# Patient Record
Sex: Female | Born: 1965 | Race: White | Hispanic: No | Marital: Married | Smoking: Never smoker
Health system: Southern US, Community
[De-identification: ages and names within clinical notes are randomized; demographics above are authoritative.]

## PROBLEM LIST (undated history)

## (undated) DIAGNOSIS — F410 Panic disorder [episodic paroxysmal anxiety] without agoraphobia: Secondary | ICD-10-CM

## (undated) DIAGNOSIS — I10 Essential (primary) hypertension: Secondary | ICD-10-CM

## (undated) DIAGNOSIS — R569 Unspecified convulsions: Secondary | ICD-10-CM

## (undated) DIAGNOSIS — F419 Anxiety disorder, unspecified: Secondary | ICD-10-CM

## (undated) DIAGNOSIS — F409 Phobic anxiety disorder, unspecified: Secondary | ICD-10-CM

## (undated) DIAGNOSIS — D66 Hereditary factor VIII deficiency: Secondary | ICD-10-CM

## (undated) DIAGNOSIS — J449 Chronic obstructive pulmonary disease, unspecified: Secondary | ICD-10-CM

## (undated) HISTORY — PX: ABDOMINAL HYSTERECTOMY: SHX81

## (undated) HISTORY — PX: OOPHORECTOMY: SHX86

---

## 2001-03-27 ENCOUNTER — Emergency Department (HOSPITAL_COMMUNITY): Admission: EM | Admit: 2001-03-27 | Discharge: 2001-03-28 | Payer: Self-pay | Admitting: *Deleted

## 2003-04-05 ENCOUNTER — Emergency Department (HOSPITAL_COMMUNITY): Admission: EM | Admit: 2003-04-05 | Discharge: 2003-04-05 | Payer: Self-pay | Admitting: Internal Medicine

## 2005-07-23 ENCOUNTER — Inpatient Hospital Stay (HOSPITAL_COMMUNITY): Admission: EM | Admit: 2005-07-23 | Discharge: 2005-07-31 | Payer: Self-pay | Admitting: Emergency Medicine

## 2005-07-24 ENCOUNTER — Ambulatory Visit: Payer: Self-pay | Admitting: *Deleted

## 2005-08-09 ENCOUNTER — Inpatient Hospital Stay (HOSPITAL_COMMUNITY): Admission: EM | Admit: 2005-08-09 | Discharge: 2005-08-14 | Payer: Self-pay | Admitting: Emergency Medicine

## 2006-03-20 ENCOUNTER — Emergency Department (HOSPITAL_COMMUNITY): Admission: EM | Admit: 2006-03-20 | Discharge: 2006-03-20 | Payer: Self-pay | Admitting: Emergency Medicine

## 2006-10-19 ENCOUNTER — Ambulatory Visit (HOSPITAL_COMMUNITY): Admission: RE | Admit: 2006-10-19 | Discharge: 2006-10-19 | Payer: Self-pay | Admitting: Family Medicine

## 2007-04-05 ENCOUNTER — Emergency Department (HOSPITAL_COMMUNITY): Admission: EM | Admit: 2007-04-05 | Discharge: 2007-04-05 | Payer: Self-pay | Admitting: Emergency Medicine

## 2007-04-11 ENCOUNTER — Emergency Department (HOSPITAL_COMMUNITY): Admission: EM | Admit: 2007-04-11 | Discharge: 2007-04-11 | Payer: Self-pay | Admitting: Emergency Medicine

## 2007-04-21 ENCOUNTER — Inpatient Hospital Stay (HOSPITAL_COMMUNITY): Admission: EM | Admit: 2007-04-21 | Discharge: 2007-04-26 | Payer: Self-pay | Admitting: Emergency Medicine

## 2007-04-22 ENCOUNTER — Encounter: Payer: Self-pay | Admitting: Orthopedic Surgery

## 2007-11-16 ENCOUNTER — Emergency Department (HOSPITAL_COMMUNITY): Admission: EM | Admit: 2007-11-16 | Discharge: 2007-11-16 | Payer: Self-pay | Admitting: Emergency Medicine

## 2008-06-10 ENCOUNTER — Encounter: Payer: Self-pay | Admitting: Orthopedic Surgery

## 2008-07-13 ENCOUNTER — Ambulatory Visit: Payer: Self-pay | Admitting: Orthopedic Surgery

## 2008-07-13 DIAGNOSIS — M1710 Unilateral primary osteoarthritis, unspecified knee: Secondary | ICD-10-CM

## 2008-07-13 DIAGNOSIS — IMO0002 Reserved for concepts with insufficient information to code with codable children: Secondary | ICD-10-CM | POA: Insufficient documentation

## 2008-07-13 DIAGNOSIS — M25569 Pain in unspecified knee: Secondary | ICD-10-CM | POA: Insufficient documentation

## 2010-09-12 ENCOUNTER — Emergency Department (HOSPITAL_COMMUNITY)
Admission: EM | Admit: 2010-09-12 | Discharge: 2010-09-12 | Payer: Self-pay | Source: Home / Self Care | Admitting: Emergency Medicine

## 2010-10-16 ENCOUNTER — Encounter: Payer: Self-pay | Admitting: Family Medicine

## 2010-12-05 LAB — URINE MICROSCOPIC-ADD ON

## 2010-12-05 LAB — DIFFERENTIAL
Basophils Absolute: 0 10*3/uL (ref 0.0–0.1)
Lymphocytes Relative: 36 % (ref 12–46)
Monocytes Absolute: 0.4 10*3/uL (ref 0.1–1.0)
Neutro Abs: 2.1 10*3/uL (ref 1.7–7.7)

## 2010-12-05 LAB — COMPREHENSIVE METABOLIC PANEL
Albumin: 3.3 g/dL — ABNORMAL LOW (ref 3.5–5.2)
BUN: 31 mg/dL — ABNORMAL HIGH (ref 6–23)
Chloride: 96 mEq/L (ref 96–112)
Creatinine, Ser: 2.22 mg/dL — ABNORMAL HIGH (ref 0.4–1.2)
GFR calc non Af Amer: 24 mL/min — ABNORMAL LOW (ref >60)
Total Bilirubin: 0.6 mg/dL (ref 0.3–1.2)

## 2010-12-05 LAB — URINALYSIS, ROUTINE W REFLEX MICROSCOPIC
Glucose, UA: >1000 mg/dL — AB
Ketones, ur: NEGATIVE mg/dL
Protein, ur: NEGATIVE mg/dL
Urobilinogen, UA: 0.2 mg/dL (ref 0.0–1.0)

## 2010-12-05 LAB — CBC
MCH: 27.8 pg (ref 26.0–34.0)
MCV: 85.5 fL (ref 78.0–100.0)
Platelets: 152 10*3/uL (ref 150–400)
RBC: 4.06 MIL/uL (ref 3.87–5.11)
RDW: 13.2 % (ref 11.5–15.5)

## 2010-12-05 LAB — POCT PREGNANCY, URINE: Preg Test, Ur: NEGATIVE

## 2010-12-05 LAB — PROTIME-INR: INR: 1.02 (ref 0.00–1.49)

## 2010-12-05 LAB — GLUCOSE, CAPILLARY: Glucose-Capillary: 113 mg/dL — ABNORMAL HIGH (ref 70–99)

## 2010-12-05 LAB — LIPASE, BLOOD: Lipase: 57 U/L (ref 11–59)

## 2011-02-07 NOTE — H&P (Signed)
NAMEMECHEL, [name]               ACCOUNT NO.:  [id number]   MEDICAL RECORD NO.:  [id number]          PATIENT TYPE:  INP   LOCATION:  IC03                          FACILITY:  [facility]   PHYSICIAN:  Osvaldo Shipper, MD     DATE OF BIRTH:  08/27/1966   DATE OF ADMISSION:  04/21/2007  DATE OF DISCHARGE:  LH                              HISTORY & PHYSICAL   PRIMARY CARE PHYSICIAN:  [facility].   PSYCHIATRIST:  Unknown at this time.   ADMISSION DIAGNOSES:  1. Persistent hypoglycemia, likely secondary to excessive insulin in      the setting of acute renal failure.  2. Hypotension likely from dehydration.  3. Acute renal failure likely from prerenal azotemia and dehydration.  4. History of bipolar disorder.  5. History of sleep apnea.  6. History of hypertension.   CHIEF COMPLAINT:  I fell.   HISTORY OF PRESENT ILLNESS:  The patient is a 45 year old Caucasian  female who unfortunately is quite somnolent at this time.  She does wake  up on oral stimuli and after tactile stimulus; however, it is very  difficult to keep her up to obtain any history.  According to her  husband and daughter, who are in the room with her, she apparently fell  last night on both her knees and has been complaining of pain in the  right knee.  The patient apparently has not had other problems in the  last few days.  She was apparently in [facility], admitted from  July 18 to July 21 and was admitted for lethargy, weakness, and ataxia.  The physicians at [facility] felt this was secondary to medications.  She  did have a CT and MRI apparently which did not show any acute  intracranial process.   According to the daughter, this morning the patient was found to have  extremely low blood sugar of 37.  EMS was called, and they detected a  blood sugar of 27, gave her an amp of D50 and brought her into the  hospital.  Here, she did receive 2 ampules of D50, but her blood sugars  have been  consistently dropping down into the 50s and the low 70s.   Once again, entire history is not obtainable at this time because of  patient's mental status.   MEDICATIONS AT HOME:  Please note, this list may not be entirely  accurate, but we did corroborate it with the Discharge Medication List  as well as the H&P from [facility].  She is apparently on:  1. Depakote 2500 mg at bedtime.  2. Klonopin 1 mg 2 times a day and 0.5 mg as needed.  3. Gabapentin 300 mg q.a.m. and 400 mg nightly.  4. Hydrochlorothiazide 25 mg once a day.  5. Toprol XL 50 mg once a day.  6. Oxybutynin 5 mg once a day.  7. Abilify 30 mg once a day.  8. Isosorbide mononitrate 60 mg once a day.  9. Enteric-coated aspirin 81 mg once a day.  10.Calcium 600 mg b.i.d.  11.Glipizide 10 mg b.i.d.  12.Lisinopril 10  mg daily.  13.Januvia 100 mg once a day.  14.Lithium 600 mg nightly.  15.Seroquel 200 mg at bedtime.  16.Lantus insulin 85 units nightly.  17.Multivitamins 1 tablet daily.  18.TriCor 145 mg daily.  19.Pravastatin 40 mg daily.  20.Advair Diskus 250/50 one puff b.i.d.   She is on home O2.   ALLERGIES:  No known drug allergies.   PAST MEDICAL HISTORY:  Positive for:  1. Bipolar disorder.  2. CHF.  3. COPD.  4. Diabetes.  5. Dyslipidemia.  6. Hypertension.  7. Panic attacks.  8. Seizure disorder.  9. Morbid obesity.  10.Obstructive sleep apnea.   PAST SURGICAL HISTORY:  Includes hysterectomy and oophorectomy.   SOCIAL HISTORY:  Lives with her husband and daughter.  Denies smoking,  alcohol use, and illicit drug use.   FAMILY HISTORY:  Positive for diabetes and colon cancer.   REVIEW OF SYSTEMS:  Unable to do because of mental status.   PHYSICAL EXAMINATION:  VITAL SIGNS: Temperature 98.9, blood pressure was  120/21 to begin with, dropping down to 84/62, has improved to 100s/50s  systolic.  Heart rate in the 90s and regular.  Respiratory rate is about  20. Saturation varies from 90-100%  on 2 liters by nasal cannula. She  does tend to become hypoxic at times, especially when she is snoring.  GENERAL:  This is a morbidly obese female, very somnolent, easy to  arouse but goes right back to sleep.  Appears to be in no distress or  discomfort.  HEENT:  No pallor, no icterus.  Mucous membranes moist.  Pupils are  normal size and  reactive.  NECK:  Soft and supple.  No thyromegaly is appreciated.  Neck is fairly  obese, though.  LUNGS:  Clinically clear to auscultation bilaterally.  No wheezes,  rales, or rhonchi.  ABDOMEN:  Obese, nontender, nondistended.  CARDIAC:  S1,  S2 normal, regular.  No murmurs appreciated.  EXTREMITIES:  Left knee has full range of motion.  Right knee I am  unable to flex fully because of pain.  Peripheral pulses difficult to  palpate.  NEUROLOGIC:  When she wakes up, she is able to move her arms and legs,  appears to have good strength, though full neurologic exam is difficult  to do.  Cranial nerves appear to be intact.   STUDIES:  CBC reveals normal white count, normal hemoglobin, normal  platelet count, slight neutrophilia on the differential.  BMET reveals  potassium was normal, bicarb normal, glucose 124 when drawn, BUN 108,  creatinine 6.2.  Her baseline liver functions close to normal.  LFTs are  not available.  Calcium is 9.3.  Lithium level was 1.2 which is in the  therapeutic range.  Valproic acid level is 51 which is also not in the  toxic range.  Urine drug screen positive for benzodiazepines, otherwise  unremarkable.  UA has not been done.   No imaging studies have been done.   ASSESSMENT:  This is a 45 year old Caucasian female, who is morbidly  obese, who has numerous medical problems as stated above, who presents  to the hospital after being found to be hypoglycemic at home.  Blood  sugars have been refractory to multiple D50s.  She was also found to  have acute renal failure, and she has alteration in her mental status.  All  of the above predisposes her to be admitted to the hospital.  She  was also transiently hypotensive a little while ago in the ED.  PLAN:  1. Hypoglycemia.  I will put her on a D10 drip to see if that helps      bring her blood sugar up.  Monitor CBGs q. 1 h for now until      consistently have 3 CBGs more than 150 without assistance from      being given D50.  Once the CBGs are consistently above 150, we can      set her IV fluids.  Obviously, we will hold her antidiabetic      medications.  2. Hypotension, likely from dehydration.  There is no evidence for any      blood loss.  We will check cardiac enzymes.  We will check a chest      x-ray also.  Give her some fluid boluses to see if the blood      pressure will stay up.  We will monitor in the ICU at least      overnight.  I do not suspect sepsis as a cause of her hypotension;      white count is normal, and she is afebrile.  3. Acute renal failure, likely from dehydration.  There has been no      mention of any nausea, vomiting, or diarrhea at home, so I am not      sure what the etiology for this renal failure is.  Poor p.o. intake      could have done this.  She did have a similar presentation a couple      of years ago, and she does have a renal function deteriorating      rather quickly.  A UA will be sent.  If her renal function does not      improve, we will have to get an ultrasound done.  In the meantime,      we will also place a Foley catheter and make sure she is indeed      making urine.  4. Altered mental status possibly from all of her metabolic      derangements as well as from one of her psychotropic medications.      I am going to hold most of them except for the Depakote which she      might be taking for seizures, and we definitely do not want her to      have any seizures in this hospital.  We will hold all of her      antihypertensive agents as well as all of her hypoglycemic agents      as discussed earlier.   She will be monitored in the ICU closely.      CPAP will be used to provide treatment for sleep apnea.   Further management decisions will be based on results of above and  patient response to treatment.  If needed, a nephrology consultation  will be obtained tomorrow morning.     Osvaldo Shipper, MD  Electronically Signed    GK/MEDQ  D:  04/21/2007  T:  04/21/2007  Job:  [id number]

## 2011-02-07 NOTE — Group Therapy Note (Signed)
[name], [name]               ACCOUNT NO.:  [id number]   MEDICAL RECORD NO.:  [id number]          PATIENT TYPE:  INP   LOCATION:  A226                          FACILITY:  [facility]   PHYSICIAN:  Dorris Singh, DO    DATE OF BIRTH:  03/05/1966   DATE OF PROCEDURE:  DATE OF DISCHARGE:                                 PROGRESS NOTE   This is ICU day # 4 for patient.   Subjectively the patient stated that last night she did not sleep well  however denies any episodes of emesis.  This morning in her Foley  catheter she had about 2000 cc of urine output compared to yesterday  which she had 4000 cc.  The patient denies any other problems or  concerns.  Blood sugars in about the high 150's to 180's currently  today.   PHYSICAL EXAMINATION:  VITALS:  Currently heart rate is 79, pulse ox  95%, respirations 19, blood pressure 119/62. The patient currently is on  2 liters of 02.  HEART:  S1, S2 present.  Regular rate and rhythm.  No murmurs.  LUNGS:  Decreased diminished breath sounds.  CHEST:  Symmetrical.  ABDOMEN:  Soft, nontender, nondistended, pendulous, is round.  Bowel  sounds present.  No masses or organomegaly.  EXTREMITIES:  Pedal edema bilaterally.  Peripheral pulses are palpable.  The patient also has a Foley cath that is draining yellow urine.  NEUROLOGIC:  The patient is appropriate questions.   LABORATORY DATA:  CBC, WBC 8, hemoglobin 11.3, hematocrit 35.2,  platelets are 175.  CMP, sodium 142, potassium 5.1, chloride 104, C02 is  32, glucose 150, BUN 21, creatinine 1.30, calcium 9.3.  UA was done  today with color amber, appears hazy.  Glucose was 1000, __________ was  large, protein 100, leukocytes moderate and nitrate negative.   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  We will go ahead and start patient on      Lantus today, starting with a comparable dose to what she takes at      home and will also monitor her lithium levels.  We will check the      level  today and I see it has been ordered by Nephrology to be      checked tomorrow.  2. Diabetes.  Her blood sugars are still running in the 200's and we      will go ahead and start Lantus as mentioned before.  3. Acute renal failure.  Dr. Barnetta Hammersmith  who is filling in for      Dr.Befakadu has seen her and has recommended certain tests to be      done.  4. Pulmonary issues.  Patient does have sleep apnea.  We will continue      with the bilevel positive airway while she is sleeping.  5. Altered mental status according to the nurse and her social worker      that saw patient today, her outpatient social worker states this is      her baseline today.  6. History of bipolar.  The patient is improved.  We will discontinue      to monitor.   I think the patient will be able to be transferred out of the ICU within  1-2 days and possibly looking for discharge within 2-3 days.      Dorris Singh, DO  Electronically Signed     CB/MEDQ  D:  04/24/2007  T:  04/25/2007  Job:  [id number]

## 2011-02-07 NOTE — Discharge Summary (Signed)
[name], [name]               ACCOUNT NO.:  [id number]   MEDICAL RECORD NO.:  [id number]          PATIENT TYPE:  INP   LOCATION:  A226                          FACILITY:  [facility]   PHYSICIAN:  Dorris Singh, DO    DATE OF BIRTH:  07/17/1966   DATE OF ADMISSION:  04/21/2007  DATE OF DISCHARGE:  07/31/2008LH                               DISCHARGE SUMMARY   ADMISSION DIAGNOSES:  1. Persistent hypoglycemia, likely secondary to excessive insulin in      the setting of acute renal failure.  2. Hypotension, likely from dehydration.  3. Acute renal failure likely from prerenal azotemia and dehydration.  4. History of bipolar disorder.  5. History of sleep apnea.  6. History of hypertension.   DISCHARGE DIAGNOSES:  1. Acute renal failure.  2. History of bipolar disorder.  3. History of sleep apnea.  4. History of hypertension.   SERVICE:  Patient was admitted to the services of [facility].  Referring  physician is [facility].   CONSULTATIONS:  None.   TESTS:  Radiology:  On July 27 patient had a portable chest x-ray which  demonstrated questionable mild pulmonary vascular congestion, diminished  aeration with some basilar atelectasis.  On July 28 the patient had a  right portable knee two views that showed, accounting for body habitus  portable technique, no displaced fracture seen, consider followup  portable technique if symptoms persist, moderate subpatellar effusion,  tricompartmental degenerative change.  On July 29 the patient had an  acute abdomen that demonstrated no acute abnormalities, intra-abdominal  assessment limited by patient's size.   HISTORY AND PHYSICAL:  Patient is a 45 year old Caucasian female who was  seen in the emergency room and was quite somnolent at the time.  She did  not wake up after oral and tactile stimulus when she was seen in the  emergency room.  The patient apparently began having problems a couple  of days ago and was brought  by EMS for lethargy, weakness and ataxia.  She was seen at [facility] and was admitted from July 18 to 21st.  It was  felt this was secondary to her medications.  She did have a CT done but  it did not show any acute intracranial process.  According to her  daughter, she was found on July 27 with a blood sugar of 37.  EMS was  called and brought her to the emergency room where she was given an amp  of D50 at that point in time.  Her labs upon admission demonstrated that  she had a normal white count, normal hemoglobin, normal platelet count,  slight neutropenia.  Her BMET showed normal bicarb, glucose was 124, BUN  was 108, creatinine was 6.2.  Her baseline liver functions were normal.  Her lithium level was 1.2, which is within therapeutic range, and her  valproic level was 51.  Her drug screen was positive for benzodiazepine.  At that point in time she was admitted with hypoglycemia and started on  a D10 drip to bring her blood sugars up and was also put in the ICU  to  have continuous monitoring and for dehydration.  Patient was hydrated to  help reverse the acute renal failure.   HOSPITAL COURSE:  Her mental status improved and her hypoglycemia was  corrected.  On 02/28 the patient was also seen by PPO team and also had  a PICC line placed at that point in time.  She was also seen by  nutritional assessment, which talked to her about maintenance of  glycemic control and preserving a leaner body mass and optimize diet  balance currently.  She was seen by physical therapy also on the 29th in  which she was getting up in the chair and it has been felt that she  should not need much more PT, she was doing fine.  Patient then was  complaining on 07/29 of multiple episodes of emesis over the night.  It  was found that her I's and O's were negative at about 0.4 L, and she  made 9 L of urine yesterday.  There was concern that she may have  possible diabetes insipidus.  She had had an enormous  amount of urine  output at that point in time.  The lithium level was normal, but it was  thought that lithium may be the culprit of her increase in urination and  possible diabetes insipidus, so it was recommended by Dr. __________  that she stop her lithium and to continue to treat the diabetes  insipidus.  Also her acute renal failure was improving, and it was found  that patient does have severe sleep apnea, and she was put on bilevel  positive airway pressure while she was sleeping, and this seemed to help  her get comfortable.   Altered mental status:  Patient was visited by her social worker who  sees her at home, and it was noted that this was her baseline.  History  of bipolar disorder.  She was given Ativan for any kind of agitation  while she was in the emergency room.   Nephrology followed her BUN and creatinine, and it was found on 07/30  that her prerenal azotemia was resolving.  However, she now has probable  diabetes insipidus.  It was thought that they would hold her Lantus and  that she would probably need a documentation of this and a  differentiation of nephrogenic versus central diabetes insipidus with  water deprivation test with administration of DDAVP.  This would need to  be done sometime off lithium or on her regular dose in order to find a  definite effect if it is the lithium.  It was recommended at this point  in time that she be kept off lithium because of the adverse consequences  demonstrated to date and that they would also check a serum osmolality  and definitely recommend that the patient avoid any diuretics at home.   On 07/30 the patient was also visited by the diabetes treatment program  and given recommendations on how to improve her diabetic control.  Patient was still in the ICU on 07/30.  It was found that her polyuria  had decreased.  Patient was feeling better.  At that point in time it  was felt that the patient could be transferred from the  ICU today with  telemetry.  On 07/31 the patient continued to improve and it was thought  that patient could be discharged to home with followup with her current  primary care physician, also on certain medications to keep her from  having  another hypoglycemic event.   Medications that she will be sent home on are lisinopril 10 mg p.o.  daily, Seroquel 200 mg p.o. at bedtime, Lantus 20 mg subcu nightly; this  has been changed from her 85 units that she was getting before;  multivitamin one tablet p.o. daily, pravastatin 40 mg one p.o. daily,  Tri-Cor 145 mg p.o. daily, Advair Diskus 250/500 one puff two times a  day, Klonopin 0.5 mg p.o. p.r.n., isosorbide MN 60 mg; there is no  schedule on this.  This medication has been discontinued, which is  lithium 300 mg p.o. two tablets at bedtime; she will not take this.  Gabapentin 300 mg p.o. three tablets in the morning and four tablets at  bedtime, she will continue with that.  Depakote 250 mg p.o. q.h.s.,  Klonopin 1 mg p.o. t.i.d.  She also has on her med rec sheet two  different orders for Klonopin, but we will only do the Klonopin 0.5 mg.  She has Klonopin 0.5 mg repeated on her med sheet and gabapentin 400 mg  repeated on her med sheet.  She is also on HCTZ 25 mg p.o. daily; will  stop that.  Toprol XL 500 mg one p.o. daily; will continue that.  Oxybutynin 5 mg p.o. daily; will continue that.  Abilify 30 mg p.o.  daily; continue that.  Enteric-coated aspirin 81 mg p.o. daily; continue  that.  Calcium 60 mg p.o. two times a day; continue.  Glipizide 100 mg  p.o. two times daily, and we will decrease her Januvia from 100 mg p.o.  daily to 50 mg p.o. daily.   DISPOSITION:  Stable to discharge to home.  Husband will pick her up.  She may be discharged, if not today on 07/31, she will be discharged on  08/01 due to ride.   INSTRUCTIONS:  She has no activity restrictions.   Her diet should be a diabetic diet.   Patient will need to  follow up with her primary care physician, I have  here [facility].  Would like her to see them within 1-2  days.      Dorris Singh, DO  Electronically Signed     CB/MEDQ  D:  04/25/2007  T:  04/26/2007  Job:  [id number]

## 2011-02-07 NOTE — Consult Note (Signed)
NAMEMADYN, IVINS NO.:  [id number]   MEDICAL RECORD NO.:  [id number]          PATIENT TYPE:  INP   LOCATION:  IC03                          FACILITY:  [facility]   PHYSICIAN:  Cyndra Numbers, MD DATE OF BIRTH:  [birth year]   DATE OF CONSULTATION:  04/24/2007  DATE OF DISCHARGE:                                 CONSULTATION   Arijana Narayan is a 45 year old white female, whom we were asked to  consult for evaluation of diabetes insipidus.   Milton Streicher is a 45 year old white female with a history of diabetes  mellitus and bipolar disorder, admitted to the hospital with acute  prerenal azotemia with a creatinine of approximately 6.  She apparently  has had multiple admissions to more than one hospital for similar  episodes of volume depletion.   With hydration, the patient has rather marked ready improvement in her  renal function.  It has been noted during this hospitalization that the  patient's urinary output has been high.   The patient does relate to drinking adequate amounts of fluid at home.  She drinks predominantly water out of a large container of which she  drinks what sounds like three or four quarts, at least, per day.   The patient has been maintained on lithium therapy for bipolar disorder.  She is unable to tell me how long she has been on this medication, but  it sounds like it is a long-term medication for her.   According to the patient's chart, she was on hydrochlorothiazide at  home, as well.   PAST MEDICAL HISTORY:  Remarkable for bipolar disorder, history of  congestive heart failure, COPD, obstructive sleep apnea, type 2  diabetes, dyslipidemia, hypertension, panic attacks, seizure disorder,  morbid obesity.   HOME MEDICATIONS:  She is apparently maintained on home oxygen.  Her  home medications are as listed in the chart, as far as dosages, but  include Depakote, Klonopin, gabapentin, hydrochlorothiazide, Toprol,  Oxybutynin,  Abilify, isosorbide mononitrate, enteric coated aspirin 81  mg, calcium, glipizide, lisinopril, Januvia, lithium, Seroquel, Lantus  insulin, multivitamins, Tricor, pravastatin, Advair Diskus.   She denies tobacco, alcohol or drug use.  She lives at home with her  husband and daughter, has assistance from home health nurses and  community long-term care.   REVIEW OF SYSTEMS:  At this time, negative for shortness of breath or  chest pain.  She denies GI complaints.  She does not feel particularly  thirsty at this point in time.  She generally has a negative review of  systems and her orientation is marginal.   PHYSICAL EXAM:  At this time, reveals a well-developed, morbidly obese,  white female, sitting quietly at the bedside.  VITAL SIGNS:  At this time reveal the blood pressure at 110/70.  Heart  rate was in the 80s and regular, respirations are unlabored.  She has  been afebrile.  HEENT EXAMINATION:  Revealed an atraumatic cranium.  Patient is  anicteric.  Buccal mucosa was moist.  NECK:  Supple without JVD or thyromegaly.  CHEST:  Clear to auscultation and percussion.  She had a regular heart  rhythm without murmurs, gallops or rubs.  ABDOMEN:  Soft, obese.  Bowel sounds are normal.  There is no  organomegaly, mass or flank tenderness appreciated.  EXTREMITIES:  Remarkable for no appreciable edema.  Feet are warm with  palpable pulses.  NEUROLOGICALLY:  The patient had somewhat pressured speech.  Orientation  was good.  Memory was poor.  She was nonfocal on motor exam.   LABORATORY DATA:  Reviewed.   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      Upwards of 30% of patients on chronic lithium use will develop a      renal concentrating defect or frank diabetes insipidus, which is      generally reversible on discontinuation of lithium therapy.  In      this patient with recurrent episodes of volume contraction in the       setting of free access to water and fluids, the diagnosis of      diabetes insipidus, I believe, is likely.  Full documentation of      diabetes insipidus and differentiation of nephrogenic, versus      central diabetes insipidus would require water deprivation test      with administration of DDAVP.  This would need to be done after      some period of time off lithium or on her regular dose in order to      define the effect of lithium in this situation.  Would recommend,      at this point, a time if the patient could be left off lithium and      she stayed off lithium because of adverse consequences demonstrated      to date.  We will follow clinically and we will check serum urine      osmolalities.  Would certainly avoid diuretics at home.  2. Prerenal azotemia, resolved.   I would like to thank the [facility] physicians very kindly for  entrusting me to see this patient.  We will continue to follow and make  further suggestions.           ______________________________  Cyndra Numbers, MD     WRB/MEDQ  D:  04/24/2007  T:  04/24/2007  Job:  [id number]   cc:   Jorja Loa, M.D.  Fax: [phone]

## 2011-02-07 NOTE — Group Therapy Note (Signed)
[name], [name]               ACCOUNT NO.:  [id number]   MEDICAL RECORD NO.:  [id number]          PATIENT TYPE:  INP   LOCATION:  IC03                          FACILITY:  [facility]   PHYSICIAN:  Osvaldo Shipper, MD     DATE OF BIRTH:  [birth year]   DATE OF PROCEDURE:  04/23/2007  DATE OF DISCHARGE:                                 PROGRESS NOTE   This is day number 3 in the intensive care unit.   Subjectively, the patient is complaining of multiple episodes of emesis  over the course of the night and this morning.  She denies any abdominal  pain, denies any headaches.   Objectively, in's and out's, it appears she was negative at 0.4 liters.  Negative already by 2.5 liters today.  She made 9 liters of urine  yesterday.   PHYSICAL EXAMINATION:  VITAL SIGNS:  Temperature 99.2 this morning,  heart rate in the 100-110 range, a few PVCs, blood pressure 127/72,  respiratory rate about 20, saturation 95% on BiPAP.  She is about 93-94%  on 2 liters.  She has a PICC line placed now.  LUNGS:  Crackles actually at the right base, not so much at the left  base.  It is somewhat decreased with deep breathing.  CARDIOVASCULAR:  S1 and S2.  Slightly tachycardic.  Regular.  No murmurs  appreciated.  ABDOMEN:  Obese, nontender, nondistended.  Bowel sounds are present.  No  masses or organomegaly is appreciated.  EXTREMITIES:  Pedal edema.  Peripheral pulses are palpable.  GU:  She has a Foley catheter draining clear yellow urine.  NEUROLOGIC:  Mental status:  Still tends to be somnolent but much better  than the past 2 days.   LABORATORY DATA:  Her CBC pretty stable.  BMET shows that her sodium has  gone up to 148, potassium 5.7, chloride 109, bicarb 36, glucose 151, BUN  improved to 43, creatinine improved to 1.6, calcium 9.7.  The lithium  level from 2 days ago was 1.2.   Abdominal film was done earlier today.  There is no reading on it yet,  but I did not appreciate any free air.  We will still  await radiology  report.   ASSESSMENT AND PLAN:  1. 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  The patient has made a tremendous      amount of urine since yesterday.  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  The reason for this is not clear.  She is on a lot of      psychotropic medications which could be doing this.  She did have a      CT/MRI recently at [facility] which did not show any intracranial      abnormalities.  Her lithium level was okay, and she has not been      continued on lithium.  I do not know if this can happen when      someone goes off of lithium.  I am going to repeat another stat      lithium level just to be sure that the levels  are not high.  I am      going to request Dr. Kristian Covey to see this patient.  Increase      intravenous fluids for now to 150 mL per hour and then see what his      recommendations are.  I will repeat a BMET at 4 p.m.  She has      already received some Kayexalate for her hyperkalemia.  2. Diabetes.  Her blood sugars are running now in the 200s.  She is      just on sliding scale for now.  We have held the Lantus and other      oral hyperglycemic agents because she was actually hypoglycemic      when she presented.  I think if her blood sugars remain      consistently high, she would benefit from being started on some      Lantus at least at a lower dose.  Her hemoglobin A1c was 5.8      implying decent control of her diabetes.  3. Acute renal failure.  It is getting better.  We thought it was      secondary to treating her azotemia.  As I discussed under #1, Dr.      Kristian Covey will be consulted.  4. Pulmonary issues.  She does have severe sleep apnea and likely      obesity hypoventilation syndrome.  We will continue the bilevel      positive airway pressure when she is sleeping.  She is requiring      oxygen at this time.  We will continue current management for this      patient.  5. Altered mental status.  Likely secondary to medications and  the      above issues.  We will continue to follow her for now.  6. History of bipolar disorder.  She was a little bit agitated      overnight for which she was given Ativan.  She has improved at this      point.  7. History of seizure disorder.  Stable.  8. Hypotension has improved.  I think we can go ahead and start her      Toprol-XL.  9. She is on deep venous thrombosis prophylaxis.   I think at least today she needs to stay in the intensive care unit.      Osvaldo Shipper, MD  Electronically Signed     GK/MEDQ  D:  04/23/2007  T:  04/23/2007  Job:  [id number]   cc:   Jorja Loa, M.D.  Fax: [phone]

## 2011-02-10 NOTE — Discharge Summary (Signed)
[name], [name]               ACCOUNT NO.:  [id number]   MEDICAL RECORD NO.:  [id number]          PATIENT TYPE:  INP   LOCATION:  A208                          FACILITY:  [facility]   PHYSICIAN:  Calvert Cantor, M.D.     DATE OF BIRTH:  03-15-1966   DATE OF ADMISSION:  07/23/2005  DATE OF DISCHARGE:  LH                                 DISCHARGE SUMMARY   ADDENDUM:  Serum osmolality is 308, the normal being 275 to 300.  Urine osmolality is  298, normal being 390 to 1090.   The patient is diuresing about 5 to 7 liters per day.  She states that she  needs to drink fluid constantly and never leaves the house without taking  some water with her.  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  We have started her on a low  sodium diet as well as hydrochlorothiazide 25 mg daily.  Her sodium on  admission was slightly low normal at 134, however, with infusion of normal  saline during her stay, it has increased to about 137.   VITALS ON DISCHARGE:  Blood pressure is stable at 122/80, heart rate is 93,  respirations 20.  Blood sugars controlled with CBG of 68 this morning.   Patient has been resumed on all of her home medications including Advair.  She is to be discharged on a prednisone taper and her new medication which  is hydrochlorothiazide.   She is to follow up with her primary care physician in one week's time.  In  addition, the patient is being discharged with home health to provide her  with some physical therapy and daily blood pressure and blood sugar  monitoring.      Calvert Cantor, M.D.  Electronically Signed     SR/MEDQ  D:  07/31/2005  T:  07/31/2005  Job:  [id number]

## 2011-02-10 NOTE — Group Therapy Note (Signed)
NAMEMESHELL, ABDULAZIZ NO.:  [id number]   MEDICAL RECORD NO.:  [id number]          PATIENT TYPE:  INP   LOCATION:  IC08                          FACILITY:  [facility]   PHYSICIAN:  Vania Rea, M.D. DATE OF BIRTH:  10-Aug-1966   DATE OF PROCEDURE:  07/24/2005  DATE OF DISCHARGE:                                   PROGRESS NOTE   Hospital day #2   Overnight the patient has been admitted.  She remains on the ventilator. She  remains alert, requiring no sedation. This morning her case manager/social  worker came to visit her and we were also able to get in touch with her son  to get more complete medical history and her true medication list.  Son says  that he brought her to the emergency room because she has been having  blurred vision, confused, and inability to walk properly.  She does have a  history of COPD/sleep apnea and uses 3 liters of oxygen at home, but refuses  to use her CPAP machine. There has been a lot of changes to her medications  regularly, according to her social Investment banker, operational whose name is Amy  Nix.  She says that they have eventually got her medications sorted out and  she is taking them mostly by bubble pack.  However, the medication list is  formidable and will be described below.  Overnight in the intensive care  unit the patient has been kept NPO but has been receiving intravenous  dextrose, but has been running persistently hypoglycemic requiring frequent  boluses of 50% dextrose.   MEDICATION LIST:  1.  Toprol XL 100 mg daily.  2.  Lisinopril  mg daily.  3.  Ibuprofen 800 mg q.8 h. As well as Naproxen 500 mg twice daily.  These      were not bubble packed and have been administered by her care givers.  4.  Abilify 30 mg daily.  5.  Lasix 40 mg daily.  6.  Imdur 60 mg daily.  7.  Theochron 100 mg twice daily.  8.  Aspirin 81 mg daily.  9.  Calcium with D 600 mg twice daily.  10. Klonopin 1 mg 3 times daily.  11. Glyburide and  metformin combination 5/500 two tablets in the morning and      one tablet at bedtime.  12. Ditropan XL 15 mg twice daily.  13. Gabapentin 300 mg tablets 3 tablets twice daily (that is 900 mg twice      daily).  14. Prevacid 30 mg each evening.  15. Clarinex 5 mg each evening.  16. Lithium 300 mg 3 tablets at bedtime.  17. Seroquel 200 mg at bedtime.  18. Lipitor 20 mg at bedtime.  19. Depakote 250 mg 10 tablets at bedtime.   This morning the patient is lying comfortably on the ventilator.  She seems  very comfortable, is answering questions more sensibly. She denies chest  pain.  She is still not able to write legibly although her handwriting has  improved.  She still has a coarse generalized tremor.   OBJECTIVE:  VITAL SIGNS:  T-max 101.9.  Her systolic pressures are ranging  between 105 and 118.  Her diastolics range between 58 and 65.  Her pulse is  in the 70s.  She is saturating at 95% on an FIO2 of 50.  The ventilator  settings remain at 14 x 700 and she is breathing between 15 and 20.  HEENT:  Pupils are round and equal.  CHEST:  She has diffuse rhonchi.  CARDIOVASCULAR:  She has a regular rhythm.  ABDOMEN:  Obese, but there is no tenderness  EXTREMITIES:  Her extremities are without edema.   LABS:  Reveal on 60% FIO2 and PEEP of 5:  Her pH of 7.47, pCO2 of 38, pO2 of  75, O2 saturation 96%.  Her white count is normal at 7.4.  Her hemoglobin  remains stable at 9.6.  Her platelets are 218.  She has a normal  differential on her white count.  Her sodium 134, potassium 4.4, chloride  102, CO2 29, glucose 58, creatinine has fallen to 2.7; total bilirubin 0.5.  Her alkaline phosphatase remains normal.  Her AST has risen slightly to 45.  Her ALT has risen slightly to 71.  Her total protein 4.8, albumin stable at  2.7, the calcium 8.5.  Her cardiac enzymes, CK/MB, have been slightly  elevated up to 9.3 with a total CK of 777, a relative index is completely  normal.  A troponin I  rose to a 0.73 and then fell to 0.57.  Not clearly  compatible with acute myocardial injury, but will go ahead and get a  cardiology consult.  Her  Lithium level originally came back elevated at  1.52 and has fallen to 1.30.  In view of the low therapeutic window this is  still considered toxic.   ASSESSMENT:  1.  Acute respiratory failure in a setting of obstructive sleep apnea and      acute exacerbation of chronic obstructive pulmonary disease.  2.  Fever in the setting of a normal white count in an obese, diabetic lady      with multiple other medical problems.  3.  Acute renal failure in the setting of lithium toxicity with excessive      NSAID use as well as ACE inhibitors and Lasix in a patient who has been      confused about her medications.  4.  Elevated cardiac enzymes in the setting of a lady with a history of      congestive heart failure on Lasix and Imdur, but who also is now in      renal failure; and denies chest pain.  5.  History of hypertension, blood pressure now controlled in her acutely      ill state.  Not requiring pressors at this time.  6.  Lithium toxicity, improved, however, the patient still has a course      tremor. Her mentation is probably still suboptimal.  She is unable to      write properly and she is still in renal failure.  7.  Bipolar disorder.  8.  Seizure disorder with subtherapeutic Depakote levels.  9.  Persistent hypoglycemia.   PLAN:  1.  We will start tube feedings as well as increase free water by      nasogastric tube.  2.  Continue to monitor acute renal failure as advised by GI.  3.  Will consult cardiology and get a 2-D echocardiogram as requested.  4.  Will continue to titrate down her  FIO2, appreciate the input of the      pulmonary service.  5.  Because of her fevers and the difficulty in intubation, although her     blood cultures are negative, so far, will continue coverage with Zosyn.      Other plans as per  orders.      Vania Rea, M.D.  Electronically Signed     LC/MEDQ  D:  07/24/2005  T:  07/24/2005  Job:  [id number]

## 2011-02-10 NOTE — H&P (Signed)
NAMELUCIENNE, SAWYERS NO.:  [id number]   MEDICAL RECORD NO.:  [id number]          PATIENT TYPE:  EMS   LOCATION:  ED                            FACILITY:  [facility]   PHYSICIAN:  Vania Rea, M.D. DATE OF BIRTH:  04-Sep-1966   DATE OF ADMISSION:  07/23/2005  DATE OF DISCHARGE:  LH                                HISTORY & PHYSICAL   PRIMARY CARE PHYSICIAN:  Dr. Blair Promise.   CHIEF COMPLAINT:  Altered mental status and respiratory failure.   HPI:  This is a morbidly obese lady who apparently has diabetes and bipolar  disorder and seizure disorder, among other medical problem, questionable  COPD, who apparently was found by her son at home confused and was brought  to the emergency room, found to be confused, hypoxic and hypoglycemic and  eventually had to be intubated.  Currently, the patient is intubated on the  vent and appears to be alert, but on further questioning is confused and not  oriented, so unable to designate orientation to time or place.  When asked  to write answers to questions, she writes unintelligible information.  The  hospitalist service has been called to assist in management.  Patient's  family have left and we are unable to contact them on the phone number that  they have left and there is no clear history of what precipitated the  attacks.  Patient says she was having an attack of her COPD and that she  does have a past history of intubation, but her history is quite unreliable.  Her blood work shows evidence of respiratory acidosis, but the medication  list that we have for her does not include any medications related to COPD,  but it does include high dose Toprol.   PAST MEDICAL HISTORY:  Unable to obtain clearly, but apparently does  include:  1.  Bipolar disorder.  2.  CHF.  3.  Diabetes.  4.  Seizures.   MEDICATIONS:  Unable to confirm the present list, but includes Lasix, Imdur,  Depakote, Lithium, Clonazepam,  Prevacid, lisinopril, Toprol XL, Theocon,  aspirin, Vitamin D, metformin, glyburide, Ditropan, Clarinex, Lipitor and  gabapentin.   ALLERGIES:  NO KNOWN ALLERGIES.   SOCIAL HISTORY:  Unable to obtain other than above.   FAMILY HISTORY:  Unable to obtain.   REVIEW OF SYSTEMS:  Unable to obtain.   PHYSICAL EXAM:  This is a morbidly obese middle-aged Caucasian lady lying on  the stretcher intubated, completely awake but does not appear to be oriented  to place or time. She has a generalized jitteryness.  VITALS:  Initially on presentation in the emergency room, her temperature  was 99.1, her pulse 99, her blood pressure 74/22 and the respiration was 22.  She was saturating at 82%.  Currently, patient is intubated on the vent, her  systolic pressure is 102, her pulse is 79.  Pupils are round, equal and reactive.  Mucous membranes are pink.  She has a  thick with no obvious lymphadenopathy.  There is blood around her mouth,  probably from a traumatic intubation that  required 3 attempts.  There is no  obvious thyromegaly.  There is no obvious jugular venous distention.  CHEST:  She has good air entry bilaterally.  There is no wheezing or rhonchi  or crackles.  CARDIOVASCULAR SYSTEM:  She has distant heart sounds, no murmurs are heard.  There is no irregularity.  ABDOMEN:  Is morbidly obese.  There is no tenderness and no masses felt.  EXTREMITIES:  There is trace of edema bilaterally.  There is no bony joint  deformity.  CENTRAL NERVOUS SYSTEM:  She has no focal neurologic deficit.  Her cranial  nerves, to the extent that can be examined, appear to be fully intact.  Generalized coarse tremor.   LABS:  Her initial ABG on 4 liters:  Her pH was 7.24, pCO2 was 78, pO2 was  69, saturating at 90% on 100% with a tidal volume of 700 and a peep of 5.  Her pH was 7.4, pCO2 42, pO2 186, saturating at 99%.  Currently she is on  ACF 14 x 70 with 60% FIO2 and she is saturating at 100% and appears to  be  comfortable.  Her white count was 8.1, hemoglobin 9.8, hematocrit 30, her  MCV 88, her platelets were 243.  She had a normal distribution on her white  count.  Her sodium was 134, potassium slightly elevated at 5.5, chloride 99,  CO2 28, glucose 46, BUN 76.  Her creatinine is elevated at 3.8.  Her liver  function test is significant for a bilirubin of 0.8, alk phos of 50, AST 41  and ALT 57.  Her total protein is 5.1 and albumin 2.9.  Her calcium is 8.4.  Her first set of cardiac markers were negative except for a myoglobin over  500.  Her CK MB was 3.2, but a total CK has not been done.  The valproic  acid level is subtherapeutic at 18.  She has not detectible acetaminophen or  salicylate.  The urine drug screen was positive for benzodiazepines but  negative for cocaine or any other abnormal constituents.  Her urinalysis was  bland.  Her chest x-ray showed no evidence of acute infiltrate, although the  left side, to my reading, does appear hazy.  Her EKG is pending.   ASSESSMENT:  1.  This is a lady presenting with confusion, hypoglycemia and respiratory      failure with multiple issues which could be leading to this state.  Her      respiratory failure may be related to possible chronic obstructive      pulmonary disease, although she is confused in giving this history.  It      could be related to a CHF, or  seizure disorder.  It may even be related      to hypoglycemia.  We will manage her conservatively, will address all of      these issues and have a pulmonary consult as soon as possible.  2.  She has renal failure.  May be related to rhabdomyolysis, may be acute      or chronic.  May be related to her congestive heart failure, My be      related to her lithium,or my itself be precipitating lithium toxicity.      Her altered mental status can be a result of any of these.  In short,     will maintain her on a vent to try and titrate down her O2.  We will  further  investigate her renal function. Will check lithium level and      CPK.  We will give her a full cardiac workup and will try to restart all      of her anti-seizure medications.  Specific plans as per orders.      Vania Rea, M.D.  Electronically Signed     LC/MEDQ  D:  07/23/2005  T:  07/23/2005  Job:  [id number]

## 2011-02-10 NOTE — H&P (Signed)
[name], [name]               ACCOUNT NO.:  [id number]   MEDICAL RECORD NO.:  [id number]          PATIENT TYPE:  INP   LOCATION:  IC06                          FACILITY:  [facility]   PHYSICIAN:  Calvert Cantor, M.D.     DATE OF BIRTH:  1966-06-01   DATE OF ADMISSION:  08/09/2005  DATE OF DISCHARGE:  LH                                HISTORY & PHYSICAL   PRIMARY CARE PHYSICIAN:  Dr. Shary Key.   CHIEF COMPLAINT:  Altered mental status and respiratory failure.   HISTORY OF PRESENT ILLNESS:  This is a 45 year old Caucasian obese female  with a past medical history of COPD, sleep apnea, bipolar disorder, seizure  disorder, diabetes mellitus, who is brought into the ER with confusion and  respiratory distress.  The patient is awake and alert and has been placed on  a BiPAP machine.  History as per the ER doctor mentions that the patient was  confused and had some slurred speech when she came in.  Currently she is  tolerating the BiPAP well.  However, she is unable to give a history.   The patient's last admission to [facility] was on July 23, 2005,  where she presented with the same complaints.  She has a history of sleep  apnea and requires a BiPAP at night.  At that time, she admitted that she  was not using her BiPAP at night, which could have predisposed her to  respiratory distress.  In addition, she has the COPD and possibly also  obesity hypoventilation syndrome. Currently she has retained a large amount  of carbon dioxide which could be leading to her confusion.  In addition, she  is hypoxic.   REVIEW OF SYSTEMS:  Unobtainable.   PAST MEDICAL HISTORY:  1.  Bipolar disorder.  2.  Diabetes mellitus.  3.  Seizure.  4.  COPD.  5.  Sleep apnea.  6.  History of CHF.   ALLERGIES:  No known allergies.   MEDICATIONS:  She was previously discharged on the following medications:  1.  Aspirin 81 mg daily.  2.  Abilify 30 mg daily.  3.  Klonopin 1 mg t.i.d.  4.   Depakote 1000 mg in the morning and 2500 mg at bedtime.  5.  Gabapentin 900 mg b.i.d.  6.  Seroquel 200 mg q.h.s.  7.  Glipizide 10 mg b.i.d.  8.  Lithium carbonate 900 mg q.h.s.  9.  Metformin 1000 mg b.i.d.  10. Toprol XL 50 mg daily.  11. Imdur 60 mg daily.  12. Levaquin 750 mg daily for 2 days.  13. A tapering dose of prednisone.  14. Zocor 40 mg q.h.s.  15. Atrovent and albuterol nebulizers p.r.n.  16. NovoLog sliding scale.   PHYSICAL EXAMINATION:  VITAL SIGNS:  Temperature 97 degrees, blood pressure  146/59, pulse 119, respiratory rate 22.  HEENT:  Atraumatic, normocephalic.  Pupils are equal, round, and reactive to  light.  LUNGS:  There are decreased breath sounds bilaterally and mild wheezing.  No  rales.  HEART:  Regular rate and rhythm, tachycardic.  No  murmurs.  ABDOMEN:  Soft, nontender, nondistended.  Bowel sounds are positive.  EXTREMITIES:  No clubbing, cyanosis, or edema.   LABORATORY DATA:  ABG shows pH 7.274, pCO2 83.7, pO2 74.9, bicarb 37.5,  oxygen saturation 92.8.   WBC count 13.8, hemoglobin 10.7, hematocrit 32.6, MCV 87.9, platelets 180.  D-dimer 0.48, sodium 132, potassium 3.3, chloride 88, bicarb 33, glucose  275, BUN 31, creatinine 2.4, total bilirubin 0.3, alkaline phosphatase 54,  AST 38, ALT 31, total protein 5.6, albumin 3.1, calcium 9.8.  First set of  point of care markers show myoglobin 303, CK-MB 4.9, troponin less than  0.05.  Lithium level is slightly elevated at 1.57.  Depakote level is normal  at 95.  Urine drug screen is positive for benzodiazepines.  Alcohol level  less than 5.  Urine is showing trace protein, 3-6 wbc's.   Chest x-ray shows normal heart size and no other acute abnormalities.   EKG shows a normal sinus rhythm at 99 beats per minute.  There are no other  acute abnormalities.   ASSESSMENT AND PLAN:  This is a 45 year old white female who is being  admitted for respiratory failure.  As currently she is awake and alert,  she  has been placed on a BiPAP.  She will be monitored on the BiPAP for about 2  hours, after which a repeat ABG will be done.  If she does show improvement,  the BiPAP will be continued throughout the night.  However, if she remains  the same or tends to decline, then she will be intubated.   For her COPD exacerbation, she has received a dose of IV Solu-Medrol by the  ER doctor.  This will be continued at 80 mg IV q.8h.  In addition, she will  have nebulizer treatment.  She will receive her BiPAP at night for  obstructive sleep apnea once her respiratory distress has stabilized.   The patient is also in acute renal insufficiency.  She was previously  discharged with a BUN and creatinine of 23 and 1.2 on November 6.  Therefore, I will hydrate her with 1/2 normal saline at 125 cc/hr, while  ensuring that she does not become fluid overloaded.   Her lithium level is elevated and therefore the lithium will be held until  her level becomes therapeutic.   She will be placed on a NovoLog sliding scale for her diabetes.  She will  receive Lovenox for DVT prophylaxis.      Calvert Cantor, M.D.  Electronically Signed     SR/MEDQ  D:  08/10/2005  T:  08/10/2005  Job:  [id number]   cc:   Dr. Shary Key

## 2011-02-10 NOTE — Group Therapy Note (Signed)
NAMEMARYLN, EASTHAM               ACCOUNT NO.:  [id number]   MEDICAL RECORD NO.:  [id number]          PATIENT TYPE:  INP   LOCATION:  IC08                          FACILITY:  [facility]   PHYSICIAN:  Edward L. Juanetta Gosling, M.D.DATE OF BIRTH:  1966-06-12   DATE OF PROCEDURE:  DATE OF DISCHARGE:                                   PROGRESS NOTE   SUBJECTIVE:  Ms. Kilgore seems overall much better. She is a little sluggish  this morning but arouses easily. She has no other new complaints.   Her physical examination this morning shows that she is awake, sluggish but  arousable. Her pulse is in the 90s, blood pressure 134/75, O2 sat is 99%,  respirations 22. Her chest is clear. Her heart is regular. Her abdomen soft.   LABORATORY WORK:  Her CBC shows white count 10,100, hemoglobin is 10.7,  platelets 244. Comp metabolic profile shows that her potassium is 5.6, BUN  24, creatinine 1.7 - much improved, and Dr. Lorin Picket has ordered a low-  potassium diet. Valproic acid level is still somewhat low at 31 and lithium  level 0.55 also somewhat low.   ASSESSMENT:  Overall she seems better __from  ________ her respiratory system failure standpoint.   My plan then is to go ahead with treatments, follow up a bit more  peripherally now, but of course I am glad to see her if you request. I will  still check on her but not follow her as actively.      Edward L. Juanetta Gosling, M.D.  Electronically Signed     ELH/MEDQ  D:  07/26/2005  T:  07/26/2005  Job:  [id number]

## 2011-02-10 NOTE — Consult Note (Signed)
NAMEBRAILYN, KILLION NO.:  [id number]   MEDICAL RECORD NO.:  [id number]          PATIENT TYPE:  INP   LOCATION:  IC08                          FACILITY:  [facility]   PHYSICIAN:  Vida Roller, M.D.   DATE OF BIRTH:  [birth year]   DATE OF CONSULTATION:  07/24/2005  DATE OF DISCHARGE:                                   CONSULTATION   PRIMARY CARE PHYSICIAN:  Dr. Laveda Abbe   HISTORY OF PRESENT ILLNESS:  Mrs. [name] is a 45 year old female with no  known coronary artery disease who was admitted with respiratory failure and  altered mental status.  She was found by her son at home, confused.  She was  brought to the emergency department where she was found to be confused,  hypoxic, hypoglycemic, and she was intubated.  She is now alert and awake.  On the ventilator, she is able to answer most questions relatively easily,  even though she cannot speak due to the intubation.  She states that she has  not had any discomfort in her chest, and no shortness of breath.  She thinks  that she may have made some mistakes with her medications, the cause of her  respiratory insufficiency.  Otherwise, she is feeling reasonably good,  currently.  Denies any problems with her heart in the past.  She does have  bipolar disorder and a seizure disorder, and she has chronic obstructive  pulmonary disease.  She has diabetes.  She lives in [place] with her  husband and her children.  She does not smoke, drink or use illicit drugs.  She quit smoking a number of years ago.  She denies any early coronary  disease in her family.  No fever, chills, weight change or adenopathy.  No  headaches, sinus tenderness, nasal discharge, bleeding or voice changes.  No  photophobia or vision problems.  No rash.  No chest pain, shortness of  breath, PND, orthopnea, lower extremity edema.  Palpitations, presyncope or  syncope.  No claudications, wheezing or coughing.  No urinary frequency or  dysuria.  No weakness or numbness.  No myalgias or arthralgias.  No nausea,  vomiting, diarrhea, bright red blood per rectum, melena, hematochezia,  odynophagia or dysphagia.  No polyuria or polydipsia, and the remainder of  her review of systems is negative.   MEDICATIONS PRIOR TO ADMISSION:  1.  Depakote 1 g in he morning and 1500 mg in the evening.  2.  Seroquel 200 mg daily.  3.  Klonopin 0.5 mg daily.  4.  Abilify 300 mg daily.  5.  Lithium 300 mg in the morning, 600 mg in the evening.  6.  Mobic 7.5 mg once daily.  7.  Clarinex 5 mg daily.  8.  Ditropan 15 mg daily.  9.  Lasix 40 mg daily.  10. Imdur 60 mg daily.  11. Theophylline 100 mg id  12. Advair one b.i.d.  13. Combivent four puffs q.i.d.  14. Multivitamin.  15. Aspirin 81 mg daily.  16. Prevacid 30 mg daily.  17. Home oxygen.  18. CPAP in the  evening for her obstructive sleep apnea.  19. PreviDent 5000 cc b.i.d.   PHYSICAL EXAMINATION:  GENERAL APPEARANCE:  She is an obese white female who  is in no apparent distress, able to converse in a limited way.  She is  intubated.  VITAL SIGNS:  She is afebrile, pulse 72, respirations 12 on ventilator,  blood pressure 110/40, saturating 94%.  HEENT:  Unremarkable.  NECK:  Supple.  No obvious JVD or carotid bruits.  Neck is quite full.  CARDIOVASCULAR:  Regular with no obvious murmur.  LUNGS:  Coarse breath sounds bilaterally, but no rales are noted.  SKIN:  Without significant rash.  ABDOMEN:  Obese, soft, nontender.  GU/RECTAL/BREAST:  Deferred.  EXTREMITIES:  No cyanosis, clubbing or edema.  She does have quite a bit of  adiposity, but no obvious edema.  Pulses are 1+.  MUSCULOSKELETAL/NEUROLOGICAL:  Nonfocal.   STUDIES:  Renal ultrasound shows no evidence of hydronephrosis.  Chest x-ray  shows ET tube in place.  No evidence of pulmonary edema.  Electrocardiogram  shows sinus rhythm at a rate of 78 with normal intervals, normal axis, no  ischemic ST-T wave changes.   Echo shows normal LV systolic function with no  wall motion abnormalities.  No significant valvular heart disease.   LABORATORY DATA:  White blood cell count 7.4, H&H 9 and 28, platelets  218,000.  Sodium 134, potassium 4.4, chloride 102, bicarbonate 29, BUN 58,  creatinine 2.7, blood sugar 52.  LFT's are all within normal limits.  Two  sets of cardiac enzymes showed total CK's of 777 and 790 with MB's of 9.3  and 6.6 and troponins of 0.73 and 0.57.  Hemoglobin A1C of 6.6, BNP 243,  lithium level 1.24.  Iron numbers show mild iron deficiency anemia.  Depakote is 3.8, and her urine drug screen is positive for benzodiazepines.   IMPRESSION:  This is a woman with ventilator dependent respiratory failure  which is likely multifactorial, abnormal cardiac enzymes with significant  renal dysfunction.  I am not sure how to interpret those.  It appears that  there may be some mild rhabdomyolysis going on.  The elevated troponins are  likely from the renal insufficiency.  Certainly not in a patter consistent  with acute myocardial infarction, especially with a normal echocardiogram in  the end revealing electrocardiogram.  Her renal insufficiency is under  evaluation.   PLAN:  Our plan is to continue to wean her as per Pulmonary recommendations.  Will follow her with you, but anticipate no further cardiac evaluation.      Vida Roller, M.D.  Electronically Signed     JH/MEDQ  D:  07/24/2005  T:  07/25/2005  Job:  [id number]   cc:   Laveda Abbe, M.D.

## 2011-02-10 NOTE — Discharge Summary (Signed)
[name], [name] NO.:  [id number]   MEDICAL RECORD NO.:  [id number]          PATIENT TYPE:  INP   LOCATION:  A208                          FACILITY:  [facility]   PHYSICIAN:  Vania Rea, M.D. DATE OF BIRTH:  Jan 03, 1966   DATE OF ADMISSION:  07/23/2005  DATE OF DISCHARGE:  11/06/2006LH                                 DISCHARGE SUMMARY   PRIMARY CARE PHYSICIAN:  The physicians at [facility].   DISCHARGE DIAGNOSES:  1.  Respiratory failure, resolved.  2.  Acute exacerbation of chronic obstructive pulmonary disease, much      improved.  3.  Obstructive sleep apnea, noncompliant with home CPAP.  4.  Morbid obesity.  5.  Acute renal failure in the setting of use of multiple NSAIDs and Lasix.  6.  Lithium toxicity, resolved.  7.  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  8.  Bipolar disorder, stable.  9.  History of seizure disorder with subtherapeutic Valproic acid levels.  10. Mild mental subnormality.  11. Diabetes type 2, uncontrolled, in the setting of temporary steroid use.  12. Hypertension, uncontrolled.  13. Anemia of chronic disease.   DISPOSITION:  Plan discharge to home with home health care.  Current status  stable.   CURRENT MEDICATIONS:  1.  Aspirin 81 mg daily.  2.  Abilify 30 mg daily.  3.  Klonopin 1 mg t.i.d.  4.  Depakote 1000 mg in the morning and 2500 mg at bedtime.  5.  Gabapentin 900 mg twice daily.  6.  Seroquel 200 mg at bedtime.  7.  Glipizide 10 mg twice daily (new).  8.  Lithium carbonate 900 mg at bedtime.  9.  Metformin Glucophage 1000 mg twice daily.  10. Toprol XL 50 mg daily.  11. Imdur 60 mg daily.  12. Levaquin 750 mg daily, day 2.  13. Solu-Medrol a tapering dose, decreasing to 40 mg IV q.12h. as of today.  14. Zocor 40 mg at bedtime.  15. Atrovent and albuterol by nebulizer when necessary.  16. NovoLog on insulin resistant sliding scale.   HOSPITAL COURSE:  Please refer to  admission history and physical dictated at  the time of admission.  This is a morbidly obese lady, who presented to the  emergency room with altered mental status and acute respiratory failure and  was emergently intubated in the emergency room.  Not much history was  available at the time the patient was admitted, but over the succeeding 24  hours, it became clear that the patient was in respiratory failure due to  COPD exacerbation.  She had been noncompliant with her CPAP at home, but she  was also in acute renal failure with consequent lithium toxicity.  The  patient was gradually weaned off the vent.  She was hydrated to improve her  renal function, and she has been improving progressively; however, it has  become clear that she has been passing extremely large volumes of urine in  excess of her intake, and she is being investigated for diabetes insipidus.  The patient is improved considerably from her  admission status; however, she  has been held over the weekend to arrange home health follow up and to  complete an investigation of her diabetes insipidus.  Her problems will be  discussed individually as outlined below.   Acute exacerbation of COPD.  The patient was managed with the assistance of  pulmonary specialist, Dr. Juanetta Gosling, and she was soon able to be weaned off  the vent and has been tolerating CPAP of 11 at bedtime.  The patient has a  very supportive family and has home health aide/community worker, Amy Nix,  who has been very instrumental in getting [place] information and coordinating  her care.  The patient apparently does not use her CPAP at home, although  she has been advised to.  She is oxygen dependent.  Currently, her chest is  clear, and she requires minimal nebulizations.  After extubation, she had  marked stridor and required racemic epinephrine inhalation for an acute  obstructive pharyngitis.   Acute renal failure.  The patient was admitted with a serum creatinine  of  3.8 and a BUN of 76.  She looked dehydrated, and her medications included  Lasix, lisinopril, and we later found out that she was taking 2 different  NSAIDs.  The patient was hydrated and improved rapidly; however, in the  first 24 hours of admission, she put out over 9 L of urine and yesterday,  although she is no longer getting IV fluids, she put out over 7 L of urine.  For a short period, nephrologists had her on normal saline, and she started  to become hypernatremic.  She was suspected of having diabetes insipidus and  was returned to half normal saline and did well on a reduced dose of normal  saline.  Since her IV fluids were discontinued yesterday and she continues  to put out large volumes of urine, the patient is being switched to a very  low sodium diet in the hope that this will be able to allow [place] to continue  her Lithium and manage her polyuria.  In the event that the low sodium diet  is not able to control her polyuria, consideration may be given to starting  her on low dose HCTZ.  We strongly recommend that ACE inhibitors be  discontinued, that Lasix be discontinued, as she has not needed it this  admission, and she puts out large amounts of urine, and she has had no need  for NSAIDs this admission.  Because of her risk for renal failure, the fact  that she does use morphine, she does use metformin, we would recommend again  anything that might precipitate acute renal failure.   Her bipolar disorder and seizure disorder.  On admission, the patient's  Lithium levels were supratherapeutic.  She was very jittery and confused,  and we presumed this was related to her supratherapeutic Lithium levels.  At  that point, she was on a ventilator and oxygenating adequately.  Her  valproic acid levels have been persistently subtherapeutic, and we have had  to markedly increase her dose of valproic acid to get it into a therapeutic range.  She apparently was taking 2.5 g daily at  home.  We have increased it  to 3.5 g.  Her gabapentin dose has been gradually increased to her baseline  dose.  Her Abilify dose has remained stable, and we have reinstituted her  home Lithium dose, and the levels seem to be remaining on the low side of  therapeutic, and I  think this is where we would want it.  Although she has  evidence of diabetes insipidus, we feel we will leave it to her psychiatrist  to decide if he wants to take her off the Lithium.   Her diabetes, type 2, uncontrolled.  Her diabetes has been very difficult to  control in the hospital.  We had her on fairly large doses of Lantus with  insulin resistant sliding-scale NovoLog, but her blood sugars tend to run in  the high 300s; however, there is some question as to the timing of her  capillary blood sugars in hospital.  In any case, the renal function  improved.  She was switched back to oral medications, and she is not very  happy about using insulin, and we feel she can probably be managed on  Glucophage and Glipizide, avoiding sulfonylures that tend to accumulate in  renal insufficiency.  It is likely that as we titrate off her Solu-Medrol,  her blood sugars will return to a normal range.  We note that her hemoglobin  A1c on admission was 6.6 which suggests that she had fairly good control  with Glyburide and metformin; however, we are not too happy with Glyburide  use at this time.   Hypertension.  Control has been variable.  Her antihypertensives were  initially discontinued because of blood pressure problems, but her blood  pressure has gradually risen.  She is now uncontrolled, and we are somewhat  hesitant to increase her Toprol further because of her COPD issues, and we  are somewhat reluctant to put her back on ACE inhibitors, and we will see  what happens as we reduce her Solu-Medrol.   MATTERS TO BE SORTED OUT PRIOR TO DISCHARGE:  Urine and serum osmolality are  pending.  If they do suggest diabetes  insipidus, we feel she seems to be  sufficiently comfortable where she can be managed just with diet or HCTZ,  although we note that she has had a Foley catheter in throughout this  admission.      Vania Rea, M.D.  Electronically Signed     LC/MEDQ  D:  07/29/2005  T:  07/29/2005  Job:  [id number]   cc:   Heloise Beecham. Med.

## 2011-02-10 NOTE — Consult Note (Signed)
NAMEFARRAN, AMSDEN               ACCOUNT NO.:  [id number]   MEDICAL RECORD NO.:  [id number]          PATIENT TYPE:  INP   LOCATION:  IC08                           FACILITY:   PHYSICIAN:  Edward L. Juanetta Gosling, M.D.DATE OF BIRTH:  April 04, 1966   DATE OF CONSULTATION:  07/24/2005  DATE OF DISCHARGE:                                   CONSULTATION   REASON FOR CONSULTATION:  Respiratory failure.  Ms. Critzer is a 45 year old  patient of Dr. Blair Dolphin who was admitted because of respiratory failure.  She has diabetes, bipolar disorder, seizure disorder, and apparently COPD.  She was at home, was confused.  Her son found her and brought her to the  emergency room where she was found to be confused, hypoxic, hypoglycemic,  and she was intubated.  There is some question of previous history of  intubation and respiratory failure but her history is really unclear.  Family is not present and she cannot give any history now.   PAST MEDICAL HISTORY:  According to the chart includes bipolar disorder,  CHF, diabetes mellitus, and seizures.   MEDICATIONS:  Also, according to her chart, Lasix, Imdur, Depakote, lithium,  clonazepam, Prevacid, lisinopril, Toprol, [place]ron, aspirin, vitamin D,  metformin, Glyburide, Ditropan, Clarinex, Lipitor, gabapentin.   ALLERGIES:  No known drug allergies.   SOCIAL AND FAMILY HISTORY:  Pretty well unobtainable.   PHYSICAL EXAMINATION:  Shows that she is a morbidly obese female who is in  the intensive care unit intubated, on mechanical ventilation.  Her blood  pressure is 108/57, pulse is 72.  Her respirations are by the ventilator.  HEENT:  Shows her pupils are round and reactive.  Mucous membranes are  mildly dry.  NECK:  She does not have any definite adenopathy.  CHEST:  Shows rhonchi bilaterally, fairly remarkable rhonchi.  HEART:  Is regular without gallop.  ABDOMEN:  Shows it soft, obese without masses.  Bowel sounds are active.  EXTREMITIES:  Show no  edema.  CENTRAL NERVOUS SYSTEM:  Shows no focal findings.   Original pH was 7.24, PACO2 of 78, PAO2 of 69.  White count was 8100 and  hemoglobin 9.8.  Sodium 134, potassium 5.54, chloride 99, CO2 28, glucose  46, BUN 76, creatinine 3.8.  Drug screen was positive for benzodiazepines  but did not show cocaine, marijuana, acetaminophen or salicylate.  Blood  work this morning shows blood gas is PAO2 of 75, PACO2 of 38, pH 7.47.  This  is on 5 of PEEP, rate of 14, FIO2 of 60% and 700 tidal volume.  Cardiac  panel shows CK 790, MB of 6.6, troponin of 0.57.  CBC:  White count 7400,  hemoglobin 9.6, platelets 218.  Current metabolic profile shows that her BUN  is down to 58 and creatinine down to 2.7.  Lithium level is 1.3.   ASSESSMENT:  She has respiratory failure, probably multifactorial but now  she has elevation of her cardiac enzymes including troponin.  She will  probably need cardiology consult.  I think we need to hold off on trying to  do any sort of extubation  planning until we have sorted out her cardiac  situation a bit.  I do not plan any other changes.      Edward L. Juanetta Gosling, M.D.  Electronically Signed     ELH/MEDQ  D:  07/24/2005  T:  07/24/2005  Job:  [id number]

## 2011-02-10 NOTE — Discharge Summary (Signed)
[name], [name]               ACCOUNT NO.:  [id number]   MEDICAL RECORD NO.:  [id number]          PATIENT TYPE:  INP   LOCATION:  A206                          FACILITY:  [facility]   PHYSICIAN:  Calvert Cantor, M.D.     DATE OF BIRTH:  03/03/1966   DATE OF ADMISSION:  08/09/2005  DATE OF DISCHARGE:  11/20/2006LH                                 DISCHARGE SUMMARY   PRIMARY CARE PHYSICIAN:  [name].   NEUROLOGIST:  [name].   PSYCHIATRIST:  [name] with [facility].   COMMUNITY SUPPORT WORKER:  Amy Nix, phone number [phone].   CASE MANAGER:  Kathalene Frames, phone number [phone], extension [phone].   DISCHARGE DIAGNOSES:  1.  Chronic obstructive pulmonary disease exacerbation requiring mechanical      ventilation.  2.  Obstructive sleep apnea. The patient was not using her CPAP machine at      home and therefore developed respiratory distress.  3.  Severe obesity.  4.  Bipolar disorder.  5.  Diabetes mellitus type 2.  6.  Acute renal failure.  7.  Nephrogenic diabetes insipidus.  8.  History of seizure disorder.  9.  Hypertension.  10. Anemia of chronic disease.   DISCHARGE MEDICATIONS:  1.  Hydrochlorothiazide 25 mg daily.  2.  Toprol-XL 50 mg daily.  3.  Abilify 30 mg daily.  4.  Isosorbide mononitrate 60 mg daily.  5.  Aspirin 81 mg daily.  6.  Multivitamin 1 tablet daily.  7.  Lithium which has been decreased from 900 to 600 mg at bedtime.  8.  Depakote 2,500 mg in the morning and 1,000 mg at bedtime.  9.  Advair 250/50 one puff b.i.d.  10. Neurontin 900 mg in the morning and again in the evening.  11. Klonopin 1 mg q.8h. p.r.n.  12. Metformin 1,000 mg b.i.d.  13. Prednisone taper.  14. Calcium carbonate 600 mg b.i.d.  15. Zocor 40 mg nightly  16. Atrovent and albuterol nebulizers p.r.n. dyspnea.   FOLLOWUP INSTRUCTIONS:  1.  She is to follow up with Dr. Jenelle Mages by the end of this week.  2.  She is to have a follow  up lithium level on Wednesday.  3.  She is to follow up with [name] whom she has an appointment with in      about two week's time.   HOSPITAL COURSE:  This is a 45 year old Caucasian female with past medical  history of severe obstructive sleep apnea and COPD who has been admitted  multiple times for respiratory failure. Apparently, she was not using her  CPAP at home, and on discharge from the hospital on November 6, she was  advised that she needs to start using her CPAP. However, due to  misunderstanding, the patient continued to not use it. Her family was also  not aware of the fact that she should be using it. Did have her machine, but  it was packed and put away because it was having some mechanical problems.  Therefore, before discharge, her support worker has contacted the CPAP  company, and it has been ensured that they will advise the patient, the  family, and the support worker on how to use the CPAP machine correctly and  supply a new one if this one is giving problems.   Again, she was admitted at this time for retention of carbon dioxide. ABG on  admission was pH of 7.274, pCO2 83.7, pO2 74.9 with an oxygen saturation of  92.8%. The patient is already on home O2 which she is continue to maintain  an oxygen saturation between 90 and 93%. However, she was now going to be  using the CPAP at night in addition to oxygen.   The patient was also admitted at this time with acute renal insufficiency.  Her BUN was 31 and creatinine was 2.4. Previously when she was admitted with  renal insufficiency, she was told to discontinue her Lasix and her  ibuprofen. However, on talking with her support worker today, she did not  discontinue the ibuprofen and was continuing to take it at 800 mg b.i.d. It  has been advised that she needs to stop this medication, and it also been  written on her discharge paperwork. Her BUN and creatinine were treated with  IV fluids. On discharge today, BUN  is 28 with a creatinine of 1.4.   The patient is on lithium for bipolar disorder. On admission, her lithium  level was elevated at 1.57, and therefore her lithium was held for two days.  When it was restarted at 900 mg and a level was checked on the following  day, it was again elevated at 1.42. Therefore, her lithium dosage has been  decreased to 600 mg. She is to have a followup lithium level on Wednesday.  Medications can be adjusted as necessary as an outpatient.   The patient was found to have diabetes insipidus on her last admission. She  was passing about 5 to 7 liters of urine per day. Her sodium was slightly  elevated but not severely elevated. The patient was placed on  hydrochlorothiazide at 25 mg daily on discharge, and on admission at this  time, her urine output has been monitored. She has been making 1 to 1-1/2  liters of urine per day. Therefore, the hydrochlorothiazide is effective in  controlling the diabetes insipidus. However, she has been on long-term  lithium which can further lead to acute tubular necrosis in addition to  diabetes insipidus. Ms. Brierley has an appointment with her psychiatrist,  [name], in about two weeks' time who can decide whether to titrate her off  of the lithium or to continue it.   On her previous admission, the patient was noted to have a low Depakote  level, and therefore, her dosage of Depakote was increased. On admission  this time, her blood level was 95 which was within normal limits, and  therefore, her Depakote dosage is to be continued as previously ordered.   Diabetes mellitus has been controlled with a NovoLog sliding scale while in  the hospital. This morning, her CBG was 192. She has also been started on  metformin at 1,000 mg b.i.d.  She will be discharged home with prescription  for metformin. Her primary care physicians can further decide whether to continue her on insulin as an outpatient.   Hypertension has been  controlled with Toprol, hydrochlorothiazide, and  isosorbide mononitrate. Blood pressure was 116/74.   DISCHARGE BLOOD WORK:  WBC count is 8.3, hemoglobin 11.6, hematocrit 34.1,  platelets 202. Sodium 134, potassium  3.3 which will be replaced orally  before discharge, chloride 88, bicarb 28, glucose 167, BUN 28, creatinine  1.4, calcium 9.4. Lithium level this morning was 1.42, and therefore her  dose has been decreased. UA was positive for benzodiazepines on admission.   Her discharge medications and plan of care have been discussed thoroughly  with Amy Nix who is her community Environmental manager. In addition, a copy of  this discharge summary will be sent to Kathalene Frames who is her case Production designer, theatre/television/film.      Calvert Cantor, M.D.  Electronically Signed     SR/MEDQ  D:  08/14/2005  T:  08/14/2005  Job:  [id number]   cc:   Jenelle Mages, M.D.  [facility]   Ward, M.D.  [facility]   [facility]   [facility]  Phone number [phone], ext [phone]

## 2011-02-10 NOTE — Consult Note (Signed)
[name], [name]               ACCOUNT NO.:  [id number]   MEDICAL RECORD NO.:  [id number]          PATIENT TYPE:  INP   LOCATION:  IC08                           FACILITY:   PHYSICIAN:  Jorja Loa, M.D.DATE OF BIRTH:  March 21, 1966   DATE OF CONSULTATION:  07/24/2005  DATE OF DISCHARGE:                                   CONSULTATION   REASON FOR CONSULTATION:  Renal insufficiency.  [name] is a 45-year-  old, Caucasian whose has a past medical history of diabetes, bipolar  disorder, history of seizure presently admitted with altered mental status,  hypoglycemia, respiratory failure requiring intubation.  Presently, the  patient is intubated and alert.  This morning, a consult is called because  of elevated BUN and creatinine.  The patient this morning tried to  communicate by writing and so she indicates that she does not have any  previous history of renal failure, history of kidney stones.  Before all  this happened, the patient also denies any nausea or vomiting.  Overall, she  seemed to be doing reasonably good.  Ever since the patient was intubated,  it is very difficult to get an accurate history and there is no family  member.   PAST MEDICAL HISTORY:  1.  She has a history of bipolar disorder.  2.  History of diabetes.  3.  History of obesity.  4.  History of COPD.  5.  History of seizure disorder.  6.  History of diabetes.  7.  Hypertension.  8.  History of hypercholesterolemia.  9.  History of neuropathy.   MEDICATIONS AT LEAST AS AN OUTPATIENT:  Seems to be very significant  including Lasix, Imdur, Depakote, lithium, clonazepam, Prevacid, lisinopril,  Toprol-XL, aspirin, vitamin D, metformin, Glyburide, Ditropan, Clarinex,  Lipitor, and gabapentin.  However, this is information which was received  from the history and physical.   Presently, she is getting IV fluids about 100 mL per hour.  She is on  Lovenox __________  mg subcu nightly, Protonix 40 mg  IV, __________  4.5 g  IV every 8 hours.  She is also on Proventil inhaler and Depakote also.   ALLERGIES:  No known allergies.   SOCIAL HISTORY:  At this moment, not available but the patient denies  shaking her head for no smoking or drug use.   FAMILY HISTORY:  No history of renal failure.   REVIEW OF SYSTEMS:  The patient at this moment, seems to be feeling better.  In fact, she was writing __________  when she would be extubated and when  she will be sent home.  She denies any chest pain.  Her only complaint that  she has is the intubation and pain associated with it.  She denies any chest  pain.   PHYSICAL EXAMINATION:  On examination, the patient this morning seems to be  more alert even though she is intubated, in no apparent distress.  Her blood  pressure is 130/70.  CHEST:  Is clear to auscultation.  HEART:  Revealed a regular rate and rhythm.  ABDOMEN:  Soft, positive bowel sounds.  EXTREMITIES:  She has a trace edema.   Her 24 hours protein output is she had 500 input and 1400 output with 1900  output.  Her blood work from this morning, her pH is 7.47, PACO2 38. When  she came in, pH was 7.39, PACO2 of 42.7, and her white blood cell count is  7.4, hemoglobin 9.6, hematocrit 29.8.  Her sodium is 134, potassium 4.4.  When she came in, her potassium was 5.5, BUN is 58, creatinine 2.7.  Yesterday, when she came in, BUN was 76 and creatinine 3.9.  Her AST is 45,  SGOT 71, albumin is 2.7.  CPK is 790, MB fraction is 6.6, and troponin is  0.7.  Her iron saturation is 11% with a ferritin of 43.  Lithium was 1.052,  today it is 1.3 and she has was positive for benzodiazepine.  Her urine  specific gravity is 1.01, pH was 5.  Urine culture is pending.   ASSESSMENT:  Problem 1. Renal insufficiency.  This woman seems to be acute,  etiology not clear but with a high specific gravity as well as elevated CPK,  this woman's prerenal syndrome seems to be the most likely etiology.   This  could be a combination of diuretics and also she was on an ACE inhibitor.  Presently, her BUN and creatinine seems to be improving.   Problem 2. Respiratory acidosis.  The patient is in respiratory failure  requiring intubation.  Presently, seems to be doing reasonably good.   Problem 3. Anemia.  With low iron saturation and ferritin, iron deficiency  anemia seems to be the most likely etiology for that.   Problem 4. History of elevated liver function tests, etiology not clear.  Not sure whether this is acute or chronic.   Problem 5. History of hypoglycemia.  She is a diabetic patient on  hypoglycemic agent and probably that may be the reason.   Problem 6. Mildly elevated lithium.  This seems to be improving.   Problem 7. Bipolar disorder.   Problem 8. History of chronic obstructive pulmonary disease.   RECOMMENDATIONS:  I agree with hydration.  Probably will increase her IV  fluids to 125 mL per hour and change her to D5 normal saline.  Will continue  the other treatment probably once the patient is extubated, we may start her  on pure iron.  Will continue with the present treatment.  If her urine  output does not improve, probably will try her with some diuretics.     Jorja Loa, M.D.  Electronically Signed    BB/MEDQ  D:  07/24/2005  T:  07/24/2005  Job:  [id number]

## 2011-02-10 NOTE — Procedures (Signed)
[name], [name] NO.:  [id number]   MEDICAL RECORD NO.:  [id number]          PATIENT TYPE:  INP   LOCATION:  IC08                          FACILITY:  [facility]   PHYSICIAN:  Vida Roller, M.D.   DATE OF BIRTH:  12-26-1965   DATE OF PROCEDURE:  07/24/2005  DATE OF DISCHARGE:                                  ECHOCARDIOGRAM   PRIMARY CARE PHYSICIAN:  Dr. Orvan Falconer   This is a 44 year old woman in the ICU status post a respiratory arrest  intubated on the ventilator.  The technical quality of the study is  difficult.   M-MODE TRACING:  The aorta is 33 mm.   Left atrium is 37 mm.   Septum is 10 mm.   Posterior wall is 10 mm.   Left ventricular diastolic dimension 52 mm.   Left ventricular systolic dimension 29 mm.   2-D AND DOPPLER IMAGING:  The left ventricle is normal size with vigorous LV  systolic function, estimated ejection fraction 70-75%.  There are no obvious  wall motion abnormalities.   The right ventricle is top normal in size with normal systolic function.   Both atria appear to be normal size.   There is no obvious valvular heart disease, although this was not completely  interrogated.  Specifically, though, there was no evidence of aortic  stenosis, mitral stenosis, or severe mitral regurgitation.   There is no pericardial effusion.      Vida Roller, M.D.  Electronically Signed    JH/MEDQ  D:  07/24/2005  T:  07/24/2005  Job:  [id number]

## 2011-02-10 NOTE — Group Therapy Note (Signed)
[name], MERGEN               ACCOUNT NO.:  [id number]   MEDICAL RECORD NO.:  [id number]          PATIENT TYPE:  INP   LOCATION:  IC08                          FACILITY:  [facility]   PHYSICIAN:  Edward L. Juanetta Gosling, M.D.DATE OF BIRTH:  [birth year]   DATE OF PROCEDURE:  DATE OF DISCHARGE:                                   PROGRESS NOTE   PROBLEM:  Respiratory failure, multifactorial.   SUBJECTIVE:  Mrs. Huberty motions that she wants the tube out.  She became  much more alert yesterday evening and has been asking for the tube to be  removed since then.  She has been cleared by cardiology.  It does not appear  that she is having an infarction.   Her exam today shows that her blood pressure is 122/62, pulse is 86.  O2 sat  96, respirations 23.  She still has minimal bilateral rhonchi but not a  great deal.  Her heart is regular.  Abdomen soft without masses.   White blood count 6600, hemoglobin is 9.6, platelets are 229.  Comp  metabolic profile shows that her BUN is down to 28 now with a creatinine of  2.  Electrolytes basically normal.  Her chloride is 118, CO2 is 24, glucose  is 392.   ASSESSMENT:  She is better.   PLAN:  Hopefully she will be able to be extubated later this morning.      Edward L. Juanetta Gosling, M.D.  Electronically Signed     ELH/MEDQ  D:  07/25/2005  T:  07/25/2005  Job:  [id number]

## 2011-06-23 ENCOUNTER — Other Ambulatory Visit: Payer: Self-pay

## 2011-06-23 ENCOUNTER — Emergency Department (HOSPITAL_COMMUNITY)
Admission: EM | Admit: 2011-06-23 | Discharge: 2011-06-23 | Disposition: A | Discharge status: Home / Self Care | Payer: Medicaid Other | Attending: Emergency Medicine | Admitting: Emergency Medicine

## 2011-06-23 ENCOUNTER — Emergency Department (HOSPITAL_COMMUNITY): Payer: Medicaid Other

## 2011-06-23 DIAGNOSIS — I4510 Unspecified right bundle-branch block: Secondary | ICD-10-CM | POA: Insufficient documentation

## 2011-06-23 DIAGNOSIS — R55 Syncope and collapse: Secondary | ICD-10-CM | POA: Insufficient documentation

## 2011-06-23 DIAGNOSIS — Z794 Long term (current) use of insulin: Secondary | ICD-10-CM | POA: Insufficient documentation

## 2011-06-23 DIAGNOSIS — J4489 Other specified chronic obstructive pulmonary disease: Secondary | ICD-10-CM | POA: Insufficient documentation

## 2011-06-23 DIAGNOSIS — Z7982 Long term (current) use of aspirin: Secondary | ICD-10-CM | POA: Insufficient documentation

## 2011-06-23 DIAGNOSIS — Z79899 Other long term (current) drug therapy: Secondary | ICD-10-CM | POA: Insufficient documentation

## 2011-06-23 DIAGNOSIS — I10 Essential (primary) hypertension: Secondary | ICD-10-CM | POA: Insufficient documentation

## 2011-06-23 DIAGNOSIS — R569 Unspecified convulsions: Secondary | ICD-10-CM | POA: Insufficient documentation

## 2011-06-23 DIAGNOSIS — E119 Type 2 diabetes mellitus without complications: Secondary | ICD-10-CM | POA: Insufficient documentation

## 2011-06-23 DIAGNOSIS — J449 Chronic obstructive pulmonary disease, unspecified: Secondary | ICD-10-CM | POA: Insufficient documentation

## 2011-06-23 HISTORY — DX: Phobic anxiety disorder, unspecified: F40.9

## 2011-06-23 HISTORY — DX: Panic disorder (episodic paroxysmal anxiety): F41.0

## 2011-06-23 HISTORY — DX: Hereditary factor VIII deficiency: D66

## 2011-06-23 HISTORY — DX: Chronic obstructive pulmonary disease, unspecified: J44.9

## 2011-06-23 HISTORY — DX: Unspecified convulsions: R56.9

## 2011-06-23 HISTORY — DX: Anxiety disorder, unspecified: F41.9

## 2011-06-23 HISTORY — DX: Essential (primary) hypertension: I10

## 2011-06-23 LAB — URINALYSIS, ROUTINE W REFLEX MICROSCOPIC
Glucose, UA: 100 mg/dL — AB
Ketones, ur: NEGATIVE mg/dL
Leukocytes, UA: NEGATIVE
Protein, ur: NEGATIVE mg/dL
Urobilinogen, UA: 0.2 mg/dL (ref 0.0–1.0)

## 2011-06-23 LAB — COMPREHENSIVE METABOLIC PANEL
Albumin: 3.6 g/dL (ref 3.5–5.2)
Alkaline Phosphatase: 82 U/L (ref 39–117)
BUN: 19 mg/dL (ref 6–23)
CO2: 32 mEq/L (ref 19–32)
Chloride: 100 mEq/L (ref 96–112)
Creatinine, Ser: 1.03 mg/dL (ref 0.50–1.10)
GFR calc Af Amer: >60 mL/min (ref >60)
GFR calc non Af Amer: 58 mL/min — ABNORMAL LOW (ref >60)
Glucose, Bld: 221 mg/dL — ABNORMAL HIGH (ref 70–99)
Potassium: 4.6 mEq/L (ref 3.5–5.1)
Total Bilirubin: 0.2 mg/dL — ABNORMAL LOW (ref 0.3–1.2)

## 2011-06-23 LAB — POCT PREGNANCY, URINE: Preg Test, Ur: NEGATIVE

## 2011-06-23 LAB — DIFFERENTIAL
Lymphocytes Relative: 37 % (ref 12–46)
Lymphs Abs: 1.7 10*3/uL (ref 0.7–4.0)
Monocytes Absolute: 0.4 10*3/uL (ref 0.1–1.0)
Monocytes Relative: 10 % (ref 3–12)
Neutro Abs: 2.3 10*3/uL (ref 1.7–7.7)
Neutrophils Relative %: 50 % (ref 43–77)

## 2011-06-23 LAB — RAPID URINE DRUG SCREEN, HOSP PERFORMED: Amphetamines: NOT DETECTED

## 2011-06-23 LAB — ETHANOL: Alcohol, Ethyl (B): <11 mg/dL (ref 0–11)

## 2011-06-23 LAB — CBC
HCT: 40.7 % (ref 36.0–46.0)
Hemoglobin: 13.2 g/dL (ref 12.0–15.0)
RBC: 4.68 MIL/uL (ref 3.87–5.11)

## 2011-06-23 NOTE — ED Notes (Signed)
Per ems, pt reports hx of seizure 22 years ago, and felt like she was having one today.  Pt reports " i felt like worms were crawling on my forehead, and my eye was drooping".  Pt is alert and oriented at this time.  nad noted

## 2011-06-23 NOTE — ED Provider Notes (Signed)
History   Scribed for Carleene Cooper III, MD, the patient was seen in room APA16A/APA16A. This chart was scribed by Clarita Crane. This patient's care was started at 10:12AM.  CSN: [id number] Arrival date & time: 06/23/2011  9:42 AM  Chief Complaint  Patient presents with  . Seizures   HPI [name] is a 45 y.o. female who presents to the Emergency Department complaining of seizure of unknown duration which occurred yesterday witnessed by home nurse with associated urinary incontinence. Denies convulsions with seizure. Patient reports she awoke today with blurred vision and a sensation of "worms crawling over my forehead" and relates sensation to numbness. Patient also reports having nasal congestion and non-productive cough recently which were relieved with use of Mucinex. Denies fever, sore throat, ear pain, chest pain, abdominal pain. Patient with h/o Diabetes, seizures, hemophilia, hypertension, panic attacks, COPD. Patient with surgical h/o abdominal hysterectomy, oophorectomy. Patient is a non-smoker, denies alcohol use is ambulatory with use of a walker.   HPI ELEMENTS: Onset: yesterday Duration: unknown  Context:  as above  Associated symptoms: +urinary incontinence, blurred vision, numbness "like worms" to forehead. Denies fever, sore throat, ear pain, chest pain, abdominal pain.  PAST MEDICAL HISTORY:  Past Medical History  Diagnosis Date  . Diabetes mellitus   . Seizures   . Hemophilia   . Hypertension   . Anxiety   . Panic attacks   . Fear of     falling  . COPD (chronic obstructive pulmonary disease)     PAST SURGICAL HISTORY:  Past Surgical History  Procedure Date  . Abdominal hysterectomy   . Oophorectomy     FAMILY HISTORY:  No family history on file.   SOCIAL HISTORY: History   Social History  . Marital Status: Married    Spouse Name: N/A    Number of Children: N/A  . Years of Education: N/A   Social History Main Topics  . Smoking status: Never  Smoker   . Smokeless tobacco: None  . Alcohol Use: No  . Drug Use: No  . Sexually Active:    Other Topics Concern  . None   Social History Narrative  . None     Review of Systems 10 Systems reviewed and are negative for acute change except as noted in the HPI.  Allergies  Review of patient's allergies indicates no known allergies.  Home Medications   Current Outpatient Rx  Name Route Sig Dispense Refill  . ARIPIPRAZOLE 30 MG PO TABS Oral Take 30 mg by mouth every morning.      . ASPIRIN EC 81 MG PO TBEC Oral Take 81 mg by mouth every morning.      Marland Kitchen BIOTIN 300 MCG PO TABS Oral Take 1 tablet by mouth at bedtime.      Marland Kitchen CALCIUM CARBONATE 600 MG PO TABS Oral Take 600 mg by mouth 2 (two) times daily with a meal.      . CLONAZEPAM 1 MG PO TABS Oral Take 1 mg by mouth 3 (three) times daily.      Marland Kitchen DIVALPROEX SODIUM 250 MG PO TBEC Oral Take 2,500 mg by mouth 3 (three) times daily. Take 10 tablets by mouth at bedtime     . DOCUSATE SODIUM 100 MG PO CAPS Oral Take 100 mg by mouth 3 (three) times daily.      Marland Kitchen GABAPENTIN 300 MG PO CAPS Oral Take 300 mg by mouth 3 (three) times daily.      Marland Kitchen GEMFIBROZIL 600  MG PO TABS Oral Take 600 mg by mouth 2 (two) times daily before a meal.      . GLIPIZIDE 10 MG PO TABS Oral Take 10 mg by mouth 2 (two) times daily before a meal.      . IBUPROFEN 800 MG PO TABS Oral Take 800 mg by mouth every 8 (eight) hours as needed. For pain      . INSULIN GLARGINE 100 UNIT/ML [place] SOLN Subcutaneous Inject 70 Units into the skin every morning.      . ISOSORBIDE MONONITRATE CR 60 MG PO TB24 Oral Take 60 mg by mouth every morning.      Marland Kitchen LISINOPRIL 10 MG PO TABS Oral Take 10 mg by mouth every morning.      Marland Kitchen METOPROLOL SUCCINATE 50 MG PO TB24 Oral Take 50 mg by mouth daily.      Marland Kitchen MINOXIDIL 2.5 MG PO TABS Oral Take 2.5 mg by mouth at bedtime.      Carma Leaven M PLUS PO TABS Oral Take 1 tablet by mouth every morning.      . OXYBUTYNIN CHLORIDE 5 MG PO TABS Oral Take 5 mg  by mouth every morning.      Marland Kitchen PRAVASTATIN SODIUM 40 MG PO TABS Oral Take 40 mg by mouth at bedtime.      Marland Kitchen QUETIAPINE FUMARATE 300 MG PO TABS Oral Take 300 mg by mouth at bedtime.      Marland Kitchen SITAGLIPTIN PHOSPHATE 100 MG PO TABS Oral Take 100 mg by mouth every morning.      Marland Kitchen TRAMADOL HCL 50 MG PO TABS Oral Take 50 mg by mouth every 6 (six) hours as needed. For pain       BP 114/72  Pulse 76  Temp(Src) 97.5 F (36.4 C) (Oral)  Resp 16  Ht 5\' 8"  (1.727 m)  Wt 250 lb (113.399 kg)  BMI 38.01 kg/m2  SpO2 96%  Physical Exam  Nursing note and vitals reviewed. Constitutional: She is oriented to person, place, and time. She appears well-developed and well-nourished.  HENT:  Head: Normocephalic and atraumatic.  Right Ear: Tympanic membrane normal.  Left Ear: Tympanic membrane normal.  Mouth/Throat: Oropharynx is clear and moist.  Eyes: Pupils are equal, round, and reactive to light.  Neck: Neck supple.  Cardiovascular: Normal rate and regular rhythm.  Exam reveals no gallop and no friction rub.   No murmur heard. Pulmonary/Chest: Effort normal and breath sounds normal. No respiratory distress. She has no wheezes. She has no rales.  Abdominal: Soft. Bowel sounds are normal. She exhibits no distension. There is no tenderness.       Obese.   Musculoskeletal: Normal range of motion. She exhibits no edema.  Neurological: She is alert and oriented to person, place, and time. No sensory deficit.  Skin: Skin is warm and dry.  Psychiatric: She has a normal mood and affect. Her behavior is normal.    ED Course  Procedures MDM   OTHER DATA REVIEWED: Nursing notes, vital signs, and past medical records reviewed. Lab results reviewed and considered Imaging results reviewed and considered  DIAGNOSTIC STUDIES:  Date: 06/23/2011  Rate:99  Rhythm: normal sinus rhythm  QRS Axis: left  Intervals: normal  ST/T Wave abnormalities: normal  Conduction Disutrbances:right bundle branch block   Narrative Interpretation: Abnormal EKG  Old EKG Reviewed: none available  LABS / RADIOLOGY: Results for orders placed during the hospital encounter of 06/23/11  CBC      Component Value Range  WBC 4.6  4.0 - 10.5 (K/uL)   RBC 4.68  3.87 - 5.11 (MIL/uL)   Hemoglobin 13.2  12.0 - 15.0 (g/dL)   HCT 14.7  82.9 - 56.2 (%)   MCV 87.0  78.0 - 100.0 (fL)   MCH 28.2  26.0 - 34.0 (pg)   MCHC 32.4  30.0 - 36.0 (g/dL)   RDW 13.0  86.5 - 78.4 (%)   Platelets 167  150 - 400 (K/uL)  DIFFERENTIAL      Component Value Range   Neutrophils Relative 50  43 - 77 (%)   Neutro Abs 2.3  1.7 - 7.7 (K/uL)   Lymphocytes Relative 37  12 - 46 (%)   Lymphs Abs 1.7  0.7 - 4.0 (K/uL)   Monocytes Relative 10  3 - 12 (%)   Monocytes Absolute 0.4  0.1 - 1.0 (K/uL)   Eosinophils Relative 3  0 - 5 (%)   Eosinophils Absolute 0.1  0.0 - 0.7 (K/uL)   Basophils Relative 1  0 - 1 (%)   Basophils Absolute 0.0  0.0 - 0.1 (K/uL)  COMPREHENSIVE METABOLIC PANEL      Component Value Range   Sodium 139  135 - 145 (mEq/L)   Potassium 4.6  3.5 - 5.1 (mEq/L)   Chloride 100  96 - 112 (mEq/L)   CO2 32  19 - 32 (mEq/L)   Glucose, Bld 221 (*) 70 - 99 (mg/dL)   BUN 19  6 - 23 (mg/dL)   Creatinine, Ser 6.96  0.50 - 1.10 (mg/dL)   Calcium 9.9  8.4 - 29.5 (mg/dL)   Total Protein 7.0  6.0 - 8.3 (g/dL)   Albumin 3.6  3.5 - 5.2 (g/dL)   AST 14  0 - 37 (U/L)   ALT 11  0 - 35 (U/L)   Alkaline Phosphatase 82  39 - 117 (U/L)   Total Bilirubin 0.2 (*) 0.3 - 1.2 (mg/dL)   GFR calc non Af Amer 58 (*) >60 (mL/min)   GFR calc Af Amer >60  >60 (mL/min)  URINALYSIS, ROUTINE W REFLEX MICROSCOPIC      Component Value Range   Color, Urine YELLOW  YELLOW    Appearance CLEAR  CLEAR    Specific Gravity, Urine 1.010  1.005 - 1.030    pH 7.0  5.0 - 8.0    Glucose, UA 100 (*) NEGATIVE (mg/dL)   Hgb urine dipstick NEGATIVE  NEGATIVE    Bilirubin Urine NEGATIVE  NEGATIVE    Ketones, ur NEGATIVE  NEGATIVE (mg/dL)   Protein, ur NEGATIVE   NEGATIVE (mg/dL)   Urobilinogen, UA 0.2  0.0 - 1.0 (mg/dL)   Nitrite NEGATIVE  NEGATIVE    Leukocytes, UA NEGATIVE  NEGATIVE   URINE RAPID DRUG SCREEN (HOSP PERFORMED)      Component Value Range   Opiates NONE DETECTED  NONE DETECTED    Cocaine NONE DETECTED  NONE DETECTED    Benzodiazepines NONE DETECTED  NONE DETECTED    Amphetamines NONE DETECTED  NONE DETECTED    Tetrahydrocannabinol NONE DETECTED  NONE DETECTED    Barbiturates NONE DETECTED  NONE DETECTED   ETHANOL      Component Value Range   Alcohol, Ethyl (B) <11  0 - 11 (mg/dL)  POCT PREGNANCY, URINE      Component Value Range   Preg Test, Ur NEGATIVE     Ct Head Wo Contrast  06/23/2011  *RADIOLOGY REPORT*  Clinical Data: Seizure yesterday.  Seizure today.  Hit  posterior and anterior head.  History of diabetes, hypertension, epilepsy.  CT HEAD WITHOUT CONTRAST  Technique:  Contiguous axial images were obtained from the base of the skull through the vertex without contrast.  Comparison: 04/05/2007  Findings: Bone windows demonstrate no significant soft tissue swelling.  No skull fracture.  Clear paranasal sinuses and mastoid air cells.  Cerumen within the right external ear canal.  Soft tissue windows demonstrate age advanced cerebral atrophy. Cerebellar atrophy as well. No  mass lesion, hemorrhage, hydrocephalus, acute infarct, intra-axial, or extra-axial fluid collection.  IMPRESSION:  1.  No acute or post-traumatic intracranial abnormality. 2.  Age advanced cerebral and cerebellar atrophy.  Original Report Authenticated By: Consuello Bossier, M.D.    PROCEDURES:  ED COURSE / COORDINATION OF CARE: Orders Placed This Encounter  Procedures  . CT Head Wo Contrast  . CBC  . Differential  . Comprehensive metabolic panel  . Urinalysis with microscopic  . Drug screen panel, emergency  . Ethanol  . Pregnancy, urine POC  . EKG test  1:50PM- Consult complete with Kandice Hams, RNP, pt's provider in [facility] Patient case  explained and discussed. Ms Reginold Agent will follow pt as one of her home visit patients.   PLAN:The patient is to return the emergency department if there is any worsening of symptoms. I have reviewed the discharge instructions with the patient/family  CONDITION ON DISCHARGE: Good  DIAGNOSIS: 1. Near syncope      MEDICATIONS GIVEN IN THE E.D.  Medications  ibuprofen (ADVIL,MOTRIN) 800 MG tablet (not administered)  traMADol (ULTRAM) 50 MG tablet (not administered)  gabapentin (NEURONTIN) 300 MG capsule (not administered)  docusate sodium (COLACE) 100 MG capsule (not administered)  clonazePAM (KLONOPIN) 1 MG tablet (not administered)  calcium carbonate (OS-CAL) 600 MG TABS (not administered)  glipiZIDE (GLUCOTROL) 10 MG tablet (not administered)  gemfibrozil (LOPID) 600 MG tablet (not administered)  lisinopril (PRINIVIL,ZESTRIL) 10 MG tablet (not administered)  isosorbide mononitrate (IMDUR) 60 MG 24 hr tablet (not administered)  aspirin EC 81 MG tablet (not administered)  Multiple Vitamins-Minerals (MULTIVITAMINS THER. W/MINERALS) TABS (not administered)  oxybutynin (DITROPAN) 5 MG tablet (not administered)  metoprolol (TOPROL-XL) 50 MG 24 hr tablet (not administered)  sitaGLIPtin (JANUVIA) 100 MG tablet (not administered)  ARIPiprazole (ABILIFY) 30 MG tablet (not administered)  Biotin 300 MCG TABS (not administered)  QUEtiapine (SEROQUEL) 300 MG tablet (not administered)  minoxidil (LONITEN) 2.5 MG tablet (not administered)  pravastatin (PRAVACHOL) 40 MG tablet (not administered)  divalproex (DEPAKOTE) 250 MG DR tablet (not administered)  insulin glargine (LANTUS) 100 UNIT/ML injection (not administered)      I personally performed the services described in this documentation, which was scribed in my presence. The recorded information has been reviewed and considered. Osvaldo Human, MD        Carleene Cooper III, MD 06/23/11 1455

## 2011-06-23 NOTE — Discharge Instructions (Signed)
Mrs. Bellomo, you had a spell where your went forward and backwards and were briefly unconscious today.  You had physical examination, laboratory tests, EKG and CT x-ray of the head to check on you for this.  Fortunately, your tests were good.  It is safe to go home and to continue to take your same medications.

## 2011-06-23 NOTE — ED Notes (Signed)
MD at bedside. 

## 2011-06-23 NOTE — ED Notes (Signed)
Pt discharged home. Trying to get a hold of husband at this time.

## 2011-06-23 NOTE — ED Notes (Signed)
Report received from [facility] at this time. Pt heading to CT at present time.

## 2011-07-10 LAB — CARDIAC PANEL(CRET KIN+CKTOT+MB+TROPI)
Relative Index: 1.9
Relative Index: 1.9
Total CK: 177
Troponin I: 0.02

## 2011-07-10 LAB — URINALYSIS, ROUTINE W REFLEX MICROSCOPIC
Bilirubin Urine: NEGATIVE
Glucose, UA: >1000 — AB
Ketones, ur: NEGATIVE
Ketones, ur: NEGATIVE
Nitrite: NEGATIVE
Nitrite: NEGATIVE
Protein, ur: 100 — AB
Specific Gravity, Urine: 1.01
Specific Gravity, Urine: 1.01
Urobilinogen, UA: 0.2
Urobilinogen, UA: 0.2
pH: 7

## 2011-07-10 LAB — CBC
HCT: 35.2 — ABNORMAL LOW
HCT: 39
Hemoglobin: 11.3 — ABNORMAL LOW
Hemoglobin: 12.1
Hemoglobin: 12.6
MCHC: 32.3
MCHC: 32.5
MCV: 88.4
Platelets: 166
Platelets: 185
RBC: 4.14
RBC: 4.32
RBC: 4.47
RDW: 15.3 — ABNORMAL HIGH
RDW: 15.4 — ABNORMAL HIGH
RDW: 15.9 — ABNORMAL HIGH
WBC: 10.1
WBC: 5.8
WBC: 8

## 2011-07-10 LAB — BLOOD GAS, ARTERIAL
O2 Content: 3
TCO2: 27.1
pCO2 arterial: 59.9
pH, Arterial: 7.308 — ABNORMAL LOW

## 2011-07-10 LAB — COMPREHENSIVE METABOLIC PANEL
ALT: 16
AST: 21
Albumin: 3.2 — ABNORMAL LOW
Alkaline Phosphatase: 28 — ABNORMAL LOW
Glucose, Bld: 144 — ABNORMAL HIGH
Potassium: 4.9
Sodium: 141
Total Protein: 6.1

## 2011-07-10 LAB — BASIC METABOLIC PANEL
BUN: 19
CO2: 33 — ABNORMAL HIGH
CO2: 36 — ABNORMAL HIGH
Calcium: 8.8
Calcium: 9.2
Calcium: 9.3
Calcium: 9.7
Chloride: 102
Chloride: 109
Chloride: 98
Creatinine, Ser: 1.47 — ABNORMAL HIGH
Creatinine, Ser: 3.68 — ABNORMAL HIGH
GFR calc Af Amer: 16 — ABNORMAL LOW
GFR calc Af Amer: 47 — ABNORMAL LOW
GFR calc Af Amer: 54 — ABNORMAL LOW
GFR calc Af Amer: 9 — ABNORMAL LOW
GFR calc non Af Amer: 39 — ABNORMAL LOW
GFR calc non Af Amer: 45 — ABNORMAL LOW
GFR calc non Af Amer: 7 — ABNORMAL LOW
Glucose, Bld: 143 — ABNORMAL HIGH
Glucose, Bld: 150 — ABNORMAL HIGH
Glucose, Bld: 151 — ABNORMAL HIGH
Potassium: 4.4
Potassium: 4.4
Potassium: 5.1
Potassium: 5.7 — ABNORMAL HIGH
Sodium: 136
Sodium: 140
Sodium: 140
Sodium: 142
Sodium: 148 — ABNORMAL HIGH

## 2011-07-10 LAB — DIFFERENTIAL
Basophils Absolute: 0
Basophils Absolute: 0
Basophils Absolute: 0
Basophils Relative: 0
Basophils Relative: 0
Eosinophils Absolute: 0.1
Eosinophils Relative: 1
Eosinophils Relative: 1
Lymphocytes Relative: 24
Lymphocytes Relative: 30
Lymphocytes Relative: 9 — ABNORMAL LOW
Lymphs Abs: 0.9
Lymphs Abs: 1.1
Lymphs Abs: 1.4
Lymphs Abs: 2.4
Monocytes Absolute: 0.8 — ABNORMAL HIGH
Monocytes Relative: 12 — ABNORMAL HIGH
Monocytes Relative: 13 — ABNORMAL HIGH
Monocytes Relative: 9
Neutro Abs: 3.8
Neutro Abs: 4.1
Neutro Abs: 4.6
Neutrophils Relative %: 63
Neutrophils Relative %: 67
Neutrophils Relative %: 69

## 2011-07-10 LAB — LITHIUM LEVEL
Lithium Lvl: 0.54 — ABNORMAL LOW
Lithium Lvl: 1.21

## 2011-07-10 LAB — HEPATIC FUNCTION PANEL
AST: 19
Albumin: 2.8 — ABNORMAL LOW
Total Bilirubin: 0.6

## 2011-07-10 LAB — RENAL FUNCTION PANEL
CO2: 31
Calcium: 8.9
Chloride: 97
GFR calc Af Amer: >60
GFR calc non Af Amer: 58 — ABNORMAL LOW
Potassium: 3.8
Sodium: 133 — ABNORMAL LOW

## 2011-07-10 LAB — RAPID URINE DRUG SCREEN, HOSP PERFORMED
Amphetamines: NOT DETECTED
Barbiturates: NOT DETECTED
Benzodiazepines: POSITIVE — AB
Tetrahydrocannabinol: NOT DETECTED

## 2011-07-10 LAB — TSH: TSH: 0.384

## 2011-07-10 LAB — LIPASE, BLOOD: Lipase: 63 — ABNORMAL HIGH

## 2011-07-10 LAB — URINE MICROSCOPIC-ADD ON

## 2011-07-10 LAB — OSMOLALITY, URINE: Osmolality, Ur: 353 — ABNORMAL LOW

## 2011-07-11 LAB — URINE MICROSCOPIC-ADD ON

## 2011-07-11 LAB — DIFFERENTIAL
Basophils Absolute: 0
Lymphocytes Relative: 30
Lymphs Abs: 1.8
Monocytes Absolute: 0.8 — ABNORMAL HIGH
Neutro Abs: 3.4

## 2011-07-11 LAB — URINALYSIS, ROUTINE W REFLEX MICROSCOPIC
Glucose, UA: NEGATIVE
Specific Gravity, Urine: <1.005 — ABNORMAL LOW
Urobilinogen, UA: 0.2
pH: 6

## 2011-07-11 LAB — LITHIUM LEVEL: Lithium Lvl: 1.02

## 2011-07-11 LAB — CBC
Hemoglobin: 12.2
RDW: 16.3 — ABNORMAL HIGH
WBC: 6.1

## 2011-07-11 LAB — HEPATIC FUNCTION PANEL
AST: 22
Albumin: 3.3 — ABNORMAL LOW
Total Bilirubin: 0.8

## 2011-07-11 LAB — BASIC METABOLIC PANEL
Calcium: 10
GFR calc Af Amer: 27 — ABNORMAL LOW
GFR calc non Af Amer: 22 — ABNORMAL LOW
Glucose, Bld: 86
Sodium: 140

## 2011-07-11 LAB — VALPROIC ACID LEVEL: Valproic Acid Lvl: 55

## 2011-09-20 ENCOUNTER — Encounter (HOSPITAL_COMMUNITY): Payer: Self-pay | Admitting: *Deleted

## 2011-09-20 ENCOUNTER — Emergency Department (HOSPITAL_COMMUNITY)
Admission: EM | Admit: 2011-09-20 | Discharge: 2011-09-20 | Disposition: A | Discharge status: Home / Self Care | Payer: Medicaid Other | Attending: Emergency Medicine | Admitting: Emergency Medicine

## 2011-09-20 ENCOUNTER — Emergency Department (HOSPITAL_COMMUNITY): Payer: Medicaid Other

## 2011-09-20 DIAGNOSIS — Z79899 Other long term (current) drug therapy: Secondary | ICD-10-CM | POA: Insufficient documentation

## 2011-09-20 DIAGNOSIS — G20A1 Parkinson's disease without dyskinesia, without mention of fluctuations: Secondary | ICD-10-CM | POA: Insufficient documentation

## 2011-09-20 DIAGNOSIS — M5137 Other intervertebral disc degeneration, lumbosacral region: Secondary | ICD-10-CM | POA: Insufficient documentation

## 2011-09-20 DIAGNOSIS — R609 Edema, unspecified: Secondary | ICD-10-CM | POA: Insufficient documentation

## 2011-09-20 DIAGNOSIS — F410 Panic disorder [episodic paroxysmal anxiety] without agoraphobia: Secondary | ICD-10-CM | POA: Insufficient documentation

## 2011-09-20 DIAGNOSIS — M5432 Sciatica, left side: Secondary | ICD-10-CM

## 2011-09-20 DIAGNOSIS — G20 Parkinson's disease: Secondary | ICD-10-CM | POA: Insufficient documentation

## 2011-09-20 DIAGNOSIS — M51379 Other intervertebral disc degeneration, lumbosacral region without mention of lumbar back pain or lower extremity pain: Secondary | ICD-10-CM | POA: Insufficient documentation

## 2011-09-20 DIAGNOSIS — Z794 Long term (current) use of insulin: Secondary | ICD-10-CM | POA: Insufficient documentation

## 2011-09-20 DIAGNOSIS — Z7982 Long term (current) use of aspirin: Secondary | ICD-10-CM | POA: Insufficient documentation

## 2011-09-20 DIAGNOSIS — E119 Type 2 diabetes mellitus without complications: Secondary | ICD-10-CM | POA: Insufficient documentation

## 2011-09-20 DIAGNOSIS — R5383 Other fatigue: Secondary | ICD-10-CM | POA: Insufficient documentation

## 2011-09-20 DIAGNOSIS — J449 Chronic obstructive pulmonary disease, unspecified: Secondary | ICD-10-CM | POA: Insufficient documentation

## 2011-09-20 DIAGNOSIS — R569 Unspecified convulsions: Secondary | ICD-10-CM | POA: Insufficient documentation

## 2011-09-20 DIAGNOSIS — Z8673 Personal history of transient ischemic attack (TIA), and cerebral infarction without residual deficits: Secondary | ICD-10-CM | POA: Insufficient documentation

## 2011-09-20 DIAGNOSIS — Z9079 Acquired absence of other genital organ(s): Secondary | ICD-10-CM | POA: Insufficient documentation

## 2011-09-20 DIAGNOSIS — I10 Essential (primary) hypertension: Secondary | ICD-10-CM | POA: Insufficient documentation

## 2011-09-20 DIAGNOSIS — J4489 Other specified chronic obstructive pulmonary disease: Secondary | ICD-10-CM | POA: Insufficient documentation

## 2011-09-20 DIAGNOSIS — R5381 Other malaise: Secondary | ICD-10-CM | POA: Insufficient documentation

## 2011-09-20 DIAGNOSIS — R209 Unspecified disturbances of skin sensation: Secondary | ICD-10-CM | POA: Insufficient documentation

## 2011-09-20 DIAGNOSIS — M5430 Sciatica, unspecified side: Secondary | ICD-10-CM | POA: Insufficient documentation

## 2011-09-20 DIAGNOSIS — M5431 Sciatica, right side: Secondary | ICD-10-CM

## 2011-09-20 DIAGNOSIS — F411 Generalized anxiety disorder: Secondary | ICD-10-CM | POA: Insufficient documentation

## 2011-09-20 DIAGNOSIS — E669 Obesity, unspecified: Secondary | ICD-10-CM | POA: Insufficient documentation

## 2011-09-20 DIAGNOSIS — D66 Hereditary factor VIII deficiency: Secondary | ICD-10-CM | POA: Insufficient documentation

## 2011-09-20 MED ORDER — PREDNISONE 10 MG PO TABS: 20.0000 mg | ORAL_TABLET | Freq: Every day | ORAL | Status: AC

## 2011-09-20 MED ORDER — HYDROMORPHONE HCL PF 1 MG/ML IJ SOLN: 1.0000 mg | Freq: Once | INTRAMUSCULAR | Status: DC

## 2011-09-20 NOTE — ED Notes (Signed)
Pt brought in by [facility] for c/o not being able to feel her bilateral lower legs; pt states she woke up this am and was unable to get up and use her walker

## 2011-09-20 NOTE — ED Provider Notes (Signed)
This chart was scribed for [employer]. Colon Branch, MD by Williemae Natter. The patient was seen in room APA16A/APA16A and the patient's care was started at 11:35 AM.   CSN: [id number]  Arrival date & time 09/20/11  1037   None     Chief Complaint  Patient presents with  . Leg Pain    bilateral    (Consider location/radiation/quality/duration/timing/severity/associated sxs/prior treatment) HPI 11:35 AM [name] is a 45 y.o. female with a history of stroke, diabetes, COPD-oxygen dependant, and hypertension who presents to the Emergency Department complaining of acute onset numbness in both her legs. Pt reports waking up this morning and having lost all feeling in her legs with associated sharp stabbing pain down both buttock when she was getting off the couch to get to her walker. She states that she now has feeling but cannot put any strength on either of her legs. Pt denies any fever, chills, headache, weakness in upper extremities, or any changes in speech or swallowing.   Past Medical History  Diagnosis Date  . Diabetes mellitus   . Seizures   . Hemophilia   . Hypertension   . Anxiety   . Panic attacks   . Fear of     falling  . COPD (chronic obstructive pulmonary disease)   . Parkinson's disease     Past Surgical History  Procedure Date  . Abdominal hysterectomy   . Oophorectomy     History reviewed. No pertinent family history.  History  Substance Use Topics  . Smoking status: Never Smoker   . Smokeless tobacco: Not on file  . Alcohol Use: No    OB History    Grav Para Term Preterm Abortions TAB SAB Ect Mult Living                  Review of Systems 10 Systems reviewed and are negative for acute change except as noted in the HPI.  Allergies  Review of patient's allergies indicates no known allergies.  Home Medications   Current Outpatient Rx  Name Route Sig Dispense Refill  . ARIPIPRAZOLE 30 MG PO TABS Oral Take 30 mg by mouth every morning.        . ASPIRIN EC 81 MG PO TBEC Oral Take 81 mg by mouth every morning.      Marland Kitchen CALCIUM CARBONATE 600 MG PO TABS Oral Take 600 mg by mouth 2 (two) times daily with a meal.      . CLONAZEPAM 1 MG PO TABS Oral Take 1 mg by mouth 3 (three) times daily.      Marland Kitchen DIVALPROEX SODIUM 250 MG PO TBEC Oral Take 2,500 mg by mouth 3 (three) times daily. Take 10 tablets by mouth at bedtime     . DOCUSATE SODIUM 100 MG PO CAPS Oral Take 100 mg by mouth 3 (three) times daily.      Marland Kitchen GABAPENTIN 300 MG PO CAPS Oral Take 300 mg by mouth 3 (three) times daily.      Marland Kitchen GEMFIBROZIL 600 MG PO TABS Oral Take 600 mg by mouth 2 (two) times daily before a meal.      . GLIPIZIDE 10 MG PO TABS Oral Take 10 mg by mouth 2 (two) times daily before a meal.      . IBUPROFEN 800 MG PO TABS Oral Take 800 mg by mouth every 8 (eight) hours as needed. For pain      . INSULIN GLARGINE 100 UNIT/ML [place] SOLN Subcutaneous Inject 70  Units into the skin every morning.      . ISOSORBIDE MONONITRATE ER 60 MG PO TB24 Oral Take 60 mg by mouth every morning.      Marland Kitchen LISINOPRIL 10 MG PO TABS Oral Take 10 mg by mouth every morning.      Marland Kitchen METOPROLOL SUCCINATE ER 50 MG PO TB24 Oral Take 50 mg by mouth daily.      Marland Kitchen MINOXIDIL 2.5 MG PO TABS Oral Take 2.5 mg by mouth at bedtime.      Carma Leaven M PLUS PO TABS Oral Take 1 tablet by mouth every morning.      . OXYBUTYNIN CHLORIDE 5 MG PO TABS Oral Take 5 mg by mouth every morning.      Marland Kitchen PRAVASTATIN SODIUM 40 MG PO TABS Oral Take 40 mg by mouth at bedtime.      Marland Kitchen QUETIAPINE FUMARATE 300 MG PO TABS Oral Take 300 mg by mouth at bedtime.      . TRAMADOL HCL 50 MG PO TABS Oral Take 50 mg by mouth every 6 (six) hours as needed. For pain       BP 126/70  Pulse 109  Temp(Src) 99 F (37.2 C) (Oral)  Resp 20  Ht 5\' 8"  (1.727 m)  Wt 240 lb (108.863 kg)  BMI 36.49 kg/m2  SpO2 95%  Physical Exam  Nursing note and vitals reviewed. Constitutional: She is oriented to person, place, and time. She appears  well-developed and well-nourished.  HENT:  Head: Normocephalic and atraumatic.  Eyes: Conjunctivae and EOM are normal. Pupils are equal, round, and reactive to light.  Neck: Normal range of motion. Neck supple.  Cardiovascular: Normal rate, regular rhythm and normal heart sounds.  Exam reveals no gallop and no friction rub.   No murmur heard. Pulmonary/Chest: Effort normal and breath sounds normal.  Abdominal: Soft. Bowel sounds are normal.       obese  Musculoskeletal: She exhibits edema.       Able to hold her legs up against gravity Sensation to light touch is intact 2+ edema bilaterally   Neurological: She is alert and oriented to person, place, and time.  Skin: Skin is warm and dry.  Psychiatric: She has a normal mood and affect. Her behavior is normal.    ED Course  Procedures (including critical care time)  Dg Lumbar Spine Complete  09/20/2011  *RADIOLOGY REPORT*  Clinical Data: Pain and numbness  LUMBAR SPINE - COMPLETE 4+ VIEW  Comparison: None  Findings: Normal alignment of the lumbar spine.  Vertebral body heights are well preserved.  There is mild multilevel disc space narrowing and ventral spurring identified.  Most severe at the L5 S1 level.  No fractures or subluxations.  IMPRESSION:  1.  No acute findings. 2.  Degenerative disc disease.  Original Report Authenticated By: Rosealee Albee, M.D.   1500 Family at the bedside. Patient able to get up with assistance to bedside commode.   MDM  Obese patient with numbness, tingling ,weakness and sharp stabbing pain this morning when trying to get up off the cough to use her walker. Sensation now normal, numbness and tingling resolved. Patient  able to get up to bedside commode with assistance. Xrat with degenerative disc disease and no acute process. Pt feels improved after observation and/or treatment in ED.Pt stable in ED with no significant deterioration in condition.The patient appears reasonably screened and/or stabilized  for discharge and I doubt any other medical condition or other [facility] requiring further  screening, evaluation, or treatment in the ED at this time prior to discharge.  I personally performed the services described in this documentation, which was scribed in my presence. The recorded information has been reviewed and considered.  MDM Reviewed: nursing note and vitals Interpretation: x-ray          Nicoletta Dress. Colon Branch, MD 09/20/11 [phone]

## 2012-02-07 ENCOUNTER — Emergency Department (HOSPITAL_COMMUNITY): Payer: Medicaid Other

## 2012-02-07 ENCOUNTER — Encounter (HOSPITAL_COMMUNITY): Payer: Self-pay | Admitting: *Deleted

## 2012-02-07 ENCOUNTER — Emergency Department (HOSPITAL_COMMUNITY)
Admission: EM | Admit: 2012-02-07 | Discharge: 2012-02-07 | Disposition: A | Discharge status: Home / Self Care | Payer: Medicaid Other | Attending: Emergency Medicine | Admitting: Emergency Medicine

## 2012-02-07 DIAGNOSIS — W010XXA Fall on same level from slipping, tripping and stumbling without subsequent striking against object, initial encounter: Secondary | ICD-10-CM | POA: Insufficient documentation

## 2012-02-07 DIAGNOSIS — G20 Parkinson's disease: Secondary | ICD-10-CM | POA: Insufficient documentation

## 2012-02-07 DIAGNOSIS — R51 Headache: Secondary | ICD-10-CM | POA: Insufficient documentation

## 2012-02-07 DIAGNOSIS — S022XXA Fracture of nasal bones, initial encounter for closed fracture: Secondary | ICD-10-CM

## 2012-02-07 DIAGNOSIS — J3489 Other specified disorders of nose and nasal sinuses: Secondary | ICD-10-CM | POA: Insufficient documentation

## 2012-02-07 DIAGNOSIS — Z794 Long term (current) use of insulin: Secondary | ICD-10-CM | POA: Insufficient documentation

## 2012-02-07 DIAGNOSIS — M542 Cervicalgia: Secondary | ICD-10-CM | POA: Insufficient documentation

## 2012-02-07 DIAGNOSIS — J449 Chronic obstructive pulmonary disease, unspecified: Secondary | ICD-10-CM | POA: Insufficient documentation

## 2012-02-07 DIAGNOSIS — G20A1 Parkinson's disease without dyskinesia, without mention of fluctuations: Secondary | ICD-10-CM | POA: Insufficient documentation

## 2012-02-07 DIAGNOSIS — E119 Type 2 diabetes mellitus without complications: Secondary | ICD-10-CM | POA: Insufficient documentation

## 2012-02-07 DIAGNOSIS — I10 Essential (primary) hypertension: Secondary | ICD-10-CM | POA: Insufficient documentation

## 2012-02-07 DIAGNOSIS — M25519 Pain in unspecified shoulder: Secondary | ICD-10-CM | POA: Insufficient documentation

## 2012-02-07 DIAGNOSIS — J4489 Other specified chronic obstructive pulmonary disease: Secondary | ICD-10-CM | POA: Insufficient documentation

## 2012-02-07 DIAGNOSIS — W19XXXA Unspecified fall, initial encounter: Secondary | ICD-10-CM

## 2012-02-07 MED ORDER — OXYMETAZOLINE HCL 0.05 % NA SOLN
1.0000 | Freq: Once | NASAL | Status: AC
  Administered 2012-02-07: 1 via NASAL
  Filled 2012-02-07: qty 15

## 2012-02-07 MED ORDER — HYDROCODONE-ACETAMINOPHEN 5-325 MG PO TABS: 1.0000 | ORAL_TABLET | ORAL | Status: AC | PRN

## 2012-02-07 MED ORDER — ONDANSETRON HCL 4 MG PO TABS
4.0000 mg | ORAL_TABLET | Freq: Once | ORAL | Status: AC
  Administered 2012-02-07: 4 mg via ORAL
  Filled 2012-02-07: qty 1

## 2012-02-07 MED ORDER — OXYCODONE-ACETAMINOPHEN 5-325 MG PO TABS
2.0000 | ORAL_TABLET | Freq: Once | ORAL | Status: AC
  Administered 2012-02-07: 2 via ORAL
  Filled 2012-02-07: qty 2

## 2012-02-07 NOTE — ED Provider Notes (Signed)
History     CSN: [id number]  Arrival date & time 02/07/12  0029   First MD Initiated Contact with Patient 02/07/12 0043      Chief Complaint  Patient presents with  . Fall  . Facial Pain  . Shoulder Pain    (Consider location/radiation/quality/duration/timing/severity/associated sxs/prior treatment) HPI [name] is a 46 y.o. female brought in by ambulance, who presents to the Emergency Department complaining of  Fall in her home just PTA. Patient got out of bed and tripped over clutter in her bedroom as she was reaching for the light switch. She fell hitting her face on the floor. There was no loss of consciousness. She currently is complaining of pain to the nose, and the back of her neck. She was placed in a c-collar by EMS.  Past Medical History  Diagnosis Date  . Diabetes mellitus   . Seizures   . Hemophilia   . Hypertension   . Anxiety   . Panic attacks   . Fear of     falling  . COPD (chronic obstructive pulmonary disease)   . Parkinson's disease     Past Surgical History  Procedure Date  . Abdominal hysterectomy   . Oophorectomy     History reviewed. No pertinent family history.  History  Substance Use Topics  . Smoking status: Never Smoker   . Smokeless tobacco: Not on file  . Alcohol Use: No    OB History    Grav Para Term Preterm Abortions TAB SAB Ect Mult Living                  Review of Systems  Constitutional: Negative for fever.       10 Systems reviewed and are negative for acute change except as noted in the HPI.  HENT: Negative for congestion.        Nosebleed and nose pain. Neck pain  Eyes: Negative for discharge and redness.  Respiratory: Negative for cough and shortness of breath.   Cardiovascular: Negative for chest pain.  Gastrointestinal: Negative for vomiting and abdominal pain.  Musculoskeletal: Negative for back pain.  Skin: Negative for rash.  Neurological: Negative for syncope, numbness and headaches.    Psychiatric/Behavioral:       No behavior change.    Allergies  Review of patient's allergies indicates no known allergies.  Home Medications   Current Outpatient Rx  Name Route Sig Dispense Refill  . ARIPIPRAZOLE 30 MG PO TABS Oral Take 30 mg by mouth every morning.      . ASPIRIN EC 81 MG PO TBEC Oral Take 81 mg by mouth every morning.      Marland Kitchen CALCIUM CARBONATE 600 MG PO TABS Oral Take 600 mg by mouth 2 (two) times daily with a meal.      . CLONAZEPAM 1 MG PO TABS Oral Take 1 mg by mouth 3 (three) times daily.      Marland Kitchen DIVALPROEX SODIUM 250 MG PO TBEC Oral Take 2,500 mg by mouth 3 (three) times daily. Take 10 tablets by mouth at bedtime     . DOCUSATE SODIUM 100 MG PO CAPS Oral Take 100 mg by mouth 3 (three) times daily.      Marland Kitchen GABAPENTIN 300 MG PO CAPS Oral Take 300 mg by mouth 3 (three) times daily.      Marland Kitchen GEMFIBROZIL 600 MG PO TABS Oral Take 600 mg by mouth 2 (two) times daily before a meal.      .  GLIPIZIDE 10 MG PO TABS Oral Take 10 mg by mouth 2 (two) times daily before a meal.      . IBUPROFEN 800 MG PO TABS Oral Take 800 mg by mouth every 8 (eight) hours as needed. For pain      . INSULIN GLARGINE 100 UNIT/ML [place] SOLN Subcutaneous Inject 70 Units into the skin every morning.      . ISOSORBIDE MONONITRATE ER 60 MG PO TB24 Oral Take 60 mg by mouth every morning.      Marland Kitchen LISINOPRIL 10 MG PO TABS Oral Take 10 mg by mouth every morning.      Marland Kitchen METOPROLOL SUCCINATE ER 50 MG PO TB24 Oral Take 50 mg by mouth daily.      Marland Kitchen MINOXIDIL 2.5 MG PO TABS Oral Take 2.5 mg by mouth at bedtime.      Carma Leaven M PLUS PO TABS Oral Take 1 tablet by mouth every morning.      . OXYBUTYNIN CHLORIDE 5 MG PO TABS Oral Take 5 mg by mouth every morning.      Marland Kitchen PRAVASTATIN SODIUM 40 MG PO TABS Oral Take 40 mg by mouth at bedtime.      Marland Kitchen QUETIAPINE FUMARATE 300 MG PO TABS Oral Take 300 mg by mouth at bedtime.      . TRAMADOL HCL 50 MG PO TABS Oral Take 50 mg by mouth every 6 (six) hours as needed. For pain        BP 114/75  Pulse 90  Temp(Src) 98.5 F (36.9 C) (Oral)  Resp 20  SpO2 94%  Physical Exam  Nursing note and vitals reviewed. Constitutional: She is oriented to person, place, and time. She appears well-developed and well-nourished. No distress.       Awake, alert, nontoxic appearance.  HENT:  Head: Normocephalic.  Right Ear: External ear normal.  Left Ear: External ear normal.       Blood in mouth from nose bleed. NO loose teeth, no mouth trauma. Nose is swollen, dried blood  In both nares. Oozing from right nares. No facial deformities noted.   Eyes: Right eye exhibits no discharge. Left eye exhibits no discharge.  Neck:       c-collar in place.  Cardiovascular: Normal rate, normal heart sounds and intact distal pulses.   Pulmonary/Chest: Effort normal and breath sounds normal. She exhibits no tenderness.  Abdominal: Soft. Bowel sounds are normal. There is no tenderness. There is no rebound.  Musculoskeletal: She exhibits no tenderness.       Baseline ROM, no obvious new focal weakness.  Neurological: She is alert and oriented to person, place, and time.       Mental status and motor strength appears baseline for patient and situation.  Skin: Skin is warm and dry. No rash noted.  Psychiatric: She has a normal mood and affect.    ED Course  Procedures (including critical care time) Ct Head Wo Contrast  02/07/2012  *RADIOLOGY REPORT*  Clinical Data: Trauma.  The patient fell on the face.  Nose bleeds. History of hemophilia.  Facial pain and shoulder pain.  CT HEAD WITHOUT CONTRAST  Technique:  Contiguous axial images were obtained from the base of the skull through the vertex without contrast.  Comparison: To 04/05/2007  Findings: Diffuse cerebral atrophy.  Mild ventricular dilatation consistent with central atrophy.  No mass effect or midline shift. No abnormal extra-axial fluid collections.  Gray-white matter junctions are distinct.  Basal cisterns are not effaced.  No  evidence  of acute intracranial hemorrhage.  The visualized mastoid air cells and paranasal sinuses are not opacified.  No depressed skull fractures.  No significant change since previous study.  IMPRESSION: Chronic atrophy and small vessel ischemic change.  No acute intracranial abnormalities.  Original Report Authenticated By: Marlon Pel, M.D.   Ct Cervical Spine Wo Contrast  02/07/2012  *RADIOLOGY REPORT*  Clinical Data: Trauma.  CT CERVICAL SPINE WITHOUT CONTRAST  Technique:  Multidetector CT imaging of the cervical spine was performed. Multiplanar CT image reconstructions were also generated.  Comparison: CT head 06/23/2011  Findings: There is reversal of the usual cervical lordosis which is likely due to patient positioning but ligamentous injury or muscle spasm can also have this appearance and are not excluded.  Diffuse degenerative change throughout the cervical spine with narrowed cervical interspaces and associated endplate hypertrophic changes. Degenerative changes in the facet joints.  Normal alignment of the facet joints.  No vertebral compression deformities.  Lateral masses of C1 are symmetrical.  The odontoid process appears intact. No prevertebral soft tissue swelling or paraspinal soft tissue swelling.  No focal bone lesion or bone destruction.  Bone cortex and trabecular architecture appear intact.  IMPRESSION: Degenerative changes throughout the cervical spine.  Reversal of the usual cervical lordosis likely due to patient positioning but muscle spasm or ligamentous injury can also have this appearance. o displaced fractures identified.  Original Report Authenticated By: Marlon Pel, M.D.   Ct Maxillofacial Wo Cm  02/07/2012  *RADIOLOGY REPORT*  Clinical Data: Trauma.  Facial pain and nose bleed.  CT MAXILLOFACIAL WITHOUT CONTRAST  Technique:  Multidetector CT imaging of the maxillofacial structures was performed. Multiplanar CT image reconstructions were also generated.  Comparison:  CT head 06/23/2011  Findings: Depressed nasal bone fractures with fluid/hemorrhage and gas in the nasal passages and nasal pharynx.  This is new since the previous study.  The paranasal sinuses demonstrate minimal mucosal membrane thickening likely to represent a preexisting inflammatory change.  No acute air-fluid levels.  The globes and extraocular muscles appear intact and symmetrical.  The nasal septum is mildly displaced towards the left.  The orbital rims, maxillary antral walls, pterygoid plates, zygomatic arches, mandibles, and temporomandibular joints appear intact.  IMPRESSION: Acute depressed nasal bone fractures with gas and hemorrhage in the nasal passages and nasal pharynx.  No additional orbital or facial fractures identified.  Original Report Authenticated By: Marlon Pel, M.D.     MDM  Patient who fell in her bedroom sustaining a depressed nasal bone fracture. Bleeding is currently controlled. CT of the head and cervical spine without acute findings. Maxillofacial CT with only the nasal fracture present. Patient's been given Afrin, analgesic with mild relief.Pt stable in ED with no significant deterioration in condition.The patient appears reasonably screened and/or stabilized for discharge and I doubt any other medical condition or other [facility] requiring further screening, evaluation, or treatment in the ED at this time prior to discharge.  MDM Reviewed: nursing note and vitals Interpretation: CT scan           Nicoletta Dress. Colon Branch, MD 02/07/12 0430

## 2012-02-07 NOTE — Discharge Instructions (Signed)
CT scans of your head and neck were normal. The CT scan of the face shows a broken nose. Do not blow your nose. You may use salt water drops or spray to help with the feeling of congestion. He may gently clean the lower part of your nose with Q-tips. Apply ice for the swelling. You are most likely developed 2 black eyes. Use Tylenol for the pain medicine given you for discomfort.   Nasal Fracture A fracture is a break in the bone. A nasal fracture is a broken nose. Minor breaks do not need treatment. Serious breaks may need surgery.  HOME CARE  Put ice on the injured area.   Put ice in a plastic bag.   Place a towel between your skin and the bag.   Leave the ice on for 15 to 20 minutes, 3 to 4 times a day.   Only take medicine as told by your doctor.   If your nose bleeds, squeeze your nose shut gently. Sit upright for 10 minutes.   Do not play contact sports for 3 to 4 weeks or as told by your doctor.  GET HELP RIGHT AWAY IF:   You have more pain or severe pain.   You keep having nosebleeds.   The shape of your nose does not return to normal after 5 days.   You have yellowish white fluid (pus) coming from your nose.   Your nose bleeds for over 20 minutes.   Clear fluid drains from your nose.   You have a grape-like puffiness (swelling) on the inside of your nose.   You have trouble moving your eyes.   You keep throwing up (vomiting).  MAKE SURE YOU:   Understand these instructions.   Will watch this condition.   Will get help right away if you are not doing well or get worse.  Document Released: 06/20/2008 Document Revised: 08/31/2011 Document Reviewed: 12/26/2010 [employer] Patient Information 2012 [place], [place].

## 2012-02-07 NOTE — ED Notes (Signed)
Attempted to cut on light switch and fell face first to floor. Noted dried blood to face

## 2012-03-13 ENCOUNTER — Emergency Department (HOSPITAL_COMMUNITY): Payer: Medicaid Other

## 2012-03-13 ENCOUNTER — Encounter (HOSPITAL_COMMUNITY): Payer: Self-pay

## 2012-03-13 ENCOUNTER — Emergency Department (HOSPITAL_COMMUNITY)
Admission: EM | Admit: 2012-03-13 | Discharge: 2012-03-13 | Disposition: A | Discharge status: Home / Self Care | Payer: Medicaid Other | Attending: Emergency Medicine | Admitting: Emergency Medicine

## 2012-03-13 DIAGNOSIS — M79609 Pain in unspecified limb: Secondary | ICD-10-CM | POA: Insufficient documentation

## 2012-03-13 DIAGNOSIS — M549 Dorsalgia, unspecified: Secondary | ICD-10-CM | POA: Insufficient documentation

## 2012-03-13 DIAGNOSIS — Z90710 Acquired absence of both cervix and uterus: Secondary | ICD-10-CM | POA: Insufficient documentation

## 2012-03-13 DIAGNOSIS — R10819 Abdominal tenderness, unspecified site: Secondary | ICD-10-CM | POA: Insufficient documentation

## 2012-03-13 DIAGNOSIS — IMO0001 Reserved for inherently not codable concepts without codable children: Secondary | ICD-10-CM | POA: Insufficient documentation

## 2012-03-13 DIAGNOSIS — R109 Unspecified abdominal pain: Secondary | ICD-10-CM | POA: Insufficient documentation

## 2012-03-13 DIAGNOSIS — E119 Type 2 diabetes mellitus without complications: Secondary | ICD-10-CM | POA: Insufficient documentation

## 2012-03-13 DIAGNOSIS — F411 Generalized anxiety disorder: Secondary | ICD-10-CM | POA: Insufficient documentation

## 2012-03-13 DIAGNOSIS — Z794 Long term (current) use of insulin: Secondary | ICD-10-CM | POA: Insufficient documentation

## 2012-03-13 DIAGNOSIS — M7918 Myalgia, other site: Secondary | ICD-10-CM

## 2012-03-13 DIAGNOSIS — I10 Essential (primary) hypertension: Secondary | ICD-10-CM | POA: Insufficient documentation

## 2012-03-13 LAB — URINALYSIS, ROUTINE W REFLEX MICROSCOPIC
Glucose, UA: NEGATIVE mg/dL
Hgb urine dipstick: NEGATIVE
Specific Gravity, Urine: <1.005 — ABNORMAL LOW (ref 1.005–1.030)
Urobilinogen, UA: 0.2 mg/dL (ref 0.0–1.0)
pH: 5.5 (ref 5.0–8.0)

## 2012-03-13 LAB — POCT I-STAT, CHEM 8
Chloride: 103 mEq/L (ref 96–112)
Creatinine, Ser: 1 mg/dL (ref 0.50–1.10)
Hemoglobin: 12.6 g/dL (ref 12.0–15.0)
Potassium: 4.3 mEq/L (ref 3.5–5.1)
Sodium: 141 mEq/L (ref 135–145)

## 2012-03-13 MED ORDER — METHOCARBAMOL 500 MG PO TABS: 1000.0000 mg | ORAL_TABLET | Freq: Four times a day (QID) | ORAL | Status: AC | PRN

## 2012-03-13 MED ORDER — CYCLOBENZAPRINE HCL 10 MG PO TABS
10.0000 mg | ORAL_TABLET | Freq: Once | ORAL | Status: AC
  Administered 2012-03-13: 10 mg via ORAL
  Filled 2012-03-13: qty 1

## 2012-03-13 MED ORDER — KETOROLAC TROMETHAMINE 30 MG/ML IJ SOLN
30.0000 mg | Freq: Once | INTRAMUSCULAR | Status: AC
  Administered 2012-03-13: 30 mg via INTRAVENOUS
  Filled 2012-03-13: qty 1

## 2012-03-13 MED ORDER — SODIUM CHLORIDE 0.9 % IV SOLN
Freq: Once | INTRAVENOUS | Status: AC
  Administered 2012-03-13: 1000 mL via INTRAVENOUS

## 2012-03-13 MED ORDER — KETOROLAC TROMETHAMINE 60 MG/2ML IM SOLN: 60.0000 mg | Freq: Once | INTRAMUSCULAR | Status: DC

## 2012-03-13 MED ORDER — NAPROXEN 500 MG PO TABS: 500.0000 mg | ORAL_TABLET | Freq: Two times a day (BID) | ORAL | Status: AC

## 2012-03-13 NOTE — ED Notes (Signed)
Pt refused another in and out cath.  Dr. Lynelle Doctor aware.

## 2012-03-13 NOTE — ED Notes (Signed)
Pt c/o sudden onset of rlq pain radiating to back and down r leg since 1pm.  Denies any vomiting or diarrhea.  Pt says had large bm this morning, c/o nausea.

## 2012-03-13 NOTE — ED Provider Notes (Signed)
History   This chart was scribed for Ward Givens, MD by Charolett Bumpers . The patient was seen in room APA03/APA03.    CSN: [id number]  Arrival date & time 03/13/12  1548   First MD Initiated Contact with Patient 03/13/12 1555      Chief Complaint  Patient presents with  . Abdominal Pain    (Consider location/radiation/quality/duration/timing/severity/associated sxs/prior treatment) HPI [name] is a 46 y.o. female who presents to the Emergency Department via EMS complaining of intermittent, moderate right sided, sharp, shooting pains in her RLQ that radiate to her lower back and right leg that started today. Patient states that she was watching t.v. When the symptoms started. Patient states that the pain started in her RLQ abdominal area and then radiated to her lower back. Patient states that the symptoms last for 15-20 minutes at a time. Patient states that she took Ibuprofen for her symptoms with some relief. Patient states that her symptoms are aggravated with movement and sitting up. Patient denies n/v, diaphoresis, diarrhea, constipation, and dysuria. Patient states that she last had a BM this morning and was not aware she had to have a BM. Patient denies any h/o incontinence. Patient states that her back pain is located where her typical back is but states that today's back pain is worse. Patient denies any recent injuries or increased activity. Patient states that she does physical therapy regularly. Patient states that her blood sugar levels were 123 this morning. Patient denies smoking. Patient states that she is on oxygen for her COPD. Patient reports a h/o hysterectomy and an oophorectomy (single)  several years ago. She does not know which ovary she still has.  Patient denies any h/o similar abdominal pain.   PCP Dr Pearletha Furl, NP  Past Medical History  Diagnosis Date  . Diabetes mellitus   . Seizures   . Hemophilia   . Hypertension   . Anxiety   . Panic  attacks   . Fear of     falling  . COPD (chronic obstructive pulmonary disease)   . Parkinson's disease   Nasal fx in May  Past Surgical History  Procedure Date  . Abdominal hysterectomy   . Oophorectomy     No family history on file.  History  Substance Use Topics  . Smoking status: Never Smoker   . Smokeless tobacco: Not on file  . Alcohol Use: No  lives at home Lives with husband CPAP at night Oxygen at night Second hand smoke  OB History    Grav Para Term Preterm Abortions TAB SAB Ect Mult Living                  Review of Systems  Gastrointestinal: Positive for abdominal pain. Negative for nausea, vomiting, diarrhea and constipation.  Genitourinary: Positive for frequency. Negative for dysuria.  Musculoskeletal: Positive for back pain.       Right leg pain.   All other systems reviewed and are negative.    Allergies  Review of patient's allergies indicates no known allergies.  Home Medications   Current Outpatient Rx  Name Route Sig Dispense Refill  . ARIPIPRAZOLE 30 MG PO TABS Oral Take 30 mg by mouth every morning.      . ASPIRIN EC 81 MG PO TBEC Oral Take 81 mg by mouth every morning.      Marland Kitchen CALCIUM CARBONATE 600 MG PO TABS Oral Take 600 mg by mouth 2 (two) times daily with a meal.      .  CLONAZEPAM 1 MG PO TABS Oral Take 1 mg by mouth 3 (three) times daily.      Marland Kitchen DIVALPROEX SODIUM 250 MG PO TBEC Oral Take 2,500 mg by mouth 3 (three) times daily. Take 10 tablets by mouth at bedtime     . DOCUSATE SODIUM 100 MG PO CAPS Oral Take 100 mg by mouth 3 (three) times daily.      Marland Kitchen GABAPENTIN 300 MG PO CAPS Oral Take 300 mg by mouth 3 (three) times daily.      Marland Kitchen GEMFIBROZIL 600 MG PO TABS Oral Take 600 mg by mouth 2 (two) times daily before a meal.      . GLIPIZIDE 10 MG PO TABS Oral Take 10 mg by mouth 2 (two) times daily before a meal.      . IBUPROFEN 800 MG PO TABS Oral Take 800 mg by mouth every 8 (eight) hours as needed. For pain      . INSULIN  GLARGINE 100 UNIT/ML [place] SOLN Subcutaneous Inject 70 Units into the skin every morning.      . ISOSORBIDE MONONITRATE ER 60 MG PO TB24 Oral Take 60 mg by mouth every morning.      Marland Kitchen LISINOPRIL 10 MG PO TABS Oral Take 10 mg by mouth every morning.      Marland Kitchen METOPROLOL SUCCINATE ER 50 MG PO TB24 Oral Take 50 mg by mouth daily.      Marland Kitchen MINOXIDIL 2.5 MG PO TABS Oral Take 2.5 mg by mouth at bedtime.      Carma Leaven M PLUS PO TABS Oral Take 1 tablet by mouth every morning.      . OXYBUTYNIN CHLORIDE 5 MG PO TABS Oral Take 5 mg by mouth every morning.      Marland Kitchen PRAVASTATIN SODIUM 40 MG PO TABS Oral Take 40 mg by mouth at bedtime.      Marland Kitchen QUETIAPINE FUMARATE 300 MG PO TABS Oral Take 300 mg by mouth at bedtime.      . TRAMADOL HCL 50 MG PO TABS Oral Take 50 mg by mouth every 6 (six) hours as needed. For pain       BP 113/63  Pulse 82  Temp 98.8 F (37.1 C) (Oral)  Resp 18  Ht 5\' 8"  (1.727 m)  Wt 240 lb (108.863 kg)  BMI 36.49 kg/m2  SpO2 93%  Vital signs normal    Physical Exam  Nursing note and vitals reviewed. Constitutional: She is oriented to person, place, and time. She appears well-developed and well-nourished.  Non-toxic appearance. She does not appear ill. No distress.       Obese in NAD  HENT:  Head: Normocephalic and atraumatic.  Right Ear: External ear normal.  Left Ear: External ear normal.  Nose: Nose normal. No mucosal edema or rhinorrhea.  Mouth/Throat: Oropharynx is clear and moist and mucous membranes are normal. No dental abscesses or uvula swelling.       Tongue was dry.   Eyes: Conjunctivae and EOM are normal. Pupils are equal, round, and reactive to light.  Neck: Normal range of motion and full passive range of motion without pain. Neck supple. No tracheal deviation present.  Cardiovascular: Normal rate, regular rhythm, normal heart sounds and intact distal pulses.  Exam reveals no gallop and no friction rub.   No murmur heard. Pulmonary/Chest: Effort normal and breath sounds  normal. No respiratory distress. She has no wheezes. She has no rhonchi. She has no rales. She exhibits no tenderness and no  crepitus.  Abdominal: Soft. Normal appearance and bowel sounds are normal. She exhibits no distension. There is generalized tenderness. There is no rebound and no guarding.    Musculoskeletal: Normal range of motion. She exhibits no edema and no tenderness.       Diffusely tender on right flank with pain on ROM on the waist. Midline cervical, thoracic, and lumbar were non-tender. No SLR pain.   Neurological: She is alert and oriented to person, place, and time. She has normal strength. No cranial nerve deficit or sensory deficit.  Skin: Skin is warm, dry and intact. No rash noted. No erythema. No pallor.  Psychiatric: Her speech is normal and behavior is normal. Her mood appears not anxious.       Seems anxious    ED Course  Procedures (including critical care time)   Medications  cyclobenzaprine (FLEXERIL) tablet 10 mg (10 mg Oral Given 03/13/12 1705)  0.9 %  sodium chloride infusion (1000 mL Intravenous New Bag/Given 03/13/12 1705)  ketorolac (TORADOL) 30 MG/ML injection 30 mg (30 mg Intravenous Given 03/13/12 1705)    DIAGNOSTIC STUDIES: Oxygen Saturation is 93% on room air, adequate by my interpretation.    COORDINATION OF CARE:  1615: Medication Orders: Ketorolac (Toradol) injection 60 mg-once 1617: Discussed planned course of treatment with the patient, who is agreeable at this time.  1645: Medication Orders: 0.9% sodium chloride infusion-once 1700: Medication Orders: Ketorolac (Toradol) 30 mg/mL injection 30 mg-once 1834: Recheck: Informed patient imaging and lab results. Discussed d/c, patient was agreeable.  Pt sitting up in bed chatting with family in NAD, smiling.   Results for orders placed during the hospital encounter of 03/13/12  URINALYSIS, ROUTINE W REFLEX MICROSCOPIC      Component Value Range   Color, Urine STRAW (*) YELLOW   APPearance  CLEAR  CLEAR   Specific Gravity, Urine <1.005 (*) 1.005 - 1.030   pH 5.5  5.0 - 8.0   Glucose, UA NEGATIVE  NEGATIVE mg/dL   Hgb urine dipstick NEGATIVE  NEGATIVE   Bilirubin Urine NEGATIVE  NEGATIVE   Ketones, ur NEGATIVE  NEGATIVE mg/dL   Protein, ur NEGATIVE  NEGATIVE mg/dL   Urobilinogen, UA 0.2  0.0 - 1.0 mg/dL   Nitrite NEGATIVE  NEGATIVE   Leukocytes, UA NEGATIVE  NEGATIVE  POCT I-STAT, CHEM 8      Component Value Range   Sodium 141  135 - 145 mEq/L   Potassium 4.3  3.5 - 5.1 mEq/L   Chloride 103  96 - 112 mEq/L   BUN 29 (*) 6 - 23 mg/dL   Creatinine, Ser 1.61  0.50 - 1.10 mg/dL   Glucose, Bld 66 (*) 70 - 99 mg/dL   Calcium, Ion 0.96  0.45 - 1.32 mmol/L   TCO2 26  0 - 100 mmol/L   Hemoglobin 12.6  12.0 - 15.0 g/dL   HCT 40.9  81.1 - 91.4 %   Laboratory interpretation all normal except low glucose (pt given food)   Ct Abdomen Pelvis Wo Contrast  03/13/2012  *RADIOLOGY REPORT*  Clinical Data: Abdominal pain  CT ABDOMEN AND PELVIS WITHOUT CONTRAST  Technique:  Multidetector CT imaging of the abdomen and pelvis was performed following the standard protocol without intravenous contrast.  Comparison: 09/12/2010  Findings: The lungs are clear.  No pericardial or pleural effusion.  There is no suspicious liver abnormality.  The gallbladder appears normal.  No biliary dilatation.  The pancreas is normal.  Normal appearance of the spleen.  The  stomach and the small bowel loops are unremarkable.  No abnormal small bowel dilatation identified. The appendix is not visualized separate from the right lower quadrant bowel loops.  No secondary signs of acute appendicitis. The colon appears within normal limits.  The adrenal glands are both normal  Cyst arising from the upper pole of the left kidney is incompletely characterized without IV contrast.  The right kidney is normal.  The uterus is surgically absent. The left ovary is enlarged measuring  7.8 x 4.3 cm, image 66.  Previously 8.1 x 5.3cm.  Right ovary measures 10.4 x 3.8 cm, image 68.  Previously 3.8 x 4.2 cm.  Calcifications involving the abdominal aorta and its branches noted.  No free fluid within the abdomen or pelvis noted.  There are small periumbilical hernias which contain fat only.  Review of the visualized osseous structures is significant for lumbar degenerative disc disease.  IMPRESSION:  1.  No acute findings within the abdomen or pelvis. 2.  Persistent enlarged left ovary which is slightly decreased in size from previous exam.  This would be better assessed with pelvic sonography.  Original Report Authenticated By: Rosealee Albee, M.D.    1. Back pain   2. Musculoskeletal pain    New Prescriptions   METHOCARBAMOL (ROBAXIN) 500 MG TABLET    Take 2 tablets (1,000 mg total) by mouth 4 (four) times daily as needed.   NAPROXEN (NAPROSYN) 500 MG TABLET    Take 1 tablet (500 mg total) by mouth 2 (two) times daily.   Plan discharge  Devoria Albe, MD, FACEP    MDM   I personally performed the services described in this documentation, which was scribed in my presence. The recorded information has been reviewed and considered. Devoria Albe, MD, FACEP        Ward Givens, MD 03/13/12 [phone]

## 2012-03-13 NOTE — ED Notes (Signed)
Per ems, pt called out for sharp, shooting RLQ pain that began today.  Pt reports some nausea today but denies vomiting.

## 2012-03-13 NOTE — ED Notes (Signed)
Deanna Mabe NT and [employer] RN attempted in and out cath but was unusuccessful, says catheter tube was not long enough.  Will try again with foley cath.

## 2012-03-13 NOTE — ED Notes (Signed)
Left in c/o family for transport home; alert, in no distress; instructions/prescriptions reviewed and f/u information provided.  Verbalizes understanding of instructions given.

## 2012-03-13 NOTE — ED Notes (Signed)
Patient is comfortable at this time. 

## 2012-03-13 NOTE — ED Notes (Signed)
Crackers and peanutbutter given to pt to eat because glucose was 66 on ISTAT.  Dr. Lynelle Doctor aware.

## 2012-03-13 NOTE — ED Notes (Signed)
Registration at bedside.

## 2012-03-13 NOTE — Discharge Instructions (Signed)
You are having sore muscle pain. Take the medications prescribed. You can recheck with your health care provider, Ms Gusler, if you continue to have pain. Return to the ED if you get a fever, vomiting or seem worse. Continue your physical therapy exercises.

## 2012-03-21 ENCOUNTER — Other Ambulatory Visit (HOSPITAL_COMMUNITY): Payer: Self-pay | Admitting: Family Medicine

## 2012-03-21 DIAGNOSIS — Z139 Encounter for screening, unspecified: Secondary | ICD-10-CM

## 2012-03-29 ENCOUNTER — Ambulatory Visit (HOSPITAL_COMMUNITY): Payer: Medicaid Other

## 2012-06-11 ENCOUNTER — Ambulatory Visit (HOSPITAL_COMMUNITY): Payer: Medicaid Other

## 2014-04-11 ENCOUNTER — Emergency Department (HOSPITAL_COMMUNITY): Payer: Medicaid Other

## 2014-04-11 ENCOUNTER — Inpatient Hospital Stay (HOSPITAL_COMMUNITY)
Admission: EM | Admit: 2014-04-11 | Discharge: 2014-04-20 | DRG: 388 | Disposition: A | Discharge status: Home Health | Payer: Medicaid Other | Attending: Internal Medicine | Admitting: Internal Medicine

## 2014-04-11 ENCOUNTER — Encounter (HOSPITAL_COMMUNITY): Payer: Self-pay | Admitting: Emergency Medicine

## 2014-04-11 DIAGNOSIS — E874 Mixed disorder of acid-base balance: Secondary | ICD-10-CM | POA: Diagnosis not present

## 2014-04-11 DIAGNOSIS — E119 Type 2 diabetes mellitus without complications: Secondary | ICD-10-CM | POA: Diagnosis present

## 2014-04-11 DIAGNOSIS — J449 Chronic obstructive pulmonary disease, unspecified: Secondary | ICD-10-CM | POA: Diagnosis present

## 2014-04-11 DIAGNOSIS — J9601 Acute respiratory failure with hypoxia: Secondary | ICD-10-CM

## 2014-04-11 DIAGNOSIS — Z6841 Body Mass Index (BMI) 40.0 and over, adult: Secondary | ICD-10-CM

## 2014-04-11 DIAGNOSIS — Z7982 Long term (current) use of aspirin: Secondary | ICD-10-CM

## 2014-04-11 DIAGNOSIS — F319 Bipolar disorder, unspecified: Secondary | ICD-10-CM | POA: Diagnosis present

## 2014-04-11 DIAGNOSIS — N179 Acute kidney failure, unspecified: Secondary | ICD-10-CM

## 2014-04-11 DIAGNOSIS — Z794 Long term (current) use of insulin: Secondary | ICD-10-CM | POA: Diagnosis not present

## 2014-04-11 DIAGNOSIS — K56609 Unspecified intestinal obstruction, unspecified as to partial versus complete obstruction: Secondary | ICD-10-CM | POA: Diagnosis present

## 2014-04-11 DIAGNOSIS — E875 Hyperkalemia: Secondary | ICD-10-CM | POA: Diagnosis not present

## 2014-04-11 DIAGNOSIS — F410 Panic disorder [episodic paroxysmal anxiety] without agoraphobia: Secondary | ICD-10-CM | POA: Diagnosis present

## 2014-04-11 DIAGNOSIS — F411 Generalized anxiety disorder: Secondary | ICD-10-CM | POA: Diagnosis present

## 2014-04-11 DIAGNOSIS — D66 Hereditary factor VIII deficiency: Secondary | ICD-10-CM | POA: Diagnosis present

## 2014-04-11 DIAGNOSIS — J4489 Other specified chronic obstructive pulmonary disease: Secondary | ICD-10-CM | POA: Diagnosis present

## 2014-04-11 DIAGNOSIS — R6521 Severe sepsis with septic shock: Secondary | ICD-10-CM

## 2014-04-11 DIAGNOSIS — A498 Other bacterial infections of unspecified site: Secondary | ICD-10-CM | POA: Diagnosis present

## 2014-04-11 DIAGNOSIS — T50995A Adverse effect of other drugs, medicaments and biological substances, initial encounter: Secondary | ICD-10-CM | POA: Diagnosis present

## 2014-04-11 DIAGNOSIS — N170 Acute kidney failure with tubular necrosis: Secondary | ICD-10-CM | POA: Diagnosis present

## 2014-04-11 DIAGNOSIS — N390 Urinary tract infection, site not specified: Secondary | ICD-10-CM | POA: Diagnosis present

## 2014-04-11 DIAGNOSIS — R1032 Left lower quadrant pain: Secondary | ICD-10-CM | POA: Diagnosis not present

## 2014-04-11 DIAGNOSIS — I498 Other specified cardiac arrhythmias: Secondary | ICD-10-CM | POA: Diagnosis not present

## 2014-04-11 DIAGNOSIS — E860 Dehydration: Secondary | ICD-10-CM | POA: Diagnosis present

## 2014-04-11 DIAGNOSIS — A419 Sepsis, unspecified organism: Secondary | ICD-10-CM | POA: Diagnosis not present

## 2014-04-11 DIAGNOSIS — G40909 Epilepsy, unspecified, not intractable, without status epilepticus: Secondary | ICD-10-CM | POA: Diagnosis present

## 2014-04-11 DIAGNOSIS — E861 Hypovolemia: Secondary | ICD-10-CM | POA: Diagnosis present

## 2014-04-11 DIAGNOSIS — G9340 Encephalopathy, unspecified: Secondary | ICD-10-CM | POA: Diagnosis not present

## 2014-04-11 DIAGNOSIS — I10 Essential (primary) hypertension: Secondary | ICD-10-CM | POA: Diagnosis present

## 2014-04-11 DIAGNOSIS — R6520 Severe sepsis without septic shock: Secondary | ICD-10-CM

## 2014-04-11 DIAGNOSIS — J9600 Acute respiratory failure, unspecified whether with hypoxia or hypercapnia: Secondary | ICD-10-CM | POA: Diagnosis not present

## 2014-04-11 DIAGNOSIS — G4733 Obstructive sleep apnea (adult) (pediatric): Secondary | ICD-10-CM | POA: Diagnosis present

## 2014-04-11 DIAGNOSIS — G20 Parkinson's disease: Secondary | ICD-10-CM | POA: Diagnosis present

## 2014-04-11 DIAGNOSIS — E876 Hypokalemia: Secondary | ICD-10-CM | POA: Diagnosis not present

## 2014-04-11 DIAGNOSIS — Z833 Family history of diabetes mellitus: Secondary | ICD-10-CM | POA: Diagnosis not present

## 2014-04-11 DIAGNOSIS — K565 Intestinal adhesions [bands], unspecified as to partial versus complete obstruction: Secondary | ICD-10-CM | POA: Diagnosis not present

## 2014-04-11 DIAGNOSIS — G20A1 Parkinson's disease without dyskinesia, without mention of fluctuations: Secondary | ICD-10-CM | POA: Diagnosis present

## 2014-04-11 DIAGNOSIS — Z9981 Dependence on supplemental oxygen: Secondary | ICD-10-CM

## 2014-04-11 DIAGNOSIS — F3170 Bipolar disorder, currently in remission, most recent episode unspecified: Secondary | ICD-10-CM

## 2014-04-11 LAB — CBC
HEMATOCRIT: 44 % (ref 36.0–46.0)
HEMOGLOBIN: 14.8 g/dL (ref 12.0–15.0)
MCH: 28.4 pg (ref 26.0–34.0)
MCHC: 33.6 g/dL (ref 30.0–36.0)
MCV: 84.5 fL (ref 78.0–100.0)
Platelets: 230 10*3/uL (ref 150–400)
RBC: 5.21 MIL/uL — ABNORMAL HIGH (ref 3.87–5.11)
RDW: 13.3 % (ref 11.5–15.5)
WBC: 5.2 10*3/uL (ref 4.0–10.5)

## 2014-04-11 LAB — URINE MICROSCOPIC-ADD ON

## 2014-04-11 LAB — COMPREHENSIVE METABOLIC PANEL
ALBUMIN: 3.8 g/dL (ref 3.5–5.2)
ALK PHOS: 109 U/L (ref 39–117)
ALT: 13 U/L (ref 0–35)
ANION GAP: 15 (ref 5–15)
AST: 17 U/L (ref 0–37)
BUN: 39 mg/dL — AB (ref 6–23)
CALCIUM: 10.6 mg/dL — AB (ref 8.4–10.5)
CO2: 34 mEq/L — ABNORMAL HIGH (ref 19–32)
Chloride: 87 mEq/L — ABNORMAL LOW (ref 96–112)
Creatinine, Ser: 1.62 mg/dL — ABNORMAL HIGH (ref 0.50–1.10)
GFR calc non Af Amer: 36 mL/min — ABNORMAL LOW (ref >90)
GFR, EST AFRICAN AMERICAN: 42 mL/min — AB (ref >90)
GLUCOSE: 196 mg/dL — AB (ref 70–99)
POTASSIUM: 4.1 meq/L (ref 3.7–5.3)
Sodium: 136 mEq/L — ABNORMAL LOW (ref 137–147)
TOTAL PROTEIN: 7.9 g/dL (ref 6.0–8.3)
Total Bilirubin: 0.4 mg/dL (ref 0.3–1.2)

## 2014-04-11 LAB — LIPASE, BLOOD: LIPASE: 32 U/L (ref 11–59)

## 2014-04-11 LAB — URINALYSIS, ROUTINE W REFLEX MICROSCOPIC
Glucose, UA: NEGATIVE mg/dL
Hgb urine dipstick: NEGATIVE
KETONES UR: 15 mg/dL — AB
NITRITE: NEGATIVE
PROTEIN: 100 mg/dL — AB
Specific Gravity, Urine: 1.02 (ref 1.005–1.030)
UROBILINOGEN UA: 0.2 mg/dL (ref 0.0–1.0)
pH: 6 (ref 5.0–8.0)

## 2014-04-11 LAB — I-STAT CG4 LACTIC ACID, ED: LACTIC ACID, VENOUS: 2.37 mmol/L — AB (ref 0.5–2.2)

## 2014-04-11 MED ORDER — SODIUM CHLORIDE 0.9 % IV BOLUS (SEPSIS)
500.0000 mL | Freq: Once | INTRAVENOUS | Status: AC
  Administered 2014-04-11: 1000 mL via INTRAVENOUS

## 2014-04-11 MED ORDER — ONDANSETRON HCL 4 MG/2ML IJ SOLN
4.0000 mg | Freq: Once | INTRAMUSCULAR | Status: AC
  Administered 2014-04-11: 4 mg via INTRAVENOUS
  Filled 2014-04-11: qty 2

## 2014-04-11 MED ORDER — IOHEXOL 300 MG/ML  SOLN
50.0000 mL | Freq: Once | INTRAMUSCULAR | Status: AC | PRN
  Administered 2014-04-11: 50 mL via ORAL

## 2014-04-11 MED ORDER — IOHEXOL 300 MG/ML  SOLN
80.0000 mL | Freq: Once | INTRAMUSCULAR | Status: AC | PRN
  Administered 2014-04-11: 80 mL via INTRAVENOUS

## 2014-04-11 MED ORDER — SODIUM CHLORIDE 0.9 % IV SOLN: INTRAVENOUS | Status: DC

## 2014-04-11 NOTE — ED Provider Notes (Signed)
CSN: [id number]     Arrival date & time 04/11/14  1705 History   First MD Initiated Contact with Patient 04/11/14 1708     This chart was scribed for Gilda Crease, * by Arlan Organ, ED Scribe. This patient was seen in room [facility]18[facility]18 and the patient's care was started 5:21 PM.   Chief Complaint  Patient presents with  . Abdominal Pain   The history is provided by the patient. No language interpreter was used.    HPI Comments: [name] is a 48 y.o. female with a PMHx of DM, HTN, parkinson's disease and COPD who presents to the Emergency Department complaining of constant, moderate LLQ abdominal pain onset last night that has now resolved. States she initially noted the pain after eating dinner last night. She also reports nausea and vomiting that has been ongoing since onset of abdominal pain. She admits to 2 episodes of vomiting in last several hours. At this time she denies any fever or chills. Pt states she typically uses oxygen at home and states her breathing is unchanged at this time. No known allergies to medications. She has no other pertinent past medical history. No other concerns this visit.    Past Medical History  Diagnosis Date  . Diabetes mellitus   . Seizures   . Hemophilia   . Hypertension   . Anxiety   . Panic attacks   . Fear of     falling  . COPD (chronic obstructive pulmonary disease)   . Parkinson's disease    Past Surgical History  Procedure Laterality Date  . Abdominal hysterectomy    . Oophorectomy     No family history on file. History  Substance Use Topics  . Smoking status: Never Smoker   . Smokeless tobacco: Not on file  . Alcohol Use: No   OB History   Grav Para Term Preterm Abortions TAB SAB Ect Mult Living                 Review of Systems  Constitutional: Negative for fever and chills.  Respiratory: Positive for shortness of breath. Negative for cough.   Gastrointestinal: Positive for nausea, vomiting and abdominal  pain.  Skin: Negative for rash.  Psychiatric/Behavioral: Negative for confusion.      Allergies  Review of patient's allergies indicates no known allergies.  Home Medications   Prior to Admission medications   Medication Sig Start Date End Date Taking? Authorizing Provider  ARIPiprazole (ABILIFY) 30 MG tablet Take 30 mg by mouth every morning.      Historical Provider, MD  aspirin EC 81 MG tablet Take 81 mg by mouth every morning.      Historical Provider, MD  calcium carbonate (OS-CAL) 600 MG TABS Take 600 mg by mouth 2 (two) times daily with a meal.      Historical Provider, MD  clonazePAM (KLONOPIN) 1 MG tablet Take 1 mg by mouth 3 (three) times daily.      Historical Provider, MD  divalproex (DEPAKOTE) 250 MG DR tablet Take 2,500 mg by mouth at bedtime. Take 10 tablets at bedtime    Historical Provider, MD  docusate sodium (COLACE) 100 MG capsule Take 100 mg by mouth 3 (three) times daily.      Historical Provider, MD  gabapentin (NEURONTIN) 300 MG capsule Take 900 mg by mouth daily at 12 noon.     Historical Provider, MD  gemfibrozil (LOPID) 600 MG tablet Take 600 mg by mouth 2 (two) times  daily before a meal.      Historical Provider, MD  glipiZIDE (GLUCOTROL) 10 MG tablet Take 10 mg by mouth 2 (two) times daily before a meal.      Historical Provider, MD  ibuprofen (ADVIL,MOTRIN) 800 MG tablet Take 800 mg by mouth every 8 (eight) hours as needed. For pain      Historical Provider, MD  insulin glargine (LANTUS) 100 UNIT/ML injection Inject 70 Units into the skin every morning.      Historical Provider, MD  isosorbide mononitrate (IMDUR) 60 MG 24 hr tablet Take 60 mg by mouth every morning.      Historical Provider, MD  lisinopril (PRINIVIL,ZESTRIL) 10 MG tablet Take 10 mg by mouth every morning.      Historical Provider, MD  metoprolol (TOPROL-XL) 50 MG 24 hr tablet Take 50 mg by mouth every morning.     Historical Provider, MD  minoxidil (LONITEN) 2.5 MG tablet Take 2.5 mg by  mouth at bedtime.      Historical Provider, MD  Multiple Vitamins-Minerals (MULTIVITAMINS THER. W/MINERALS) TABS Take 1 tablet by mouth every morning.      Historical Provider, MD  oxybutynin (DITROPAN) 5 MG tablet Take 5 mg by mouth every morning.      Historical Provider, MD  pravastatin (PRAVACHOL) 40 MG tablet Take 40 mg by mouth at bedtime.      Historical Provider, MD  QUEtiapine (SEROQUEL) 100 MG tablet Take 100 mg by mouth at bedtime.    Historical Provider, MD  traMADol (ULTRAM) 50 MG tablet Take 50 mg by mouth at bedtime. For pain    Historical Provider, MD   Triage Vitals: BP 131/85  Pulse 124  Temp(Src) 99.9 F (37.7 C) (Oral)  Resp 22  Ht 5\' 8"  (1.727 m)  Wt 220 lb (99.791 kg)  BMI 33.46 kg/m2  SpO2 95%   Physical Exam  Constitutional: She is oriented to person, place, and time. She appears well-developed and well-nourished. No distress.  HENT:  Head: Normocephalic and atraumatic.  Right Ear: Hearing normal.  Left Ear: Hearing normal.  Nose: Nose normal.  Mouth/Throat: Oropharynx is clear and moist and mucous membranes are normal.  Eyes: Conjunctivae and EOM are normal. Pupils are equal, round, and reactive to light.  Neck: Normal range of motion. Neck supple.  Cardiovascular: Regular rhythm, S1 normal and S2 normal.  Tachycardia present.  Exam reveals no gallop and no friction rub.   No murmur heard. Pulmonary/Chest: Effort normal and breath sounds normal. No respiratory distress. She exhibits no tenderness.  Abdominal: Soft. Normal appearance and bowel sounds are normal. There is no hepatosplenomegaly. There is no tenderness. There is no rebound, no guarding, no tenderness at McBurney's point and negative Murphy's sign. No hernia.  Musculoskeletal: Normal range of motion.  Neurological: She is alert and oriented to person, place, and time. She has normal strength. No cranial nerve deficit or sensory deficit. Coordination normal. GCS eye subscore is 4. GCS verbal  subscore is 5. GCS motor subscore is 6.  Skin: Skin is warm, dry and intact. No rash noted. No cyanosis.  Psychiatric: She has a normal mood and affect. Her speech is normal and behavior is normal. Thought content normal.    ED Course  Procedures (including critical care time)  DIAGNOSTIC STUDIES: Oxygen Saturation is 95% on RA, adequate  by my interpretation.    COORDINATION OF CARE: 5:28 PM- Will order CBC, CMP, Lipase, I-stat, and urinalysis. Will give fluids. Discussed treatment plan with pt  at bedside and pt agreed to plan.     Labs Review Labs Reviewed  CBC - Abnormal; Notable for the following:    RBC 5.21 (*)    All other components within normal limits  COMPREHENSIVE METABOLIC PANEL - Abnormal; Notable for the following:    Sodium 136 (*)    Chloride 87 (*)    CO2 34 (*)    Glucose, Bld 196 (*)    BUN 39 (*)    Creatinine, Ser 1.62 (*)    Calcium 10.6 (*)    GFR calc non Af Amer 36 (*)    GFR calc Af Amer 42 (*)    All other components within normal limits  URINALYSIS, ROUTINE W REFLEX MICROSCOPIC - Abnormal; Notable for the following:    Color, Urine AMBER (*)    APPearance CLOUDY (*)    Bilirubin Urine SMALL (*)    Ketones, ur 15 (*)    Protein, ur 100 (*)    Leukocytes, UA TRACE (*)    All other components within normal limits  URINE MICROSCOPIC-ADD ON - Abnormal; Notable for the following:    Squamous Epithelial / LPF FEW (*)    Bacteria, UA MANY (*)    All other components within normal limits  I-STAT CG4 LACTIC ACID, ED - Abnormal; Notable for the following:    Lactic Acid, Venous 2.37 (*)    All other components within normal limits  LIPASE, BLOOD    Imaging Review Ct Abdomen Pelvis W Contrast  04/11/2014   CLINICAL DATA:  Abdominal pain with nausea and vomiting today. Left lower quadrant pain. Diabetes. Hemophilia. COPD. Parkinson's disease.  EXAM: CT ABDOMEN AND PELVIS WITH CONTRAST  TECHNIQUE: Multidetector CT imaging of the abdomen and pelvis was  performed using the standard protocol following bolus administration of intravenous contrast.  CONTRAST:  50mL OMNIPAQUE IOHEXOL 300 MG/ML SOLN, 80mL OMNIPAQUE IOHEXOL 300 MG/ML SOLN  COMPARISON:  02/1912  FINDINGS: Lower chest: Mild right base volume loss. Normal heart size without pericardial or pleural effusion.  Liver:  Hepatomegaly, 20.3 cm.  No focal liver lesion.  Spleen: Normal  Stomach: Mildly distended and contrast filled.  Pancreas: Normal, without mass or pancreatic ductal dilatation.  Gallbladder/Biliary Tree: Borderline gallbladder distention, without pericholecystic edema. No biliary ductal dilatation.  Kidneys/Adrenals: Normal adrenal glands. Upper pole left renal lesion which measures 15 HU and 2.3 cm. Likely a cyst or minimally complex cyst. Too small to characterize lesions in both kidneys.  Bowel loops: Decompressed colon and distal small bowel.  The duodenum, proximal to mid small bowel are moderately dilated and fluid/ contrast filled. Index jejunal loop measures 3.7 cm on image 46. A "Small bowel feces sign" is identified on image 71. The bowel just distal to this site is decompressed. Example image 62. Transition is identified deep to the more inferior of 2 fat containing umbilical hernias. No underlying mass. No findings to suggest complicating ischemia. No bowel wall thickening or significant mesenteric edema. Surgical clips in the mid abdomen.  Vascular: Age advanced aortic and branch vessel atherosclerosis.  Nodes: No retroperitoneal or retrocrural adenopathy. No pelvic adenopathy.  Pelvic Genitourinary: Hysterectomy. Decompressed urinary bladder. No adnexal mass.  Other: No significant free fluid. Two fat containing ventral abdominal wall/umbilical hernias.  Bones/Musculoskeletal: Advanced lumbar spondylosis.  IMPRESSION: 1. High-grade partial small bowel obstruction to the level of the mid small bowel, likely secondary to adhesions. No evidence of complicating ischemia. Given the extent  of gastric and proximal duodenal dilatation, the patient  would likely benefit from nasogastric tube placement. 2. Age advanced aortic atherosclerosis. 3. Borderline gallbladder distention, without specific evidence of acute cholecystitis. 4. Hepatomegaly.   Electronically Signed   By: Jeronimo Greaves M.D.   On: 04/11/2014 20:09     EKG Interpretation None      MDM   Final diagnoses:  None   small bowel obstruction  Possible UTI   She presented to the ER for evaluation of constant abdominal pain with nausea and vomiting. She has not had any diarrhea. She cannot tell me when the last bowel movement was. She reported that the pain was improved upon arrival, was told to take severe nausea. Patient's exam did not reveal any significant guarding or rebound, there was in fact no tenderness on palpation in the left lower right lower quadrant.  Lactate slightly elevated at 2.37. White count is normal at 5.2. Patient underwent CT scan for further evaluation of the abdominal pain to rule out obstruction, diverticulitis, et Karie Soda. CT scan does show high-grade obstruction felt to be secondary to adhesions. She has had a previous hysterectomy.  NG tube to be placed. General surgery consult, Doctor Lovell Sheehan. He did confirm treatment with the bowel rest with NG tube, we'll see in the morning.  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 She is unaware of this diagnosis. She reports that she has never had any problems with excessive bleeding or treatment for bleeding. I believe that this was entered in error.  Patient will be admitted to the hospitalist service.  I personally performed the services described in this documentation, which was scribed in my presence. The recorded information has been reviewed and is accurate.    Gilda Crease, MD 04/11/14 [phone]

## 2014-04-11 NOTE — ED Notes (Signed)
Pt had NG tube inserted in left nare, tolerated procedure well.

## 2014-04-11 NOTE — H&P (Signed)
[facility] History and Physical  [name] DOB: [birth year] DOA: 04/11/2014   PCP: Unknown Provider in [facility]: None  Chief Complaint: Nausea, vomiting, abdominal pain since yesterday  HPI: [name] is a 48 y.o. female with a past medical history of bipolar disorder, epilepsy, diabetes, COPD on home oxygen, who was in her usual state of health till yesterday when she started having nausea, vomiting, and abdominal pain. Patient is not a very good historian. She has vomited numerous times. Denies any blood in the emesis. Denies any fever or chills. Abdominal pain was located in the left lower part of the abdomen. Was sharp, 10 out of 10 in intensity. No radiation. No aggravating or relieving factors. No precipitating factors. Her last bowel movement was yesterday. She has not passed any gas in the last 24 hours. She's had the "stomach upset" for the last few days and took MiraLAX and Pepto-Bismol for the same. She has been burping a lot as well. She is feeling much better after the NG tube was placed.  Home Medications: Prior to Admission medications   Medication Sig Start Date End Date Taking? Authorizing Provider  ARIPiprazole (ABILIFY) 30 MG tablet Take 30 mg by mouth every morning.     Yes Historical Provider, MD  aspirin EC 81 MG tablet Take 81 mg by mouth every morning.     Yes Historical Provider, MD  calcium carbonate (OS-CAL) 600 MG TABS Take 600 mg by mouth 2 (two) times daily with a meal.     Yes Historical Provider, MD  clonazePAM (KLONOPIN) 1 MG tablet Take 1 mg by mouth 2 (two) times daily.    Yes Historical Provider, MD  divalproex (DEPAKOTE) 250 MG DR tablet Take 1,500 mg by mouth at bedtime.    Yes Historical Provider, MD  docusate sodium (COLACE) 100 MG capsule Take 100 mg by mouth 3 (three) times daily.     Yes Historical Provider, MD  gabapentin (NEURONTIN) 300 MG capsule Take 900 mg by mouth daily at 12 noon.    Yes Historical  Provider, MD  gemfibrozil (LOPID) 600 MG tablet Take 600 mg by mouth 2 (two) times daily before a meal.     Yes Historical Provider, MD  glipiZIDE (GLUCOTROL) 10 MG tablet Take 10 mg by mouth 2 (two) times daily before a meal.     Yes Historical Provider, MD  insulin glargine (LANTUS) 100 UNIT/ML injection Inject 70 Units into the skin every morning.     Yes Historical Provider, MD  isosorbide mononitrate (IMDUR) 60 MG 24 hr tablet Take 60 mg by mouth every morning.     Yes Historical Provider, MD  lisinopril (PRINIVIL,ZESTRIL) 10 MG tablet Take 10 mg by mouth every morning.     Yes Historical Provider, MD  metoprolol (TOPROL-XL) 50 MG 24 hr tablet Take 50 mg by mouth every morning.    Yes Historical Provider, MD  Multiple Vitamins-Minerals (MULTIVITAMINS THER. W/MINERALS) TABS Take 1 tablet by mouth every morning.     Yes Historical Provider, MD  oxybutynin (DITROPAN) 5 MG tablet Take 5 mg by mouth every morning.     Yes Historical Provider, MD  repaglinide (PRANDIN) 0.5 MG tablet Take 0.5 mg by mouth 3 (three) times daily before meals.   Yes Historical Provider, MD  traMADol (ULTRAM) 50 MG tablet Take 50 mg by mouth at bedtime. For pain   Yes Historical Provider, MD  ibuprofen (ADVIL,MOTRIN) 800 MG tablet Take 800 mg by mouth every  8 (eight) hours as needed. For pain      Historical Provider, MD    Allergies: No Known Allergies  Past Medical History: Past Medical History  Diagnosis Date  . Diabetes mellitus   . Seizures   . Hemophilia   . Hypertension   . Anxiety   . Panic attacks   . Fear of     falling  . COPD (chronic obstructive pulmonary disease)   . Parkinson's disease     Past Surgical History  Procedure Laterality Date  . Abdominal hysterectomy    . Oophorectomy      Social History: She lives in [place] with her husband. No smoking or alcohol use. She uses a walker to ambulate.  Family History: there is history of, diabetes in the family  Review of Systems - History  obtained from the patient General ROS: positive for  - fatigue Psychological ROS: positive for - anxiety Ophthalmic ROS: negative ENT ROS: negative Allergy and Immunology ROS: negative Hematological and Lymphatic ROS: negative Endocrine ROS: negative Respiratory ROS: no cough, shortness of breath, or wheezing Cardiovascular ROS: no chest pain or dyspnea on exertion Gastrointestinal ROS: as in hpi Genito-Urinary ROS: no dysuria, trouble voiding, or hematuria Musculoskeletal ROS: negative Neurological ROS: no TIA or stroke symptoms Dermatological ROS: negative  Physical Examination  Filed Vitals:   04/11/14 1709 04/11/14 1819 04/11/14 1830 04/11/14 2325  BP: 131/85  115/78 102/67  Pulse: 124 111 112 89  Temp: 99.9 F (37.7 C)     TempSrc: Oral     Resp: _0 Height:      Weight:      SpO2: 95% 94% 94%     BP 102/67  Pulse 89  Temp(Src) 99.9 F (37.7 C) (Oral)  Resp 18  Ht 5' 8" (1.727 m)  Wt 99.791 kg (220 lb)  BMI 33.46 kg/m2  SpO2 94%  General appearance: alert, cooperative, appears stated age, no distress and morbidly obese Head: Normocephalic, without obvious abnormality, atraumatic Eyes: conjunctivae/corneas clear. PERRL, EOM's intact.  Throat: dry mm Neck: no adenopathy, no carotid bruit, no JVD, supple, symmetrical, trachea midline and thyroid not enlarged, symmetric, no tenderness/mass/nodules Resp: clear to auscultation bilaterally Cardio: regular rate and rhythm, S1, S2 normal, no murmur, click, rub or gallop GI: soft, non-tender; bowel sounds normal; no masses,  no organomegaly Extremities: extremities normal, atraumatic, no cyanosis or edema Pulses: 2+ and symmetric Skin: Skin color, texture, turgor normal. No rashes or lesions Neurologic: Alert and oriented x3. No focal neurological deficits are noted.  Laboratory Data: Results for orders placed during the hospital encounter of 04/11/14 (from the past 48 hour(s))  CBC     Status: Abnormal    Collection Time    04/11/14  5:42 PM      Result Value Ref Range   WBC 5.2  4.0 - 10.5 K/uL   RBC 5.21 (*) 3.87 - 5.11 MIL/uL   Hemoglobin 14.8  12.0 - 15.0 g/dL   HCT 44.0  36.0 - 46.0 %   MCV 84.5  78.0 - 100.0 fL   MCH 28.4  26.0 - 34.0 pg   MCHC 33.6  30.0 - 36.0 g/dL   RDW 13.3  11.5 - 15.5 %   Platelets 230  150 - 400 K/uL  COMPREHENSIVE METABOLIC PANEL     Status: Abnormal   Collection Time    04/11/14  5:42 PM      Result Value Ref Range   Sodium 136 (*)  137 - 147 mEq/L   Potassium 4.1  3.7 - 5.3 mEq/L   Chloride 87 (*) 96 - 112 mEq/L   CO2 34 (*) 19 - 32 mEq/L   Glucose, Bld 196 (*) 70 - 99 mg/dL   BUN 39 (*) 6 - 23 mg/dL   Creatinine, Ser 1.62 (*) 0.50 - 1.10 mg/dL   Calcium 10.6 (*) 8.4 - 10.5 mg/dL   Total Protein 7.9  6.0 - 8.3 g/dL   Albumin 3.8  3.5 - 5.2 g/dL   AST 17  0 - 37 U/L   ALT 13  0 - 35 U/L   Alkaline Phosphatase 109  39 - 117 U/L   Total Bilirubin 0.4  0.3 - 1.2 mg/dL   GFR calc non Af Amer 36 (*) >90 mL/min   GFR calc Af Amer 42 (*) >90 mL/min   Comment: (NOTE)     The eGFR has been calculated using the CKD EPI equation.     This calculation has not been validated in all clinical situations.     eGFR's persistently <90 mL/min signify possible Chronic Kidney     Disease.   Anion gap 15  5 - 15  LIPASE, BLOOD     Status: None   Collection Time    04/11/14  5:42 PM      Result Value Ref Range   Lipase 32  11 - 59 U/L  I-STAT CG4 LACTIC ACID, ED     Status: Abnormal   Collection Time    04/11/14  5:52 PM      Result Value Ref Range   Lactic Acid, Venous 2.37 (*) 0.5 - 2.2 mmol/L  URINALYSIS, ROUTINE W REFLEX MICROSCOPIC     Status: Abnormal   Collection Time    04/11/14  7:27 PM      Result Value Ref Range   Color, Urine AMBER (*) YELLOW   Comment: BIOCHEMICALS MAY BE AFFECTED BY COLOR   APPearance CLOUDY (*) CLEAR   Specific Gravity, Urine 1.020  1.005 - 1.030   pH 6.0  5.0 - 8.0   Glucose, UA NEGATIVE  NEGATIVE mg/dL   Hgb urine  dipstick NEGATIVE  NEGATIVE   Bilirubin Urine SMALL (*) NEGATIVE   Ketones, ur 15 (*) NEGATIVE mg/dL   Protein, ur 100 (*) NEGATIVE mg/dL   Urobilinogen, UA 0.2  0.0 - 1.0 mg/dL   Nitrite NEGATIVE  NEGATIVE   Leukocytes, UA TRACE (*) NEGATIVE  URINE MICROSCOPIC-ADD ON     Status: Abnormal   Collection Time    04/11/14  7:27 PM      Result Value Ref Range   Squamous Epithelial / LPF FEW (*) RARE   WBC, UA 3-6  <3 WBC/hpf   Bacteria, UA MANY (*) RARE    Radiology Reports: Ct Abdomen Pelvis W Contrast  04/11/2014   CLINICAL DATA:  Abdominal pain with nausea and vomiting today. Left lower quadrant pain. Diabetes. Hemophilia. COPD. Parkinson's disease.  EXAM: CT ABDOMEN AND PELVIS WITH CONTRAST  TECHNIQUE: Multidetector CT imaging of the abdomen and pelvis was performed using the standard protocol following bolus administration of intravenous contrast.  CONTRAST:  35m OMNIPAQUE IOHEXOL 300 MG/ML SOLN, 875mOMNIPAQUE IOHEXOL 300 MG/ML SOLN  COMPARISON:  02/1912  FINDINGS: Lower chest: Mild right base volume loss. Normal heart size without pericardial or pleural effusion.  Liver:  Hepatomegaly, 20.3 cm.  No focal liver lesion.  Spleen: Normal  Stomach: Mildly distended and contrast filled.  Pancreas: Normal, without mass  or pancreatic ductal dilatation.  Gallbladder/Biliary Tree: Borderline gallbladder distention, without pericholecystic edema. No biliary ductal dilatation.  Kidneys/Adrenals: Normal adrenal glands. Upper pole left renal lesion which measures 15 HU and 2.3 cm. Likely a cyst or minimally complex cyst. Too small to characterize lesions in both kidneys.  Bowel loops: Decompressed colon and distal small bowel.  The duodenum, proximal to mid small bowel are moderately dilated and fluid/ contrast filled. Index jejunal loop measures 3.7 cm on image 46. A "Small bowel feces sign" is identified on image 71. The bowel just distal to this site is decompressed. Example image 62. Transition is  identified deep to the more inferior of 2 fat containing umbilical hernias. No underlying mass. No findings to suggest complicating ischemia. No bowel wall thickening or significant mesenteric edema. Surgical clips in the mid abdomen.  Vascular: Age advanced aortic and branch vessel atherosclerosis.  Nodes: No retroperitoneal or retrocrural adenopathy. No pelvic adenopathy.  Pelvic Genitourinary: Hysterectomy. Decompressed urinary bladder. No adnexal mass.  Other: No significant free fluid. Two fat containing ventral abdominal wall/umbilical hernias.  Bones/Musculoskeletal: Advanced lumbar spondylosis.  IMPRESSION: 1. High-grade partial small bowel obstruction to the level of the mid small bowel, likely secondary to adhesions. No evidence of complicating ischemia. Given the extent of gastric and proximal duodenal dilatation, the patient would likely benefit from nasogastric tube placement. 2. Age advanced aortic atherosclerosis. 3. Borderline gallbladder distention, without specific evidence of acute cholecystitis. 4. Hepatomegaly.   Electronically Signed   By: Abigail Miyamoto M.D.   On: 04/11/2014 20:09     Problem List  Principal Problem:   SBO (small bowel obstruction) Active Problems:   Bipolar disorder   DM type 2 (diabetes mellitus, type 2)   Seizure disorder   ARF (acute renal failure)   Assessment: This is a 48 year old, Caucasian female, with a past medical history as stated earlier, presents with nausea, vomiting, and abdominal pain. CT scan of the abdomen and pelvis showed evidence for small bowel obstruction, which is the reason for her presentation.  Plan: #1 high-grade small bowel obstruction: General surgery had been consulted and they will evaluate her in the morning. NG tube has been placed and will be continued. She'll be kept n.p.o. for now. Lactic acid was mildly elevated. CT did not show any evidence for ischemia. Continue to monitor closely.  #2 history of diabetes mellitus,  type II, on insulin: Continue with her insulin at a lower dose. Hold her oral agents. Place her on sliding scale coverage. Check HbA1c.  #3 history of seizure disorder: Place her on Depakote intravenously. Check a Depakote level.  #4 bipolar disorder: Resume her oral medication as soon as able to.  #5 mild acute renal failure: Possibly due to hypovolemia. We will give her IV fluids. Repeat renal function in the morning.  #6 abnormal UA: Send urine for culture. Place her on ceftriaxone for now.   DVT Prophylaxis: Heparin Code Status: Full code Family Communication: Discussed with the patient and her family  Disposition Plan: Admit to MedSurg   Further management decisions will depend on results of further testing and patient's response to treatment.   [facility]  [facility] Pager [phone]  If 7PM-7AM, please contact night-coverage [email or web address] Password [email or web address]  04/12/2014, 12:06 AM  Disclaimer: This note was dictated with voice recognition software. Similar sounding words can inadvertently be transcribed and may not be corrected upon review.

## 2014-04-11 NOTE — ED Notes (Signed)
Complain of nausea and vomiting since 0300 this morning

## 2014-04-11 NOTE — ED Notes (Signed)
Pt tolerated first cup of contrast, drinking second cup at present time, family at bedside,

## 2014-04-11 NOTE — ED Notes (Signed)
Pt c/o abd pain with n/v that started this am suddenly, last bowel movement was yesterday per pt, pt alert, able to answer questions, skin clammy, back of head wet. Dr Blinda LeatherwoodPollina at bedside, see edp assessment for further,

## 2014-04-12 ENCOUNTER — Inpatient Hospital Stay (HOSPITAL_COMMUNITY): Payer: Medicaid Other

## 2014-04-12 ENCOUNTER — Encounter (HOSPITAL_COMMUNITY): Payer: Self-pay

## 2014-04-12 DIAGNOSIS — N179 Acute kidney failure, unspecified: Secondary | ICD-10-CM | POA: Diagnosis present

## 2014-04-12 LAB — COMPREHENSIVE METABOLIC PANEL
ALBUMIN: 3.7 g/dL (ref 3.5–5.2)
ALT: 17 U/L (ref 0–35)
ANION GAP: 17 — AB (ref 5–15)
AST: 43 U/L — AB (ref 0–37)
Alkaline Phosphatase: 102 U/L (ref 39–117)
BILIRUBIN TOTAL: 0.3 mg/dL (ref 0.3–1.2)
BUN: 50 mg/dL — AB (ref 6–23)
CHLORIDE: 82 meq/L — AB (ref 96–112)
CO2: 40 mEq/L (ref 19–32)
Calcium: 10.2 mg/dL (ref 8.4–10.5)
Creatinine, Ser: 2.06 mg/dL — ABNORMAL HIGH (ref 0.50–1.10)
GFR calc Af Amer: 31 mL/min — ABNORMAL LOW (ref >90)
GFR calc non Af Amer: 27 mL/min — ABNORMAL LOW (ref >90)
Glucose, Bld: 171 mg/dL — ABNORMAL HIGH (ref 70–99)
Potassium: 3.6 mEq/L — ABNORMAL LOW (ref 3.7–5.3)
Sodium: 139 mEq/L (ref 137–147)
TOTAL PROTEIN: 7.6 g/dL (ref 6.0–8.3)

## 2014-04-12 LAB — GLUCOSE, CAPILLARY
GLUCOSE-CAPILLARY: 163 mg/dL — AB (ref 70–99)
GLUCOSE-CAPILLARY: 180 mg/dL — AB (ref 70–99)
GLUCOSE-CAPILLARY: 186 mg/dL — AB (ref 70–99)
GLUCOSE-CAPILLARY: 185 mg/dL — AB (ref 70–99)
Glucose-Capillary: 162 mg/dL — ABNORMAL HIGH (ref 70–99)

## 2014-04-12 LAB — CBC
HEMATOCRIT: 45.1 % (ref 36.0–46.0)
HEMOGLOBIN: 14.7 g/dL (ref 12.0–15.0)
MCH: 27.8 pg (ref 26.0–34.0)
MCHC: 32.6 g/dL (ref 30.0–36.0)
MCV: 85.4 fL (ref 78.0–100.0)
Platelets: 220 10*3/uL (ref 150–400)
RBC: 5.28 MIL/uL — ABNORMAL HIGH (ref 3.87–5.11)
RDW: 13.5 % (ref 11.5–15.5)
WBC: 4.4 10*3/uL (ref 4.0–10.5)

## 2014-04-12 LAB — VALPROIC ACID LEVEL: VALPROIC ACID LVL: 24.2 ug/mL — AB (ref 50.0–100.0)

## 2014-04-12 LAB — HEMOGLOBIN A1C
HEMOGLOBIN A1C: 7.3 % — AB (ref <5.7)
MEAN PLASMA GLUCOSE: 163 mg/dL — AB (ref <117)

## 2014-04-12 MED ORDER — SODIUM CHLORIDE 0.9 % IV SOLN
INTRAVENOUS | Status: DC
  Administered 2014-04-12 – 2014-04-14 (×6): via INTRAVENOUS

## 2014-04-12 MED ORDER — VALPROATE SODIUM 500 MG/5ML IV SOLN
500.0000 mg | Freq: Two times a day (BID) | INTRAVENOUS | Status: DC
  Administered 2014-04-12 – 2014-04-14 (×6): 500 mg via INTRAVENOUS
  Filled 2014-04-12 (×8): qty 5

## 2014-04-12 MED ORDER — DEXTROSE 5 % IV SOLN
1.0000 g | INTRAVENOUS | Status: DC
  Administered 2014-04-12 – 2014-04-14 (×3): 1 g via INTRAVENOUS
  Filled 2014-04-12 (×4): qty 10

## 2014-04-12 MED ORDER — DEXTROSE 5 % IV SOLN
INTRAVENOUS | Status: AC
  Filled 2014-04-12: qty 10

## 2014-04-12 MED ORDER — ONDANSETRON HCL 4 MG PO TABS: 4.0000 mg | ORAL_TABLET | Freq: Four times a day (QID) | ORAL | Status: DC | PRN

## 2014-04-12 MED ORDER — SODIUM CHLORIDE 0.9 % IV SOLN: INTRAVENOUS | Status: DC

## 2014-04-12 MED ORDER — LORAZEPAM 2 MG/ML IJ SOLN
0.5000 mg | Freq: Four times a day (QID) | INTRAMUSCULAR | Status: DC | PRN
  Administered 2014-04-12: 0.5 mg via INTRAVENOUS
  Filled 2014-04-12: qty 1

## 2014-04-12 MED ORDER — BIOTENE DRY MOUTH MT LIQD
15.0000 mL | Freq: Two times a day (BID) | OROMUCOSAL | Status: DC
  Administered 2014-04-12 – 2014-04-14 (×5): 15 mL via OROMUCOSAL

## 2014-04-12 MED ORDER — INSULIN ASPART 100 UNIT/ML ~~LOC~~ SOLN
0.0000 [IU] | SUBCUTANEOUS | Status: DC
Start: 2014-04-12 — End: 2014-04-17
  Administered 2014-04-12 (×6): 3 [IU] via SUBCUTANEOUS
  Administered 2014-04-13 (×2): 5 [IU] via SUBCUTANEOUS
  Administered 2014-04-13: 3 [IU] via SUBCUTANEOUS
  Administered 2014-04-13 (×2): 5 [IU] via SUBCUTANEOUS
  Administered 2014-04-13: 3 [IU] via SUBCUTANEOUS
  Administered 2014-04-14 (×3): 5 [IU] via SUBCUTANEOUS
  Administered 2014-04-15 (×2): 3 [IU] via SUBCUTANEOUS
  Administered 2014-04-15: 2 [IU] via SUBCUTANEOUS
  Administered 2014-04-15 (×2): 3 [IU] via SUBCUTANEOUS
  Administered 2014-04-16: 8 [IU] via SUBCUTANEOUS
  Administered 2014-04-16: 5 [IU] via SUBCUTANEOUS
  Administered 2014-04-16: 8 [IU] via SUBCUTANEOUS
  Administered 2014-04-16 (×2): 3 [IU] via SUBCUTANEOUS
  Administered 2014-04-16: 5 [IU] via SUBCUTANEOUS
  Administered 2014-04-17: 3 [IU] via SUBCUTANEOUS
  Administered 2014-04-17 (×2): 5 [IU] via SUBCUTANEOUS
  Administered 2014-04-17: 2 [IU] via SUBCUTANEOUS

## 2014-04-12 MED ORDER — ACETAMINOPHEN 325 MG PO TABS
650.0000 mg | ORAL_TABLET | Freq: Four times a day (QID) | ORAL | Status: DC | PRN
  Administered 2014-04-15 – 2014-04-16 (×2): 650 mg via ORAL
  Filled 2014-04-12 (×4): qty 2

## 2014-04-12 MED ORDER — HEPARIN SODIUM (PORCINE) 5000 UNIT/ML IJ SOLN
5000.0000 [IU] | Freq: Three times a day (TID) | INTRAMUSCULAR | Status: DC
  Administered 2014-04-12 – 2014-04-14 (×9): 5000 [IU] via SUBCUTANEOUS
  Filled 2014-04-12 (×9): qty 1

## 2014-04-12 MED ORDER — ACETAMINOPHEN 650 MG RE SUPP
650.0000 mg | Freq: Four times a day (QID) | RECTAL | Status: DC | PRN
  Administered 2014-04-12: 650 mg via RECTAL
  Filled 2014-04-12 (×2): qty 1

## 2014-04-12 MED ORDER — ALBUTEROL SULFATE (2.5 MG/3ML) 0.083% IN NEBU: 2.5000 mg | INHALATION_SOLUTION | RESPIRATORY_TRACT | Status: DC | PRN

## 2014-04-12 MED ORDER — INSULIN GLARGINE 100 UNIT/ML ~~LOC~~ SOLN
30.0000 [IU] | Freq: Every day | SUBCUTANEOUS | Status: DC
  Administered 2014-04-12 – 2014-04-13 (×2): 30 [IU] via SUBCUTANEOUS
  Filled 2014-04-12 (×3): qty 0.3

## 2014-04-12 MED ORDER — ONDANSETRON HCL 4 MG/2ML IJ SOLN
4.0000 mg | Freq: Four times a day (QID) | INTRAMUSCULAR | Status: DC | PRN
Start: 2014-04-12 — End: 2014-04-20
  Administered 2014-04-12 – 2014-04-19 (×4): 4 mg via INTRAVENOUS
  Filled 2014-04-12 (×4): qty 2

## 2014-04-12 MED ORDER — VALPROATE SODIUM 500 MG/5ML IV SOLN
INTRAVENOUS | Status: AC
  Filled 2014-04-12: qty 5

## 2014-04-12 MED ORDER — MORPHINE SULFATE 2 MG/ML IJ SOLN
1.0000 mg | INTRAMUSCULAR | Status: DC | PRN
  Administered 2014-04-12: 1 mg via INTRAVENOUS
  Filled 2014-04-12 (×2): qty 1

## 2014-04-12 MED ORDER — INSULIN GLARGINE 100 UNIT/ML ~~LOC~~ SOLN
30.0000 [IU] | SUBCUTANEOUS | Status: DC
  Filled 2014-04-12 (×2): qty 0.3

## 2014-04-12 MED ORDER — LORAZEPAM 2 MG/ML IJ SOLN
1.0000 mg | INTRAMUSCULAR | Status: DC | PRN
  Administered 2014-04-12 – 2014-04-20 (×13): 1 mg via INTRAVENOUS
  Filled 2014-04-12 (×13): qty 1

## 2014-04-12 NOTE — Progress Notes (Signed)
[facility] PROGRESS NOTE  Brytnee F TiznadStarling Mannso NFA:[id number]:[id number] DOB: [birth year] DOA: 04/11/2014 PCP: No primary provider on file.  Assessment/Plan: 1. High-grade small bowel obstruction- continue with NG tube suction, general surgery has been consulted. Will give patient some ice chips. We'll repeat abdominal x-ray 2. Anxiety- start Ativan 0.5 mg IV every 6 hours when necessary for anxiety 3. Acute kidney injury- patient's creatinine today is elevated to 2.06, will increase the IV fluids to 125 mL per hour. Follow BMP in a.m. 4. ? UTI- patient had abnormal UA and was started on Rocephin, urine culture is pending. Patient had MAXIMUM TEMPERATURE of 102.last night 5. History of seizure disorder- continue Depakote, Depakote level 24.2, which is subtherapeutic. Continue IV Depakote 500 mg every 12 hours. We'll start the by mouth Depakote once patient able to take by mouth. 6. History of bipolar disorder- medication on hold as patient is n.p.o., will restart medications once patient able to take  by mouth. 7. Diabetes mellitus- continue sliding scale insulin. 8. DVT prophylaxis- heparin  Code Status: Full code Family Communication: *No family at bedside Disposition Plan: Home when stable   Consultants:  *General surgery  Procedures:  None  Antibiotics:  None  HPI/Subjective: 48 year old female with history of bipolar disorder, epilepsy, diabetes mellitus, COPD on home oxygen a mid abdominal pain nausea and vomiting and found to have small bowel obstruction as per the CT scan abdomen. NG tube placed. Patient very anxious this morning, wants to try some ice chips.  Objective: Filed Vitals:   04/12/14 0705  BP: 93/64  Pulse: 109  Temp: 100 F (37.8 C)  Resp: 18    Intake/Output Summary (Last 24 hours) at 04/12/14 1030 Last data filed at 04/12/14 0900  Gross per 24 hour  Intake 1478.75 ml  Output   2200 ml  Net -721.25 ml   Filed Weights   04/11/14 1707 04/12/14 0044   Weight: 99.791 kg (220 lb) 99.791 kg (220 lb)    Exam:  Physical Exam: Head: Normocephalic, atraumatic.  Lungs: Normal respiratory effort. B/L Clear to auscultation, no crackles or wheezes.  Heart: Regular RR. S1 and S2 normal  Abdomen: BS normoactive. Soft, Nondistended, non-tender.  Extremities: No pretibial edema, no erythema   Data Reviewed: Basic Metabolic Panel:  Recent Labs Lab 04/11/14 1742 04/12/14 0646  NA 136* 139  K 4.1 3.6*  CL 87* 82*  CO2 34* 40*  GLUCOSE 196* 171*  BUN 39* 50*  CREATININE 1.62* 2.06*  CALCIUM 10.6* 10.2   Liver Function Tests:  Recent Labs Lab 04/11/14 1742 04/12/14 0646  AST 17 43*  ALT 13 17  ALKPHOS 109 102  BILITOT 0.4 0.3  PROT 7.9 7.6  ALBUMIN 3.8 3.7    Recent Labs Lab 04/11/14 1742  LIPASE 32   No results found for this basename: AMMONIA,  in the last 168 hours CBC:  Recent Labs Lab 04/11/14 1742 04/12/14 0646  WBC 5.2 4.4  HGB 14.8 14.7  HCT 44.0 45.1  MCV 84.5 85.4  PLT 230 220   Cardiac Enzymes: No results found for this basename: CKTOTAL, CKMB, CKMBINDEX, TROPONINI,  in the last 168 hours BNP (last 3 results) No results found for this basename: PROBNP,  in the last 8760 hours CBG:  Recent Labs Lab 04/12/14 0139 04/12/14 0528 04/12/14 0801  GLUCAP 162* 163* 180*    No results found for this or any previous visit (from the past 240 hour(s)).   Studies: Ct Abdomen Pelvis W Contrast  04/11/2014  CLINICAL DATA:  Abdominal pain with nausea and vomiting today. Left lower quadrant pain. Diabetes. Hemophilia. COPD. Parkinson's disease.  EXAM: CT ABDOMEN AND PELVIS WITH CONTRAST  TECHNIQUE: Multidetector CT imaging of the abdomen and pelvis was performed using the standard protocol following bolus administration of intravenous contrast.  CONTRAST:  50mL OMNIPAQUE IOHEXOL 300 MG/ML SOLN, 80mL OMNIPAQUE IOHEXOL 300 MG/ML SOLN  COMPARISON:  02/1912  FINDINGS: Lower chest: Mild right base volume loss.  Normal heart size without pericardial or pleural effusion.  Liver:  Hepatomegaly, 20.3 cm.  No focal liver lesion.  Spleen: Normal  Stomach: Mildly distended and contrast filled.  Pancreas: Normal, without mass or pancreatic ductal dilatation.  Gallbladder/Biliary Tree: Borderline gallbladder distention, without pericholecystic edema. No biliary ductal dilatation.  Kidneys/Adrenals: Normal adrenal glands. Upper pole left renal lesion which measures 15 HU and 2.3 cm. Likely a cyst or minimally complex cyst. Too small to characterize lesions in both kidneys.  Bowel loops: Decompressed colon and distal small bowel.  The duodenum, proximal to mid small bowel are moderately dilated and fluid/ contrast filled. Index jejunal loop measures 3.7 cm on image 46. A "Small bowel feces sign" is identified on image 71. The bowel just distal to this site is decompressed. Example image 62. Transition is identified deep to the more inferior of 2 fat containing umbilical hernias. No underlying mass. No findings to suggest complicating ischemia. No bowel wall thickening or significant mesenteric edema. Surgical clips in the mid abdomen.  Vascular: Age advanced aortic and branch vessel atherosclerosis.  Nodes: No retroperitoneal or retrocrural adenopathy. No pelvic adenopathy.  Pelvic Genitourinary: Hysterectomy. Decompressed urinary bladder. No adnexal mass.  Other: No significant free fluid. Two fat containing ventral abdominal wall/umbilical hernias.  Bones/Musculoskeletal: Advanced lumbar spondylosis.  IMPRESSION: 1. High-grade partial small bowel obstruction to the level of the mid small bowel, likely secondary to adhesions. No evidence of complicating ischemia. Given the extent of gastric and proximal duodenal dilatation, the patient would likely benefit from nasogastric tube placement. 2. Age advanced aortic atherosclerosis. 3. Borderline gallbladder distention, without specific evidence of acute cholecystitis. 4. Hepatomegaly.    Electronically Signed   By: Jeronimo Greaves M.D.   On: 04/11/2014 20:09    Scheduled Meds: . antiseptic oral rinse  15 mL Mouth Rinse BID  . cefTRIAXone (ROCEPHIN)  IV  1 g Intravenous Q24H  . heparin  5,000 Units Subcutaneous Q8H  . insulin aspart  0-15 Units Subcutaneous 6 times per day  . insulin glargine  30 Units Subcutaneous Daily  . valproate sodium  500 mg Intravenous Q12H   Continuous Infusions: . sodium chloride 125 mL/hr at 04/12/14 4098    Principal Problem:   SBO (small bowel obstruction) Active Problems:   Bipolar disorder   DM type 2 (diabetes mellitus, type 2)   Seizure disorder   ARF (acute renal failure)    Time spent: 25 minutes    [facility]  [facility] Pager [phone]*. If 7PM-7AM, please contact night-coverage at [email or web address], password [facility] 04/12/2014, 10:30 AM  LOS: 1 day

## 2014-04-12 NOTE — Consult Note (Signed)
Reason for Consult: Small bowel obstruction Referring Physician: Hospitalist  [name] is an 48 y.o. female.  HPI: Patient is a 48 year old white female with multiple psychiatric issues who presents with abdominal distention, nausea, vomiting. She does not remember when her last bowel movement was, though she states that she takes stool softener regularly and missed her dose yesterday. CT scan the abdomen reveals a small bowel obstruction most likely secondary to adhesive disease. It is difficult to get a history from her do to her mental illness.  Past Medical History  Diagnosis Date  . Diabetes mellitus   . Seizures   . Hemophilia   . Hypertension   . Anxiety   . Panic attacks   . Fear of     falling  . COPD (chronic obstructive pulmonary disease)   . Parkinson's disease     Past Surgical History  Procedure Laterality Date  . Abdominal hysterectomy    . Oophorectomy      History reviewed. No pertinent family history.  Social History:  reports that she has never smoked. She does not have any smokeless tobacco history on file. She reports that she does not drink alcohol or use illicit drugs.  Allergies: No Known Allergies  Medications: I have reviewed the patient's current medications.  Results for orders placed during the hospital encounter of 04/11/14 (from the past 48 hour(s))  CBC     Status: Abnormal   Collection Time    04/11/14  5:42 PM      Result Value Ref Range   WBC 5.2  4.0 - 10.5 K/uL   RBC 5.21 (*) 3.87 - 5.11 MIL/uL   Hemoglobin 14.8  12.0 - 15.0 g/dL   HCT 44.0  36.0 - 46.0 %   MCV 84.5  78.0 - 100.0 fL   MCH 28.4  26.0 - 34.0 pg   MCHC 33.6  30.0 - 36.0 g/dL   RDW 13.3  11.5 - 15.5 %   Platelets 230  150 - 400 K/uL  COMPREHENSIVE METABOLIC PANEL     Status: Abnormal   Collection Time    04/11/14  5:42 PM      Result Value Ref Range   Sodium 136 (*) 137 - 147 mEq/L   Potassium 4.1  3.7 - 5.3 mEq/L   Chloride 87 (*) 96 - 112 mEq/L   CO2 34  (*) 19 - 32 mEq/L   Glucose, Bld 196 (*) 70 - 99 mg/dL   BUN 39 (*) 6 - 23 mg/dL   Creatinine, Ser 1.62 (*) 0.50 - 1.10 mg/dL   Calcium 10.6 (*) 8.4 - 10.5 mg/dL   Total Protein 7.9  6.0 - 8.3 g/dL   Albumin 3.8  3.5 - 5.2 g/dL   AST 17  0 - 37 U/L   ALT 13  0 - 35 U/L   Alkaline Phosphatase 109  39 - 117 U/L   Total Bilirubin 0.4  0.3 - 1.2 mg/dL   GFR calc non Af Amer 36 (*) >90 mL/min   GFR calc Af Amer 42 (*) >90 mL/min   Comment: (NOTE)     The eGFR has been calculated using the CKD EPI equation.     This calculation has not been validated in all clinical situations.     eGFR's persistently <90 mL/min signify possible Chronic Kidney     Disease.   Anion gap 15  5 - 15  LIPASE, BLOOD     Status: None   Collection Time  04/11/14  5:42 PM      Result Value Ref Range   Lipase 32  11 - 59 U/L  I-STAT CG4 LACTIC ACID, ED     Status: Abnormal   Collection Time    04/11/14  5:52 PM      Result Value Ref Range   Lactic Acid, Venous 2.37 (*) 0.5 - 2.2 mmol/L  URINALYSIS, ROUTINE W REFLEX MICROSCOPIC     Status: Abnormal   Collection Time    04/11/14  7:27 PM      Result Value Ref Range   Color, Urine AMBER (*) YELLOW   Comment: BIOCHEMICALS MAY BE AFFECTED BY COLOR   APPearance CLOUDY (*) CLEAR   Specific Gravity, Urine 1.020  1.005 - 1.030   pH 6.0  5.0 - 8.0   Glucose, UA NEGATIVE  NEGATIVE mg/dL   Hgb urine dipstick NEGATIVE  NEGATIVE   Bilirubin Urine SMALL (*) NEGATIVE   Ketones, ur 15 (*) NEGATIVE mg/dL   Protein, ur 100 (*) NEGATIVE mg/dL   Urobilinogen, UA 0.2  0.0 - 1.0 mg/dL   Nitrite NEGATIVE  NEGATIVE   Leukocytes, UA TRACE (*) NEGATIVE  URINE MICROSCOPIC-ADD ON     Status: Abnormal   Collection Time    04/11/14  7:27 PM      Result Value Ref Range   Squamous Epithelial / LPF FEW (*) RARE   WBC, UA 3-6  <3 WBC/hpf   Bacteria, UA MANY (*) RARE  VALPROIC ACID LEVEL     Status: Abnormal   Collection Time    04/12/14  1:37 AM      Result Value Ref Range    Valproic Acid Lvl 24.2 (*) 50.0 - 100.0 ug/mL  GLUCOSE, CAPILLARY     Status: Abnormal   Collection Time    04/12/14  1:39 AM      Result Value Ref Range   Glucose-Capillary 162 (*) 70 - 99 mg/dL   Comment 1 Notify RN    GLUCOSE, CAPILLARY     Status: Abnormal   Collection Time    04/12/14  5:28 AM      Result Value Ref Range   Glucose-Capillary 163 (*) 70 - 99 mg/dL   Comment 1 Notify RN    COMPREHENSIVE METABOLIC PANEL     Status: Abnormal   Collection Time    04/12/14  6:46 AM      Result Value Ref Range   Sodium 139  137 - 147 mEq/L   Potassium 3.6 (*) 3.7 - 5.3 mEq/L   Chloride 82 (*) 96 - 112 mEq/L   CO2 40 (*) 19 - 32 mEq/L   Comment: CRITICAL RESULT CALLED TO, READ BACK BY AND VERIFIED WITH:     ROGERS,A AT 0757 BY GODFREY,O ON 04/12/14   Glucose, Bld 171 (*) 70 - 99 mg/dL   BUN 50 (*) 6 - 23 mg/dL   Creatinine, Ser 2.06 (*) 0.50 - 1.10 mg/dL   Calcium 10.2  8.4 - 10.5 mg/dL   Total Protein 7.6  6.0 - 8.3 g/dL   Albumin 3.7  3.5 - 5.2 g/dL   AST 43 (*) 0 - 37 U/L   ALT 17  0 - 35 U/L   Alkaline Phosphatase 102  39 - 117 U/L   Total Bilirubin 0.3  0.3 - 1.2 mg/dL   GFR calc non Af Amer 27 (*) >90 mL/min   GFR calc Af Amer 31 (*) >90 mL/min   Comment: (NOTE)  The eGFR has been calculated using the CKD EPI equation.     This calculation has not been validated in all clinical situations.     eGFR's persistently <90 mL/min signify possible Chronic Kidney     Disease.   Anion gap 17 (*) 5 - 15  CBC     Status: Abnormal   Collection Time    04/12/14  6:46 AM      Result Value Ref Range   WBC 4.4  4.0 - 10.5 K/uL   RBC 5.28 (*) 3.87 - 5.11 MIL/uL   Hemoglobin 14.7  12.0 - 15.0 g/dL   HCT 45.1  36.0 - 46.0 %   MCV 85.4  78.0 - 100.0 fL   MCH 27.8  26.0 - 34.0 pg   MCHC 32.6  30.0 - 36.0 g/dL   RDW 13.5  11.5 - 15.5 %   Platelets 220  150 - 400 K/uL  GLUCOSE, CAPILLARY     Status: Abnormal   Collection Time    04/12/14  8:01 AM      Result Value Ref Range    Glucose-Capillary 180 (*) 70 - 99 mg/dL   Comment 1 Notify RN    GLUCOSE, CAPILLARY     Status: Abnormal   Collection Time    04/12/14 11:28 AM      Result Value Ref Range   Glucose-Capillary 186 (*) 70 - 99 mg/dL   Comment 1 Notify RN      Ct Abdomen Pelvis W Contrast  04/11/2014   CLINICAL DATA:  Abdominal pain with nausea and vomiting today. Left lower quadrant pain. Diabetes. Hemophilia. COPD. Parkinson's disease.  EXAM: CT ABDOMEN AND PELVIS WITH CONTRAST  TECHNIQUE: Multidetector CT imaging of the abdomen and pelvis was performed using the standard protocol following bolus administration of intravenous contrast.  CONTRAST:  18m OMNIPAQUE IOHEXOL 300 MG/ML SOLN, 825mOMNIPAQUE IOHEXOL 300 MG/ML SOLN  COMPARISON:  02/1912  FINDINGS: Lower chest: Mild right base volume loss. Normal heart size without pericardial or pleural effusion.  Liver:  Hepatomegaly, 20.3 cm.  No focal liver lesion.  Spleen: Normal  Stomach: Mildly distended and contrast filled.  Pancreas: Normal, without mass or pancreatic ductal dilatation.  Gallbladder/Biliary Tree: Borderline gallbladder distention, without pericholecystic edema. No biliary ductal dilatation.  Kidneys/Adrenals: Normal adrenal glands. Upper pole left renal lesion which measures 15 HU and 2.3 cm. Likely a cyst or minimally complex cyst. Too small to characterize lesions in both kidneys.  Bowel loops: Decompressed colon and distal small bowel.  The duodenum, proximal to mid small bowel are moderately dilated and fluid/ contrast filled. Index jejunal loop measures 3.7 cm on image 46. A "Small bowel feces sign" is identified on image 71. The bowel just distal to this site is decompressed. Example image 62. Transition is identified deep to the more inferior of 2 fat containing umbilical hernias. No underlying mass. No findings to suggest complicating ischemia. No bowel wall thickening or significant mesenteric edema. Surgical clips in the mid abdomen.  Vascular:  Age advanced aortic and branch vessel atherosclerosis.  Nodes: No retroperitoneal or retrocrural adenopathy. No pelvic adenopathy.  Pelvic Genitourinary: Hysterectomy. Decompressed urinary bladder. No adnexal mass.  Other: No significant free fluid. Two fat containing ventral abdominal wall/umbilical hernias.  Bones/Musculoskeletal: Advanced lumbar spondylosis.  IMPRESSION: 1. High-grade partial small bowel obstruction to the level of the mid small bowel, likely secondary to adhesions. No evidence of complicating ischemia. Given the extent of gastric and proximal duodenal dilatation, the patient would  likely benefit from nasogastric tube placement. 2. Age advanced aortic atherosclerosis. 3. Borderline gallbladder distention, without specific evidence of acute cholecystitis. 4. Hepatomegaly.   Electronically Signed   By: Abigail Miyamoto M.D.   On: 04/11/2014 20:09   Dg Abd 2 Views  04/12/2014   CLINICAL DATA:  Small bowel obstruction.  EXAM: ABDOMEN - 2 VIEW  COMPARISON:  CT topogram on 04/11/2014  FINDINGS: A new nasogastric tube is seen with tip overlying the pylorus or proximal duodenum. Mild decrease in dilated small bowel loops seen since prior study. Multiple surgical clips again seen within the right abdomen and pelvis. No free air identified.  IMPRESSION: Mild decrease in small bowel dilatation. Nasogastric tube tip overlies the pylorus or proximal duodenum.   Electronically Signed   By: Earle Gell M.D.   On: 04/12/2014 12:01    ROS: See chart Blood pressure 93/64, pulse 109, temperature 100 F (37.8 C), temperature source Oral, resp. rate 18, height _0  (1.727 m), weight 99.791 kg (220 lb), SpO2 95.00%. Physical Exam: Morbidly obese white female who is anxious Abdomen is soft with no specific tenderness noted. Her body habitus makes her examination limited. No rigidity is noted.  Assessment/Plan: Impression: Small bowel obstruction Plan: Agree with NG tube decompression. We'll follow with you.  No need for acute surgical intervention at this time.  [name] A 04/12/2014, 12:50 PM

## 2014-04-12 NOTE — Progress Notes (Signed)
04/12/14 1931 Late entry: Patient requested "I need my stool softener and I need it now. I need Dr Lovell SheehanJenkins to call my son about what's going on". Discussed with patient that stool softener cannot be given with NG tube in place and nothing by mouth status for bowel rest. Discussed with patient that stool softener not necessary at this time due to bowel obstruction as discussed with her by physicians this morning. Notified Dr Lovell SheehanJenkins of patient request that he provide her son, Thayer OhmChris with update. Dr Lovell SheehanJenkins spoke with son, Thayer OhmChris and patient via phone this evening. Nursing notified Dr. Lovell SheehanJenkins of NG output of 1900 ml for day shift. Stated okay. Earnstine RegalAshley Mallika Sanmiguel, RN

## 2014-04-12 NOTE — Progress Notes (Signed)
04/12/14 0720 Patient stated "I know why I have this obstruction, I need my stool softener. I take it several times a day and haven't had it in days." pt then stated "I have it but didn't take it since Friday". Pt requested "something to eat, I haven't eaten in a day". Reported last stool as Friday per night shift RN, pt now states "I don't know, I can't remember that far back, maybe Monday".  Discussed nothing by mouth as ordered for bowel rest, NG tube present. NG output 700 ml green, bile present this morning. Has not been included in night shift output per night shift RN. Educated patient on rationale for NG tube and NPO status. Pt requires frequent reinforcement of education per report. Earnstine RegalAshley Ashwath Lasch, RN

## 2014-04-12 NOTE — Plan of Care (Signed)
Problem: Phase I Progression Outcomes Goal: OOB as tolerated unless otherwise ordered Outcome: Progressing 04/12/14 1258 Patient assisted up to chair this morning prior to lunch. Assisted up to chair with lift and three nursing staff assist. Pt stated "unable to stand up due to weakness". Chair alarm on for safety, pt stated comfortable. Call light and phone within reach. Discussed fall prevention/safety plan with patient. Instructed to call for assistance and not attempt getting up on her own, must wait for nursing staff to arrive to help her. Stated "okay, I won't get up". Stated comfortable sitting up in chair. Earnstine RegalAshley Traveon Louro, RN

## 2014-04-13 LAB — GLUCOSE, CAPILLARY
GLUCOSE-CAPILLARY: 199 mg/dL — AB (ref 70–99)
Glucose-Capillary: 156 mg/dL — ABNORMAL HIGH (ref 70–99)
Glucose-Capillary: 184 mg/dL — ABNORMAL HIGH (ref 70–99)
Glucose-Capillary: 202 mg/dL — ABNORMAL HIGH (ref 70–99)
Glucose-Capillary: 210 mg/dL — ABNORMAL HIGH (ref 70–99)
Glucose-Capillary: 216 mg/dL — ABNORMAL HIGH (ref 70–99)

## 2014-04-13 LAB — CBC
HCT: 47.1 % — ABNORMAL HIGH (ref 36.0–46.0)
Hemoglobin: 15.5 g/dL — ABNORMAL HIGH (ref 12.0–15.0)
MCH: 28.3 pg (ref 26.0–34.0)
MCHC: 32.9 g/dL (ref 30.0–36.0)
MCV: 85.9 fL (ref 78.0–100.0)
PLATELETS: 246 10*3/uL (ref 150–400)
RBC: 5.48 MIL/uL — ABNORMAL HIGH (ref 3.87–5.11)
RDW: 13.5 % (ref 11.5–15.5)
WBC: 5.7 10*3/uL (ref 4.0–10.5)

## 2014-04-13 LAB — BASIC METABOLIC PANEL
BUN: 70 mg/dL — ABNORMAL HIGH (ref 6–23)
CO2: 43 meq/L — AB (ref 19–32)
CREATININE: 4.63 mg/dL — AB (ref 0.50–1.10)
Calcium: 10.1 mg/dL (ref 8.4–10.5)
Chloride: 76 mEq/L — ABNORMAL LOW (ref 96–112)
GFR, EST AFRICAN AMERICAN: 12 mL/min — AB (ref >90)
GFR, EST NON AFRICAN AMERICAN: 10 mL/min — AB (ref >90)
Glucose, Bld: 174 mg/dL — ABNORMAL HIGH (ref 70–99)
Potassium: 3.7 mEq/L (ref 3.7–5.3)
Sodium: 140 mEq/L (ref 137–147)

## 2014-04-13 LAB — CREATININE, URINE, RANDOM: Creatinine, Urine: 116.29 mg/dL

## 2014-04-13 LAB — HEPATITIS C ANTIBODY: HCV AB: NEGATIVE

## 2014-04-13 LAB — HEPATITIS B SURFACE ANTIGEN: Hepatitis B Surface Ag: NEGATIVE

## 2014-04-13 LAB — SODIUM, URINE, RANDOM: Sodium, Ur: <20 mEq/L

## 2014-04-13 MED ORDER — FUROSEMIDE 10 MG/ML IJ SOLN
200.0000 mg | Freq: Two times a day (BID) | INTRAVENOUS | Status: DC
  Administered 2014-04-13 – 2014-04-14 (×4): 200 mg via INTRAVENOUS
  Filled 2014-04-13 (×5): qty 20

## 2014-04-13 NOTE — Progress Notes (Signed)
Subjective: No bowel movement or flatus yet.  Objective: Vital signs in last 24 hours: Temp:  [99.3 F (37.4 C)-99.7 F (37.6 C)] 99.3 F (37.4 C) (07/20 0656) Pulse Rate:  [120-130] 130 (07/20 0656) Resp:  [18] 18 (07/20 0656) BP: (98-110)/(56-76) 110/60 mmHg (07/20 0656) SpO2:  [90 %-92 %] 92 % (07/20 0656) Last BM Date:  (patient unsure of last bowel movement.)  Intake/Output from previous day: 07/19 0701 - 07/20 0700 In: 1267.1 [P.O.:240; I.V.:1027.1] Out: 6300 [Urine:1400; Emesis/NG output:4900] Intake/Output this shift: Total I/O In: -  Out: 1100 [Emesis/NG output:1100]  General appearance: alert and anxious GI: Soft, nontender, not particularly distended. No bowel sounds appreciated.  Lab Results:   Recent Labs  04/12/14 0646 04/13/14 0539  WBC 4.4 5.7  HGB 14.7 15.5*  HCT 45.1 47.1*  PLT 220 246   BMET  Recent Labs  04/12/14 0646 04/13/14 0539  NA 139 140  K 3.6* 3.7  CL 82* 76*  CO2 40* 43*  GLUCOSE 171* 174*  BUN 50* 70*  CREATININE 2.06* 4.63*  CALCIUM 10.2 10.1   PT/INR No results found for this basename: LABPROT, INR,  in the last 72 hours  Studies/Results: Ct Abdomen Pelvis W Contrast  04/11/2014   CLINICAL DATA:  Abdominal pain with nausea and vomiting today. Left lower quadrant pain. Diabetes. Hemophilia. COPD. Parkinson's disease.  EXAM: CT ABDOMEN AND PELVIS WITH CONTRAST  TECHNIQUE: Multidetector CT imaging of the abdomen and pelvis was performed using the standard protocol following bolus administration of intravenous contrast.  CONTRAST:  50mL OMNIPAQUE IOHEXOL 300 MG/ML SOLN, 80mL OMNIPAQUE IOHEXOL 300 MG/ML SOLN  COMPARISON:  02/1912  FINDINGS: Lower chest: Mild right base volume loss. Normal heart size without pericardial or pleural effusion.  Liver:  Hepatomegaly, 20.3 cm.  No focal liver lesion.  Spleen: Normal  Stomach: Mildly distended and contrast filled.  Pancreas: Normal, without mass or pancreatic ductal dilatation.   Gallbladder/Biliary Tree: Borderline gallbladder distention, without pericholecystic edema. No biliary ductal dilatation.  Kidneys/Adrenals: Normal adrenal glands. Upper pole left renal lesion which measures 15 HU and 2.3 cm. Likely a cyst or minimally complex cyst. Too small to characterize lesions in both kidneys.  Bowel loops: Decompressed colon and distal small bowel.  The duodenum, proximal to mid small bowel are moderately dilated and fluid/ contrast filled. Index jejunal loop measures 3.7 cm on image 46. A "Small bowel feces sign" is identified on image 71. The bowel just distal to this site is decompressed. Example image 62. Transition is identified deep to the more inferior of 2 fat containing umbilical hernias. No underlying mass. No findings to suggest complicating ischemia. No bowel wall thickening or significant mesenteric edema. Surgical clips in the mid abdomen.  Vascular: Age advanced aortic and branch vessel atherosclerosis.  Nodes: No retroperitoneal or retrocrural adenopathy. No pelvic adenopathy.  Pelvic Genitourinary: Hysterectomy. Decompressed urinary bladder. No adnexal mass.  Other: No significant free fluid. Two fat containing ventral abdominal wall/umbilical hernias.  Bones/Musculoskeletal: Advanced lumbar spondylosis.  IMPRESSION: 1. High-grade partial small bowel obstruction to the level of the mid small bowel, likely secondary to adhesions. No evidence of complicating ischemia. Given the extent of gastric and proximal duodenal dilatation, the patient would likely benefit from nasogastric tube placement. 2. Age advanced aortic atherosclerosis. 3. Borderline gallbladder distention, without specific evidence of acute cholecystitis. 4. Hepatomegaly.   Electronically Signed   By: Jeronimo GreavesKyle  Talbot M.D.   On: 04/11/2014 20:09   Dg Abd 2 Views  04/12/2014  CLINICAL DATA:  Small bowel obstruction.  EXAM: ABDOMEN - 2 VIEW  COMPARISON:  CT topogram on 04/11/2014  FINDINGS: A new nasogastric tube is  seen with tip overlying the pylorus or proximal duodenum. Mild decrease in dilated small bowel loops seen since prior study. Multiple surgical clips again seen within the right abdomen and pelvis. No free air identified.  IMPRESSION: Mild decrease in small bowel dilatation. Nasogastric tube tip overlies the pylorus or proximal duodenum.   Electronically Signed   By: Myles Rosenthal M.D.   On: 04/12/2014 12:01    Anti-infectives: Anti-infectives   Start     Dose/Rate Route Frequency Ordered Stop   04/12/14 0100  cefTRIAXone (ROCEPHIN) 1 g in dextrose 5 % 50 mL IVPB     1 g 100 mL/hr over 30 Minutes Intravenous Every 24 hours 04/12/14 0042        Assessment/Plan: Impression: Small bowel obstruction secondary to adhesive disease, complicated by worsening acute renal failure. Patient is become azotemic and this could aggravate her bowel obstruction. Again, it is difficult to reason with the patient do to her mental illness. Would continue NG tube decompression for now. No need for acute surgical intervention. We'll follow with you.  LOS: 2 days    [name] A 04/13/2014

## 2014-04-13 NOTE — Progress Notes (Signed)
[facility] PROGRESS NOTE  [name]:[id number] DOB: [birth year] DOA: 04/11/2014 PCP: No primary provider on file.  Assessment/Plan: 1. High-grade small bowel obstruction- continue with NG tube suction, general surgery has been consulted, no surgery recommended at this time. Will give patient some ice chips. Repeat abdominal xray shows mild improvement in SBO 2. Anxiety- start Ativan 0.5 mg IV every 6 hours when necessary for anxiety 3. Acute kidney injury- patient's creatinine today is elevated to 2.06- 4.63, IV fluids was increased to 125 mL per hour. Possible ? Contrast induced nephropathy, will get nephrology consultation. 4. ? UTI- patient had abnormal UA and was started on Rocephin, urine culture is pending. Patient had MAXIMUM TEMPERATURE of 102.last night 5. History of seizure disorder- continue Depakote, Depakote level 24.2, which is subtherapeutic. Continue IV Depakote 500 mg every 12 hours. We'll start the by mouth Depakote once patient able to take by mouth. 6. History of bipolar disorder- medication on hold as patient is n.p.o., will restart medications once patient able to take  by mouth. 7. Diabetes mellitus- continue sliding scale insulin. 8. DVT prophylaxis- heparin  Code Status: Full code Family Communication: *No family at bedside Disposition Plan: Home when stable   Consultants:  *General surgery  Procedures:  None  Antibiotics:  None  HPI/Subjective: 48 year old female with history of bipolar disorder, epilepsy, diabetes mellitus, COPD on home oxygen a mid abdominal pain nausea and vomiting and found to have small bowel obstruction as per the CT scan abdomen. NG tube placed. Patient feels overall better, but now has worsening renal function. Cr today is 4.63  Objective: Filed Vitals:   04/13/14 0656  BP: 110/60  Pulse: 130  Temp: 99.3 F (37.4 C)  Resp: 18    Intake/Output Summary (Last 24 hours) at 04/13/14 0929 Last data filed at  04/13/14 0500  Gross per 24 hour  Intake 1267.08 ml  Output   5600 ml  Net -4332.92 ml   Filed Weights   04/11/14 1707 04/12/14 0044  Weight: 99.791 kg (220 lb) 99.791 kg (220 lb)    Exam:  Physical Exam: Head: Normocephalic, atraumatic.  Lungs: Normal respiratory effort. B/L Clear to auscultation, no crackles or wheezes.  Heart: Regular RR. S1 and S2 normal  Abdomen: BS normoactive. Soft, Nondistended, non-tender.  Extremities: No pretibial edema, no erythema   Data Reviewed: Basic Metabolic Panel:  Recent Labs Lab 04/11/14 1742 04/12/14 0646 04/13/14 0539  NA 136* 139 140  K 4.1 3.6* 3.7  CL 87* 82* 76*  CO2 34* 40* 43*  GLUCOSE 196* 171* 174*  BUN 39* 50* 70*  CREATININE 1.62* 2.06* 4.63*  CALCIUM 10.6* 10.2 10.1   Liver Function Tests:  Recent Labs Lab 04/11/14 1742 04/12/14 0646  AST 17 43*  ALT 13 17  ALKPHOS 109 102  BILITOT 0.4 0.3  PROT 7.9 7.6  ALBUMIN 3.8 3.7    Recent Labs Lab 04/11/14 1742  LIPASE 32   No results found for this basename: AMMONIA,  in the last 168 hours CBC:  Recent Labs Lab 04/11/14 1742 04/12/14 0646 04/13/14 0539  WBC 5.2 4.4 5.7  HGB 14.8 14.7 15.5*  HCT 44.0 45.1 47.1*  MCV 84.5 85.4 85.9  PLT 230 220 246   Cardiac Enzymes: No results found for this basename: CKTOTAL, CKMB, CKMBINDEX, TROPONINI,  in the last 168 hours BNP (last 3 results) No results found for this basename: PROBNP,  in the last 8760 hours CBG:  Recent Labs Lab 04/12/14 1659 04/12/14  2029 04/13/14 0037 04/13/14 0448 04/13/14 0802  GLUCAP 185* 156* 210* 184* 202*    No results found for this or any previous visit (from the past 240 hour(s)).   Studies: Ct Abdomen Pelvis W Contrast  04/11/2014   CLINICAL DATA:  Abdominal pain with nausea and vomiting today. Left lower quadrant pain. Diabetes. Hemophilia. COPD. Parkinson's disease.  EXAM: CT ABDOMEN AND PELVIS WITH CONTRAST  TECHNIQUE: Multidetector CT imaging of the abdomen and  pelvis was performed using the standard protocol following bolus administration of intravenous contrast.  CONTRAST:  50mL OMNIPAQUE IOHEXOL 300 MG/ML SOLN, 80mL OMNIPAQUE IOHEXOL 300 MG/ML SOLN  COMPARISON:  02/1912  FINDINGS: Lower chest: Mild right base volume loss. Normal heart size without pericardial or pleural effusion.  Liver:  Hepatomegaly, 20.3 cm.  No focal liver lesion.  Spleen: Normal  Stomach: Mildly distended and contrast filled.  Pancreas: Normal, without mass or pancreatic ductal dilatation.  Gallbladder/Biliary Tree: Borderline gallbladder distention, without pericholecystic edema. No biliary ductal dilatation.  Kidneys/Adrenals: Normal adrenal glands. Upper pole left renal lesion which measures 15 HU and 2.3 cm. Likely a cyst or minimally complex cyst. Too small to characterize lesions in both kidneys.  Bowel loops: Decompressed colon and distal small bowel.  The duodenum, proximal to mid small bowel are moderately dilated and fluid/ contrast filled. Index jejunal loop measures 3.7 cm on image 46. A "Small bowel feces sign" is identified on image 71. The bowel just distal to this site is decompressed. Example image 62. Transition is identified deep to the more inferior of 2 fat containing umbilical hernias. No underlying mass. No findings to suggest complicating ischemia. No bowel wall thickening or significant mesenteric edema. Surgical clips in the mid abdomen.  Vascular: Age advanced aortic and branch vessel atherosclerosis.  Nodes: No retroperitoneal or retrocrural adenopathy. No pelvic adenopathy.  Pelvic Genitourinary: Hysterectomy. Decompressed urinary bladder. No adnexal mass.  Other: No significant free fluid. Two fat containing ventral abdominal wall/umbilical hernias.  Bones/Musculoskeletal: Advanced lumbar spondylosis.  IMPRESSION: 1. High-grade partial small bowel obstruction to the level of the mid small bowel, likely secondary to adhesions. No evidence of complicating ischemia. Given  the extent of gastric and proximal duodenal dilatation, the patient would likely benefit from nasogastric tube placement. 2. Age advanced aortic atherosclerosis. 3. Borderline gallbladder distention, without specific evidence of acute cholecystitis. 4. Hepatomegaly.   Electronically Signed   By: Jeronimo GreavesKyle  Talbot M.D.   On: 04/11/2014 20:09   Dg Abd 2 Views  04/12/2014   CLINICAL DATA:  Small bowel obstruction.  EXAM: ABDOMEN - 2 VIEW  COMPARISON:  CT topogram on 04/11/2014  FINDINGS: A new nasogastric tube is seen with tip overlying the pylorus or proximal duodenum. Mild decrease in dilated small bowel loops seen since prior study. Multiple surgical clips again seen within the right abdomen and pelvis. No free air identified.  IMPRESSION: Mild decrease in small bowel dilatation. Nasogastric tube tip overlies the pylorus or proximal duodenum.   Electronically Signed   By: Myles RosenthalJohn  Stahl M.D.   On: 04/12/2014 12:01    Scheduled Meds: . antiseptic oral rinse  15 mL Mouth Rinse BID  . cefTRIAXone (ROCEPHIN)  IV  1 g Intravenous Q24H  . furosemide  200 mg Intravenous BID  . heparin  5,000 Units Subcutaneous Q8H  . insulin aspart  0-15 Units Subcutaneous 6 times per day  . insulin glargine  30 Units Subcutaneous Daily  . valproate sodium  500 mg Intravenous Q12H   Continuous Infusions: .  sodium chloride 135 mL/hr at 04/13/14 9147    Principal Problem:   SBO (small bowel obstruction) Active Problems:   Bipolar disorder   DM type 2 (diabetes mellitus, type 2)   Seizure disorder   ARF (acute renal failure)    Time spent: 25 minutes    [facility]  [facility] Pager [phone]*. If 7PM-7AM, please contact night-coverage at [email or web address], password [facility] 04/13/2014, 9:29 AM  LOS: 2 days

## 2014-04-13 NOTE — Clinical Social Work Psychosocial (Signed)
Clinical Social Work Department BRIEF PSYCHOSOCIAL ASSESSMENT 04/13/2014  Patient:  [name], [name]     Account Number:  [id number]     Admit date:  04/11/2014  Clinical Social Worker:  Edwyna Shell, CLINICAL SOCIAL WORKER  Date/Time:  04/13/2014 02:00 PM  Referred by:  Physician  Date Referred:  04/13/2014 Referred for  Behavioral Health Issues   Other Referral:   Interview type:  Patient Other interview type:    PSYCHOSOCIAL DATA Living Status:  HUSBAND Admitted from facility:   Level of care:   Primary support name:  Allyce Bochicchio Primary support relationship to patient:  SPOUSE Degree of support available:   Lives w spouse, says he is supportive.  Has adult son living nearby    CURRENT CONCERNS Current Concerns  Behavioral Health Issues   Other Concerns:    SOCIAL WORK ASSESSMENT / PLAN CSW met w patient at bedside, patient alert and oriented x4 per chart.  Very anxious, talkative, worried about "two different things being wrong with me."  Voices confusion about medical diagnoses. Says "I just need to get out of here, I can take care of myself."  Anxious about ramp and bathroom redo that is scheduled at her house by [employer], a [employer] of [employer].  Says she needs to get copies of recent bank statements in order to qualify, wants CSW to call husband or son to facilitate this.  Worried that she will miss this opportunity due to being hospitalized.  Also wanted CSW to cancel appt at [facility] - wants to reschedule "when the weather is warmer" and "my husband can get me there in  my wheelchair."  [facility] has been seeing [facility] "since the [facility] health center closed", see Dr Franchot Mimes for medications and also has therapist.  [facility] says they can reschedule for 48-72 hours post discharge, patient agreeable for  CSW to contact agency to set up discharge appointment; however, patient "wants to be sure its a cool day."  Patient says she likes being anxious "because I get things done."  Did not want to discuss anxiety managment strategies, feels like her anxiety is adaptive and enables her to "make things happen."   Assessment/plan status:  Referral to [employer] Other assessment/ plan:   Information/referral to community resources:   [facility] [employer]    PATIENT'S/FAMILY'S RESPONSE TO PLAN OF CARE: Patient focused on getting necessary paperwork to [facility] so she can get ramp and bathroom repairs done later this week.     Edwyna Shell, LCSW Clinical Social Worker [phone])

## 2014-04-13 NOTE — Progress Notes (Signed)
Pt has pulled NG tube out. Refuses to let me put it back in. States she is feeling fine, having no nausea and that she is passing gas. MD called. Dr states it is ok to leave it out unless the pt vomits. Pt educated. Will continue to monitor.

## 2014-04-13 NOTE — Consult Note (Signed)
Reason for Consult: Acute kidney injury Referring Physician: Geral Tuch is an 48 y.o. female.  HPI: She is a patient who has history of hypertension, seizure disorder, diabetes and history of bipolar disorder presently came with complaints of abdominal pain, nausea and vomiting of 24-hour duration. When patient was evaluated in emergency room shows found to have partial small bowel obstruction and admitted to the hospital for further management. Presently consult is called because of worsening of her renal failure. Patient denies any previous history of renal failure, kidney stone or history of proteinuria. Patient states that much while she is swelling of her ankles. Presently patient denies any difficulty breathing and she states that her abdominal pain is also much better.  Past Medical History  Diagnosis Date  . Diabetes mellitus   . Seizures   . Hemophilia   . Hypertension   . Anxiety   . Panic attacks   . Fear of     falling  . COPD (chronic obstructive pulmonary disease)   . Parkinson's disease     Past Surgical History  Procedure Laterality Date  . Abdominal hysterectomy    . Oophorectomy      History reviewed. No pertinent family history.  Social History:  reports that she has never smoked. She does not have any smokeless tobacco history on file. She reports that she does not drink alcohol or use illicit drugs.  Allergies: No Known Allergies  Medications: I have reviewed the patient's current medications.  Results for orders placed during the hospital encounter of 04/11/14 (from the past 48 hour(s))  CBC     Status: Abnormal   Collection Time    04/11/14  5:42 PM      Result Value Ref Range   WBC 5.2  4.0 - 10.5 K/uL   RBC 5.21 (*) 3.87 - 5.11 MIL/uL   Hemoglobin 14.8  12.0 - 15.0 g/dL   HCT 44.0  36.0 - 46.0 %   MCV 84.5  78.0 - 100.0 fL   MCH 28.4  26.0 - 34.0 pg   MCHC 33.6  30.0 - 36.0 g/dL   RDW 13.3  11.5 - 15.5 %   Platelets 230  150 - 400  K/uL  COMPREHENSIVE METABOLIC PANEL     Status: Abnormal   Collection Time    04/11/14  5:42 PM      Result Value Ref Range   Sodium 136 (*) 137 - 147 mEq/L   Potassium 4.1  3.7 - 5.3 mEq/L   Chloride 87 (*) 96 - 112 mEq/L   CO2 34 (*) 19 - 32 mEq/L   Glucose, Bld 196 (*) 70 - 99 mg/dL   BUN 39 (*) 6 - 23 mg/dL   Creatinine, Ser 1.62 (*) 0.50 - 1.10 mg/dL   Calcium 10.6 (*) 8.4 - 10.5 mg/dL   Total Protein 7.9  6.0 - 8.3 g/dL   Albumin 3.8  3.5 - 5.2 g/dL   AST 17  0 - 37 U/L   ALT 13  0 - 35 U/L   Alkaline Phosphatase 109  39 - 117 U/L   Total Bilirubin 0.4  0.3 - 1.2 mg/dL   GFR calc non Af Amer 36 (*) >90 mL/min   GFR calc Af Amer 42 (*) >90 mL/min   Comment: (NOTE)     The eGFR has been calculated using the CKD EPI equation.     This calculation has not been validated in all clinical situations.  eGFR's persistently <90 mL/min signify possible Chronic Kidney     Disease.   Anion gap 15  5 - 15  LIPASE, BLOOD     Status: None   Collection Time    04/11/14  5:42 PM      Result Value Ref Range   Lipase 32  11 - 59 U/L  I-STAT CG4 LACTIC ACID, ED     Status: Abnormal   Collection Time    04/11/14  5:52 PM      Result Value Ref Range   Lactic Acid, Venous 2.37 (*) 0.5 - 2.2 mmol/L  URINALYSIS, ROUTINE W REFLEX MICROSCOPIC     Status: Abnormal   Collection Time    04/11/14  7:27 PM      Result Value Ref Range   Color, Urine AMBER (*) YELLOW   Comment: BIOCHEMICALS MAY BE AFFECTED BY COLOR   APPearance CLOUDY (*) CLEAR   Specific Gravity, Urine 1.020  1.005 - 1.030   pH 6.0  5.0 - 8.0   Glucose, UA NEGATIVE  NEGATIVE mg/dL   Hgb urine dipstick NEGATIVE  NEGATIVE   Bilirubin Urine SMALL (*) NEGATIVE   Ketones, ur 15 (*) NEGATIVE mg/dL   Protein, ur 100 (*) NEGATIVE mg/dL   Urobilinogen, UA 0.2  0.0 - 1.0 mg/dL   Nitrite NEGATIVE  NEGATIVE   Leukocytes, UA TRACE (*) NEGATIVE  URINE MICROSCOPIC-ADD ON     Status: Abnormal   Collection Time    04/11/14  7:27 PM       Result Value Ref Range   Squamous Epithelial / LPF FEW (*) RARE   WBC, UA 3-6  <3 WBC/hpf   Bacteria, UA MANY (*) RARE  VALPROIC ACID LEVEL     Status: Abnormal   Collection Time    04/12/14  1:37 AM      Result Value Ref Range   Valproic Acid Lvl 24.2 (*) 50.0 - 100.0 ug/mL  GLUCOSE, CAPILLARY     Status: Abnormal   Collection Time    04/12/14  1:39 AM      Result Value Ref Range   Glucose-Capillary 162 (*) 70 - 99 mg/dL   Comment 1 Notify RN    GLUCOSE, CAPILLARY     Status: Abnormal   Collection Time    04/12/14  5:28 AM      Result Value Ref Range   Glucose-Capillary 163 (*) 70 - 99 mg/dL   Comment 1 Notify RN    HEMOGLOBIN A1C     Status: Abnormal   Collection Time    04/12/14  6:46 AM      Result Value Ref Range   Hemoglobin A1C 7.3 (*) <5.7 %   Comment: (NOTE)                                                                               According to the ADA Clinical Practice Recommendations for 2011, when     HbA1c is used as a screening test:      >=6.5%   Diagnostic of Diabetes Mellitus               (if abnormal result is confirmed)  5.7-6.4%   Increased risk of developing Diabetes Mellitus     References:Diagnosis and Classification of Diabetes Mellitus,Diabetes     ZSMO,7078,67(JQGBE 1):S62-S69 and Standards of Medical Care in             Diabetes - 2011,Diabetes EFEO,7121,97 (Suppl 1):S11-S61.   Mean Plasma Glucose 163 (*) <117 mg/dL   Comment: Performed at [facility]     Status: Abnormal   Collection Time    04/12/14  6:46 AM      Result Value Ref Range   Sodium 139  137 - 147 mEq/L   Potassium 3.6 (*) 3.7 - 5.3 mEq/L   Chloride 82 (*) 96 - 112 mEq/L   CO2 40 (*) 19 - 32 mEq/L   Comment: CRITICAL RESULT CALLED TO, READ BACK BY AND VERIFIED WITH:     ROGERS,A AT 0757 BY GODFREY,O ON 04/12/14   Glucose, Bld 171 (*) 70 - 99 mg/dL   BUN 50 (*) 6 - 23 mg/dL   Creatinine, Ser 2.06 (*) 0.50 - 1.10 mg/dL   Calcium  10.2  8.4 - 10.5 mg/dL   Total Protein 7.6  6.0 - 8.3 g/dL   Albumin 3.7  3.5 - 5.2 g/dL   AST 43 (*) 0 - 37 U/L   ALT 17  0 - 35 U/L   Alkaline Phosphatase 102  39 - 117 U/L   Total Bilirubin 0.3  0.3 - 1.2 mg/dL   GFR calc non Af Amer 27 (*) >90 mL/min   GFR calc Af Amer 31 (*) >90 mL/min   Comment: (NOTE)     The eGFR has been calculated using the CKD EPI equation.     This calculation has not been validated in all clinical situations.     eGFR's persistently <90 mL/min signify possible Chronic Kidney     Disease.   Anion gap 17 (*) 5 - 15  CBC     Status: Abnormal   Collection Time    04/12/14  6:46 AM      Result Value Ref Range   WBC 4.4  4.0 - 10.5 K/uL   RBC 5.28 (*) 3.87 - 5.11 MIL/uL   Hemoglobin 14.7  12.0 - 15.0 g/dL   HCT 45.1  36.0 - 46.0 %   MCV 85.4  78.0 - 100.0 fL   MCH 27.8  26.0 - 34.0 pg   MCHC 32.6  30.0 - 36.0 g/dL   RDW 13.5  11.5 - 15.5 %   Platelets 220  150 - 400 K/uL  GLUCOSE, CAPILLARY     Status: Abnormal   Collection Time    04/12/14  8:01 AM      Result Value Ref Range   Glucose-Capillary 180 (*) 70 - 99 mg/dL   Comment 1 Notify RN    GLUCOSE, CAPILLARY     Status: Abnormal   Collection Time    04/12/14 11:28 AM      Result Value Ref Range   Glucose-Capillary 186 (*) 70 - 99 mg/dL   Comment 1 Notify RN    GLUCOSE, CAPILLARY     Status: Abnormal   Collection Time    04/12/14  4:59 PM      Result Value Ref Range   Glucose-Capillary 185 (*) 70 - 99 mg/dL   Comment 1 Notify RN    GLUCOSE, CAPILLARY     Status: Abnormal   Collection Time    04/12/14  8:29 PM  Result Value Ref Range   Glucose-Capillary 156 (*) 70 - 99 mg/dL   Comment 1 Documented in Chart     Comment 2 Notify RN    GLUCOSE, CAPILLARY     Status: Abnormal   Collection Time    04/13/14 12:37 AM      Result Value Ref Range   Glucose-Capillary 210 (*) 70 - 99 mg/dL   Comment 1 Documented in Chart     Comment 2 Notify RN    GLUCOSE, CAPILLARY     Status: Abnormal    Collection Time    04/13/14  4:48 AM      Result Value Ref Range   Glucose-Capillary 184 (*) 70 - 99 mg/dL   Comment 1 Documented in Chart     Comment 2 Notify RN    CBC     Status: Abnormal   Collection Time    04/13/14  5:39 AM      Result Value Ref Range   WBC 5.7  4.0 - 10.5 K/uL   RBC 5.48 (*) 3.87 - 5.11 MIL/uL   Hemoglobin 15.5 (*) 12.0 - 15.0 g/dL   HCT 47.1 (*) 36.0 - 46.0 %   MCV 85.9  78.0 - 100.0 fL   MCH 28.3  26.0 - 34.0 pg   MCHC 32.9  30.0 - 36.0 g/dL   RDW 13.5  11.5 - 15.5 %   Platelets 246  150 - 400 K/uL  BASIC METABOLIC PANEL     Status: Abnormal   Collection Time    04/13/14  5:39 AM      Result Value Ref Range   Sodium 140  137 - 147 mEq/L   Potassium 3.7  3.7 - 5.3 mEq/L   Chloride 76 (*) 96 - 112 mEq/L   CO2 43 (*) 19 - 32 mEq/L   Comment: CRITICAL RESULT CALLED TO, READ BACK BY AND VERIFIED WITH:     ROGERS,L AT 7:00AM ON 04/13/14 BY FESTERMAN,C   Glucose, Bld 174 (*) 70 - 99 mg/dL   BUN 70 (*) 6 - 23 mg/dL   Creatinine, Ser 4.63 (*) 0.50 - 1.10 mg/dL   Comment: DELTA CHECK NOTED   Calcium 10.1  8.4 - 10.5 mg/dL   GFR calc non Af Amer 10 (*) >90 mL/min   GFR calc Af Amer 12 (*) >90 mL/min   Comment: (NOTE)     The eGFR has been calculated using the CKD EPI equation.     This calculation has not been validated in all clinical situations.     eGFR's persistently <90 mL/min signify possible Chronic Kidney     Disease.    Ct Abdomen Pelvis W Contrast  04/11/2014   CLINICAL DATA:  Abdominal pain with nausea and vomiting today. Left lower quadrant pain. Diabetes. Hemophilia. COPD. Parkinson's disease.  EXAM: CT ABDOMEN AND PELVIS WITH CONTRAST  TECHNIQUE: Multidetector CT imaging of the abdomen and pelvis was performed using the standard protocol following bolus administration of intravenous contrast.  CONTRAST:  57m OMNIPAQUE IOHEXOL 300 MG/ML SOLN, 818mOMNIPAQUE IOHEXOL 300 MG/ML SOLN  COMPARISON:  02/1912  FINDINGS: Lower chest: Mild right base  volume loss. Normal heart size without pericardial or pleural effusion.  Liver:  Hepatomegaly, 20.3 cm.  No focal liver lesion.  Spleen: Normal  Stomach: Mildly distended and contrast filled.  Pancreas: Normal, without mass or pancreatic ductal dilatation.  Gallbladder/Biliary Tree: Borderline gallbladder distention, without pericholecystic edema. No biliary ductal dilatation.  Kidneys/Adrenals: Normal adrenal glands. Upper  pole left renal lesion which measures 15 HU and 2.3 cm. Likely a cyst or minimally complex cyst. Too small to characterize lesions in both kidneys.  Bowel loops: Decompressed colon and distal small bowel.  The duodenum, proximal to mid small bowel are moderately dilated and fluid/ contrast filled. Index jejunal loop measures 3.7 cm on image 46. A "Small bowel feces sign" is identified on image 71. The bowel just distal to this site is decompressed. Example image 62. Transition is identified deep to the more inferior of 2 fat containing umbilical hernias. No underlying mass. No findings to suggest complicating ischemia. No bowel wall thickening or significant mesenteric edema. Surgical clips in the mid abdomen.  Vascular: Age advanced aortic and branch vessel atherosclerosis.  Nodes: No retroperitoneal or retrocrural adenopathy. No pelvic adenopathy.  Pelvic Genitourinary: Hysterectomy. Decompressed urinary bladder. No adnexal mass.  Other: No significant free fluid. Two fat containing ventral abdominal wall/umbilical hernias.  Bones/Musculoskeletal: Advanced lumbar spondylosis.  IMPRESSION: 1. High-grade partial small bowel obstruction to the level of the mid small bowel, likely secondary to adhesions. No evidence of complicating ischemia. Given the extent of gastric and proximal duodenal dilatation, the patient would likely benefit from nasogastric tube placement. 2. Age advanced aortic atherosclerosis. 3. Borderline gallbladder distention, without specific evidence of acute cholecystitis. 4.  Hepatomegaly.   Electronically Signed   By: Abigail Miyamoto M.D.   On: 04/11/2014 20:09   Dg Abd 2 Views  04/12/2014   CLINICAL DATA:  Small bowel obstruction.  EXAM: ABDOMEN - 2 VIEW  COMPARISON:  CT topogram on 04/11/2014  FINDINGS: A new nasogastric tube is seen with tip overlying the pylorus or proximal duodenum. Mild decrease in dilated small bowel loops seen since prior study. Multiple surgical clips again seen within the right abdomen and pelvis. No free air identified.  IMPRESSION: Mild decrease in small bowel dilatation. Nasogastric tube tip overlies the pylorus or proximal duodenum.   Electronically Signed   By: Earle Gell M.D.   On: 04/12/2014 12:01    Review of Systems  Respiratory: Negative for shortness of breath.   Cardiovascular: Positive for leg swelling.  Gastrointestinal: Positive for nausea, vomiting and abdominal pain.  Neurological: Positive for seizures.   Blood pressure 110/60, pulse 130, temperature 99.3 F (37.4 C), temperature source Oral, resp. rate 18, height '5\' 8"'  (1.727 m), weight 99.791 kg (220 lb), SpO2 92.00%. Physical Exam  Constitutional: She is oriented to person, place, and time. No distress.  Eyes: No scleral icterus.  Neck: No JVD present.  Cardiovascular: Normal rate and regular rhythm.   Respiratory: No respiratory distress. She has no wheezes.  GI: She exhibits distension. There is no tenderness. There is no rebound.  Musculoskeletal: She exhibits no edema.  Neurological: She is alert and oriented to person, place, and time.    Assessment/Plan: Problem #1 acute kidney injury: Presently he function seems to be worsening. When she came her creatinine was 1.62 and increased to present level of 4.63. The last blood work she had was about 2 years ago with creatinine of 1. Hence has this moment not sure whether patient has underlying chronic renal failure. The present increasing in her BUN and creatinine seems to be most likely dye-induced however prerenal  syndrome/ATN cannot ruled out. Problem #2 diabetes: Long-standing. Patient denies any previous history of diabetic retinopathy or neuropathy. Problem #3 hypertension Problem #4 history of COPD Problem #5 history of partial small bowel obstruction. Presently patient is on NG suction and abdominal pain  and distention is getting better. Problem #6 history of seizure disorder Problem #7 history of bipolar disorder Problem #8 history of Parkinson's disease Plan: Agree with hydration and will increase her IV fluid to 135 cc per hour We'll check her urine sodium, creatinine. Will start patient on Lasix 60 mg IV twice a day to improve her urine output. We'll check ANA, hepatitis B surface antigen, hepatitis C antibody, compliment. We'll check a 24-hour urine for protein.    Alam Guterrez S 04/13/2014, 8:46 AM

## 2014-04-13 NOTE — Progress Notes (Signed)
UR chart review completed.  

## 2014-04-13 NOTE — Progress Notes (Signed)
Dr. Sharl MaLama on the floor to see patient. Informed him that patient had a critical CO2 level of 43.

## 2014-04-14 ENCOUNTER — Inpatient Hospital Stay (HOSPITAL_COMMUNITY): Payer: Medicaid Other

## 2014-04-14 DIAGNOSIS — F319 Bipolar disorder, unspecified: Secondary | ICD-10-CM

## 2014-04-14 DIAGNOSIS — J9600 Acute respiratory failure, unspecified whether with hypoxia or hypercapnia: Secondary | ICD-10-CM

## 2014-04-14 LAB — BLOOD GAS, ARTERIAL
Acid-Base Excess: 16 mmol/L — ABNORMAL HIGH (ref 0.0–2.0)
Bicarbonate: 40.2 mEq/L — ABNORMAL HIGH (ref 20.0–24.0)
DRAWN BY: 25788
O2 CONTENT: 2 L/min
O2 Saturation: 82.5 %
PATIENT TEMPERATURE: 37
PH ART: 7.534 — AB (ref 7.350–7.450)
PO2 ART: 49.3 mmHg — AB (ref 80.0–100.0)
TCO2: 33.9 mmol/L (ref 0–100)
pCO2 arterial: 47.9 mmHg — ABNORMAL HIGH (ref 35.0–45.0)

## 2014-04-14 LAB — CBC
HCT: 44.5 % (ref 36.0–46.0)
HEMATOCRIT: 37.2 % (ref 36.0–46.0)
HEMOGLOBIN: 14.4 g/dL (ref 12.0–15.0)
Hemoglobin: 12.2 g/dL (ref 12.0–15.0)
MCH: 27.7 pg (ref 26.0–34.0)
MCH: 27.8 pg (ref 26.0–34.0)
MCHC: 32.4 g/dL (ref 30.0–36.0)
MCHC: 32.8 g/dL (ref 30.0–36.0)
MCV: 85.7 fL (ref 78.0–100.0)
MCV: 84.7 fL (ref 78.0–100.0)
Platelets: 281 10*3/uL (ref 150–400)
Platelets: 220 10*3/uL (ref 150–400)
RBC: 5.19 MIL/uL — ABNORMAL HIGH (ref 3.87–5.11)
RBC: 4.39 MIL/uL (ref 3.87–5.11)
RDW: 13.6 % (ref 11.5–15.5)
RDW: 13.6 % (ref 11.5–15.5)
WBC: 8.2 10*3/uL (ref 4.0–10.5)
WBC: 7.2 10*3/uL (ref 4.0–10.5)

## 2014-04-14 LAB — CK: Total CK: 1032 U/L — ABNORMAL HIGH (ref 7–177)

## 2014-04-14 LAB — GLUCOSE, CAPILLARY
Glucose-Capillary: 206 mg/dL — ABNORMAL HIGH (ref 70–99)
Glucose-Capillary: 208 mg/dL — ABNORMAL HIGH (ref 70–99)
Glucose-Capillary: 228 mg/dL — ABNORMAL HIGH (ref 70–99)
Glucose-Capillary: 217 mg/dL — ABNORMAL HIGH (ref 70–99)
Glucose-Capillary: 145 mg/dL — ABNORMAL HIGH (ref 70–99)

## 2014-04-14 LAB — BASIC METABOLIC PANEL
Anion gap: 21 — ABNORMAL HIGH (ref 5–15)
BUN: 94 mg/dL — AB (ref 6–23)
BUN: 98 mg/dL — AB (ref 6–23)
CO2: 40 mEq/L (ref 19–32)
CO2: 35 mEq/L — ABNORMAL HIGH (ref 19–32)
Calcium: 9.5 mg/dL (ref 8.4–10.5)
Calcium: 8.2 mg/dL — ABNORMAL LOW (ref 8.4–10.5)
Chloride: 75 mEq/L — ABNORMAL LOW (ref 96–112)
Chloride: 80 mEq/L — ABNORMAL LOW (ref 96–112)
Creatinine, Ser: 8.64 mg/dL — ABNORMAL HIGH (ref 0.50–1.10)
Creatinine, Ser: 9.53 mg/dL — ABNORMAL HIGH (ref 0.50–1.10)
GFR calc Af Amer: 6 mL/min — ABNORMAL LOW (ref >90)
GFR calc Af Amer: 5 mL/min — ABNORMAL LOW (ref >90)
GFR calc non Af Amer: 5 mL/min — ABNORMAL LOW (ref >90)
GFR, EST NON AFRICAN AMERICAN: 4 mL/min — AB (ref >90)
GLUCOSE: 202 mg/dL — AB (ref 70–99)
Glucose, Bld: 188 mg/dL — ABNORMAL HIGH (ref 70–99)
POTASSIUM: 3.5 meq/L — AB (ref 3.7–5.3)
POTASSIUM: 3.4 meq/L — AB (ref 3.7–5.3)
Sodium: 140 mEq/L (ref 137–147)
Sodium: 136 mEq/L — ABNORMAL LOW (ref 137–147)

## 2014-04-14 LAB — C3 COMPLEMENT: C3 Complement: 227 mg/dL — ABNORMAL HIGH (ref 90–180)

## 2014-04-14 LAB — C4 COMPLEMENT: Complement C4, Body Fluid: 62 mg/dL — ABNORMAL HIGH (ref 10–40)

## 2014-04-14 LAB — PHOSPHORUS: PHOSPHORUS: 6.5 mg/dL — AB (ref 2.3–4.6)

## 2014-04-14 LAB — LACTATE DEHYDROGENASE: LDH: 221 U/L (ref 94–250)

## 2014-04-14 LAB — ANA: Anti Nuclear Antibody(ANA): NEGATIVE

## 2014-04-14 LAB — LACTIC ACID, PLASMA: Lactic Acid, Venous: 1.6 mmol/L (ref 0.5–2.2)

## 2014-04-14 MED ORDER — METOPROLOL SUCCINATE ER 50 MG PO TB24: 50.0000 mg | ORAL_TABLET | ORAL | Status: DC

## 2014-04-14 MED ORDER — PHENYLEPHRINE HCL 10 MG/ML IJ SOLN
30.0000 ug/min | INTRAVENOUS | Status: DC
  Administered 2014-04-14: 70 ug/min via INTRAVENOUS
  Administered 2014-04-15: 160 ug/min via INTRAVENOUS
  Administered 2014-04-15: 155 ug/min via INTRAVENOUS
  Administered 2014-04-15 (×2): 180 ug/min via INTRAVENOUS
  Administered 2014-04-15: 175 ug/min via INTRAVENOUS
  Filled 2014-04-14 (×6): qty 4

## 2014-04-14 MED ORDER — PHENYLEPHRINE HCL 10 MG/ML IJ SOLN
INTRAMUSCULAR | Status: AC
  Filled 2014-04-14: qty 1

## 2014-04-14 MED ORDER — ARIPIPRAZOLE 15 MG PO TABS
30.0000 mg | ORAL_TABLET | ORAL | Status: DC
  Administered 2014-04-14: 30 mg via ORAL
  Administered 2014-04-15: 20 mg via ORAL
  Administered 2014-04-16 – 2014-04-20 (×5): 30 mg via ORAL
  Filled 2014-04-14 (×2): qty 2
  Filled 2014-04-14: qty 3
  Filled 2014-04-14 (×6): qty 2

## 2014-04-14 MED ORDER — GABAPENTIN 300 MG PO CAPS
900.0000 mg | ORAL_CAPSULE | Freq: Every day | ORAL | Status: DC
  Administered 2014-04-14: 900 mg via ORAL
  Filled 2014-04-14: qty 3

## 2014-04-14 MED ORDER — DOCUSATE SODIUM 100 MG PO CAPS
100.0000 mg | ORAL_CAPSULE | Freq: Two times a day (BID) | ORAL | Status: DC
  Administered 2014-04-14 – 2014-04-15 (×2): 100 mg via ORAL
  Filled 2014-04-14 (×6): qty 1

## 2014-04-14 MED ORDER — PIPERACILLIN-TAZOBACTAM IN DEX 2-0.25 GM/50ML IV SOLN
INTRAVENOUS | Status: AC
  Filled 2014-04-14: qty 100

## 2014-04-14 MED ORDER — CLONAZEPAM 1 MG PO TABS
1.0000 mg | ORAL_TABLET | Freq: Two times a day (BID) | ORAL | Status: DC
  Administered 2014-04-14 – 2014-04-20 (×12): 1 mg via ORAL
  Filled 2014-04-14: qty 2
  Filled 2014-04-14: qty 1
  Filled 2014-04-14 (×2): qty 2
  Filled 2014-04-14 (×2): qty 1
  Filled 2014-04-14 (×2): qty 2
  Filled 2014-04-14 (×3): qty 1
  Filled 2014-04-14: qty 2

## 2014-04-14 MED ORDER — INSULIN GLARGINE 100 UNIT/ML ~~LOC~~ SOLN
35.0000 [IU] | Freq: Every day | SUBCUTANEOUS | Status: DC
  Administered 2014-04-14: 35 [IU] via SUBCUTANEOUS
  Filled 2014-04-14 (×2): qty 0.35

## 2014-04-14 MED ORDER — PHENYLEPHRINE HCL 10 MG/ML IJ SOLN
30.0000 ug/min | INTRAVENOUS | Status: DC
  Administered 2014-04-14: 30 ug/min via INTRAVENOUS
  Filled 2014-04-14: qty 1

## 2014-04-14 MED ORDER — SODIUM CHLORIDE 0.9 % IV BOLUS (SEPSIS)
1000.0000 mL | Freq: Once | INTRAVENOUS | Status: AC
  Administered 2014-04-14: 1000 mL via INTRAVENOUS

## 2014-04-14 MED ORDER — VANCOMYCIN HCL 10 G IV SOLR
1500.0000 mg | Freq: Once | INTRAVENOUS | Status: AC
  Administered 2014-04-14: 1500 mg via INTRAVENOUS
  Filled 2014-04-14: qty 1500

## 2014-04-14 MED ORDER — LISINOPRIL 10 MG PO TABS: 10.0000 mg | ORAL_TABLET | ORAL | Status: DC

## 2014-04-14 MED ORDER — PIPERACILLIN-TAZOBACTAM IN DEX 2-0.25 GM/50ML IV SOLN
2.2500 g | Freq: Three times a day (TID) | INTRAVENOUS | Status: DC
  Administered 2014-04-14 – 2014-04-16 (×5): 2.25 g via INTRAVENOUS
  Filled 2014-04-14 (×7): qty 50

## 2014-04-14 MED ORDER — DIVALPROEX SODIUM 500 MG PO DR TAB
1500.0000 mg | DELAYED_RELEASE_TABLET | Freq: Every day | ORAL | Status: DC
  Administered 2014-04-15 – 2014-04-19 (×5): 1500 mg via ORAL
  Filled 2014-04-14 (×7): qty 3

## 2014-04-14 NOTE — Progress Notes (Signed)
[facility] called to [place] on [facility] at [facility] Pt being transported per EMS and RN dorothy accompanying patient.

## 2014-04-14 NOTE — Progress Notes (Signed)
Patient with critical lab CO2 40. Dr. Sharl MaLama notified.

## 2014-04-14 NOTE — H&P (Signed)
PULMONARY / CRITICAL CARE MEDICINE   Name: Casey MannsJanet F Lapka MRN: [id number] DOB: 1966-05-07    ADMISSION DATE:  04/11/2014 CONSULTATION DATE:  04/14/14  REFERRING MD :  Mauro KaufmannGagan Lama, MD - [facility]([place]) PRIMARY SERVICE: PCCM (transferred from [facility] to [facility])  CHIEF COMPLAINT:  Hypotension, worsening acute renal failure, recent SBO  BRIEF PATIENT DESCRIPTION: [age] yr F with PMH baseline MR, DM2, bipolar, anxiety, seizure disorder, HTN, COPD (home O2) who presented to [facility] ED on 04/11/14 for abdominal pain, n/v, CT abd with high-grade SBO (likely due to hx adhesions) resolved with NGT had BMs on 7/21, advanced diet. Found to have E.Coli UTI and acute worsening Cr to 9.53 in < 72 hrs (b/l ~1), Renal concern for prerenal component, contrast nephropathy vs ATN, on high dose Lasix to inc UOP, currently with minimal UOP. Acute event 7/21 with persistent hypotension, continued IVF, DC'd Lasix, transferred to ICU started on Neo gtt, too unstable for HD, suspect needed CVVH, [facility] on 04/14/14.  SIGNIFICANT EVENTS / STUDIES:  7/18 - Admitted [facility] CT Abd  LINES / TUBES: ETT 7/22>>> R-IJ HD Cath, 7/22 >>> R-Femoral Art Line 7/22 >>> PIVs  CULTURES: Urine 7/18 >>> E.Coli UTI (pending s/s) Blood x2, 7/22 >>>  ANTIBIOTICS: Ceftriaxone 7/19 >>> 7/21 Vancomycin 7/21 >>> Zosyn 7/21 >>>  HISTORY OF PRESENT ILLNESS: Transferred 04/14/14 from [facility] ICU, see "Brief Patient Description" above for summary of prior presentation on admission and outside hospital course.  Patient unable to provide accurate history at this time, limited by baseline mental status and acute encephalopathy. No family at bedside for detailed history.  PAST MEDICAL HISTORY :  Past Medical History  Diagnosis Date  . Diabetes mellitus   . Seizures   . Hemophilia   . Hypertension   . Anxiety   . Panic attacks   . Fear of     falling  . COPD (chronic obstructive  pulmonary disease)   . Parkinson's disease    Past Surgical History  Procedure Laterality Date  . Abdominal hysterectomy    . Oophorectomy     Prior to Admission medications   Medication Sig Start Date End Date Taking? Authorizing Provider  ARIPiprazole (ABILIFY) 30 MG tablet Take 30 mg by mouth every morning.     Yes Historical Provider, MD  aspirin EC 81 MG tablet Take 81 mg by mouth every morning.     Yes Historical Provider, MD  calcium carbonate (OS-CAL) 600 MG TABS Take 600 mg by mouth 2 (two) times daily with a meal.     Yes Historical Provider, MD  clonazePAM (KLONOPIN) 1 MG tablet Take 1 mg by mouth 2 (two) times daily.    Yes Historical Provider, MD  divalproex (DEPAKOTE) 250 MG DR tablet Take 1,500 mg by mouth at bedtime.    Yes Historical Provider, MD  docusate sodium (COLACE) 100 MG capsule Take 100 mg by mouth 3 (three) times daily.     Yes Historical Provider, MD  gabapentin (NEURONTIN) 300 MG capsule Take 900 mg by mouth daily at 12 noon.    Yes Historical Provider, MD  gemfibrozil (LOPID) 600 MG tablet Take 600 mg by mouth 2 (two) times daily before a meal.     Yes Historical Provider, MD  glipiZIDE (GLUCOTROL) 10 MG tablet Take 10 mg by mouth 2 (two) times daily before a meal.     Yes Historical Provider, MD  insulin glargine (LANTUS) 100  UNIT/ML injection Inject 70 Units into the skin every morning.     Yes Historical Provider, MD  isosorbide mononitrate (IMDUR) 60 MG 24 hr tablet Take 60 mg by mouth every morning.     Yes Historical Provider, MD  lisinopril (PRINIVIL,ZESTRIL) 10 MG tablet Take 10 mg by mouth every morning.     Yes Historical Provider, MD  metoprolol (TOPROL-XL) 50 MG 24 hr tablet Take 50 mg by mouth every morning.    Yes Historical Provider, MD  Multiple Vitamins-Minerals (MULTIVITAMINS THER. W/MINERALS) TABS Take 1 tablet by mouth every morning.     Yes Historical Provider, MD  oxybutynin (DITROPAN) 5 MG tablet Take 5 mg by mouth every morning.     Yes  Historical Provider, MD  repaglinide (PRANDIN) 0.5 MG tablet Take 0.5 mg by mouth 3 (three) times daily before meals.   Yes Historical Provider, MD  traMADol (ULTRAM) 50 MG tablet Take 50 mg by mouth at bedtime. For pain   Yes Historical Provider, MD  ibuprofen (ADVIL,MOTRIN) 800 MG tablet Take 800 mg by mouth every 8 (eight) hours as needed. For pain      Historical Provider, MD   No Known Allergies  FAMILY HISTORY:  History reviewed. No pertinent family history. SOCIAL HISTORY:  reports that she has never smoked. She does not have any smokeless tobacco history on file. She reports that she does not drink alcohol or use illicit drugs.  VITAL SIGNS: Temp:  [97.8 F (36.6 C)-101.5 F (38.6 C)] 98.3 F (36.8 C) (07/21 2030) Pulse Rate:  [70-133] 92 (07/21 2119) Resp:  [20-29] 29 (07/21 2119) BP: (76-100)/(34-57) 88/52 mmHg (07/21 2119) SpO2:  [82 %-94 %] 94 % (07/21 2119) Weight:  [311 lb (141.069 kg)] 311 lb (141.069 kg) (07/21 2030) HEMODYNAMICS:   VENTILATOR SETTINGS:   INTAKE / OUTPUT: Intake/Output     07/21 0701 - 07/22 0700   P.O. 600   I.V. (mL/kg)    IV Piggyback    Total Intake(mL/kg) 600 (4.3)   Urine (mL/kg/hr)    Emesis/NG output    Total Output     Net +600         PHYSICAL EXAMINATION: General: sleeping, initially awakens on exam but increasingly somnolent, appears comfortable, NAD, agonal Neuro:  somnolent, wakes up to voice / touch, poor following commands HEENT:  NCAT, PERRL, EOMI, mild dry MM Cardiovascular:  Tachycardic, regular rhythm, no murmurs heard Lungs: Coarse breath sounds b/l, without wheezing or focal finding. +transmitted upper airway breath sounds Abdomen:  Obese, non-tender, non-distended, no rebound or guarding, +hypoactive BS diffusely Ext: +bilateral calf tenderness with some generalized erythema, consistent with some chronic venous stasis changes, +1 pitting bilateral edema, intact distal pulses Skin:  Warm, dry, no rashes or  wounds  LABS:  CBC  Recent Labs Lab 04/13/14 0539 04/14/14 0557 04/14/14 1920  WBC 5.7 8.2 7.2  HGB 15.5* 14.4 12.2  HCT 47.1* 44.5 37.2  PLT 246 281 220   Coag's No results found for this basename: APTT, INR,  in the last 168 hours BMET  Recent Labs Lab 04/13/14 0539 04/14/14 0557 04/14/14 1920  NA 140 140 136*  K 3.7 3.5* 3.4*  CL 76* 75* 80*  CO2 43* 40* 35*  BUN 70* 94* 98*  CREATININE 4.63* 8.64* 9.53*  GLUCOSE 174* 202* 188*   Electrolytes  Recent Labs Lab 04/13/14 0539 04/14/14 0556 04/14/14 0557 04/14/14 1920  CALCIUM 10.1  --  9.5 8.2*  PHOS  --  6.5*  --   --  Sepsis Markers  Recent Labs Lab 04/11/14 1752 04/14/14 1920  LATICACIDVEN 2.37* 1.6   ABG  Recent Labs Lab 04/14/14 0900  PHART 7.534*  PCO2ART 47.9*  PO2ART 49.3*   Liver Enzymes  Recent Labs Lab 04/11/14 1742 04/12/14 0646  AST 17 43*  ALT 13 17  ALKPHOS 109 102  BILITOT 0.4 0.3  ALBUMIN 3.8 3.7   Cardiac Enzymes No results found for this basename: TROPONINI, PROBNP,  in the last 168 hours Glucose  Recent Labs Lab 04/13/14 1722 04/13/14 2137 04/14/14 0737 04/14/14 1208 04/14/14 1605 04/14/14 2150  GLUCAP 199* 206* 208* 228* 217* 145*    Imaging US Renal  04/14/2014   CLINICAL DATA:  48 year old female with acute renal insufficiency.  EXAM: RENAL/URINARY TRACT ULTRASOUND COMPLETE  COMPARISON:  04/11/2014 and prior CTs dating back to 09/12/2010. 04/11/2007 abdominal ultrasound.  FINDINGS: Right Kidney:  Length: 11.9 cm. Upper limits of normal renal echogenicity noted. A 1.2 x 1.4 cm hypoechoic area within the mid -upper right kidney is indeterminate. There is no evidence of hydronephrosis.  Left Kidney:  Length: 13 cm. Upper limits of normal renal echogenicity noted. A 1.2 x 0.9 x 1.3 cm lower pole cyst is noted. No mass or hydronephrosis visualized.  Bladder:  Appears normal for degree of bladder distention.  IMPRESSION: Upper limits of normal renal  echogenicity bilaterally which can be seen with medical renal disease. No evidence of hydronephrosis.  1.2 x 1.4 cm hypoechoic area within the mid-upper right kidney - unable to characterize further on this ultrasound study. Consider follow-up with elective abdominal MRI with and without contrast.   Electronically Signed   By: Laveda Abbe M.D.   On: 04/14/2014 11:47   ASSESSMENT / PLAN:  PULMONARY A: Acute respiratory failure, hypercapnic and hypoxemic, d/t acute encephalopathy - Required intubation, mech vent Hx COPD, home O2 req - no wheezing on exam Hx BiPAP o/n P:   Initial stat VBG (presunmed arterial not far off)  with resp acidosis / resp failure - pH 7.296, CO2 57.9, O2 28 Intubated, placed on mech vent PRVC, ABg reviewed post, reduce MV, reduce fio2 Stat CXR s/p intubation, central lines - stable, ETT 4-5 cm above carina Albuterol nebs PRN Repeat abg after above  CARDIOVASCULAR A: R/o septic Shock with hypotension refractory to IVF - On pressors Bradycardia - Improved with Atropine Monitor tracing with T-wave elevations, concern hyperkalemia in setting Acute renal failure Elevated lactate - Improved 2.37 >> 1.6 R/o ischemia P:  Monitor BP, MAP goal >65 Continue pressor support with Neo, consider adding Levo dopmaine dc now that brady resolved IVF resuscitation, cvp assessment Repeat lactate Stat Troponin-POC, cycle Troponin-I x 3 Stat EKG, without acute ST-T wave changes Add egdt for now  RENAL A: AKI / ATN, oliguric, likely multifactorial with hypotension, IV contrast , high dose Lasix - Worsening Cr to 9.54 Appears hypovolemic Concern Hyperkalemia - last K 3.4, but clinically had near arrest hyper T, widening qrs AG metabolic acidosis (uremia), secondary metabolic alkaosis, small R/o abdo compartment, clinically abdo soft P:   Repeat labs, CMET, closely monitor K Placed HD Cath, anticipate HD Consult Renal - Discussed with Dr. Briant Cedar, plan for pt to proceed to  CVVHD for now Empiric treatment of HyperK done, repeat labs Sent stat Foley needed Renal US done Assess intra abdo pressure  GASTROINTESTINAL A: SBO, high-grade - Resolved with NGT decompression, last BM 7/21 P:   Assess intra-abdominal pressure via foley cath Stat KUB - no acute findings, air  in colon, suspect continued resolution of SBO Continue Colace ppi lft  HEMATOLOGIC A:   No acute concerns - stable WBC, Hgb plt P:  VTE Prophylaxis - SQ Heparin (held by Renal) as they will use IV for cvvhd  INFECTIOUS A:   Septic Shock, concern abdominal source vs urosepsis, with hypotension refractory to IVF - On pressors E.Coli UTI P:   Continue Vancomycin, Zosyn - dosing per pharm F/u Urine Cx (7/18) for sensitivities Blood cultures x 2 PCT stat baseline, trend per ICU protocol, likely can limit abx  ENDOCRINE A:    DM2, controlled (A1c 7.3, 03/2014) P:   F/u TSH, Cortisol - pending  NEUROLOGIC A: Acute encephalopathy, concern uremia in AKI, increased somnolence despite appropriate orientation Sedation on ventilator P:   Fentanyl gtt sedation RASS Goal -2  GLOBAL: near arrest on arrival, ett, lines, hd , for cvvhd, k pending, may need to change k bath, bolus, neo to MAP goals  CODE Status - Full  04/14/2014, 11:01 PM   I have fully examined this patient and agree with above findings.    And edited in full  Ccm time 90 min in total  Mcarthur Rossetti. [name], MD, FACP Pgr: [phone] [facility]

## 2014-04-14 NOTE — Progress Notes (Signed)
Patient with low BP 81/42. Patient has vomited several times and had several loose stools. Dr. Sharl MaLama notified. New orders to transfer patient to stepdown and to notify Dr. Kristian CoveyBefekadu.

## 2014-04-14 NOTE — Progress Notes (Signed)
04/14/14 1832 Notified Dr Lovell SheehanJenkins of consult for central line placement. Earnstine RegalAshley Pacer Dorn, RN

## 2014-04-14 NOTE — Progress Notes (Signed)
Subjective: Interval History: has no complaint of nausea or vomiting. Patient is still that she's feeling much better and she doesn't have any abdominal pain. Her NG tube is also removed..  Objective: Vital signs in last 24 hours: Temp:  [98.1 F (36.7 C)-101.5 F (38.6 C)] 98.1 F (36.7 C) (07/21 0432) Pulse Rate:  [70-133] 115 (07/21 0432) Resp:  [20] 20 (07/21 0432) BP: (79-100)/(46-57) 79/54 mmHg (07/21 0432) SpO2:  [82 %-91 %] 91 % (07/21 0432) Weight change:   Intake/Output from previous day: 07/20 0701 - 07/21 0700 In: 1610.94667.2 [I.V.:4347.2; IV Piggyback:320] Out: 1150 [Urine:50; Emesis/NG output:1100] Intake/Output this shift:    General appearance: alert, cooperative and no distress Resp: diminished breath sounds bilaterally and wheezes posterior - bilateral Cardio: regular rate and rhythm, S1, S2 normal, no murmur, click, rub or gallop GI: soft, non-tender; bowel sounds normal; no masses,  no organomegaly Extremities: edema 1+ edema bilaterally  Lab Results:  Recent Labs  04/13/14 0539 04/14/14 0557  WBC 5.7 8.2  HGB 15.5* 14.4  HCT 47.1* 44.5  PLT 246 281   BMET:  Recent Labs  04/13/14 0539 04/14/14 0557  NA 140 140  K 3.7 3.5*  CL 76* 75*  CO2 43* 40*  GLUCOSE 174* 202*  BUN 70* 94*  CREATININE 4.63* 8.64*  CALCIUM 10.1 9.5   No results found for this basename: PTH,  in the last 72 hours [place] Studies: No results found for this basename: IRON, TIBC, TRANSFERRIN, FERRITIN,  in the last 72 hours  Studies/Results: Dg Abd 2 Views  04/12/2014   CLINICAL DATA:  Small bowel obstruction.  EXAM: ABDOMEN - 2 VIEW  COMPARISON:  CT topogram on 04/11/2014  FINDINGS: A new nasogastric tube is seen with tip overlying the pylorus or proximal duodenum. Mild decrease in dilated small bowel loops seen since prior study. Multiple surgical clips again seen within the right abdomen and pelvis. No free air identified.  IMPRESSION: Mild decrease in small bowel dilatation.  Nasogastric tube tip overlies the pylorus or proximal duodenum.   Electronically Signed   By: Myles RosenthalJohn  Stahl M.D.   On: 04/12/2014 12:01    I have reviewed the patient's current medications.  Assessment/Pl  Problem #1 acute kidney injury: BUN and creatinine presently is increasing. This could be secondary 2 prerenal syndrome/Dye induced kidney injury/ATN Problem #2 small bowel obstruction: Patient says that she's feeling much better. Presently patient is off NG suction. Problem #3 seizure disorder no seizure activity Problem #4 diabetes Problem #5 obesity Problem #6 sleep apnea: Patient seems to be on BiPAP by history Problem #7 history of bipolar disorder Problem #8 COPD Plan: I have discussed with the patient about initiating dialysis if her  Renal function doesn't improve. Patient presently hesitant. She said she will try  Tomorrow if her renal function doesn't improve. Presently patient does not  require emergency dialysis.Maryclare Labrador. We'll check her basic metabolic panel in the morning We'll continue with IV Lasix and IV fluid. We'll try to put a Foley catheter and do ultrasound of the kidneys to make sure patient doesn't have an obstructive component.   LOS: 3 days   Dailyn Reith in is oS 04/14/2014,8:46 AM

## 2014-04-14 NOTE — Progress Notes (Signed)
Inpatient Diabetes Program Recommendations  AACE/ADA: New Consensus Statement on Inpatient Glycemic Control (2013)  Target Ranges:  Prepandial:   less than 140 mg/dL      Peak postprandial:   less than 180 mg/dL (1-2 hours)      Critically ill patients:  140 - 180 mg/dL   Results for [name], Katrinka F (MRN [id number]) as of 04/14/2014 08:12  Ref. Range 04/13/2014 08:02 04/13/2014 12:21 04/13/2014 17:22 04/14/2014 07:37  Glucose-Capillary Latest Range: 70-99 mg/dL 409202 (H) 811216 (H) 914199 (H) 208 (H)   Diabetes history: DM2 Outpatient Diabetes medications: Lantus 70 units QAM, Glipizide 10 mg BID Current orders for Inpatient glycemic control: Lantus 30 units daily, Novolog 0-15 units Q4H  Inpatient Diabetes Program Recommendations Insulin - Basal: Please consider increasing Lantus to 35 units daily.  Thanks, Orlando PennerMarie Atavia Poppe, RN, MSN, CCRN Diabetes Coordinator Inpatient Diabetes Program [phone] (Team Pager) [phone] ([facility] office) [phone] [facility] office)

## 2014-04-14 NOTE — Progress Notes (Signed)
Dr. Kristian CoveyBefekadu notified, no new orders

## 2014-04-14 NOTE — Progress Notes (Signed)
  Subjective: Patient has had multiple bowel movements over the night. NG tube fell out as per patient. She denies any abdominal pain or nausea.  Objective: Vital signs in last 24 hours: Temp:  [98.1 F (36.7 C)-101.5 F (38.6 C)] 98.1 F (36.7 C) (07/21 0432) Pulse Rate:  [70-133] 115 (07/21 0432) Resp:  [20] 20 (07/21 0432) BP: (79-100)/(46-57) 79/54 mmHg (07/21 0432) SpO2:  [82 %-91 %] 91 % (07/21 0432) Last BM Date:  (unknown)  Intake/Output from previous day: 07/20 0701 - 07/21 0700 In: 1610.94667.2 [I.V.:4347.2; IV Piggyback:320] Out: 1150 [Urine:50; Emesis/NG output:1100] Intake/Output this shift:    General appearance: alert, cooperative and no distress GI: soft, non-tender; bowel sounds normal; no masses,  no organomegaly  Lab Results:   Recent Labs  04/13/14 0539 04/14/14 0557  WBC 5.7 8.2  HGB 15.5* 14.4  HCT 47.1* 44.5  PLT 246 281   BMET  Recent Labs  04/13/14 0539 04/14/14 0557  NA 140 140  K 3.7 3.5*  CL 76* 75*  CO2 43* 40*  GLUCOSE 174* 202*  BUN 70* 94*  CREATININE 4.63* 8.64*  CALCIUM 10.1 9.5   PT/INR No results found for this basename: LABPROT, INR,  in the last 72 hours  Studies/Results: Dg Abd 2 Views  04/12/2014   CLINICAL DATA:  Small bowel obstruction.  EXAM: ABDOMEN - 2 VIEW  COMPARISON:  CT topogram on 04/11/2014  FINDINGS: A new nasogastric tube is seen with tip overlying the pylorus or proximal duodenum. Mild decrease in dilated small bowel loops seen since prior study. Multiple surgical clips again seen within the right abdomen and pelvis. No free air identified.  IMPRESSION: Mild decrease in small bowel dilatation. Nasogastric tube tip overlies the pylorus or proximal duodenum.   Electronically Signed   By: Myles RosenthalJohn  Stahl M.D.   On: 04/12/2014 12:01    Anti-infectives: Anti-infectives   Start     Dose/Rate Route Frequency Ordered Stop   04/12/14 0100  cefTRIAXone (ROCEPHIN) 1 g in dextrose 5 % 50 mL IVPB     1 g 100 mL/hr over  30 Minutes Intravenous Every 24 hours 04/12/14 0042        Assessment/Plan: Impression: Small bowel obstruction, resolving. Plan: Will advance diet as tolerated. Have restarted Colace.  LOS: 3 days    [name] A 04/14/2014

## 2014-04-14 NOTE — Progress Notes (Signed)
Called by nurse to evaluate the patient for persistent hypotension. Patient has been getting IV fluids normal saline at 145 mL per hour along with Lasix drip. Discontinued the Lasix drip and give 1 L of normal saline  bolus. Transfer the patient to ICU, general surgery consulted for IV access central line. Patient also started on Neo-Synephrine drip. Repeat labs show persistent increasing BUN 98 with creatinine 9.53. Patient is mentally alert and oriented x3. Discussed with Dr. Lovell SheehanJenkins, who called and discussed with Dr. Kristian CoveyBefekadu, at this time recommendation is to [facility], as she may not be able to undergo him on dialysis with persistent hypotension. She does have UTI and has been on Rocephin, has been started on vancomycin and Zosyn for possible developing sepsis. Called and discussed with Dr. Kendrick FriesMcquaid, [facility]. Patient will be transferred after IV access is obtained. Also notified patient's son on phone.

## 2014-04-14 NOTE — Progress Notes (Signed)
Called by nurse that patient had several loose stools , vomiting and becoming hypotensive. Patient is currently on normal saline at 145 ml/hr. She has UTI, and is on Rocephin. Urine culture is growing E Coli. Will transfer the patient to step down, and start IV Neosynephrine infusion. Also start vancomycin , zosyn for sepsis. Orders placed in epic.

## 2014-04-14 NOTE — Progress Notes (Signed)
[facility] PROGRESS NOTE  [name] RUE:[id number] DOB: 12/15/1965 DOA: 04/11/2014 PCP: No primary provider on file.  Assessment/Plan: 1. High-grade small bowel obstruction-resolved, patient was started onh NG tube suction, general surgery was  consulted, no surgery recommended at this time. Patient is of NG tube and started on diet per surgery. 2. Anxiety-  Ativan 0.5 mg IV every 6 hours when necessary for anxiety 3. Acute kidney injury- patient's creatinine today is elevated to 2.06- 4.63- 8.64, IV fluids was increased to 125 mL per hour. Possible ? Contrast induced nephropathy, nephrology is following, and plan for hemodialysis in the a.m.  4. Metabolic alkalosis- patient has elevated CO2 40, ABG shows pH 7.534, PCO2 47.9, bicarbonate 40.2. Likely due to dehydration and contraction alkalosis.  5. History of sleep apnea- will order BiPAP at bedtime 6. UTI- patient had abnormal UA and was started on Rocephin, urine culture is growing E coli , >=100,000 colonies/ml. Final sensitivities are pending. 7. History of seizure disorder- continue Depakote, Depakote level 24.2, which is subtherapeutic. Was started on  IV Depakote 500 mg every 12 hours.  Now taking po, will start home dose of Depakote 1500 mg at bedtime. 8. History of bipolar disorder- , will restart medications including abilify, depakote as patient able to take  by mouth. 9. Diabetes mellitus- continue sliding scale insulin, Lantus 35 units subcut daily 10. DVT prophylaxis- heparin  Code Status: Full code Family Communication: *No family at bedside Disposition Plan: Home when stable   Consultants:  *General surgery  Procedures:  None  Antibiotics:  None  HPI/Subjective: 48 year old female with history of bipolar disorder, epilepsy, diabetes mellitus, COPD on home oxygen a mid abdominal pain nausea and vomiting and found to have small bowel obstruction as per the CT scan abdomen. NG tube placed. Patient feels  overall better, but now has worsening renal function. Cr today is 8.64  Objective: Filed Vitals:   04/14/14 0432  BP: 79/54  Pulse: 115  Temp: 98.1 F (36.7 C)  Resp: 20    Intake/Output Summary (Last 24 hours) at 04/14/14 1239 Last data filed at 04/14/14 0900  Gross per 24 hour  Intake 5027.17 ml  Output     50 ml  Net 4977.17 ml   Filed Weights   04/11/14 1707 04/12/14 0044  Weight: 99.791 kg (220 lb) 99.791 kg (220 lb)    Exam:  Physical Exam: Head: Normocephalic, atraumatic.  Lungs: Normal respiratory effort. B/L Clear to auscultation, no crackles or wheezes.  Heart: Regular RR. S1 and S2 normal  Abdomen: BS normoactive. Soft, Nondistended, non-tender.  Extremities: No pretibial edema, no erythema   Data Reviewed: Basic Metabolic Panel:  Recent Labs Lab 04/11/14 1742 04/12/14 0646 04/13/14 0539 04/14/14 0556 04/14/14 0557  NA 136* 139 140  --  140  K 4.1 3.6* 3.7  --  3.5*  CL 87* 82* 76*  --  75*  CO2 34* 40* 43*  --  40*  GLUCOSE 196* 171* 174*  --  202*  BUN 39* 50* 70*  --  94*  CREATININE 1.62* 2.06* 4.63*  --  8.64*  CALCIUM 10.6* 10.2 10.1  --  9.5  PHOS  --   --   --  6.5*  --    Liver Function Tests:  Recent Labs Lab 04/11/14 1742 04/12/14 0646  AST 17 43*  ALT 13 17  ALKPHOS 109 102  BILITOT 0.4 0.3  PROT 7.9 7.6  ALBUMIN 3.8 3.7    Recent Labs Lab  04/11/14 1742  LIPASE 32   No results found for this basename: AMMONIA,  in the last 168 hours CBC:  Recent Labs Lab 04/11/14 1742 04/12/14 0646 04/13/14 0539 04/14/14 0557  WBC 5.2 4.4 5.7 8.2  HGB 14.8 14.7 15.5* 14.4  HCT 44.0 45.1 47.1* 44.5  MCV 84.5 85.4 85.9 85.7  PLT 230 220 246 281   Cardiac Enzymes: No results found for this basename: CKTOTAL, CKMB, CKMBINDEX, TROPONINI,  in the last 168 hours BNP (last 3 results) No results found for this basename: PROBNP,  in the last 8760 hours CBG:  Recent Labs Lab 04/13/14 1221 04/13/14 1722 04/13/14 2137  04/14/14 0737 04/14/14 1208  GLUCAP 216* 199* 206* 208* 228*    Recent Results (from the past 240 hour(s))  URINE CULTURE     Status: None   Collection Time    04/11/14  7:25 PM      Result Value Ref Range Status   Specimen Description URINE, CLEAN CATCH   Final   Special Requests NONE   Final   Culture  Setup Time     Final   Value: 04/12/2014 19:50     Performed at [facility] Count     Final   Value: >=100,000 COLONIES/ML     Performed at [facility]   Culture     Final   Value: ESCHERICHIA COLI     Performed at [facility]   Report Status PENDING   Incomplete     Studies: US Renal  04/14/2014   CLINICAL DATA:  48 year old female with acute renal insufficiency.  EXAM: RENAL/URINARY TRACT ULTRASOUND COMPLETE  COMPARISON:  04/11/2014 and prior CTs dating back to 09/12/2010. 04/11/2007 abdominal ultrasound.  FINDINGS: Right Kidney:  Length: 11.9 cm. Upper limits of normal renal echogenicity noted. A 1.2 x 1.4 cm hypoechoic area within the mid -upper right kidney is indeterminate. There is no evidence of hydronephrosis.  Left Kidney:  Length: 13 cm. Upper limits of normal renal echogenicity noted. A 1.2 x 0.9 x 1.3 cm lower pole cyst is noted. No mass or hydronephrosis visualized.  Bladder:  Appears normal for degree of bladder distention.  IMPRESSION: Upper limits of normal renal echogenicity bilaterally which can be seen with medical renal disease. No evidence of hydronephrosis.  1.2 x 1.4 cm hypoechoic area within the mid-upper right kidney - unable to characterize further on this ultrasound study. Consider follow-up with elective abdominal MRI with and without contrast.   Electronically Signed   By: Laveda Abbe M.D.   On: 04/14/2014 11:47    Scheduled Meds: . antiseptic oral rinse  15 mL Mouth Rinse BID  . cefTRIAXone (ROCEPHIN)  IV  1 g Intravenous Q24H  . docusate sodium  100 mg Oral BID  . furosemide  200 mg Intravenous BID  . heparin  5,000  Units Subcutaneous Q8H  . insulin aspart  0-15 Units Subcutaneous 6 times per day  . insulin glargine  35 Units Subcutaneous Daily  . valproate sodium  500 mg Intravenous Q12H   Continuous Infusions: . sodium chloride 145 mL/hr at 04/14/14 0907    Principal Problem:   SBO (small bowel obstruction) Active Problems:   Bipolar disorder   DM type 2 (diabetes mellitus, type 2)   Seizure disorder   ARF (acute renal failure)    Time spent: 25 minutes    [facility]  [facility] Pager [phone]*. If 7PM-7AM, please contact night-coverage at [email or web address], password [facility]  04/14/2014, 12:39 PM  LOS: 3 days

## 2014-04-14 NOTE — Progress Notes (Signed)
ANTIBIOTIC CONSULT NOTE - INITIAL  Pharmacy Consult for Vancomycin and Zosyn  Indication: rule out sepsis  No Known Allergies  Patient Measurements: Height: 5\' 8"  (172.7 cm) Weight: 220 lb (99.791 kg) IBW/kg (Calculated) : 63.9  Vital Signs: Temp: 99.3 F (37.4 C) (07/21 1802) Temp src: Oral (07/21 1802) BP: 76/34 mmHg (07/21 1802) Pulse Rate: 108 (07/21 1802) Intake/Output from previous day: 07/20 0701 - 07/21 0700 In: 4098.14667.2 [I.V.:4347.2; IV Piggyback:320] Out: 1150 [Urine:50; Emesis/NG output:1100] Intake/Output from this shift: Total I/O In: 600 [P.O.:600] Out: -   Labs:  Recent Labs  04/12/14 0646 04/13/14 0539 04/13/14 1840 04/14/14 0557  WBC 4.4 5.7  --  8.2  HGB 14.7 15.5*  --  14.4  PLT 220 246  --  281  LABCREA  --   --  116.29  --   CREATININE 2.06* 4.63*  --  8.64*   Estimated Creatinine Clearance: 9.7 ml/min (by C-G formula based on Cr of 8.64). No results found for this basename: Rolm GalaVANCOTROUGH, VANCOPEAK, VANCORANDOM, GENTTROUGH, GENTPEAK, GENTRANDOM, TOBRATROUGH, TOBRAPEAK, TOBRARND, AMIKACINPEAK, AMIKACINTROU, AMIKACIN,  in the last 72 hours   Microbiology: Recent Results (from the past 720 hour(s))  URINE CULTURE     Status: None   Collection Time    04/11/14  7:25 PM      Result Value Ref Range Status   Specimen Description URINE, CLEAN CATCH   Final   Special Requests NONE   Final   Culture  Setup Time     Final   Value: 04/12/2014 19:50     [facility]formed [facility]   Colony Count     Final   Value: >=100,000 COLONIES/ML     [facility]formed at [facility]   Culture     Final   Value: ESCHERICHIA COLI     [facility]formed at [facility]   Report Status PENDING   Incomplete    Medical History: Past Medical History  Diagnosis Date  . Diabetes mellitus   . Seizures   . Hemophilia   . Hypertension   . Anxiety   . Panic attacks   . Fear of     falling  . COPD (chronic obstructive pulmonary disease)   . Parkinson's  disease     Medications:  Scheduled:  . antiseptic oral rinse  15 mL Mouth Rinse BID  . [START ON 04/15/2014] ARIPiprazole  30 mg Oral BH-q7a  . cefTRIAXone (ROCEPHIN)  IV  1 g Intravenous Q24H  . clonazePAM  1 mg Oral BID  . divalproex  1,500 mg Oral QHS  . docusate sodium  100 mg Oral BID  . gabapentin  900 mg Oral Q1200  . heparin  5,000 Units Subcutaneous Q8H  . insulin aspart  0-15 Units Subcutaneous 6 times per day  . insulin glargine  35 Units Subcutaneous Daily  . [START ON 04/15/2014] lisinopril  10 mg Oral BH-q7a  . [START ON 04/15/2014] metoprolol succinate  50 mg Oral BH-q7a   Assessment: Okay for Protocol, patient with a Small bowel obstruction, resolving and Acute kidney injury- patient's creatinine today is elevated to 2.06- 4.63- 8.64, and UTI-  started on Rocephin, urine culture is growing E coli >=100,000 colonies/ml. Final sensitivities are pending.  Patient now becoming hypotensive and started on pressors.  Patient to potentially started on HD in am.  Goal of Therapy:  Pre-Hemodialysis Vancomycin level goal range =15-25 mcg/ml Eradicate infection.  Plan:  Vancomycin 1500mg  IV x 1.  Further doses in AM  after HD plans finalized. Zosyn 2.25gm IV every 8 hours. Measure antibiotic drug levels at steady state Follow up culture results  Mady Gemma 04/14/2014,6:22 PM

## 2014-04-15 ENCOUNTER — Inpatient Hospital Stay (HOSPITAL_COMMUNITY): Payer: Medicaid Other

## 2014-04-15 DIAGNOSIS — A419 Sepsis, unspecified organism: Secondary | ICD-10-CM | POA: Insufficient documentation

## 2014-04-15 DIAGNOSIS — N170 Acute kidney failure with tubular necrosis: Secondary | ICD-10-CM

## 2014-04-15 DIAGNOSIS — R6521 Severe sepsis with septic shock: Secondary | ICD-10-CM

## 2014-04-15 DIAGNOSIS — J9601 Acute respiratory failure with hypoxia: Secondary | ICD-10-CM

## 2014-04-15 LAB — BLOOD GAS, ARTERIAL
Acid-Base Excess: 12.7 mmol/L — ABNORMAL HIGH (ref 0.0–2.0)
Acid-Base Excess: 9.4 mmol/L — ABNORMAL HIGH (ref 0.0–2.0)
BICARBONATE: 32.9 meq/L — AB (ref 20.0–24.0)
Bicarbonate: 35.7 mEq/L — ABNORMAL HIGH (ref 20.0–24.0)
DRAWN BY: 10006
Drawn by: 39899
FIO2: 1 %
FIO2: 0.5 %
LHR: 30 {breaths}/min
MECHVT: 520 mL
MECHVT: 520 mL
O2 Saturation: 99.3 %
O2 Saturation: 95.8 %
PATIENT TEMPERATURE: 98.6
PEEP/CPAP: 5 cmH2O
PEEP/CPAP: 5 cmH2O
PH ART: 7.524 — AB (ref 7.350–7.450)
Patient temperature: 98.6
RATE: 14 resp/min
TCO2: 36.7 mmol/L (ref 0–100)
TCO2: 34.1 mmol/L (ref 0–100)
pCO2 arterial: 34.7 mmHg — ABNORMAL LOW (ref 35.0–45.0)
pCO2 arterial: 40.2 mmHg (ref 35.0–45.0)
pH, Arterial: 7.616 (ref 7.350–7.450)
pO2, Arterial: 264 mmHg — ABNORMAL HIGH (ref 80.0–100.0)
pO2, Arterial: 86.4 mmHg (ref 80.0–100.0)

## 2014-04-15 LAB — BASIC METABOLIC PANEL
Anion gap: 16 — ABNORMAL HIGH (ref 5–15)
BUN: 80 mg/dL — ABNORMAL HIGH (ref 6–23)
CO2: 31 mEq/L (ref 19–32)
CREATININE: 5.59 mg/dL — AB (ref 0.50–1.10)
Calcium: 8.5 mg/dL (ref 8.4–10.5)
Chloride: 91 mEq/L — ABNORMAL LOW (ref 96–112)
GFR, EST AFRICAN AMERICAN: 9 mL/min — AB (ref >90)
GFR, EST NON AFRICAN AMERICAN: 8 mL/min — AB (ref >90)
Glucose, Bld: 178 mg/dL — ABNORMAL HIGH (ref 70–99)
Potassium: 4 mEq/L (ref 3.7–5.3)
Sodium: 138 mEq/L (ref 137–147)

## 2014-04-15 LAB — POCT I-STAT 3, ART BLOOD GAS (G3+)
Acid-Base Excess: 11 mmol/L — ABNORMAL HIGH (ref 0.0–2.0)
BICARBONATE: 28.2 meq/L — AB (ref 20.0–24.0)
BICARBONATE: 38.6 meq/L — AB (ref 20.0–24.0)
O2 SAT: 93 %
O2 Saturation: 44 %
PCO2 ART: 65.8 mmHg — AB (ref 35.0–45.0)
PH ART: 7.384 (ref 7.350–7.450)
PO2 ART: 28 mmHg — AB (ref 80.0–100.0)
Patient temperature: 101.5
Patient temperature: 37.2
TCO2: 30 mmol/L (ref 0–100)
TCO2: 40 mmol/L (ref 0–100)
pCO2 arterial: 57.9 mmHg (ref 35.0–45.0)
pH, Arterial: 7.296 — ABNORMAL LOW (ref 7.350–7.450)
pO2, Arterial: 79 mmHg — ABNORMAL LOW (ref 80.0–100.0)

## 2014-04-15 LAB — URINE CULTURE

## 2014-04-15 LAB — RENAL FUNCTION PANEL
Albumin: 2.9 g/dL — ABNORMAL LOW (ref 3.5–5.2)
Anion gap: 23 — ABNORMAL HIGH (ref 5–15)
BUN: 91 mg/dL — ABNORMAL HIGH (ref 6–23)
CHLORIDE: 83 meq/L — AB (ref 96–112)
CO2: 33 meq/L — AB (ref 19–32)
CREATININE: 7.44 mg/dL — AB (ref 0.50–1.10)
Calcium: 8.8 mg/dL (ref 8.4–10.5)
GFR calc non Af Amer: 6 mL/min — ABNORMAL LOW (ref >90)
GFR, EST AFRICAN AMERICAN: 7 mL/min — AB (ref >90)
Glucose, Bld: 193 mg/dL — ABNORMAL HIGH (ref 70–99)
Phosphorus: 5.5 mg/dL — ABNORMAL HIGH (ref 2.3–4.6)
Potassium: 2.7 mEq/L — CL (ref 3.7–5.3)
Sodium: 139 mEq/L (ref 137–147)

## 2014-04-15 LAB — GLUCOSE, CAPILLARY
GLUCOSE-CAPILLARY: 179 mg/dL — AB (ref 70–99)
GLUCOSE-CAPILLARY: 171 mg/dL — AB (ref 70–99)
Glucose-Capillary: 106 mg/dL — ABNORMAL HIGH (ref 70–99)
Glucose-Capillary: 122 mg/dL — ABNORMAL HIGH (ref 70–99)
Glucose-Capillary: 138 mg/dL — ABNORMAL HIGH (ref 70–99)
Glucose-Capillary: 167 mg/dL — ABNORMAL HIGH (ref 70–99)
Glucose-Capillary: 197 mg/dL — ABNORMAL HIGH (ref 70–99)

## 2014-04-15 LAB — COMPREHENSIVE METABOLIC PANEL
ALT: 10 U/L (ref 0–35)
AST: 27 U/L (ref 0–37)
Albumin: 1.9 g/dL — ABNORMAL LOW (ref 3.5–5.2)
Alkaline Phosphatase: 47 U/L (ref 39–117)
Anion gap: 18 — ABNORMAL HIGH (ref 5–15)
BILIRUBIN TOTAL: 0.4 mg/dL (ref 0.3–1.2)
BUN: 74 mg/dL — ABNORMAL HIGH (ref 6–23)
CALCIUM: 6.3 mg/dL — AB (ref 8.4–10.5)
CHLORIDE: 97 meq/L (ref 96–112)
CO2: 26 meq/L (ref 19–32)
CREATININE: 5.92 mg/dL — AB (ref 0.50–1.10)
GFR, EST AFRICAN AMERICAN: 9 mL/min — AB (ref >90)
GFR, EST NON AFRICAN AMERICAN: 8 mL/min — AB (ref >90)
GLUCOSE: 153 mg/dL — AB (ref 70–99)
Potassium: 2.2 mEq/L — CL (ref 3.7–5.3)
Sodium: 141 mEq/L (ref 137–147)
Total Protein: 4.3 g/dL — ABNORMAL LOW (ref 6.0–8.3)

## 2014-04-15 LAB — TROPONIN I
Troponin I: 0.32 ng/mL (ref <0.30)
Troponin I: <0.3 ng/mL (ref <0.30)

## 2014-04-15 LAB — VANCOMYCIN, RANDOM

## 2014-04-15 LAB — CBC
HCT: 39.5 % (ref 36.0–46.0)
Hemoglobin: 13.1 g/dL (ref 12.0–15.0)
MCH: 28.3 pg (ref 26.0–34.0)
MCHC: 33.2 g/dL (ref 30.0–36.0)
MCV: 85.3 fL (ref 78.0–100.0)
Platelets: 211 10*3/uL (ref 150–400)
RBC: 4.63 MIL/uL (ref 3.87–5.11)
RDW: 13.6 % (ref 11.5–15.5)
WBC: 9 10*3/uL (ref 4.0–10.5)

## 2014-04-15 LAB — MAGNESIUM
MAGNESIUM: 1.8 mg/dL (ref 1.5–2.5)
MAGNESIUM: 1.7 mg/dL (ref 1.5–2.5)
Magnesium: 1.8 mg/dL (ref 1.5–2.5)

## 2014-04-15 LAB — MRSA PCR SCREENING: MRSA by PCR: NEGATIVE

## 2014-04-15 LAB — PROTIME-INR
INR: 1.29 (ref 0.00–1.49)
Prothrombin Time: 16.1 seconds — ABNORMAL HIGH (ref 11.6–15.2)

## 2014-04-15 LAB — PHOSPHORUS
Phosphorus: 5 mg/dL — ABNORMAL HIGH (ref 2.3–4.6)
Phosphorus: 3.2 mg/dL (ref 2.3–4.6)

## 2014-04-15 LAB — TSH: TSH: 0.935 u[IU]/mL (ref 0.350–4.500)

## 2014-04-15 LAB — PROCALCITONIN
PROCALCITONIN: 2.93 ng/mL
Procalcitonin: 3.78 ng/mL

## 2014-04-15 LAB — VALPROIC ACID LEVEL: VALPROIC ACID LVL: 22.6 ug/mL — AB (ref 50.0–100.0)

## 2014-04-15 LAB — LACTIC ACID, PLASMA: Lactic Acid, Venous: 1.2 mmol/L (ref 0.5–2.2)

## 2014-04-15 LAB — APTT: APTT: 31 s (ref 24–37)

## 2014-04-15 MED ORDER — HEPARIN (PORCINE) IN NACL 100-0.45 UNIT/ML-% IJ SOLN
INTRAMUSCULAR | Status: AC
  Filled 2014-04-15: qty 250

## 2014-04-15 MED ORDER — HEPARIN SODIUM (PORCINE) 5000 UNIT/ML IJ SOLN
5000.0000 [IU] | Freq: Three times a day (TID) | INTRAMUSCULAR | Status: DC
  Administered 2014-04-15 – 2014-04-20 (×15): 5000 [IU] via SUBCUTANEOUS
  Filled 2014-04-15 (×19): qty 1

## 2014-04-15 MED ORDER — NOREPINEPHRINE BITARTRATE 1 MG/ML IV SOLN
2.0000 ug/min | INTRAVENOUS | Status: DC
  Administered 2014-04-15: 5 ug/min via INTRAVENOUS
  Filled 2014-04-15 (×2): qty 4

## 2014-04-15 MED ORDER — SODIUM CHLORIDE 0.9 % IJ SOLN
250.0000 [IU]/h | INTRAMUSCULAR | Status: DC
  Filled 2014-04-15: qty 2

## 2014-04-15 MED ORDER — DOPAMINE-DEXTROSE 3.2-5 MG/ML-% IV SOLN
INTRAVENOUS | Status: AC
  Administered 2014-04-15: 800 mg
  Filled 2014-04-15: qty 250

## 2014-04-15 MED ORDER — PRO-STAT SUGAR FREE PO LIQD
30.0000 mL | Freq: Two times a day (BID) | ORAL | Status: AC
  Administered 2014-04-15: 11:00:00
  Administered 2014-04-15: 30 mL
  Filled 2014-04-15 (×2): qty 30

## 2014-04-15 MED ORDER — SODIUM BICARBONATE 8.4 % IV SOLN
INTRAVENOUS | Status: DC
  Filled 2014-04-15: qty 850

## 2014-04-15 MED ORDER — BIOTENE DRY MOUTH MT LIQD
15.0000 mL | Freq: Two times a day (BID) | OROMUCOSAL | Status: DC
  Administered 2014-04-15 – 2014-04-16 (×4): 15 mL via OROMUCOSAL

## 2014-04-15 MED ORDER — VANCOMYCIN HCL 10 G IV SOLR
2000.0000 mg | INTRAVENOUS | Status: DC
  Administered 2014-04-16: 2000 mg via INTRAVENOUS
  Filled 2014-04-15: qty 2000

## 2014-04-15 MED ORDER — PRISMASOL BGK 0/2.5 32-2.5 MEQ/L IV SOLN
INTRAVENOUS | Status: DC
  Administered 2014-04-15: 03:00:00 via INTRAVENOUS_CENTRAL
  Filled 2014-04-15 (×4): qty 5000

## 2014-04-15 MED ORDER — VITAL HIGH PROTEIN PO LIQD
1000.0000 mL | ORAL | Status: DC
  Filled 2014-04-15 (×3): qty 1000

## 2014-04-15 MED ORDER — ETOMIDATE 2 MG/ML IV SOLN
0.3000 mg/kg | Freq: Once | INTRAVENOUS | Status: AC
  Administered 2014-04-15: 42.12 mg via INTRAVENOUS

## 2014-04-15 MED ORDER — MIDAZOLAM HCL 2 MG/2ML IJ SOLN
INTRAMUSCULAR | Status: AC
  Administered 2014-04-15: 2 mg
  Filled 2014-04-15: qty 2

## 2014-04-15 MED ORDER — HEPARIN (PORCINE) 2000 UNITS/L FOR CRRT
INTRAVENOUS_CENTRAL | Status: DC | PRN
  Filled 2014-04-15: qty 1000

## 2014-04-15 MED ORDER — FENTANYL CITRATE 0.05 MG/ML IJ SOLN
INTRAMUSCULAR | Status: AC
  Administered 2014-04-15: 01:00:00
  Filled 2014-04-15: qty 2

## 2014-04-15 MED ORDER — FENTANYL CITRATE 0.05 MG/ML IJ SOLN
INTRAMUSCULAR | Status: AC
  Administered 2014-04-15: 100 ug
  Filled 2014-04-15: qty 2

## 2014-04-15 MED ORDER — HEPARIN BOLUS VIA INFUSION (CRRT)
1000.0000 [IU] | INTRAVENOUS | Status: DC | PRN
  Filled 2014-04-15: qty 1000

## 2014-04-15 MED ORDER — HEPARIN SODIUM (PORCINE) 1000 UNIT/ML DIALYSIS
1000.0000 [IU] | INTRAMUSCULAR | Status: DC | PRN
  Administered 2014-04-15: 2400 [IU] via INTRAVENOUS_CENTRAL
  Filled 2014-04-15: qty 1
  Filled 2014-04-15: qty 2
  Filled 2014-04-15: qty 6

## 2014-04-15 MED ORDER — POTASSIUM CHLORIDE 20 MEQ/15ML (10%) PO LIQD
40.0000 meq | Freq: Once | ORAL | Status: AC
  Administered 2014-04-15: 40 meq
  Filled 2014-04-15: qty 30

## 2014-04-15 MED ORDER — ATROPINE SULFATE 0.1 MG/ML IJ SOLN
1.0000 mg | Freq: Once | INTRAMUSCULAR | Status: AC
  Administered 2014-04-15: 1 mg via INTRAVENOUS
  Filled 2014-04-15: qty 10

## 2014-04-15 MED ORDER — DEXTROSE 50 % IV SOLN
INTRAVENOUS | Status: AC
  Administered 2014-04-15: 50 mL
  Filled 2014-04-15: qty 50

## 2014-04-15 MED ORDER — VITAL HIGH PROTEIN PO LIQD
1000.0000 mL | ORAL | Status: DC
  Administered 2014-04-15: 1000 mL
  Filled 2014-04-15 (×4): qty 1000

## 2014-04-15 MED ORDER — SODIUM BICARBONATE 8.4 % IV SOLN: 50.0000 meq | Freq: Once | INTRAVENOUS | Status: DC

## 2014-04-15 MED ORDER — PRISMASOL BGK 0/2.5 32-2.5 MEQ/L IV SOLN
INTRAVENOUS | Status: DC
  Administered 2014-04-15: 03:00:00 via INTRAVENOUS_CENTRAL
  Filled 2014-04-15: qty 5000

## 2014-04-15 MED ORDER — PANTOPRAZOLE SODIUM 40 MG IV SOLR
40.0000 mg | Freq: Every day | INTRAVENOUS | Status: DC
  Administered 2014-04-15 (×2): 40 mg via INTRAVENOUS
  Filled 2014-04-15 (×3): qty 40

## 2014-04-15 MED ORDER — DEXTROSE 50 % IV SOLN
INTRAVENOUS | Status: AC
  Filled 2014-04-15: qty 50

## 2014-04-15 MED ORDER — FENTANYL CITRATE 0.05 MG/ML IJ SOLN
50.0000 ug | Freq: Once | INTRAMUSCULAR | Status: DC
Start: 2014-04-15 — End: 2014-04-17

## 2014-04-15 MED ORDER — SODIUM CHLORIDE 0.9 % IV SOLN
100.0000 ug/h | INTRAVENOUS | Status: DC
  Administered 2014-04-15: 400 ug/h via INTRAVENOUS
  Administered 2014-04-15: 100 ug/h via INTRAVENOUS
  Filled 2014-04-15 (×5): qty 50

## 2014-04-15 MED ORDER — PRISMASOL BGK 0/2.5 32-2.5 MEQ/L IV SOLN
INTRAVENOUS | Status: DC
  Administered 2014-04-15: 03:00:00 via INTRAVENOUS_CENTRAL
  Filled 2014-04-15 (×2): qty 5000

## 2014-04-15 MED ORDER — SODIUM CHLORIDE 0.9 % IV BOLUS (SEPSIS)
1000.0000 mL | Freq: Once | INTRAVENOUS | Status: AC
  Administered 2014-04-15: 1000 mL via INTRAVENOUS

## 2014-04-15 MED ORDER — CALCIUM CHLORIDE 10 % IV SOLN
1.0000 g | Freq: Once | INTRAVENOUS | Status: AC
  Administered 2014-04-15: 1 g via INTRAVENOUS
  Filled 2014-04-15: qty 10

## 2014-04-15 MED ORDER — POTASSIUM CHLORIDE 10 MEQ/100ML IV SOLN
10.0000 meq | INTRAVENOUS | Status: AC
  Administered 2014-04-15 (×4): 10 meq via INTRAVENOUS
  Filled 2014-04-15 (×2): qty 100

## 2014-04-15 MED ORDER — CHLORHEXIDINE GLUCONATE 0.12 % MT SOLN
15.0000 mL | Freq: Two times a day (BID) | OROMUCOSAL | Status: DC
  Administered 2014-04-15 – 2014-04-16 (×4): 15 mL via OROMUCOSAL
  Filled 2014-04-15 (×4): qty 15

## 2014-04-15 NOTE — Clinical Social Work Note (Signed)
CSW reviewed chart, patient was requesting help w contacting [facility] to change appointment.  As patient has transferred hospitals, oncoming CSW can contact agency to reschedule patient appointment at discharge if desired.  CSW signing off at this time as [facility]  Santa GeneraAnne Lysander Calixte, LCSW Clinical Social Worker [phone])

## 2014-04-15 NOTE — Progress Notes (Signed)
INITIAL NUTRITION ASSESSMENT  DOCUMENTATION CODES Per approved criteria  -Morbid Obesity   INTERVENTION:  Utilize 24M PEPuP Protocol: initiate TF via OGT with Vital High Protein at 25 ml/h and Prostat 30 ml BID on day 1; on day 2, increase to goal rate of 70 ml/h (1680 ml per day) without Prostat to provide 1680 kcals (68% of estimated needs), 147 gm protein, 1404 ml free water daily.  Diet clarification, NPO while intubated.  NUTRITION DIAGNOSIS: Inadequate oral intake related to inability to eat as evidenced by NPO status.   Goal: Enteral nutrition to provide 60-70% of estimated calorie needs (22-25 kcals/kg ideal body weight) and 100% of estimated protein needs, based on ASPEN guidelines for permissive underfeeding in critically ill obese individuals  Monitor:  TF tolerance/adequacy, weight trend, labs, vent status.  Reason for Assessment: MD Consult for TF initiation and management.  48 y.o. female  Admitting Dx: Hypotension, worsening acute renal failure, recent SBO  ASSESSMENT: 48 yo F with PMH baseline MR, DM2, bipolar, anxiety, seizure disorder, HTN, COPD (home O2) who [facility] to [facility] ED on 04/11/14 for abdominal pain, n/v, CT abd with high-grade SBO (likely due to hx adhesions) resolved with NGT had BMs on 7/21 and diet was advanced. Also found to have E. Coli UTI. [facility] with worsening renal function and persistent hypotension.  Nutrition focused physical exam completed.  No muscle or subcutaneous fat depletion noticed. Received MD Consult for TF initiation and management. Discussed patient in ICU rounds today.  Patient is currently intubated on ventilator support MV: 7.2 L/min Temp (24hrs), Avg:99.4 F (37.4 C), Min:97.8 F (36.6 C), Max:101.5 F (38.6 C)  Propofol: none  Height: Ht Readings from Last 1 Encounters:  04/14/14 5\' 8"  (1.727 m)    Weight: Wt Readings from Last 1 Encounters:  04/15/14 309 lb 8.4 oz (140.4 kg)     Ideal Body Weight: 63.6 kg  % Ideal Body Weight: 221%  Wt Readings from Last 10 Encounters:  04/15/14 309 lb 8.4 oz (140.4 kg)  03/13/12 240 lb (108.863 kg)  09/20/11 240 lb (108.863 kg)  06/23/11 250 lb (113.399 kg)  07/13/08 306 lb (138.801 kg)    Usual Body Weight: 240 lb 2 years ago  % Usual Body Weight: 129%  BMI:  Body mass index is 47.07 kg/(m^2). class 3, extreme/morbid obesity  Estimated Nutritional Needs: Kcal: 2454 Protein: 140-160 gm Fluid: 2.4 L  Skin: WDL  Diet Order: Clear Liquid  EDUCATION NEEDS: -Education not appropriate at this time   Intake/Output Summary (Last 24 hours) at 04/15/14 1333 Last data filed at 04/15/14 1247  Gross per 24 hour  Intake 1832.71 ml  Output   2968 ml  Net -1135.29 ml    Last BM: 7/22  Labs:   Recent Labs Lab 04/14/14 0556  04/14/14 1920 04/14/14 2359 04/15/14 0238 04/15/14 0239 04/15/14 1011  NA  --   < > 136* 141 139  --   --   K  --   < > 3.4* 2.2* 2.7*  --   --   CL  --   < > 80* 97 83*  --   --   CO2  --   < > 35* 26 33*  --   --   BUN  --   < > 98* 74* 91*  --   --   CREATININE  --   < > 9.53* 5.92* 7.44*  --   --   CALCIUM  --   < >  8.2* 6.3* 8.8  --   --   MG  --   --   --   --   --  1.8 1.8  PHOS 6.5*  --   --   --  5.5*  --  5.0*  GLUCOSE  --   < > 188* 153* 193*  --   --   < > = values in this interval not displayed.  CBG (last 3)   Recent Labs  04/15/14 0357 04/15/14 0802 04/15/14 1229  GLUCAP 179* 167* 138*    Scheduled Meds: . antiseptic oral rinse  15 mL Mouth Rinse q12n4p  . ARIPiprazole  30 mg Oral BH-q7a  . chlorhexidine  15 mL Mouth Rinse BID  . clonazePAM  1 mg Oral BID  . dextrose      . divalproex  1,500 mg Oral QHS  . docusate sodium  100 mg Oral BID  . feeding supplement (PRO-STAT SUGAR FREE 64)  30 mL Per Tube BID  . feeding supplement (VITAL HIGH PROTEIN)  1,000 mL Per Tube Q24H  . fentaNYL  50 mcg Intravenous Once  . heparin subcutaneous  5,000 Units  Subcutaneous 3 times per day  . insulin aspart  0-15 Units Subcutaneous 6 times per day  . pantoprazole (PROTONIX) IV  40 mg Intravenous QHS  . piperacillin-tazobactam (ZOSYN)  IV  2.25 g Intravenous Q8H  . sodium bicarbonate  50 mEq Intravenous Once  . sodium bicarbonate  50 mEq Intravenous Once    Continuous Infusions: . sodium chloride 145 mL/hr at 04/14/14 2042  . fentaNYL infusion INTRAVENOUS 100 mcg/hr (04/15/14 1206)  . norepinephrine (LEVOPHED) Adult infusion 7 mcg/min (04/15/14 1315)  . phenylephrine (NEO-SYNEPHRINE) Adult infusion 180 mcg/min (04/15/14 1247)    Past Medical History  Diagnosis Date  . Diabetes mellitus   . Seizures   . Hemophilia   . Hypertension   . Anxiety   . Panic attacks   . Fear of     falling  . COPD (chronic obstructive pulmonary disease)   . Parkinson's disease     Past Surgical History  Procedure Laterality Date  . Abdominal hysterectomy    . Oophorectomy       Joaquin Courts, RD, LDN, CNSC Pager [phone] After Hours Pager [phone]

## 2014-04-15 NOTE — Progress Notes (Signed)
RT changed patient's ETT holder. RN at bedside. No complications. Vital signs stable. RT will continue to monitor.

## 2014-04-15 NOTE — Progress Notes (Signed)
PULMONARY / CRITICAL CARE MEDICINE   Name: [name] MRN: [id number] DOB: 1965/11/23    ADMISSION DATE:  04/11/2014 CONSULTATION DATE:  04/14/14  REFERRING MD :  Mauro Kaufmann, MD - [facility]) PRIMARY SERVICE: PCCM (transferred from [facility] to [facility])  CHIEF COMPLAINT:  Hypotension, worsening acute renal failure, recent SBO  BRIEF PATIENT DESCRIPTION: 59 yr F with PMH baseline MR, DM2, bipolar, anxiety, seizure disorder, HTN, COPD (home O2) who presented to [facility] ED on 04/11/14 for abdominal pain, n/v, CT abd with high-grade SBO (likely due to hx adhesions) resolved with NGT had BMs on 7/21, advanced diet. Found to have E.Coli UTI and acute worsening Cr to 9.53 in < 72 hrs (b/l ~1), Renal concern for prerenal component, contrast nephropathy vs ATN, on high dose Lasix to inc UOP, currently with minimal UOP. Acute event 7/21 with persistent hypotension, continued IVF, DC'd Lasix, transferred to ICU started on Neo gtt, too unstable for HD, suspect needed CVVH, transferred to [facility] PCCM on 04/14/14.  SIGNIFICANT EVENTS / STUDIES:  7/18 - Admitted [facility]), CT Abd CT with SBO, NGT decompression 7/19 - 7/20 - Increasing Cr 7/21 - Renal US (no hydro, hypoechogenic R-mid-upper kidney, f/u imaging)          - Transfer to [facility] ICU given hypotension with AKI, inc Cr to 9.5, concern for CRRT 7/22 - Acutely encephalopathic, concern uremic, risk hyperK, required intubation >> mech vent, HD cath         - Emergent Renal consult o/n >> started CRRT (x 45 min), on Dopamine, Neo         - pH 7.2.96 (CO2 57.9) >> corrected to 7.616 (CO2 34.7) >> DC bicarb, adjust vent to reduce ventilation         - improved UOP (1.5L), determined critical K to 2.2, discontinued CRRT  LINES / TUBES: ETT 7/22>>> OGTT 7/22 >>> R-IJ HD Cath, 7/22 >>> R-Femoral Art Line 7/22 >>> PIVs  CULTURES: Urine 7/18 >>> E.Coli UTI (pending s/s) Blood x2, 7/22 >>>  ANTIBIOTICS: Ceftriaxone 7/19 >>>  7/21 Vancomycin 7/21 >>> Zosyn 7/21 >>>  SUBJECTIVE: Overnight required intubation placed on vent, sedated, some improved UOP up to 1.5L, brief period on CRRT, since discontinued. Today significantly improved mental status and easily awakens despite Fentanyl and Versed PRN. Responds appropriately, follows commands, denies pain.  VITAL SIGNS: Temp:  [97.8 F (36.6 C)-99.4 F (37.4 C)] 99.3 F (37.4 C) (07/22 0440) Pulse Rate:  [92-119] 96 (07/22 0326) Resp:  [18-29] 18 (07/22 0326) BP: (76-145)/(34-54) 145/54 mmHg (07/22 0326) SpO2:  [88 %-96 %] 96 % (07/22 0326) FiO2 (%):  [50 %-100 %] 50 % (07/22 0348) Weight:  [309 lb 8.4 oz (140.4 kg)-311 lb (141.069 kg)] 309 lb 8.4 oz (140.4 kg) (07/22 0500) HEMODYNAMICS: CVP:  [12 mmHg] 12 mmHg VENTILATOR SETTINGS: Vent Mode:  [-] PRVC FiO2 (%):  [50 %-100 %] 50 % Set Rate:  [12 bmp-30 bmp] 12 bmp Vt Set:  [480 mL-520 mL] 480 mL PEEP:  [5 cmH20] 5 cmH20 Plateau Pressure:  [18 cmH20-22 cmH20] 18 cmH20 INTAKE / OUTPUT: Intake/Output     07/21 0701 - 07/22 0700 07/22 0701 - 07/23 0700   P.O. 600    I.V. (mL/kg)     IV Piggyback     Total Intake(mL/kg) 600 (4.3)    Urine (mL/kg/hr) 1350 (0.4)    Emesis/NG output     Other 68 (0)    Total Output 1418  Net -818            PHYSICAL EXAMINATION: General: NAD Neuro: drowsy on sedation, awakens easily, alert, oriented, mouths words / appropriate response, follows commands Cardiovascular:  Tachycardic, regular rhythm, no murmurs heard Lungs: Improved bilateral breath sounds, reduced rhonchi. No wheezing. No inc WOB or resp distress Abdomen:  Obese, non-tender, non-distended, improved +active BS Ext: +bilateral LE edema symmetrical some erythema, non-tender, +1 pitting bilateral edema, intact distal pulses Skin:  Warm, dry, no rashes or wounds  LABS:  CBC  Recent Labs Lab 04/14/14 0557 04/14/14 1920 04/15/14 0335  WBC 8.2 7.2 9.0  HGB 14.4 12.2 13.1  HCT 44.5 37.2 39.5  PLT  281 220 211   Coag's  Recent Labs Lab 04/15/14 0140 04/15/14 0239  APTT  --  31  INR 1.29  --    BMET  Recent Labs Lab 04/14/14 1920 04/14/14 2359 04/15/14 0238  NA 136* 141 139  K 3.4* 2.2* 2.7*  CL 80* 97 83*  CO2 35* 26 33*  BUN 98* 74* 91*  CREATININE 9.53* 5.92* 7.44*  GLUCOSE 188* 153* 193*   Electrolytes  Recent Labs Lab 04/14/14 0556  04/14/14 1920 04/14/14 2359 04/15/14 0238 04/15/14 0239  CALCIUM  --   < > 8.2* 6.3* 8.8  --   MG  --   --   --   --   --  1.8  PHOS 6.5*  --   --   --  5.5*  --   < > = values in this interval not displayed. Sepsis Markers  Recent Labs Lab 04/11/14 1752 04/14/14 1920 04/14/14 2341 04/14/14 2359 04/15/14 0239  LATICACIDVEN 2.37* 1.6  --  1.2  --   PROCALCITON  --   --  2.93  --  3.78   ABG  Recent Labs Lab 04/15/14 0012 04/15/14 0150 04/15/14 0315  PHART 7.296* 7.616* 7.524*  PCO2ART 57.9* 34.7* 40.2  PO2ART 28.0* 264.0* 86.4   Liver Enzymes  Recent Labs Lab 04/11/14 1742 04/12/14 0646 04/14/14 2359 04/15/14 0238  AST 17 43* 27  --   ALT 13 17 10   --   ALKPHOS 109 102 47  --   BILITOT 0.4 0.3 0.4  --   ALBUMIN 3.8 3.7 1.9* 2.9*   Cardiac Enzymes  Recent Labs Lab 04/15/14 0134 04/15/14 0330  TROPONINI 0.32* <0.30   Glucose  Recent Labs Lab 04/14/14 1208 04/14/14 1605 04/14/14 2150 04/14/14 2349 04/15/14 0038 04/15/14 0357  GLUCAP 228* 217* 145* 122* 106* 179*    Imaging US Renal  04/14/2014   CLINICAL DATA:  48 year old female with acute renal insufficiency.  EXAM: RENAL/URINARY TRACT ULTRASOUND COMPLETE  COMPARISON:  04/11/2014 and prior CTs dating back to 09/12/2010. 04/11/2007 abdominal ultrasound.  FINDINGS: Right Kidney:  Length: 11.9 cm. Upper limits of normal renal echogenicity noted. A 1.2 x 1.4 cm hypoechoic area within the mid -upper right kidney is indeterminate. There is no evidence of hydronephrosis.  Left Kidney:  Length: 13 cm. Upper limits of normal renal  echogenicity noted. A 1.2 x 0.9 x 1.3 cm lower pole cyst is noted. No mass or hydronephrosis visualized.  Bladder:  Appears normal for degree of bladder distention.  IMPRESSION: Upper limits of normal renal echogenicity bilaterally which can be seen with medical renal disease. No evidence of hydronephrosis.  1.2 x 1.4 cm hypoechoic area within the mid-upper right kidney - unable to characterize further on this ultrasound study. Consider follow-up with elective abdominal MRI with and  without contrast.   Electronically Signed   By: Laveda AbbeJeff  Hu M.D.   On: 04/14/2014 11:47   Dg Chest Port 1 View  04/15/2014   CLINICAL DATA:  Assess endotracheal tube placement and central line placement.  EXAM: PORTABLE CHEST - 1 VIEW  COMPARISON:  Chest radiograph from 04/21/2007  FINDINGS: The patient's endotracheal tube is seen ending 4-5 cm above the carina. A right IJ sheath is seen ending about the proximal SVC.  The lungs are mildly hypoexpanded. Vascular crowding and mild vascular congestion are seen. No definite pleural effusion or pneumothorax is seen.  The cardiomediastinal silhouette is borderline normal in size. No acute osseous abnormalities are identified.  IMPRESSION: 1. Endotracheal tube seen ending 4-5 cm above the carina. 2. Right IJ sheath seen ending about the proximal SVC. 3. Lungs mildly hypoexpanded but grossly clear. Mild vascular congestion seen.   Electronically Signed   By: Roanna RaiderJeffery  Chang M.D.   On: 04/15/2014 01:57   Dg Abd Portable 1v  04/15/2014   CLINICAL DATA:  Orogastric tube placement.  EXAM: PORTABLE ABDOMEN - 1 VIEW  COMPARISON:  Abdominal radiograph performed earlier today at 1:47 a.m.  FINDINGS: The patient's orogastric tube is seen ending either at the pylorus or in the first segment of the duodenum.  The visualized bowel gas pattern is grossly unremarkable. The stomach is partially filled with air. Scattered air-filled loops of small and large bowel are noted. There is no evidence for bowel  obstruction. No free intra-abdominal air is identified, though evaluation for free air is suboptimal on supine views. No acute osseous abnormalities are identified.  IMPRESSION: Orogastric tube seen ending either at the pylorus or in the first segment of the duodenum.   Electronically Signed   By: Roanna RaiderJeffery  Chang M.D.   On: 04/15/2014 04:43   Dg Abd Portable 1v  04/15/2014   CLINICAL DATA:  Abdominal bloating.  EXAM: PORTABLE ABDOMEN - 1 VIEW  COMPARISON:  Abdominal radiograph performed 04/12/2014, and CT of the abdomen and pelvis from 04/11/2014  FINDINGS: The visualized bowel gas pattern is grossly unremarkable, with scattered air filled loops of small and large bowel. The stomach is partially filled with air. No small bowel dilatation is seen to suggest obstruction; previously noted bowel dilatation has resolved. No free intra-abdominal air is identified, though evaluation for free air is limited on a single supine view.  No acute osseous abnormalities are identified. Mild degenerative change is noted along the lower lumbar spine. Postoperative change is noted at the upper pelvis.  IMPRESSION: Unremarkable bowel gas pattern; no free intra-abdominal air seen. Air now seen extending into the colon, suggesting interval resolution of bowel obstruction.   Electronically Signed   By: Roanna RaiderJeffery  Chang M.D.   On: 04/15/2014 02:02   ASSESSMENT / PLAN:  PULMONARY A: Acute respiratory failure, hypercapnic and hypoxemic, d/t acute encephalopathy - On vent Hx COPD, home O2 req - no wheezing on exam Hx OSA P:   Full vent support, plan to wean if able to come off of pressors Repeat ABG improved to 7.524,reduce MV further, repeat abg, then consider SBT Repeat CXR in AM Albuterol nebs PRN  CARDIOVASCULAR A: R/o septic Shock with hypotension refractory to IVF - On pressors Bradycardia - Improved with Atropine Elevated lactate - Improved 2.37 >> 1.6 R/o ischemia - Troponin max 0.32 >> neg < 0.30 P:  Monitor  BP, MAP goal >65 Continue pressor support with Neo, Dopamine (as was brady), resolved, wean dop to off as goal  IVF resuscitation, cvp assessment  RENAL A: AKI / ATN, oliguric, likely multifactorial with hypotension, IV contrast , high dose Lasix - Imp Cr to 7.44 Appears hypovolemic Hypokalemia - to 2.2, improved to 2.7  Concern Hyperkalemia - clinically had near arrest hyper T, widening qrs AG metabolic acidosis (uremia), secondary metabolic alkaosis, small P:   F/u BMET, monitor K after supp Consult Renal - Discontinued CRRT o/n (after 45 min, given worsening HypoK, and improved UOP to 1.5L) Foley needed Saline maintain current rate  GASTROINTESTINAL A: SBO, high-grade - Resolved with NGT decompression, last BM 7/21 R/o abdo compartment, clinically abdo soft - Abd pressure 14, appropriately < 20 P:   KUB - no acute findings, air in colon, suspect continued resolution of SBO Continue Colace SUP: Protonix IV Was eating prior to this, start TF  HEMATOLOGIC A:   No acute concerns - stable WBC, Hgb plt P:  VTE Prophylaxis - SQ Heparin re add  INFECTIOUS A:   R/o Septic Shock, concern abdominal source vs urosepsis, with hypotension refractory to IVF - On pressors E.Coli UTI Pct slight up with crt 9 P:   Continue Vancomycin, Zosyn - dosing per pharm, likely narrow or dc in 1-2 days F/u Urine Cx (7/18) for sensitivities Blood cultures x 2  ENDOCRINE A:    DM2, controlled (A1c 7.3, 03/2014) P:   F/u TSH, Cortisol - pending  NEUROLOGIC A: Acute encephalopathy, concern uremia in AKI, increased somnolence despite appropriate orientation Sedation on ventilator Bipolar dz P:   Fentanyl gtt sedation, Versed PRN RASS Goal -2 Depakote level pending Holding neurontin in arf WUA  GLOBAL: Significantly improved mental status, no longer on CRRT given improved UOP and worsening hypokalemia. Improving Cr, monitor electrolytes, remains on vent, wean pressors and sedation,  continue broad antibiotics, pending cultures, trend PCT. sbt planned likely after abg repeat  CODE Status - Full  Saralyn Pilar, DO [facility], PGY-2  04/15/2014, 7:05 AM  Ccm time 30 min   I have fully examined this patient and agree with above findings.      Mcarthur Rossetti. [name], MD, FACP Pgr: [phone] [place] [facility]

## 2014-04-15 NOTE — Procedures (Signed)
Central Venous Catheter Insertion Procedure Note Casey MannsJanet F Shepherd [id number] [birth year]  Procedure: Insertion of Central Venous Catheter Indications: hd, cvp, shock  Procedure Details Consent: Unable to obtain consent because of emergent medical necessity. Time Out: Verified patient identification, verified procedure, site/side was marked, verified correct patient position, special equipment/implants available, medications/allergies/relevent history reviewed, required imaging and test results available.  Performed  Maximum sterile technique was used including antiseptics, cap, gloves, gown, hand hygiene, mask and sheet. Skin prep: Chlorhexidine; local anesthetic administered A antimicrobial bonded/coated triple lumen catheter was placed in the right internal jugular vein using the Seldinger technique.  Evaluation Blood flow good Complications: No apparent complications Patient did tolerate procedure well. Chest X-ray ordered to verify placement.  CXR: pending.  [name],[name]. 04/15/2014, 2:10 AM  US No family numbers  Casey RossettiDaniel Shepherd. Casey AliasFeinstein, MD, FACP Pgr: [phone] [place] [facility]

## 2014-04-15 NOTE — Procedures (Signed)
Arterial Catheter Insertion Procedure Note [name] [id number] [birth year]  Procedure: Insertion of Arterial Catheter  Indications: Blood pressure monitoring and Frequent blood sampling  Procedure Details Consent: Unable to obtain consent because of emergent medical necessity. Time Out: Verified patient identification, verified procedure, site/side was marked, verified correct patient position, special equipment/implants available, medications/allergies/relevent history reviewed, required imaging and test results available.  Performed  Maximum sterile technique was used including antiseptics, cap, gloves, gown, hand hygiene, mask and sheet. Skin prep: Chlorhexidine; local anesthetic administered 20 gauge catheter was inserted into right femoral artery using the Seldinger technique.  Evaluation Blood flow good; BP tracing good. Complications: No apparent complications.   Casey BucksFEINSTEIN,[name]  Failed radial right, US Get in but no wire go  Fem needed  Mcarthur RossettiDaniel J. Tyson AliasFeinstein, MD, FACP Pgr: [phone] [place] [facility]

## 2014-04-15 NOTE — Progress Notes (Signed)
ANTIBIOTIC CONSULT NOTE - FOLLOW UP  Pharmacy Consult for vancomycin Indication: sepsis  Labs:  Recent Labs  04/13/14 0539 04/13/14 1840 04/14/14 0557 04/14/14 1920 04/14/14 2359 04/15/14 0238 04/15/14 0335 04/15/14 1315  WBC 5.7  --  8.2 7.2  --   --  9.0  --   HGB 15.5*  --  14.4 12.2  --   --  13.1  --   PLT 246  --  281 220  --   --  211  --   LABCREA  --  116.29  --   --   --   --   --   --   CREATININE 4.63*  --  8.64* 9.53* 5.92* 7.44*  --  5.59*   Estimated Creatinine Clearance: 18.2 ml/min (by C-G formula based on Cr of 5.59).  Recent Labs  04/15/14 2130  [facility]     Microbiology: Recent Results (from the past 720 hour(s))  URINE CULTURE     Status: None   Collection Time    04/11/14  7:25 PM      Result Value Ref Range Status   Specimen Description URINE, CLEAN CATCH   Final   Special Requests NONE   Final   Culture  Setup Time     Final   Value: 04/12/2014 19:50     [facility]formed at [facility]   Colony Count     Final   Value: >=100,000 COLONIES/ML     [facility]formed at [facility]   Culture     Final   Value: ESCHERICHIA COLI     [facility]formed at [facility]   Report Status 04/15/2014 FINAL   Final   Organism ID, Bacteria ESCHERICHIA COLI   Final  MRSA PCR SCREENING     Status: None   Collection Time    04/14/14 11:49 PM      Result Value Ref Range Status   MRSA by PCR NEGATIVE  NEGATIVE Final   Comment:            The GeneXpert MRSA Assay (FDA     approved for NASAL specimens     only), is one component of a     comprehensive MRSA colonization     surveillance program. It is not     intended to diagnose MRSA     infection nor to guide or     monitor treatment for     MRSA infections.     Assessment/Plan:  48yo female clearing vancomycin well after inc'd UOP.  Will resume vanc at 2000mg  IV Q48H and monitor CBC, SCr, UOP, Cx, levels prn.  Vernard GamblesVeronda Keilin Gamboa, PharmD, BCPS  04/15/2014,11:27 PM

## 2014-04-15 NOTE — Consult Note (Signed)
[name] is an 48 y.o. female referred by Dr [name]   Chief Complaint: ARF HPI: [phone] WF transferred from [facility] after she presented there on 7/18 with small bowel obstruction.  No known history of renal insufficiency but Scr 1.6 on admission  She was on lisinopril at that time.  On 7/18 she had a contrasted CT of abdomen that showed SBO.  Since then her Scr increased to 9.53 as of yest.  In the last 48hr she also developed hypotension and because of the potential need for CVVHD, she was transferred to [facility].  FeNa on 7/20 was < 1%.  Her Scr on arrival here last night was 5.9 but today it is 7.44. UO since arrival 1.3L.  Renal US 2/21 unremarkable except for ?mass vs cyst in Rt kidney.  Urine culture grew E coli but no blood cultures done until arrival here.  Past Medical History  Diagnosis Date  . Diabetes mellitus   . Seizures   . Hemophilia   . Hypertension   . Anxiety   . Panic attacks   . Fear of     falling  . COPD (chronic obstructive pulmonary disease)   . Parkinson's disease     Past Surgical History  Procedure Laterality Date  . Abdominal hysterectomy    . Oophorectomy      History reviewed. No pertinent family history. Social History:  reports that she has never smoked. She does not have any smokeless tobacco history on file. She reports that she does not drink alcohol or use illicit drugs.  Allergies: No Known Allergies  Medications Prior to Admission  Medication Sig Dispense Refill  . ARIPiprazole (ABILIFY) 30 MG tablet Take 30 mg by mouth every morning.        Marland Kitchen aspirin EC 81 MG tablet Take 81 mg by mouth every morning.        . calcium carbonate (OS-CAL) 600 MG TABS Take 600 mg by mouth 2 (two) times daily with a meal.        . clonazePAM (KLONOPIN) 1 MG tablet Take 1 mg by mouth 2 (two) times daily.       . divalproex (DEPAKOTE) 250 MG DR tablet Take 1,500 mg by mouth at bedtime.       . docusate sodium (COLACE) 100 MG capsule Take 100 mg by mouth 3  (three) times daily.        Marland Kitchen gabapentin (NEURONTIN) 300 MG capsule Take 900 mg by mouth daily at 12 noon.       Marland Kitchen gemfibrozil (LOPID) 600 MG tablet Take 600 mg by mouth 2 (two) times daily before a meal.        . glipiZIDE (GLUCOTROL) 10 MG tablet Take 10 mg by mouth 2 (two) times daily before a meal.        . insulin glargine (LANTUS) 100 UNIT/ML injection Inject 70 Units into the skin every morning.        . isosorbide mononitrate (IMDUR) 60 MG 24 hr tablet Take 60 mg by mouth every morning.        Marland Kitchen lisinopril (PRINIVIL,ZESTRIL) 10 MG tablet Take 10 mg by mouth every morning.        . metoprolol (TOPROL-XL) 50 MG 24 hr tablet Take 50 mg by mouth every morning.       . Multiple Vitamins-Minerals (MULTIVITAMINS THER. W/MINERALS) TABS Take 1 tablet by mouth every morning.        Marland Kitchen oxybutynin (DITROPAN) 5 MG tablet  Take 5 mg by mouth every morning.        . repaglinide (PRANDIN) 0.5 MG tablet Take 0.5 mg by mouth 3 (three) times daily before meals.      . traMADol (ULTRAM) 50 MG tablet Take 50 mg by mouth at bedtime. For pain      . ibuprofen (ADVIL,MOTRIN) 800 MG tablet Take 800 mg by mouth every 8 (eight) hours as needed. For pain           Lab Results: UA: 3-6 WBC many bact , 100mg  protein  Recent Labs  04/14/14 0557 04/14/14 1920 04/15/14 0335  WBC 8.2 7.2 9.0  HGB 14.4 12.2 13.1  HCT 44.5 37.2 39.5  PLT 281 220 211   BMET  Recent Labs  04/14/14 0556  04/14/14 1920 04/14/14 2359 04/15/14 0238  NA  --   < > 136* 141 139  K  --   < > 3.4* 2.2* 2.7*  CL  --   < > 80* 97 83*  CO2  --   < > 35* 26 33*  GLUCOSE  --   < > 188* 153* 193*  BUN  --   < > 98* 74* 91*  CREATININE  --   < > 9.53* 5.92* 7.44*  CALCIUM  --   < > 8.2* 6.3* 8.8  PHOS 6.5*  --   --   --  5.5*  < > = values in this interval not displayed. LFT  Recent Labs  04/14/14 2359 04/15/14 0238  PROT 4.3*  --   ALBUMIN 1.9* 2.9*  AST 27  --   ALT 10  --   ALKPHOS 47  --   BILITOT 0.4  --    [place]  Renal  04/14/2014   CLINICAL DATA:  48 year old female with acute renal insufficiency.  EXAM: RENAL/URINARY TRACT ULTRASOUND COMPLETE  COMPARISON:  04/11/2014 and prior CTs dating back to 09/12/2010. 04/11/2007 abdominal ultrasound.  FINDINGS: Right Kidney:  Length: 11.9 cm. Upper limits of normal renal echogenicity noted. A 1.2 x 1.4 cm hypoechoic area within the mid -upper right kidney is indeterminate. There is no evidence of hydronephrosis.  Left Kidney:  Length: 13 cm. Upper limits of normal renal echogenicity noted. A 1.2 x 0.9 x 1.3 cm lower pole cyst is noted. No mass or hydronephrosis visualized.  Bladder:  Appears normal for degree of bladder distention.  IMPRESSION: Upper limits of normal renal echogenicity bilaterally which can be seen with medical renal disease. No evidence of hydronephrosis.  1.2 x 1.4 cm hypoechoic area within the mid-upper right kidney - unable to characterize further on this ultrasound study. Consider follow-up with elective abdominal MRI with and without contrast.   Electronically Signed   By: Laveda AbbeJeff  Hu M.D.   On: 04/14/2014 11:47   Dg Chest Port 1 View  04/15/2014   CLINICAL DATA:  Assess endotracheal tube placement and central line placement.  EXAM: PORTABLE CHEST - 1 VIEW  COMPARISON:  Chest radiograph from 04/21/2007  FINDINGS: The patient's endotracheal tube is seen ending 4-5 cm above the carina. A right IJ sheath is seen ending about the proximal SVC.  The lungs are mildly hypoexpanded. Vascular crowding and mild vascular congestion are seen. No definite pleural effusion or pneumothorax is seen.  The cardiomediastinal silhouette is borderline normal in size. No acute osseous abnormalities are identified.  IMPRESSION: 1. Endotracheal tube seen ending 4-5 cm above the carina. 2. Right IJ sheath seen ending about the proximal SVC. 3. Lungs  mildly hypoexpanded but grossly clear. Mild vascular congestion seen.   Electronically Signed   By: Roanna Raider M.D.   On: 04/15/2014  01:57   Dg Abd Portable 1v  04/15/2014   CLINICAL DATA:  Orogastric tube placement.  EXAM: PORTABLE ABDOMEN - 1 VIEW  COMPARISON:  Abdominal radiograph performed earlier today at 1:47 a.m.  FINDINGS: The patient's orogastric tube is seen ending either at the pylorus or in the first segment of the duodenum.  The visualized bowel gas pattern is grossly unremarkable. The stomach is partially filled with air. Scattered air-filled loops of small and large bowel are noted. There is no evidence for bowel obstruction. No free intra-abdominal air is identified, though evaluation for free air is suboptimal on supine views. No acute osseous abnormalities are identified.  IMPRESSION: Orogastric tube seen ending either at the pylorus or in the first segment of the duodenum.   Electronically Signed   By: Roanna Raider M.D.   On: 04/15/2014 04:43   Dg Abd Portable 1v  04/15/2014   CLINICAL DATA:  Abdominal bloating.  EXAM: PORTABLE ABDOMEN - 1 VIEW  COMPARISON:  Abdominal radiograph performed 04/12/2014, and CT of the abdomen and pelvis from 04/11/2014  FINDINGS: The visualized bowel gas pattern is grossly unremarkable, with scattered air filled loops of small and large bowel. The stomach is partially filled with air. No small bowel dilatation is seen to suggest obstruction; previously noted bowel dilatation has resolved. No free intra-abdominal air is identified, though evaluation for free air is limited on a single supine view.  No acute osseous abnormalities are identified. Mild degenerative change is noted along the lower lumbar spine. Postoperative change is noted at the upper pelvis.  IMPRESSION: Unremarkable bowel gas pattern; no free intra-abdominal air seen. Air now seen extending into the colon, suggesting interval resolution of bowel obstruction.   Electronically Signed   By: Roanna Raider M.D.   On: 04/15/2014 02:02    ROS: intubated and on sedation.  Able to get limited ROS. No CP No abd pain  PHYSICAL  EXAM: Blood pressure 145/54, pulse 96, temperature 99.3 F (37.4 C), temperature source Oral, resp. rate 18, height 5\' 8"  (1.727 m), weight 141.069 kg (311 lb), SpO2 96.00%. HEENT: PERRLA EOMI NECK:Rt triple lumen HD cath LUNGS:clear ant CARDIAC:RRR 1/6 systolic Murmur ABD:Few BS, obese, NTND no overt HS EXT:no edema NEURO:Moves all ext.  Follows commands  Assessment: 1. ARF secondary to ATN from volume depletion, IV contrast, hypotension in face of ACE inhibitor 2. Hypokalemia 3. Sepsis syndrome 4. SBO, ? Resolved 5. E Coli UTI 6. VDRF PLAN: 1. Replace K 2. Plan was to do CVVHD but will hold off for now as she is making urine and Scr appears to have trended down some.  She is not out of the woods yet and still may require RRT but will follow renal fx closely. 3. Wean pressors as tolerated 4. Recheck BMET this afternoon 5. Recheck renal profile in AM 6. She will need FU imaging of Rt kidney after she recovers   Kiannah Grunow T 04/15/2014, 6:12 AM

## 2014-04-15 NOTE — Procedures (Signed)
Intubation Procedure Note Starling MannsJanet F Croswell [id number] [birth year]  Procedure: Intubation Indications: Respiratory insufficiency  Procedure Details Consent: Unable to obtain consent because of emergent medical necessity. Time Out: Verified patient identification, verified procedure, site/side was marked, verified correct patient position, special equipment/implants available, medications/allergies/relevent history reviewed, required imaging and test results available.  Performed  Maximum sterile technique was used including gloves, gown and hand hygiene.  MAC and 3    Evaluation Hemodynamic Status: Transient hypotension treated with pressors; O2 sats: stable throughout Patient's Current Condition: stable Complications: No apparent complications Patient did tolerate procedure well. Chest X-ray ordered to verify placement.  CXR: pending.   Nelda BucksFEINSTEIN,DANIEL J. 04/15/2014  Brady, unresponsive, shock, agonal

## 2014-04-16 ENCOUNTER — Inpatient Hospital Stay (HOSPITAL_COMMUNITY): Payer: Medicaid Other

## 2014-04-16 LAB — GLUCOSE, CAPILLARY
GLUCOSE-CAPILLARY: 299 mg/dL — AB (ref 70–99)
GLUCOSE-CAPILLARY: 168 mg/dL — AB (ref 70–99)
Glucose-Capillary: 226 mg/dL — ABNORMAL HIGH (ref 70–99)
Glucose-Capillary: 236 mg/dL — ABNORMAL HIGH (ref 70–99)
Glucose-Capillary: 280 mg/dL — ABNORMAL HIGH (ref 70–99)

## 2014-04-16 LAB — CBC
HCT: 35.6 % — ABNORMAL LOW (ref 36.0–46.0)
HEMOGLOBIN: 11.1 g/dL — AB (ref 12.0–15.0)
MCH: 27.9 pg (ref 26.0–34.0)
MCHC: 31.2 g/dL (ref 30.0–36.0)
MCV: 89.4 fL (ref 78.0–100.0)
PLATELETS: 162 10*3/uL (ref 150–400)
RBC: 3.98 MIL/uL (ref 3.87–5.11)
RDW: 13.5 % (ref 11.5–15.5)
WBC: 7.8 10*3/uL (ref 4.0–10.5)

## 2014-04-16 LAB — RENAL FUNCTION PANEL
ALBUMIN: 2.4 g/dL — AB (ref 3.5–5.2)
ANION GAP: 14 (ref 5–15)
BUN: 53 mg/dL — ABNORMAL HIGH (ref 6–23)
CO2: 28 mEq/L (ref 19–32)
Calcium: 8.4 mg/dL (ref 8.4–10.5)
Chloride: 99 mEq/L (ref 96–112)
Creatinine, Ser: 2.5 mg/dL — ABNORMAL HIGH (ref 0.50–1.10)
GFR, EST AFRICAN AMERICAN: 25 mL/min — AB (ref >90)
GFR, EST NON AFRICAN AMERICAN: 21 mL/min — AB (ref >90)
Glucose, Bld: 273 mg/dL — ABNORMAL HIGH (ref 70–99)
PHOSPHORUS: 2.5 mg/dL (ref 2.3–4.6)
POTASSIUM: 4.2 meq/L (ref 3.7–5.3)
SODIUM: 141 meq/L (ref 137–147)

## 2014-04-16 LAB — COMPLEMENT, TOTAL

## 2014-04-16 LAB — MAGNESIUM: Magnesium: 1.5 mg/dL (ref 1.5–2.5)

## 2014-04-16 LAB — CORTISOL-AM, BLOOD: CORTISOL - AM: 38.5 ug/dL — AB (ref 4.3–22.4)

## 2014-04-16 LAB — PHOSPHORUS: Phosphorus: 2.2 mg/dL — ABNORMAL LOW (ref 2.3–4.6)

## 2014-04-16 LAB — PROCALCITONIN: Procalcitonin: 0.75 ng/mL

## 2014-04-16 MED ORDER — PIPERACILLIN-TAZOBACTAM 3.375 G IVPB
3.3750 g | Freq: Three times a day (TID) | INTRAVENOUS | Status: DC
  Administered 2014-04-16 – 2014-04-17 (×3): 3.375 g via INTRAVENOUS
  Filled 2014-04-16 (×5): qty 50

## 2014-04-16 MED ORDER — DOCUSATE SODIUM 50 MG/5ML PO LIQD
100.0000 mg | Freq: Two times a day (BID) | ORAL | Status: DC
  Administered 2014-04-17 – 2014-04-20 (×6): 100 mg via ORAL
  Filled 2014-04-16 (×11): qty 10

## 2014-04-16 MED ORDER — INSULIN GLARGINE 100 UNIT/ML ~~LOC~~ SOLN: 30.0000 [IU] | Freq: Every day | SUBCUTANEOUS | Status: DC

## 2014-04-16 MED ORDER — NOREPINEPHRINE BITARTRATE 1 MG/ML IV SOLN
2.0000 ug/min | INTRAVENOUS | Status: DC
  Administered 2014-04-16: 30 ug/min via INTRAVENOUS
  Filled 2014-04-16 (×2): qty 16

## 2014-04-16 MED ORDER — ACETAMINOPHEN 160 MG/5ML PO SOLN
650.0000 mg | Freq: Four times a day (QID) | ORAL | Status: DC | PRN
  Administered 2014-04-16: 650 mg via ORAL
  Filled 2014-04-16: qty 20.3

## 2014-04-16 MED ORDER — INSULIN GLARGINE 100 UNIT/ML ~~LOC~~ SOLN
35.0000 [IU] | Freq: Every day | SUBCUTANEOUS | Status: DC
  Administered 2014-04-16 – 2014-04-17 (×2): 35 [IU] via SUBCUTANEOUS
  Filled 2014-04-16 (×2): qty 0.35

## 2014-04-16 MED ORDER — PANTOPRAZOLE SODIUM 40 MG PO PACK
40.0000 mg | PACK | Freq: Every day | ORAL | Status: DC
  Administered 2014-04-16: 40 mg
  Filled 2014-04-16 (×2): qty 20

## 2014-04-16 NOTE — Progress Notes (Signed)
PHARMACIST - PHYSICIAN COMMUNICATION DR:   Tyson AliasFeinstein CONCERNING: Protonix IV to PT Route Change Policy  RECOMMENDATION: This patient is receiving Protonix by the intravenous route.  Based on criteria approved by the Pharmacy and Therapeutics Committee, this drug is being converted to the equivalent oral dose form(s).  DESCRIPTION: These criteria include:  The patient is eating (either orally or via tube) and/or has been taking other orally administered medications for a least 24 hours  There is no active GI bleed or impaired GI absorption noted.   If you have questions about this conversion, please contact the Pharmacy Department  []   [phone]( [phone] )  [facility] [x]   [phone]( [phone] )  [facility]  []   [phone]( [phone] )  [facility] []   [phone]( [phone] )  [facility]

## 2014-04-16 NOTE — Progress Notes (Signed)
[facility] Progress Note    Assessment:  1. ARF secondary to ATN from volume depletion, IV contrast, hypotension in face of ACE inhibitor 2. Hypokalemia 3. Sepsis syndrome 4. SBO, ? Resolved 5. E Coli UTI 6. VDRF PLAN:  1. Renal function is markedly improved consistent with prerenal azotemia. 2. Wean pressors as tolerated 3. She will need FU imaging of Rt kidney after she recovers; can perform as an outpatient. 4.   Very good urine output and will sign off at this time.   Subjective:   No events overnight. Patient remains on Levophed and is tachycardic.   Objective:   BP 95/38  Pulse 109  Temp(Src) 102.4 F (39.1 C) (Oral)  Resp 30  Ht 5\' 8"  (1.727 m)  Wt 140.1 kg (308 lb 13.8 oz)  BMI 46.97 kg/m2  SpO2 79%  Intake/Output Summary (Last 24 hours) at 04/16/14 0810 Last data filed at 04/16/14 0700  Gross per 24 hour  Intake 3027.86 ml  Output   4700 ml  Net -1672.14 ml   Weight change: -0.969 kg (-2 lb 2.2 oz)  Physical Exam: HEENT: PERRLA EOMI  NECK:Rt triple lumen HD cath  LUNGS:clear ant  CARDIAC:RRR 1/6 systolic Murmur  ABD:Few BS, obese, NTND no overt HS  EXT:no edema     Imaging: [place] Renal  04/14/2014   CLINICAL DATA:  48 year old female with acute renal insufficiency.  EXAM: RENAL/URINARY TRACT ULTRASOUND COMPLETE  COMPARISON:  04/11/2014 and prior CTs dating back to 09/12/2010. 04/11/2007 abdominal ultrasound.  FINDINGS: Right Kidney:  Length: 11.9 cm. Upper limits of normal renal echogenicity noted. A 1.2 x 1.4 cm hypoechoic area within the mid -upper right kidney is indeterminate. There is no evidence of hydronephrosis.  Left Kidney:  Length: 13 cm. Upper limits of normal renal echogenicity noted. A 1.2 x 0.9 x 1.3 cm lower pole cyst is noted. No mass or hydronephrosis visualized.  Bladder:  Appears normal for degree of bladder distention.  IMPRESSION: Upper limits of normal renal echogenicity bilaterally which can be seen with medical  renal disease. No evidence of hydronephrosis.  1.2 x 1.4 cm hypoechoic area within the mid-upper right kidney - unable to characterize further on this ultrasound study. Consider follow-up with elective abdominal MRI with and without contrast.   Electronically Signed   By: Laveda AbbeJeff  Hu M.D.   On: 04/14/2014 11:47   Dg Chest Port 1 View  04/16/2014   CLINICAL DATA:  Intubation.  EXAM: PORTABLE CHEST - 1 VIEW  COMPARISON:  04/15/2014.  FINDINGS: Endotracheal tube and right IJ line in stable position. NG tube noted with tip below the left hemidiaphragm. Cardiomegaly. Bilateral pulmonary alveolar infiltrates. Right base atelectasis. No pneumothorax. No acute osseus abnormality.  IMPRESSION: 1. Interim placement of NG tube, its tip is below the hemidiaphragm. Endotracheal tube and right IJ line in stable position. 2. Development of new bilateral pulmonary alveolar infiltrates. New onset right base atelectasis. 3. Cardiomegaly. Component of congestive heart failure may be present.   Electronically Signed   By: Maisie Fushomas  Register   On: 04/16/2014 07:28   Dg Chest Port 1 View  04/15/2014   CLINICAL DATA:  Assess endotracheal tube placement and central line placement.  EXAM: PORTABLE CHEST - 1 VIEW  COMPARISON:  Chest radiograph from 04/21/2007  FINDINGS: The patient's endotracheal tube is seen ending 4-5 cm above the carina. A right IJ sheath is seen ending about the proximal SVC.  The lungs are mildly hypoexpanded. Vascular crowding and mild vascular congestion  are seen. No definite pleural effusion or pneumothorax is seen.  The cardiomediastinal silhouette is borderline normal in size. No acute osseous abnormalities are identified.  IMPRESSION: 1. Endotracheal tube seen ending 4-5 cm above the carina. 2. Right IJ sheath seen ending about the proximal SVC. 3. Lungs mildly hypoexpanded but grossly clear. Mild vascular congestion seen.   Electronically Signed   By: Roanna Raider M.D.   On: 04/15/2014 01:57   Dg Abd  Portable 1v  04/15/2014   CLINICAL DATA:  Orogastric tube placement.  EXAM: PORTABLE ABDOMEN - 1 VIEW  COMPARISON:  Abdominal radiograph performed earlier today at 1:47 a.m.  FINDINGS: The patient's orogastric tube is seen ending either at the pylorus or in the first segment of the duodenum.  The visualized bowel gas pattern is grossly unremarkable. The stomach is partially filled with air. Scattered air-filled loops of small and large bowel are noted. There is no evidence for bowel obstruction. No free intra-abdominal air is identified, though evaluation for free air is suboptimal on supine views. No acute osseous abnormalities are identified.  IMPRESSION: Orogastric tube seen ending either at the pylorus or in the first segment of the duodenum.   Electronically Signed   By: Roanna Raider M.D.   On: 04/15/2014 04:43   Dg Abd Portable 1v  04/15/2014   CLINICAL DATA:  Abdominal bloating.  EXAM: PORTABLE ABDOMEN - 1 VIEW  COMPARISON:  Abdominal radiograph performed 04/12/2014, and CT of the abdomen and pelvis from 04/11/2014  FINDINGS: The visualized bowel gas pattern is grossly unremarkable, with scattered air filled loops of small and large bowel. The stomach is partially filled with air. No small bowel dilatation is seen to suggest obstruction; previously noted bowel dilatation has resolved. No free intra-abdominal air is identified, though evaluation for free air is limited on a single supine view.  No acute osseous abnormalities are identified. Mild degenerative change is noted along the lower lumbar spine. Postoperative change is noted at the upper pelvis.  IMPRESSION: Unremarkable bowel gas pattern; no free intra-abdominal air seen. Air now seen extending into the colon, suggesting interval resolution of bowel obstruction.   Electronically Signed   By: Roanna Raider M.D.   On: 04/15/2014 02:02    Labs: BMET  Recent Labs Lab 04/13/14 0539 04/14/14 0556 04/14/14 0557 04/14/14 1920 04/14/14 2359  04/15/14 0238 04/15/14 1011 04/15/14 1315 04/15/14 2136 04/16/14 0500  NA 140  --  140 136* 141 139  --  138  --  141  K 3.7  --  3.5* 3.4* 2.2* 2.7*  --  4.0  --  4.2  CL 76*  --  75* 80* 97 83*  --  91*  --  99  CO2 43*  --  40* 35* 26 33*  --  31  --  28  GLUCOSE 174*  --  202* 188* 153* 193*  --  178*  --  273*  BUN 70*  --  94* 98* 74* 91*  --  80*  --  53*  CREATININE 4.63*  --  8.64* 9.53* 5.92* 7.44*  --  5.59*  --  2.50*  CALCIUM 10.1  --  9.5 8.2* 6.3* 8.8  --  8.5  --  8.4  PHOS  --  6.5*  --   --   --  5.5* 5.0*  --  3.2 2.5   CBC  Recent Labs Lab 04/14/14 0557 04/14/14 1920 04/15/14 0335 04/16/14 0500  WBC 8.2 7.2 9.0 7.8  HGB 14.4 12.2 13.1 11.1*  HCT 44.5 37.2 39.5 35.6*  MCV 85.7 84.7 85.3 89.4  PLT 281 220 211 162    Medications:    . antiseptic oral rinse  15 mL Mouth Rinse q12n4p  . ARIPiprazole  30 mg Oral BH-q7a  . chlorhexidine  15 mL Mouth Rinse BID  . clonazePAM  1 mg Oral BID  . divalproex  1,500 mg Oral QHS  . docusate sodium  100 mg Oral BID  . fentaNYL  50 mcg Intravenous Once  . heparin subcutaneous  5,000 Units Subcutaneous 3 times per day  . insulin aspart  0-15 Units Subcutaneous 6 times per day  . pantoprazole (PROTONIX) IV  40 mg Intravenous QHS  . piperacillin-tazobactam (ZOSYN)  IV  2.25 g Intravenous Q8H  . sodium bicarbonate  50 mEq Intravenous Once  . sodium bicarbonate  50 mEq Intravenous Once  . vancomycin  2,000 mg Intravenous Q48H      Paulene Floor, MD 04/16/2014, 8:10 AM

## 2014-04-16 NOTE — Progress Notes (Signed)
eLink Physician-Brief Progress Note Patient Name: [name] DOB: [birth year] MRN: [id number]  Date of Service  04/16/2014   HPI/Events of Note   Extubated, doing well, hungry, sbo resolved and tolerated tube feedings on vent fever  eICU Interventions  Remove HD cath and a-line APAP now clears   Intervention Category Intermediate Interventions: Infection - evaluation and management;Communication with other healthcare providers and/or family  Max FickleMCQUAID, [name] 04/16/2014, 4:14 PM

## 2014-04-16 NOTE — Progress Notes (Signed)
ANTIBIOTIC CONSULT NOTE - Follow Up  Pharmacy Consult for Zosyn  Indication: rule out sepsis, E. Coli UTI  No Known Allergies  Patient Measurements: Height: 5\' 8"  (172.7 cm) Weight: 308 lb 13.8 oz (140.1 kg) IBW/kg (Calculated) : 63.9  Vital Signs: Temp: 102 F (38.9 C) (07/23 1226) Temp src: Oral (07/23 1226) BP: 119/54 mmHg (07/23 0907) Pulse Rate: 105 (07/23 1215) Intake/Output from previous day: 07/22 0701 - 07/23 0700 In: 3635.9 [I.V.:2771.5; NG/GT:714.3; IV Piggyback:150] Out: 4700 [Urine:4700] Intake/Output from this shift: Total I/O In: 415.1 [I.V.:180.1; NG/GT:210; IV Piggyback:25] Out: 1300 [Urine:1300]  Labs:  Recent Labs  04/13/14 1840  04/14/14 1920  04/15/14 0238 04/15/14 0335 04/15/14 1315 04/16/14 0500  WBC  --   < > 7.2  --   --  9.0  --  7.8  HGB  --   < > 12.2  --   --  13.1  --  11.1*  PLT  --   < > 220  --   --  211  --  162  LABCREA 116.29  --   --   --   --   --   --   --   CREATININE  --   < > 9.53*  < > 7.44*  --  5.59* 2.50*  < > = values in this interval not displayed. Estimated Creatinine Clearance: 40.6 ml/min (by C-G formula based on Cr of 2.5).  Recent Labs  04/15/14 2130  [facility]     Microbiology: Recent Results (from the past 720 hour(s))  URINE CULTURE     Status: None   Collection Time    04/11/14  7:25 PM      Result Value Ref Range Status   Specimen Description URINE, CLEAN CATCH   Final   Special Requests NONE   Final   Culture  Setup Time     Final   Value: 04/12/2014 19:50     [facility]formed at [facility]   Colony Count     Final   Value: >=100,000 COLONIES/ML     [facility]   Culture     Final   Value: ESCHERICHIA COLI     [facility]formed at [facility]   Report Status 04/15/2014 FINAL   Final   Organism ID, Bacteria ESCHERICHIA COLI   Final  MRSA PCR SCREENING     Status: None   Collection Time    04/14/14 11:49 PM      Result Value Ref Range Status   MRSA by PCR  NEGATIVE  NEGATIVE Final   Comment:            The GeneXpert MRSA Assay (FDA     approved for NASAL specimens     only), is one component of a     comprehensive MRSA colonization     surveillance program. It is not     intended to diagnose MRSA     infection nor to guide or     monitor treatment for     MRSA infections.  CULTURE, BLOOD (ROUTINE X 2)     Status: None   Collection Time    04/15/14  3:50 AM      Result Value Ref Range Status   Specimen Description BLOOD RIGHT HAND   Final   Special Requests BOTTLES DRAWN AEROBIC ONLY 3CC   Final   Culture  Setup Time     Final   Value: 04/15/2014 10:04  Performed at [facility]     Final   Value:        BLOOD CULTURE RECEIVED NO GROWTH TO DATE CULTURE WILL BE HELD FOR 5 DAYS BEFORE ISSUING A FINAL NEGATIVE REPORT     Performed at [facility]   Report Status PENDING   Incomplete  CULTURE, BLOOD (ROUTINE X 2)     Status: None   Collection Time    04/15/14  4:05 AM      Result Value Ref Range Status   Specimen Description BLOOD RIGHT WRIST   Final   Special Requests BOTTLES DRAWN AEROBIC ONLY 2CC   Final   Culture  Setup Time     Final   Value: 04/15/2014 10:04     Performed at [facility]   Culture     Final   Value:        BLOOD CULTURE RECEIVED NO GROWTH TO DATE CULTURE WILL BE HELD FOR 5 DAYS BEFORE ISSUING A FINAL NEGATIVE REPORT     Performed at [facility]   Report Status PENDING   Incomplete    Medical History: Past Medical History  Diagnosis Date  . Diabetes mellitus   . Seizures   . Hemophilia   . Hypertension   . Anxiety   . Panic attacks   . Fear of     falling  . COPD (chronic obstructive pulmonary disease)   . Parkinson's disease     Medications:  Scheduled:  . antiseptic oral rinse  15 mL Mouth Rinse q12n4p  . ARIPiprazole  30 mg Oral BH-q7a  . chlorhexidine  15 mL Mouth Rinse BID  . clonazePAM  1 mg Oral BID  . divalproex  1,500 mg Oral QHS  .  docusate  100 mg Oral BID  . fentaNYL  50 mcg Intravenous Once  . heparin subcutaneous  5,000 Units Subcutaneous 3 times per day  . insulin aspart  0-15 Units Subcutaneous 6 times per day  . insulin glargine  35 Units Subcutaneous Daily  . pantoprazole (PROTONIX) IV  40 mg Intravenous QHS  . piperacillin-tazobactam (ZOSYN)  IV  3.375 g Intravenous Q8H  . sodium bicarbonate  50 mEq Intravenous Once  . sodium bicarbonate  50 mEq Intravenous Once   Assessment: 48 yof with resolving SBO and AKI (normalized UOP today, SCr improved to 2.5 from high of 9.53). Pt on Zosyn for E. Coli UTI (pan-sensitive). CCM wants broad coverage d/t concern over fever (102 today, WBC 7.8). Increasing Zosyn today from 2.25g IV q8h to 3.375g IV q8h given improvement in renal function.   Goal of Therapy:  Eradicate infection, improvement of s/sx.  Plan:  1) Increase Zosyn to 3.375 g IV every 8 hours. 2) Will follow temp, renal function, culture results, LOT, and antibiotic de-escalation plans.  Jermy Couper E. [place], Pharm.D Clinical Pharmacy Resident Pager: [phone] 04/16/2014 3:08 PM

## 2014-04-16 NOTE — Procedures (Signed)
Extubation Procedure Note  Patient Details:   Name: Starling MannsJanet F Tzeng DOB: Jun 26, 1966 MRN: [id number]   Airway Documentation:     Evaluation  O2 sats: stable throughout Complications: No apparent complications Patient did tolerate procedure well. Bilateral Breath Sounds: Rhonchi Suctioning: Airway Yes Extubated to 5l/min [place], wears 3l min [place] at home Good vocalization  Newt LukesGroendal, Elody Kleinsasser Ann 04/16/2014, 11:21 AM

## 2014-04-16 NOTE — Progress Notes (Signed)
PULMONARY / CRITICAL CARE MEDICINE   Name: [name] MRN: [id number] DOB: Dec 09, 1965    ADMISSION DATE:  04/11/2014 CONSULTATION DATE:  04/14/14  REFERRING MD :  Mauro Kaufmann, MD - [facility]) PRIMARY SERVICE: PCCM ([facility])  CHIEF COMPLAINT:  Hypotension, worsening acute renal failure, recent SBO  BRIEF PATIENT DESCRIPTION: 48 yr F with PMH baseline MR, DM2, bipolar, anxiety, seizure disorder, HTN, COPD (home O2) who presented to [facility] ED on 04/11/14 for abdominal pain, n/v, CT abd with high-grade SBO (likely due to hx adhesions) resolved with NGT had BMs on 7/21, advanced diet. Found to have E.Coli UTI and acute worsening Cr to 9.53 in < 72 hrs (b/l ~1), Renal concern for prerenal component, contrast nephropathy vs ATN, on high dose Lasix to inc UOP, currently with minimal UOP. Acute event 7/21 with persistent hypotension, continued IVF, DC'd Lasix, transferred to ICU started on Neo gtt, too unstable for HD, suspect needed CVVH, transferred to [facility] PCCM on 04/14/14.  SIGNIFICANT EVENTS / STUDIES:  7/18 - Admitted [facility]), CT Abd CT with SBO, NGT decompression 7/19 - 7/20 - Increasing Cr 7/21 - Renal US (no hydro, hypoechogenic R-mid-upper kidney, f/u imaging)          - Transfer to [facility] ICU given hypotension with AKI, inc Cr to 9.5, concern for CRRT 7/22 - Acutely encephalopathic, concern uremic, risk hyperK, required intubation >> mech vent, HD cath         - Emergent Renal consult o/n >> started CRRT (x 45 min), on Dopamine, Neo         - pH 7.2.96 (CO2 57.9) >> corrected to 7.616 (CO2 34.7) >> DC bicarb, adjust vent to reduce ventilation         - improved UOP (1.5L), determined critical K to 2.2, discontinued CRRT 7/23 - febrile to 103F, dec PCT 3.78 >> 0.75, improved Cr 5.59 >> 2.50         - CXR (R-lower atx, airspace unchanged, seems under penetrated)  LINES / TUBES: ETT 7/22>>> OGTT 7/22 >>> R-IJ HD Cath, 7/22 >>> R-Femoral  Art Line 7/22 >>> PIVs  CULTURES: Urine 7/18 >>> E.Coli UTI (pan-sensitive) Blood x2, 7/22 >>>  ANTIBIOTICS: Ceftriaxone 7/19 >>> 7/21 Vancomycin 7/21 >>>7/23 Zosyn 7/21 >>>  SUBJECTIVE: Noted to be febrile to 103F this morning, otherwise no acute events. Continued good UOP with 3.8 L. Remains on pressors with Levo, on tube feeds while intubated / on vent. Mental status remains intact with easily awakens to voice, responds appropriate and follows commands, mouths words.  VITAL SIGNS: Temp:  [101 F (38.3 C)-103 F (39.4 C)] 103 F (39.4 C) (07/23 0414) Pulse Rate:  [36-125] 106 (07/23 0600) Resp:  [8-23] 15 (07/23 0600) BP: (54-122)/(17-74) 95/38 mmHg (07/22 1900) SpO2:  [84 %-100 %] 96 % (07/23 0600) Arterial Line BP: (79-159)/(32-69) 129/60 mmHg (07/23 0600) FiO2 (%):  [40 %] 40 % (07/23 0350) HEMODYNAMICS:   VENTILATOR SETTINGS: Vent Mode:  [-] PRVC FiO2 (%):  [40 %] 40 % Set Rate:  [12 bmp] 12 bmp Vt Set:  [480 mL] 480 mL PEEP:  [5 cmH20] 5 cmH20 Pressure Support:  [5 cmH20] 5 cmH20 Plateau Pressure:  [15 cmH20-16 cmH20] 15 cmH20 INTAKE / OUTPUT: Intake/Output     07/22 0701 - 07/23 0700 07/23 0701 - 07/24 0700   P.O.     I.V. (mL/kg) 2533.1 (18)    NG/GT 614.3    IV Piggyback  100    Total Intake(mL/kg) 3247.5 (23.1)    Urine (mL/kg/hr) 3800 (1.1)    Other     Total Output 3800     Net -552.6            PHYSICAL EXAMINATION: General: NAD Neuro: min sedation, awakens easily, alert, oriented, mouths words / appropriate response, follows commands Cardiovascular:  Tachycardic, regular rhythm, no murmurs heard Lungs: stable improved bilateral breath sounds, reduced rhonchi. No wheezing. No inc WOB Abdomen:  Obese, non-tender, non-distended, +active BS Ext: improved bilateral LE edema, +1 non-pitting, symmetrical w/ resolved erythema, non-tender Skin:  Warm, dry, no rashes or wounds  LABS:  CBC  Recent Labs Lab 04/14/14 1920 04/15/14 0335  04/16/14 0500  WBC 7.2 9.0 7.8  HGB 12.2 13.1 11.1*  HCT 37.2 39.5 35.6*  PLT 220 211 162   Coag's  Recent Labs Lab 04/15/14 0140 04/15/14 0239  APTT  --  31  INR 1.29  --    BMET  Recent Labs Lab 04/15/14 0238 04/15/14 1315 04/16/14 0500  NA 139 138 141  K 2.7* 4.0 4.2  CL 83* 91* 99  CO2 33* 31 28  BUN 91* 80* 53*  CREATININE 7.44* 5.59* 2.50*  GLUCOSE 193* 178* 273*   Electrolytes  Recent Labs Lab 04/15/14 0238 04/15/14 0239 04/15/14 1011 04/15/14 1315 04/15/14 2136 04/16/14 0500  CALCIUM 8.8  --   --  8.5  --  8.4  MG  --  1.8 1.8  --  1.7  --   PHOS 5.5*  --  5.0*  --  3.2 2.5   Sepsis Markers  Recent Labs Lab 04/11/14 1752 04/14/14 1920 04/14/14 2341 04/14/14 2359 04/15/14 0239 04/16/14 0500  LATICACIDVEN 2.37* 1.6  --  1.2  --   --   PROCALCITON  --   --  2.93  --  3.78 0.75   ABG  Recent Labs Lab 04/15/14 0150 04/15/14 0315 04/15/14 0941  PHART 7.616* 7.524* 7.384  PCO2ART 34.7* 40.2 65.8*  PO2ART 264.0* 86.4 79.0*   Liver Enzymes  Recent Labs Lab 04/11/14 1742 04/12/14 0646 04/14/14 2359 04/15/14 0238 04/16/14 0500  AST 17 43* 27  --   --   ALT 13 17 10   --   --   ALKPHOS 109 102 47  --   --   BILITOT 0.4 0.3 0.4  --   --   ALBUMIN 3.8 3.7 1.9* 2.9* 2.4*   Cardiac Enzymes  Recent Labs Lab 04/15/14 0134 04/15/14 0330 04/15/14 1314  TROPONINI 0.32* <0.30 <0.30   Glucose  Recent Labs Lab 04/15/14 0802 04/15/14 1229 04/15/14 1602 04/15/14 1921 04/16/14 0017 04/16/14 0411  GLUCAP 167* 138* 197* 171* 236* 226*    Imaging US Renal  04/14/2014   CLINICAL DATA:  48 year old female with acute renal insufficiency.  EXAM: RENAL/URINARY TRACT ULTRASOUND COMPLETE  COMPARISON:  04/11/2014 and prior CTs dating back to 09/12/2010. 04/11/2007 abdominal ultrasound.  FINDINGS: Right Kidney:  Length: 11.9 cm. Upper limits of normal renal echogenicity noted. A 1.2 x 1.4 cm hypoechoic area within the mid -upper right kidney  is indeterminate. There is no evidence of hydronephrosis.  Left Kidney:  Length: 13 cm. Upper limits of normal renal echogenicity noted. A 1.2 x 0.9 x 1.3 cm lower pole cyst is noted. No mass or hydronephrosis visualized.  Bladder:  Appears normal for degree of bladder distention.  IMPRESSION: Upper limits of normal renal echogenicity bilaterally which can be seen with medical renal disease. No  evidence of hydronephrosis.  1.2 x 1.4 cm hypoechoic area within the mid-upper right kidney - unable to characterize further on this ultrasound study. Consider follow-up with elective abdominal MRI with and without contrast.   Electronically Signed   By: Laveda Abbe M.D.   On: 04/14/2014 11:47   Dg Chest Port 1 View  04/15/2014   CLINICAL DATA:  Assess endotracheal tube placement and central line placement.  EXAM: PORTABLE CHEST - 1 VIEW  COMPARISON:  Chest radiograph from 04/21/2007  FINDINGS: The patient's endotracheal tube is seen ending 4-5 cm above the carina. A right IJ sheath is seen ending about the proximal SVC.  The lungs are mildly hypoexpanded. Vascular crowding and mild vascular congestion are seen. No definite pleural effusion or pneumothorax is seen.  The cardiomediastinal silhouette is borderline normal in size. No acute osseous abnormalities are identified.  IMPRESSION: 1. Endotracheal tube seen ending 4-5 cm above the carina. 2. Right IJ sheath seen ending about the proximal SVC. 3. Lungs mildly hypoexpanded but grossly clear. Mild vascular congestion seen.   Electronically Signed   By: Roanna Raider M.D.   On: 04/15/2014 01:57   Dg Abd Portable 1v  04/15/2014   CLINICAL DATA:  Orogastric tube placement.  EXAM: PORTABLE ABDOMEN - 1 VIEW  COMPARISON:  Abdominal radiograph performed earlier today at 1:47 a.m.  FINDINGS: The patient's orogastric tube is seen ending either at the pylorus or in the first segment of the duodenum.  The visualized bowel gas pattern is grossly unremarkable. The stomach is  partially filled with air. Scattered air-filled loops of small and large bowel are noted. There is no evidence for bowel obstruction. No free intra-abdominal air is identified, though evaluation for free air is suboptimal on supine views. No acute osseous abnormalities are identified.  IMPRESSION: Orogastric tube seen ending either at the pylorus or in the first segment of the duodenum.   Electronically Signed   By: Roanna Raider M.D.   On: 04/15/2014 04:43   Dg Abd Portable 1v  04/15/2014   CLINICAL DATA:  Abdominal bloating.  EXAM: PORTABLE ABDOMEN - 1 VIEW  COMPARISON:  Abdominal radiograph performed 04/12/2014, and CT of the abdomen and pelvis from 04/11/2014  FINDINGS: The visualized bowel gas pattern is grossly unremarkable, with scattered air filled loops of small and large bowel. The stomach is partially filled with air. No small bowel dilatation is seen to suggest obstruction; previously noted bowel dilatation has resolved. No free intra-abdominal air is identified, though evaluation for free air is limited on a single supine view.  No acute osseous abnormalities are identified. Mild degenerative change is noted along the lower lumbar spine. Postoperative change is noted at the upper pelvis.  IMPRESSION: Unremarkable bowel gas pattern; no free intra-abdominal air seen. Air now seen extending into the colon, suggesting interval resolution of bowel obstruction.   Electronically Signed   By: Roanna Raider M.D.   On: 04/15/2014 02:02   ASSESSMENT / PLAN:  PULMONARY A: Acute respiratory failure, hypercapnic and hypoxemic, d/t acute encephalopathy - On vent Hx COPD, home O2 req - no wheezing on exam Hx OSA P:   Full vent support, plan to wean cpap 5 ps 5, goal 1 hr assess rsbi CXR 7/23 appears stable with some R-lower atx, repeat CXR AM if remains on vent Albuterol nebs PRN Low dose pressors, improved, allows weaning  CARDIOVASCULAR A: R/o septic Shock with hypotension refractory to IVF - On  pressors, weaning Bradycardia - Resolved with Atropine  Elevated lactate - Resolved, 1.2 R/o ischemia - Troponin max 0.32 >> neg < 0.30 No rel AI P:  Monitor BP, MAP goal >65 Continue pressor support with Levo (off Neo, Dopamine) IVF resuscitation, cvp assessment Levo to map 60, with good Mental status  RENAL A: AKI, likely pre-renal azotemia,  Dec Cr to 2.50 Hypokalemia - Resolved, 4.2 Initial concern Hyperkalemia - Resolved. On admission, clinically had near arrest hyper T, widening qrs AG metabolic acidosis (uremia), secondary metabolic alkaosis, small - Resolved P:   Continue IVF with NS @ 145 cc/hr Repeat BMET in AM Consult Renal - Remains off CRRT, resolving Cr, normalized UOP 3.8 L. Recommend f/u Renal US outpatient Renal signed off Foley needed  GASTROINTESTINAL A: SBO, high-grade - Resolved with NGT decompression, last BM 7/21. KUB 7/22 stable Ruled Out abdo compartment, clinically abdo soft - Abd pressure 14, appropriately < 20 P:   Tube feeds at goal, hold for weaning Continue Colace SUP: Protonix IV  HEMATOLOGIC A:   No acute concerns - stable WBC, Hgb plt P:  VTE Prophylaxis - SQ Heparin  INFECTIOUS A:   Septic Shock, seems less likely given rapid resolution, initial concern GI vs urosepsis PCT Resolved - Improving PCT 3.78 >> 0.75 E.Coli UTI As risk bacterial abdo trans P:   Consider de-escalate abx to Ceftriaxone, however concern with new fever Dc vanc, maintain zosyn, follow fever curve, at risk bacterial translocation Urine Cx (7/18) >> E.Coli (pan-sensitive) Blood cultures x 2 - NGTD  ENDOCRINE A:    DM2, controlled (A1c 7.3, 03/2014) TSH - stable, 0.935 Elevated Cortisol - 38.5 - appropriate without adrenal insuffiency P:  Moderate SSI Resume Lantus 35u (half home dose 70u) Monitor CBG, previously elevated on Tube feeds 200-300s No role roids  NEUROLOGIC A: Acute encephalopathy, concern uremia in AKI, increased somnolence despite  appropriate orientation Sedation on ventilator Bipolar dz P:   Fentanyl gtt sedation, Versed PRN RASS Goal -2 Depakote level 22.6, stable, clinically therapeutic Holding neurontin with AKI, likely restart in am  WUA  GLOBAL: Overall improved today, goal to titrate down Levo, start weaning vent, AKI significantly resolving with normalized UOP today, Cr dramatic improvement to 2.5, Renal signed off. Remains on broad spectrum antibiotics, febrile to 103, however UTI E.Coli pansensitive and markers with PCT normalized. Major improved overall, wean to extubate  CODE Status - Full  Saralyn PilarAlexander Karamalegos, DO [facility], PGY-2  04/16/2014, 7:14 AM  Ccm time 30 min   I have fully examined this patient and agree with above findings.    Mcarthur Rossettianiel J. Tyson AliasFeinstein, MD, FACP Pgr: [phone] [place] [facility]

## 2014-04-17 LAB — GLUCOSE, CAPILLARY
GLUCOSE-CAPILLARY: 237 mg/dL — AB (ref 70–99)
GLUCOSE-CAPILLARY: 216 mg/dL — AB (ref 70–99)
Glucose-Capillary: 135 mg/dL — ABNORMAL HIGH (ref 70–99)
Glucose-Capillary: 161 mg/dL — ABNORMAL HIGH (ref 70–99)
Glucose-Capillary: 186 mg/dL — ABNORMAL HIGH (ref 70–99)
Glucose-Capillary: 205 mg/dL — ABNORMAL HIGH (ref 70–99)
Glucose-Capillary: 89 mg/dL (ref 70–99)

## 2014-04-17 LAB — BASIC METABOLIC PANEL
Anion gap: 12 (ref 5–15)
Anion gap: 12 (ref 5–15)
BUN: 24 mg/dL — ABNORMAL HIGH (ref 6–23)
BUN: 22 mg/dL (ref 6–23)
CO2: 31 mEq/L (ref 19–32)
CO2: 29 mEq/L (ref 19–32)
CREATININE: 1.15 mg/dL — AB (ref 0.50–1.10)
CREATININE: 1.05 mg/dL (ref 0.50–1.10)
Calcium: 9 mg/dL (ref 8.4–10.5)
Calcium: 9 mg/dL (ref 8.4–10.5)
Chloride: 101 mEq/L (ref 96–112)
Chloride: 100 mEq/L (ref 96–112)
GFR calc Af Amer: 64 mL/min — ABNORMAL LOW (ref >90)
GFR calc non Af Amer: 55 mL/min — ABNORMAL LOW (ref >90)
GFR calc non Af Amer: 61 mL/min — ABNORMAL LOW (ref >90)
GFR, EST AFRICAN AMERICAN: 71 mL/min — AB (ref >90)
Glucose, Bld: 195 mg/dL — ABNORMAL HIGH (ref 70–99)
Glucose, Bld: 206 mg/dL — ABNORMAL HIGH (ref 70–99)
POTASSIUM: 4.3 meq/L (ref 3.7–5.3)
Potassium: 4.2 mEq/L (ref 3.7–5.3)
Sodium: 144 mEq/L (ref 137–147)
Sodium: 141 mEq/L (ref 137–147)

## 2014-04-17 LAB — PHOSPHORUS
PHOSPHORUS: 1.4 mg/dL — AB (ref 2.3–4.6)
Phosphorus: 2.3 mg/dL (ref 2.3–4.6)

## 2014-04-17 LAB — MAGNESIUM
MAGNESIUM: 1.6 mg/dL (ref 1.5–2.5)
MAGNESIUM: 2.2 mg/dL (ref 1.5–2.5)

## 2014-04-17 MED ORDER — INSULIN GLARGINE 100 UNIT/ML ~~LOC~~ SOLN
15.0000 [IU] | Freq: Once | SUBCUTANEOUS | Status: AC
  Administered 2014-04-17: 15 [IU] via SUBCUTANEOUS
  Filled 2014-04-17: qty 0.15

## 2014-04-17 MED ORDER — GABAPENTIN 300 MG PO CAPS
300.0000 mg | ORAL_CAPSULE | Freq: Three times a day (TID) | ORAL | Status: DC
  Administered 2014-04-17 – 2014-04-20 (×9): 300 mg via ORAL
  Filled 2014-04-17 (×11): qty 1

## 2014-04-17 MED ORDER — PANTOPRAZOLE SODIUM 40 MG PO TBEC
40.0000 mg | DELAYED_RELEASE_TABLET | Freq: Every day | ORAL | Status: DC
  Administered 2014-04-17: 40 mg via ORAL
  Filled 2014-04-17: qty 1

## 2014-04-17 MED ORDER — K PHOS MONO-SOD PHOS DI & MONO 155-852-130 MG PO TABS
250.0000 mg | ORAL_TABLET | Freq: Two times a day (BID) | ORAL | Status: AC
  Administered 2014-04-17 – 2014-04-18 (×3): 250 mg via ORAL
  Filled 2014-04-17 (×3): qty 1

## 2014-04-17 MED ORDER — INSULIN ASPART 100 UNIT/ML ~~LOC~~ SOLN
0.0000 [IU] | Freq: Three times a day (TID) | SUBCUTANEOUS | Status: DC
Start: 2014-04-18 — End: 2014-04-20
  Administered 2014-04-18 (×3): 7 [IU] via SUBCUTANEOUS
  Administered 2014-04-19 (×2): 11 [IU] via SUBCUTANEOUS
  Administered 2014-04-20: 7 [IU] via SUBCUTANEOUS
  Administered 2014-04-20: 4 [IU] via SUBCUTANEOUS

## 2014-04-17 MED ORDER — PANTOPRAZOLE SODIUM 40 MG PO TBEC: 40.0000 mg | DELAYED_RELEASE_TABLET | Freq: Every day | ORAL | Status: DC

## 2014-04-17 MED ORDER — INSULIN ASPART 100 UNIT/ML ~~LOC~~ SOLN
0.0000 [IU] | Freq: Every day | SUBCUTANEOUS | Status: DC
Start: 2014-04-17 — End: 2014-04-20
  Administered 2014-04-18 – 2014-04-19 (×2): 2 [IU] via SUBCUTANEOUS

## 2014-04-17 MED ORDER — PANTOPRAZOLE SODIUM 40 MG IV SOLR: 40.0000 mg | Freq: Every day | INTRAVENOUS | Status: DC

## 2014-04-17 MED ORDER — MAGNESIUM SULFATE 50 % IJ SOLN
4.0000 g | Freq: Once | INTRAVENOUS | Status: DC
  Filled 2014-04-17: qty 8

## 2014-04-17 MED ORDER — MAGNESIUM SULFATE 4000MG/100ML IJ SOLN
4.0000 g | Freq: Once | INTRAMUSCULAR | Status: AC
  Administered 2014-04-17: 4 g via INTRAVENOUS
  Filled 2014-04-17: qty 100

## 2014-04-17 MED ORDER — SODIUM CHLORIDE 0.45 % IV SOLN
INTRAVENOUS | Status: DC
  Administered 2014-04-17: 13:00:00 via INTRAVENOUS
  Administered 2014-04-17: 125 mL/h via INTRAVENOUS
  Administered 2014-04-18: 1000 mL via INTRAVENOUS
  Administered 2014-04-18: 125 mL/h via INTRAVENOUS
  Administered 2014-04-19: 50 mL/h via INTRAVENOUS

## 2014-04-17 MED ORDER — INSULIN GLARGINE 100 UNIT/ML ~~LOC~~ SOLN
50.0000 [IU] | Freq: Every day | SUBCUTANEOUS | Status: DC
  Administered 2014-04-18: 50 [IU] via SUBCUTANEOUS
  Filled 2014-04-17 (×2): qty 0.5

## 2014-04-17 MED ORDER — INSULIN GLARGINE 100 UNIT/ML ~~LOC~~ SOLN: 15.0000 [IU] | Freq: Every day | SUBCUTANEOUS | Status: DC

## 2014-04-17 MED ORDER — DEXTROSE 5 % IV SOLN
1.0000 g | INTRAVENOUS | Status: AC
  Administered 2014-04-17 – 2014-04-18 (×2): 1 g via INTRAVENOUS
  Filled 2014-04-17 (×2): qty 10

## 2014-04-17 NOTE — Evaluation (Signed)
Physical Therapy Evaluation Patient Details Name: [name] MRN: [id number] DOB: Feb 13, 1966 Today's Date: 04/17/2014   History of Present Illness  Pt admit with SBO and VDRF.    Clinical Impression  Pt admitted with above. Pt currently with functional limitations due to the deficits listed below (see PT Problem List). Feel that pt can go home as long as she has 24 hour care.  If no 24 hour care, may need NHP.  Pt will benefit from skilled PT to increase their independence and safety with mobility to allow discharge to the venue listed below.      Follow Up Recommendations Home health PT;Supervision/Assistance - 24 hour    Equipment Recommendations  None recommended by PT    Recommendations for Other Services       Precautions / Restrictions Precautions Precautions: Fall Restrictions Weight Bearing Restrictions: No      Mobility  Bed Mobility Overal bed mobility: Needs Assistance;+2 for physical assistance Bed Mobility: Supine to Sit     Supine to sit: Mod assist;+2 for physical assistance     General bed mobility comments: Needed assist for LES and for elevation of trunk.   Transfers Overall transfer level: Needs assistance Equipment used: Rolling walker (2 wheeled) Transfers: Sit to/from [employer] Sit to Stand: Mod assist;+2 physical assistance;From elevated surface Stand pivot transfers: Min assist;Mod assist;+2 physical assistance;From elevated surface       General transfer comment: Pt needed cues and assist for power up as well as for weight shifting to take pivotal steps to recliner.  Uncontrolled descent into chair.    Ambulation/Gait                Stairs            Wheelchair Mobility    Modified Rankin (Stroke Patients Only)       Balance Overall balance assessment: Needs assistance;History of Falls Sitting-balance support: Bilateral upper extremity supported;Feet supported Sitting balance-Leahy Scale:  Poor Sitting balance - Comments: Needed assist at EOB to maintain balance as pt tends to lean posteriorly.   Postural control: Posterior lean Standing balance support: Bilateral upper extremity supported;During functional activity Standing balance-Leahy Scale: Poor Standing balance comment: Pt needed UE support.  Needs assist for postural stability in standing.                               Pertinent Vitals/Pain HR 99-160 bpm with transfer, BP 102/71 initially.  After transfer 84/61.  After sitting in chair 5 minutes BP up to 92/77.  HR back to 101 bpm.  No pain per pt.     Home Living Family/patient expects to be discharged to:: Private residence Living Arrangements: Spouse/significant other Available Help at Discharge: Family;Available 24 hours/day Type of Home: House Home Access: Ramped entrance     Home Layout: One level Home Equipment: Bedside commode;Shower seat;Walker - 4 wheels;Wheelchair - manual;Grab bars - tub/shower Additional Comments: Sitter M-F 8-2, husband other hours    Prior Function Level of Independence: Needs assistance   Gait / Transfers Assistance Needed: Ambulated with RW in home.   ADL's / Homemaking Assistance Needed: Assist with ADLs per pt        Hand Dominance        Extremity/Trunk Assessment   Upper Extremity Assessment: Defer to OT evaluation           Lower Extremity Assessment: Generalized weakness      Cervical /  Trunk Assessment: Normal  Communication   Communication: No difficulties  Cognition Arousal/Alertness: Awake/alert Behavior During Therapy: WFL for tasks assessed/performed;Anxious Overall Cognitive Status: Within Functional Limits for tasks assessed                      General Comments General comments (skin integrity, edema, etc.): All 4 extremities with edema.     Exercises General Exercises - Lower Extremity Ankle Circles/Pumps: AROM;Both;10 reps;Seated Long Arc Quad: AROM;Both;10  reps;Seated      Assessment/Plan    PT Assessment Patient needs continued PT services  PT Diagnosis Generalized weakness   PT Problem List Decreased activity tolerance;Decreased balance;Decreased mobility;Decreased knowledge of use of DME;Decreased safety awareness;Decreased knowledge of precautions  PT Treatment Interventions DME instruction;Gait training;Functional mobility training;Therapeutic activities;Therapeutic exercise;Balance training;Patient/family education   PT Goals (Current goals can be found in the Care Plan section) Acute Rehab PT Goals Patient Stated Goal: to go home PT Goal Formulation: With patient Time For Goal Achievement: 04/24/14 Potential to Achieve Goals: Good    Frequency Min 3X/week   Barriers to discharge        Co-evaluation               End of Session Equipment Utilized During Treatment: Gait belt;Oxygen Activity Tolerance: Patient limited by fatigue Patient left: in chair;with call bell/phone within reach Nurse Communication: Mobility status         Time: [id number] PT Time Calculation (min): 18 min   Charges:   PT Evaluation $Initial PT Evaluation Tier I: 1 Procedure PT Treatments $Therapeutic Activity: 8-22 mins   PT G Codes:          INGOLD,Jlon Betker 04/17/2014, 2:51 PM [facility],PT Acute Rehabilitation [phone] (pager)

## 2014-04-17 NOTE — Progress Notes (Signed)
eLink Physician-Brief Progress Note Patient Name: [name] DOB: 08/07/1966 MRN: [id number]  Date of Service  04/17/2014   HPI/Events of Note   hypophos  eICU Interventions  Replaced with KPhos   Intervention Category Minor Interventions: Electrolytes abnormality - evaluation and management  MCQUAID, DOUGLAS 04/17/2014, 4:18 PM

## 2014-04-17 NOTE — Progress Notes (Addendum)
PULMONARY / CRITICAL CARE MEDICINE   Name: [name] MRN: [id number] DOB: 21-Sep-1966    ADMISSION DATE:  04/11/2014 CONSULTATION DATE:  04/14/14  REFERRING MD :  Mauro Kaufmann, MD - [facility]) PRIMARY SERVICE: PCCM (transferred from [facility] to [facility])  CHIEF COMPLAINT:  Hypotension, worsening acute renal failure, recent SBO  BRIEF PATIENT DESCRIPTION: 19 yr F with PMH baseline MR, DM2, bipolar, anxiety, seizure disorder, HTN, COPD (home O2) who presented to [facility] ED on 04/11/14 for abdominal pain, n/v, CT abd with high-grade SBO (likely due to hx adhesions) resolved with NGT had BMs on 7/21, advanced diet. Found to have E.Coli UTI and acute worsening Cr to 9.53 in < 72 hrs (b/l ~1), Renal concern for prerenal component, contrast nephropathy vs ATN, on high dose Lasix to inc UOP, currently with minimal UOP. Acute event 7/21 with persistent hypotension, continued IVF, DC'd Lasix, transferred to ICU started on Neo gtt, too unstable for HD, suspect needed CVVH, transferred to [facility] PCCM on 04/14/14.  SIGNIFICANT EVENTS / STUDIES:  7/18 - Admitted [facility]), CT Abd CT with SBO, NGT decompression 7/19 - 7/20 - Increasing Cr 7/21 - Renal US (no hydro, hypoechogenic R-mid-upper kidney, f/u imaging)          - Transfer to [facility] ICU given hypotension with AKI, inc Cr to 9.5, concern for CRRT 7/22 - Acutely encephalopathic, concern uremic, risk hyperK, required intubation >> mech vent, HD cath         - Emergent Renal consult o/n >> started CRRT (x 45 min), on Dopamine, Neo         - pH 7.2.96 (CO2 57.9) >> corrected to 7.616 (CO2 34.7) >> DC bicarb, adjust vent to reduce ventilation         - improved UOP (1.5L), determined critical K to 2.2, discontinued CRRT 7/23 - febrile to 103F, dec PCT 3.78 >> 0.75, improved Cr 5.59 >> 2.50, UOP 3.8L, Renal signed off         - CXR (R-lower atx, airspace unchanged, seems under penetrated)         - Extubated, removed HD cath / A-line 7/24  - Tolerating clear liquid diet, neg balance noted  LINES / TUBES: ETT 7/22>>> 7/23 OGTT 7/22 >>> R-IJ HD Cath, 7/22 >>> R-Femoral Art Line 7/22 >>>7/23 PIVs  CULTURES: Urine 7/18 >>> E.Coli UTI (pan-sensitive) Blood x2, 7/22 >>>  ANTIBIOTICS: Ceftriaxone 7/19 >>> 7/21 Vancomycin 7/21 >>>7/23 Zosyn 7/21 >>>  SUBJECTIVE: Last fever 04/16/14 1226 @ 102F. Feels much better. Tolerating clear liquid diet. Denies pain, dyspnea, abdominal pain. Reports diarrhea last night, since resolved.  VITAL SIGNS: Temp:  [97.7 F (36.5 C)-102.4 F (39.1 C)] 98.3 F (36.8 C) (07/24 0427) Pulse Rate:  [72-118] 89 (07/24 0623) Resp:  [14-24] 21 (07/24 0623) BP: (74-119)/(37-64) 97/58 mmHg (07/24 0623) SpO2:  [77 %-100 %] 100 % (07/24 0623) Arterial Line BP: (100-150)/(48-82) 122/61 mmHg (07/23 1730) FiO2 (%):  [40 %] 40 % (07/23 0907) HEMODYNAMICS:   VENTILATOR SETTINGS: Vent Mode:  [-] CPAP;PSV FiO2 (%):  [40 %] 40 % PEEP:  [5 cmH20] 5 cmH20 Pressure Support:  [5 cmH20] 5 cmH20 INTAKE / OUTPUT: Intake/Output     07/23 0701 - 07/24 0700 07/24 0701 - 07/25 0700   I.V. (mL/kg) 347.2 (2.5)    NG/GT 210    IV Piggyback 150    Total Intake(mL/kg) 707.2 (5)    Urine (mL/kg/hr) 3675 (1.1)    Total Output  1478     Net -2967.8          Stool Occurrence 1 x      PHYSICAL EXAMINATION: General: conversational, NAD Neuro: awake, alert, oriented, grossly non-focal Cardiovascular:  Tachycardic, regular rhythm, no murmurs heard Lungs: Improved bilateral breath sounds, reduced rhonchi. No wheezing. No inc WOB Abdomen:  Obese, non-tender, non-distended, +active BS Ext: improved bilateral LE edema, +1 non-pitting, symmetrical w/ resolved erythema, non-tender Skin:  Warm, dry, no rashes or wounds  LABS:  CBC  Recent Labs Lab 04/14/14 1920 04/15/14 0335 04/16/14 0500  WBC 7.2 9.0 7.8  HGB 12.2 13.1 11.1*  HCT 37.2 39.5 35.6*  PLT 220 211 162   Coag's  Recent Labs Lab 04/15/14 0140  04/15/14 0239  APTT  --  31  INR 1.29  --    BMET  Recent Labs Lab 04/15/14 0238 04/15/14 1315 04/16/14 0500  NA 139 138 141  K 2.7* 4.0 4.2  CL 83* 91* 99  CO2 33* 31 28  BUN 91* 80* 53*  CREATININE 7.44* 5.59* 2.50*  GLUCOSE 193* 178* 273*   Electrolytes  Recent Labs Lab 04/15/14 0238  04/15/14 1315 04/15/14 2136 04/16/14 0500 04/16/14 0825 04/17/14 0520  CALCIUM 8.8  --  8.5  --  8.4  --   --   MG  --   < >  --  1.7  --  1.5 1.6  PHOS 5.5*  < >  --  3.2 2.5 2.2* 2.3  < > = values in this interval not displayed. Sepsis Markers  Recent Labs Lab 04/11/14 1752 04/14/14 1920 04/14/14 2341 04/14/14 2359 04/15/14 0239 04/16/14 0500  LATICACIDVEN 2.37* 1.6  --  1.2  --   --   PROCALCITON  --   --  2.93  --  3.78 0.75   ABG  Recent Labs Lab 04/15/14 0150 04/15/14 0315 04/15/14 0941  PHART 7.616* 7.524* 7.384  PCO2ART 34.7* 40.2 65.8*  PO2ART 264.0* 86.4 79.0*   Liver Enzymes  Recent Labs Lab 04/11/14 1742 04/12/14 0646 04/14/14 2359 04/15/14 0238 04/16/14 0500  AST 17 43* 27  --   --   ALT 13 17 10   --   --   ALKPHOS 109 102 47  --   --   BILITOT 0.4 0.3 0.4  --   --   ALBUMIN 3.8 3.7 1.9* 2.9* 2.4*   Cardiac Enzymes  Recent Labs Lab 04/15/14 0134 04/15/14 0330 04/15/14 1314  TROPONINI 0.32* <0.30 <0.30   Glucose  Recent Labs Lab 04/16/14 0411 04/16/14 0804 04/16/14 1226 04/16/14 1551 04/16/14 2352 04/17/14 0405  GLUCAP 226* 299* 280* 168* 161* 89    Imaging Dg Chest Port 1 View  04/16/2014   CLINICAL DATA:  Intubation.  EXAM: PORTABLE CHEST - 1 VIEW  COMPARISON:  04/15/2014.  FINDINGS: Endotracheal tube and right IJ line in stable position. NG tube noted with tip below the left hemidiaphragm. Cardiomegaly. Bilateral pulmonary alveolar infiltrates. Right base atelectasis. No pneumothorax. No acute osseus abnormality.  IMPRESSION: 1. Interim placement of NG tube, its tip is below the hemidiaphragm. Endotracheal tube and right  IJ line in stable position. 2. Development of new bilateral pulmonary alveolar infiltrates. New onset right base atelectasis. 3. Cardiomegaly. Component of congestive heart failure may be present.   Electronically Signed   By: Maisie Fus  Register   On: 04/16/2014 07:28   ASSESSMENT / PLAN:  PULMONARY A: Acute respiratory failure, hypercapnic and hypoxemic, d/t acute encephalopathy - Extubated 7/23 Hx  COPD, home O2 req - no wheezing on exam Hx OSA P:   Monitor, no resp support needed, supplemental O2 PRN Albuterol nebs PRN  CARDIOVASCULAR A: R/o septic Shock with hypotension refractory to IVF - Off pressors 7/23 Bradycardia - Resolved with Atropine Elevated lactate - Resolved, 1.2 R/o ischemia - Troponin max 0.32 >> neg < 0.30 ectopy No rel AI At risk hypotension with post atn diuresis P:  Monitor BP, MAP goal >65 Off pressors Consider resuming home PO meds after transfer out of ICU Beat losses to make pos Echo for ectopy , bigeminy to assess structural heart dz  RENAL A: AKI, likely pre-renal azotemia - Improved Initial concern Hyperkalemia - Resolved. On admission, clinically had near arrest hyper T, widening qrs AG metabolic acidosis (uremia), secondary metabolic alkaosis, small - Resolved POST atn diuresis, I wonder with her presentation of rapid rise crt if she was obstructed at some stage and now this output P:   Continue IVF with NS to 1/2 NS and to 250 hr, beat by 50 cc/hr , await am chem  Repeat BMET q12h as above Renal signed off. Recommend f/u Renal US outpatient Will pull out hd cath when line not needed Foley dc when output reduced  GASTROINTESTINAL A: SBO, high-grade - Resolved with NGT decompression, last BM 7/21. KUB 7/22 stable Ruled Out abdo compartment, clinically abdo soft - Abd pressure 14, appropriately < 20 P:   Tolerating clear liquid diet, advance as tolerated today to Carb Modified diet Dc Colace SUP: Protonix PO  HEMATOLOGIC A:   No acute  concerns P:  VTE Prophylaxis - SQ Heparin  INFECTIOUS A:   Septic Shock, seems less likely given rapid resolution, initial concern GI vs urosepsis - Resolved PCT Resolved (was elevated with crt) E.Coli UTI As risk bacterial abdo trans Fever resolved 7/23 P:   Continue Zosyn x 24-48 hours, initial concern for bacterial translocation in setting of prior SBO Consider de-escalate abx to Ceftriaxone, as fever resolved Urine Cx (7/18) >> E.Coli (pan-sensitive) Blood cultures x 2 - NGTD  ENDOCRINE A:    DM2, controlled (A1c 7.3, 03/2014) TSH - stable, 0.935 Elevated Cortisol - 38.5 - appropriate without adrenal insuffiency P:  Moderate SSI Continue Lantus 35u (half home dose 70u) Monitor CBG, 90-160s No role steroids  NEUROLOGIC A: Acute encephalopathy, concern uremia in AKI - Resolved Bipolar dz P:   S/p extubated 7/23, off sedation Depakote level 22.6, stable, clinically therapeutic Restart home neurontin  GLOBAL: Transfer to out of ICU. To tele, better, post atn diuresis, keep pos with more volume, bmet pedning this am   To [facility],   CODE Status - Full  Saralyn PilarAlexander Karamalegos, DO [facility], PGY-2  04/17/2014, 7:23 AM  I have fully examined this patient and agree with above findings.     Mcarthur Rossettianiel J. Tyson AliasFeinstein, MD, FACP Pgr: [phone] [facility]

## 2014-04-18 LAB — COMPREHENSIVE METABOLIC PANEL
ALK PHOS: 69 U/L (ref 39–117)
ALT: 29 U/L (ref 0–35)
ANION GAP: 14 (ref 5–15)
AST: 42 U/L — ABNORMAL HIGH (ref 0–37)
Albumin: 2.4 g/dL — ABNORMAL LOW (ref 3.5–5.2)
BUN: 17 mg/dL (ref 6–23)
CALCIUM: 8.7 mg/dL (ref 8.4–10.5)
CO2: 29 mEq/L (ref 19–32)
CREATININE: 0.98 mg/dL (ref 0.50–1.10)
Chloride: 102 mEq/L (ref 96–112)
GFR calc non Af Amer: 67 mL/min — ABNORMAL LOW (ref >90)
GFR, EST AFRICAN AMERICAN: 77 mL/min — AB (ref >90)
GLUCOSE: 231 mg/dL — AB (ref 70–99)
Potassium: 4.1 mEq/L (ref 3.7–5.3)
Sodium: 145 mEq/L (ref 137–147)
TOTAL PROTEIN: 5.8 g/dL — AB (ref 6.0–8.3)
Total Bilirubin: 0.2 mg/dL — ABNORMAL LOW (ref 0.3–1.2)

## 2014-04-18 LAB — GLUCOSE, CAPILLARY
GLUCOSE-CAPILLARY: 240 mg/dL — AB (ref 70–99)
Glucose-Capillary: 209 mg/dL — ABNORMAL HIGH (ref 70–99)
Glucose-Capillary: 212 mg/dL — ABNORMAL HIGH (ref 70–99)
Glucose-Capillary: 208 mg/dL — ABNORMAL HIGH (ref 70–99)

## 2014-04-18 LAB — CLOSTRIDIUM DIFFICILE BY PCR: CDIFFPCR: NEGATIVE

## 2014-04-18 NOTE — Progress Notes (Signed)
[facility] PROGRESS NOTE  [name] ZOX:[id number] DOB: May 30, 1966 DOA: 04/11/2014 PCP: No primary provider on file. Interim summary: 49 yr F with PMH baseline MR, DM2, bipolar, anxiety, seizure disorder, HTN, COPD (home O2) who presented to [facility] ED on 04/11/14 for abdominal pain, n/v, CT abd with high-grade SBO (likely due to hx adhesions) resolved with NGT . She was  found to have e coli UTI, completed the course of antibiotics. She developed ARF, probably secondary to a combination of volume depletion , hypotension with ACE inhibitor on board and IV contrast. Renal consulted and she was transferred to ICU and was under PCCM service, where she was started on vasopressors.  Her renal function has improved markedly and her hypotension resolved. She was transferred back to [facility] service on 7/25 for further management.   SIGNIFICANT EVENTS / STUDIES:  7/18 - Admitted [facility]), CT Abd CT with SBO, NGT decompression  7/19 - 7/20 - Increasing Cr  7/21 - Renal US (no hydro, hypoechogenic R-mid-upper kidney, f/u imaging)  - Transfer to [facility] ICU given hypotension with AKI, inc Cr to 9.5, concern for CRRT  7/22 - Acutely encephalopathic, concern uremic, risk hyperK, required intubation >> mech vent, HD cath  - Emergent Renal consult o/n >> started CRRT (x 45 min), on Dopamine, Neo  - pH 7.2.96 (CO2 57.9) >> corrected to 7.616 (CO2 34.7) >> DC bicarb, adjust vent to reduce ventilation  - improved UOP (1.5L), determined critical K to 2.2, discontinued CRRT  7/23 - febrile to 103F, dec PCT 3.78 >> 0.75, improved Cr 5.59 >> 2.50, UOP 3.8L, Renal signed off  - CXR (R-lower atx, airspace unchanged, seems under penetrated)  - Extubated, removed HD cath / A-line  7/24 - Tolerating clear liquid diet, neg balance noted. Advanced diet .  7/25 pt transferred to [facility] service and she is on carb modified diet.   Assessment/Plan: 1. High grade SBO;  - resolved with NGT and is on carb modified diet.    2. Acute respiratory failure secondary to hypercapnia and hypoxemia from possible copd : Nebs as needed.   3. Questionable septic chock from UTI; - off pressors on 7/23. Currently BP are stable.   4. Mild elevation of troponin: - possibly from the demand ischemia  From septic shock.  - echocardiogram ordered.   5. Acute kidney injury: Possibly from a combination of hypotension, ace inhibitors and volume depletion.  Back to baseline now.    6. Diabetes mellitus; - SSI.  - resume lantus.    7. Acute encephalopathy: Probably from the above. Is not at baseline yet.    8. DVT prophylaxis.      Code Status: full code.  Family Communication: none at bedside Disposition Plan: pending.    Consultants:  Renal     Procedures: ETT 7/22>>> 7/23  OGTT 7/22 >>>  R-IJ HD Cath, 7/22 >>>  R-Femoral Art Line 7/22 >>>7/23  PIVs   Antibiotics:  Completed rocephin and vancomycin .     HPI/Subjective: Denies any new complaints. No family at bedside Objective: Filed Vitals:   04/18/14 1016  BP: 106/68  Pulse: 58  Temp: 98.4 F (36.9 C)  Resp: 18    Intake/Output Summary (Last 24 hours) at 04/18/14 1241 Last data filed at 04/18/14 1044  Gross per 24 hour  Intake 2908.75 ml  Output   2885 ml  Net  23.75 ml   Filed Weights   04/17/14 0900 04/17/14 2010 04/18/14 0500  Weight: 139.708  kg (308 lb) 145.151 kg (320 lb) 145.15 kg (320 lb)    Exam:   General:  Alert but appears confused  Cardiovascular: s1s2  Respiratory: ctab  Abdomen: soft OBESE,   Musculoskeletal:  1+ edema. Non tender.   Data Reviewed: Basic Metabolic Panel:  Recent Labs Lab 04/15/14 1011 04/15/14 1315 04/15/14 2136 04/16/14 0500 04/16/14 0825 04/17/14 0520 04/17/14 1310 04/17/14 1407 04/17/14 1854 04/18/14 0530  NA  --  138  --  141  --   --  144  --  141 145  K  --  4.0  --  4.2  --   --  4.3  --  4.2 4.1  CL  --  91*  --  99  --   --  101  --  100 102  CO2  --  31   --  28  --   --  31  --  29 29  GLUCOSE  --  178*  --  273*  --   --  195*  --  206* 231*  BUN  --  80*  --  53*  --   --  24*  --  22 17  CREATININE  --  5.59*  --  2.50*  --   --  1.15*  --  1.05 0.98  CALCIUM  --  8.5  --  8.4  --   --  9.0  --  9.0 8.7  MG 1.8  --  1.7  --  1.5 1.6  --  2.2  --   --   PHOS 5.0*  --  3.2 2.5 2.2* 2.3  --  1.4*  --   --    Liver Function Tests:  Recent Labs Lab 04/11/14 1742 04/12/14 0646 04/14/14 2359 04/15/14 0238 04/16/14 0500 04/18/14 0530  AST 17 43* 27  --   --  42*  ALT 13 17 10   --   --  29  ALKPHOS 109 102 47  --   --  69  BILITOT 0.4 0.3 0.4  --   --  0.2*  PROT 7.9 7.6 4.3*  --   --  5.8*  ALBUMIN 3.8 3.7 1.9* 2.9* 2.4* 2.4*    Recent Labs Lab 04/11/14 1742  LIPASE 32   No results found for this basename: AMMONIA,  in the last 168 hours CBC:  Recent Labs Lab 04/13/14 0539 04/14/14 0557 04/14/14 1920 04/15/14 0335 04/16/14 0500  WBC 5.7 8.2 7.2 9.0 7.8  HGB 15.5* 14.4 12.2 13.1 11.1*  HCT 47.1* 44.5 37.2 39.5 35.6*  MCV 85.9 85.7 84.7 85.3 89.4  PLT 246 281 220 211 162   Cardiac Enzymes:  Recent Labs Lab 04/14/14 1920 04/15/14 0134 04/15/14 0330 04/15/14 1314  CKTOTAL 1032*  --   --   --   TROPONINI  --  0.32* <0.30 <0.30   BNP (last 3 results) No results found for this basename: PROBNP,  in the last 8760 hours CBG:  Recent Labs Lab 04/17/14 1622 04/17/14 1954 04/17/14 2207 04/18/14 0005 04/18/14 0421  GLUCAP 237* 216* 186* 209* 212*    Recent Results (from the past 240 hour(s))  URINE CULTURE     Status: None   Collection Time    04/11/14  7:25 PM      Result Value Ref Range Status   Specimen Description URINE, CLEAN CATCH   Final   Special Requests NONE   Final   Culture  Setup Time  Final   Value: 04/12/2014 19:50     Performed at [facility]   Colony Count     Final   Value: >=100,000 COLONIES/ML     Performed at [facility]   Culture     Final   Value:  ESCHERICHIA COLI     Performed at [facility]   Report Status 04/15/2014 FINAL   Final   Organism ID, Bacteria ESCHERICHIA COLI   Final  MRSA PCR SCREENING     Status: None   Collection Time    04/14/14 11:49 PM      Result Value Ref Range Status   MRSA by PCR NEGATIVE  NEGATIVE Final   Comment:            The GeneXpert MRSA Assay (FDA     approved for NASAL specimens     only), is one component of a     comprehensive MRSA colonization     surveillance program. It is not     intended to diagnose MRSA     infection nor to guide or     monitor treatment for     MRSA infections.  CULTURE, BLOOD (ROUTINE X 2)     Status: None   Collection Time    04/15/14  3:50 AM      Result Value Ref Range Status   Specimen Description BLOOD RIGHT HAND   Final   Special Requests BOTTLES DRAWN AEROBIC ONLY 3CC   Final   Culture  Setup Time     Final   Value: 04/15/2014 10:04     Performed at [facility]   Culture     Final   Value:        BLOOD CULTURE RECEIVED NO GROWTH TO DATE CULTURE WILL BE HELD FOR 5 DAYS BEFORE ISSUING A FINAL NEGATIVE REPORT     Performed at [facility]   Report Status PENDING   Incomplete  CULTURE, BLOOD (ROUTINE X 2)     Status: None   Collection Time    04/15/14  4:05 AM      Result Value Ref Range Status   Specimen Description BLOOD RIGHT WRIST   Final   Special Requests BOTTLES DRAWN AEROBIC ONLY 2CC   Final   Culture  Setup Time     Final   Value: 04/15/2014 10:04     Performed at [facility]   Culture     Final   Value:        BLOOD CULTURE RECEIVED NO GROWTH TO DATE CULTURE WILL BE HELD FOR 5 DAYS BEFORE ISSUING A FINAL NEGATIVE REPORT     Performed at [facility]   Report Status PENDING   Incomplete     Studies: No results found.  Scheduled Meds: . ARIPiprazole  30 mg Oral BH-q7a  . cefTRIAXone (ROCEPHIN)  IV  1 g Intravenous Q24H  . clonazePAM  1 mg Oral BID  . divalproex  1,500 mg Oral QHS  . docusate   100 mg Oral BID  . gabapentin  300 mg Oral TID  . heparin subcutaneous  5,000 Units Subcutaneous 3 times per day  . insulin aspart  0-20 Units Subcutaneous TID WC  . insulin aspart  0-5 Units Subcutaneous QHS  . insulin glargine  50 Units Subcutaneous Daily  . phosphorus  250 mg Oral BID   Continuous Infusions: . sodium chloride 125 mL/hr (04/18/14 0525)  . fentaNYL infusion INTRAVENOUS Stopped (04/16/14  1109)    Principal Problem:   SBO (small bowel obstruction) Active Problems:   Bipolar disorder   DM type 2 (diabetes mellitus, type 2)   Seizure disorder   ARF (acute renal failure)   Acute respiratory failure with hypoxia   Septic shock(785.52)    Time spent: 25 minutes    Chardai Gangemi  [facility] Pager [phone]. If 7PM-7AM, please contact night-coverage at [email or web address], password [facility] 04/18/2014, 12:41 PM  LOS: 7 days

## 2014-04-19 DIAGNOSIS — I517 Cardiomegaly: Secondary | ICD-10-CM

## 2014-04-19 LAB — BASIC METABOLIC PANEL
Anion gap: 12 (ref 5–15)
BUN: 13 mg/dL (ref 6–23)
CALCIUM: 8.8 mg/dL (ref 8.4–10.5)
CO2: 26 meq/L (ref 19–32)
Chloride: 106 mEq/L (ref 96–112)
Creatinine, Ser: 0.97 mg/dL (ref 0.50–1.10)
GFR calc Af Amer: 78 mL/min — ABNORMAL LOW (ref >90)
GFR calc non Af Amer: 67 mL/min — ABNORMAL LOW (ref >90)
GLUCOSE: 256 mg/dL — AB (ref 70–99)
Potassium: 4.7 mEq/L (ref 3.7–5.3)
Sodium: 144 mEq/L (ref 137–147)

## 2014-04-19 LAB — CBC
HEMATOCRIT: 36.5 % (ref 36.0–46.0)
HEMOGLOBIN: 11.3 g/dL — AB (ref 12.0–15.0)
MCH: 27.7 pg (ref 26.0–34.0)
MCHC: 31 g/dL (ref 30.0–36.0)
MCV: 89.5 fL (ref 78.0–100.0)
Platelets: 178 10*3/uL (ref 150–400)
RBC: 4.08 MIL/uL (ref 3.87–5.11)
RDW: 13.6 % (ref 11.5–15.5)
WBC: 6.3 10*3/uL (ref 4.0–10.5)

## 2014-04-19 LAB — GLUCOSE, CAPILLARY
GLUCOSE-CAPILLARY: 290 mg/dL — AB (ref 70–99)
GLUCOSE-CAPILLARY: 281 mg/dL — AB (ref 70–99)
Glucose-Capillary: 243 mg/dL — ABNORMAL HIGH (ref 70–99)
Glucose-Capillary: 254 mg/dL — ABNORMAL HIGH (ref 70–99)

## 2014-04-19 LAB — MAGNESIUM: Magnesium: 1.6 mg/dL (ref 1.5–2.5)

## 2014-04-19 LAB — PHOSPHORUS: PHOSPHORUS: 2.4 mg/dL (ref 2.3–4.6)

## 2014-04-19 MED ORDER — INSULIN GLARGINE 100 UNIT/ML ~~LOC~~ SOLN
55.0000 [IU] | Freq: Every day | SUBCUTANEOUS | Status: DC
  Administered 2014-04-19: 55 [IU] via SUBCUTANEOUS
  Filled 2014-04-19 (×2): qty 0.55

## 2014-04-19 NOTE — Progress Notes (Signed)
[facility] PROGRESS NOTE  MARTHE DANT ZOX:[id number] DOB: [birth year] DOA: 04/11/2014 PCP: No primary provider on file. Interim summary: 89 yr F with PMH baseline MR, DM2, bipolar, anxiety, seizure disorder, HTN, COPD (home O2) who presented to [facility] ED on 04/11/14 for abdominal pain, n/v, CT abd with high-grade SBO (likely due to hx adhesions) resolved with NGT . She was  found to have e coli UTI, completed the course of antibiotics. She developed ARF, probably secondary to a combination of volume depletion , hypotension with ACE inhibitor on board and IV contrast. Renal consulted and she was transferred to ICU and was under PCCM service, where she was started on vasopressors.  Her renal function has improved markedly and her hypotension resolved. She was transferred back to [facility] service on 7/25 for further management.   SIGNIFICANT EVENTS / STUDIES:  7/18 - Admitted [facility]), CT Abd CT with SBO, NGT decompression  7/19 - 7/20 - Increasing Cr  7/21 - Renal US (no hydro, hypoechogenic R-mid-upper kidney, f/u imaging)  - Transfer to [facility] ICU given hypotension with AKI, inc Cr to 9.5, concern for CRRT  7/22 - Acutely encephalopathic, concern uremic, risk hyperK, required intubation >> mech vent, HD cath  - Emergent Renal consult o/n >> started CRRT (x 45 min), on Dopamine, Neo  - pH 7.2.96 (CO2 57.9) >> corrected to 7.616 (CO2 34.7) >> DC bicarb, adjust vent to reduce ventilation  - improved UOP (1.5L), determined critical K to 2.2, discontinued CRRT  7/23 - febrile to 103F, dec PCT 3.78 >> 0.75, improved Cr 5.59 >> 2.50, UOP 3.8L, Renal signed off  - CXR (R-lower atx, airspace unchanged, seems under penetrated)  - Extubated, removed HD cath / A-line  7/24 - Tolerating clear liquid diet, neg balance noted. Advanced diet .  7/25 pt transferred to [facility] service and she is on carb modified diet.   Assessment/Plan: 1. High grade SBO;  - resolved with NGT and is on carb modified diet.    2. Acute respiratory failure secondary to hypercapnia and hypoxemia from possible copd : Nebs as needed.   3. Questionable septic chock from UTI; - off pressors on 7/23. Currently BP are stable.   4. Mild elevation of troponin: - possibly from the demand ischemia  From septic shock.  - echocardiogram ordered, done results pending.   5. Acute kidney injury: Possibly from a combination of hypotension, ace inhibitors and volume depletion.  Back to baseline now.    6. Diabetes mellitus; - SSI.  - resume lantus.  CBG (last 3)   Recent Labs  04/19/14 0629 04/19/14 0829 04/19/14 1052  GLUCAP 243* 290* 281*       7. Acute encephalopathy: Probably from the above. Is not at baseline yet.    8. DVT prophylaxis.      Code Status: full code.  Family Communication: none at bedside Disposition Plan: pending.    Consultants:  Renal     Procedures: ETT 7/22>>> 7/23  OGTT 7/22 >>>  R-IJ HD Cath, 7/22 >>>  R-Femoral Art Line 7/22 >>>7/23  PIVs   Antibiotics:  Completed rocephin and vancomycin .     HPI/Subjective: Denies any new complaints. No family at bedside Objective: Filed Vitals:   04/19/14 0908  BP: 150/70  Pulse: 109  Temp: 98.2 F (36.8 C)  Resp: 20    Intake/Output Summary (Last 24 hours) at 04/19/14 1608 Last data filed at 04/19/14 1145  Gross per 24 hour  Intake  3875 ml  Output   3675 ml  Net    200 ml   Filed Weights   04/18/14 0500 04/18/14 2139 04/19/14 0500  Weight: 145.15 kg (320 lb) 147.85 kg (325 lb 15.2 oz) 147.85 kg (325 lb 15.2 oz)    Exam:   General:  Alert but appears confused  Cardiovascular: s1s2  Respiratory: ctab  Abdomen: soft OBESE,   Musculoskeletal:  1+ edema. Non tender.   Data Reviewed: Basic Metabolic Panel:  Recent Labs Lab 04/15/14 2136 04/16/14 0500 04/16/14 0825 04/17/14 0520 04/17/14 1310 04/17/14 1407 04/17/14 1854 04/18/14 0530 04/19/14 0520  NA  --  141  --   --  144  --   141 145 144  K  --  4.2  --   --  4.3  --  4.2 4.1 4.7  CL  --  99  --   --  101  --  100 102 106  CO2  --  28  --   --  31  --  29 29 26   GLUCOSE  --  273*  --   --  195*  --  206* 231* 256*  BUN  --  53*  --   --  24*  --  22 17 13   CREATININE  --  2.50*  --   --  1.15*  --  1.05 0.98 0.97  CALCIUM  --  8.4  --   --  9.0  --  9.0 8.7 8.8  MG 1.7  --  1.5 1.6  --  2.2  --   --  1.6  PHOS 3.2 2.5 2.2* 2.3  --  1.4*  --   --  2.4   Liver Function Tests:  Recent Labs Lab 04/14/14 2359 04/15/14 0238 04/16/14 0500 04/18/14 0530  AST 27  --   --  42*  ALT 10  --   --  29  ALKPHOS 47  --   --  69  BILITOT 0.4  --   --  0.2*  PROT 4.3*  --   --  5.8*  ALBUMIN 1.9* 2.9* 2.4* 2.4*   No results found for this basename: LIPASE, AMYLASE,  in the last 168 hours No results found for this basename: AMMONIA,  in the last 168 hours CBC:  Recent Labs Lab 04/14/14 0557 04/14/14 1920 04/15/14 0335 04/16/14 0500 04/19/14 0520  WBC 8.2 7.2 9.0 7.8 6.3  HGB 14.4 12.2 13.1 11.1* 11.3*  HCT 44.5 37.2 39.5 35.6* 36.5  MCV 85.7 84.7 85.3 89.4 89.5  PLT 281 220 211 162 178   Cardiac Enzymes:  Recent Labs Lab 04/14/14 1920 04/15/14 0134 04/15/14 0330 04/15/14 1314  CKTOTAL 1032*  --   --   --   TROPONINI  --  0.32* <0.30 <0.30   BNP (last 3 results) No results found for this basename: PROBNP,  in the last 8760 hours CBG:  Recent Labs Lab 04/18/14 1826 04/18/14 2138 04/19/14 0629 04/19/14 0829 04/19/14 1052  GLUCAP 240* 208* 243* 290* 281*    Recent Results (from the past 240 hour(s))  URINE CULTURE     Status: None   Collection Time    04/11/14  7:25 PM      Result Value Ref Range Status   Specimen Description URINE, CLEAN CATCH   Final   Special Requests NONE   Final   Culture  Setup Time     Final   Value: 04/12/2014 19:50  Performed at [facility]   Colony Count     Final   Value: >=100,000 COLONIES/ML     Performed at [facility]   Culture      Final   Value: ESCHERICHIA COLI     Performed at [facility]   Report Status 04/15/2014 FINAL   Final   Organism ID, Bacteria ESCHERICHIA COLI   Final  MRSA PCR SCREENING     Status: None   Collection Time    04/14/14 11:49 PM      Result Value Ref Range Status   MRSA by PCR NEGATIVE  NEGATIVE Final   Comment:            The GeneXpert MRSA Assay (FDA     approved for NASAL specimens     only), is one component of a     comprehensive MRSA colonization     surveillance program. It is not     intended to diagnose MRSA     infection nor to guide or     monitor treatment for     MRSA infections.  CULTURE, BLOOD (ROUTINE X 2)     Status: None   Collection Time    04/15/14  3:50 AM      Result Value Ref Range Status   Specimen Description BLOOD RIGHT HAND   Final   Special Requests BOTTLES DRAWN AEROBIC ONLY 3CC   Final   Culture  Setup Time     Final   Value: 04/15/2014 10:04     Performed at [facility]   Culture     Final   Value:        BLOOD CULTURE RECEIVED NO GROWTH TO DATE CULTURE WILL BE HELD FOR 5 DAYS BEFORE ISSUING A FINAL NEGATIVE REPORT     Performed at [facility]   Report Status PENDING   Incomplete  CULTURE, BLOOD (ROUTINE X 2)     Status: None   Collection Time    04/15/14  4:05 AM      Result Value Ref Range Status   Specimen Description BLOOD RIGHT WRIST   Final   Special Requests BOTTLES DRAWN AEROBIC ONLY 2CC   Final   Culture  Setup Time     Final   Value: 04/15/2014 10:04     Performed at [facility]   Culture     Final   Value:        BLOOD CULTURE RECEIVED NO GROWTH TO DATE CULTURE WILL BE HELD FOR 5 DAYS BEFORE ISSUING A FINAL NEGATIVE REPORT     Performed at [facility]   Report Status PENDING   Incomplete  CLOSTRIDIUM DIFFICILE BY PCR     Status: None   Collection Time    04/18/14  3:50 PM      Result Value Ref Range Status   C difficile by pcr NEGATIVE  NEGATIVE Final     Studies: No results  found.  Scheduled Meds: . ARIPiprazole  30 mg Oral BH-q7a  . clonazePAM  1 mg Oral BID  . divalproex  1,500 mg Oral QHS  . docusate  100 mg Oral BID  . gabapentin  300 mg Oral TID  . heparin subcutaneous  5,000 Units Subcutaneous 3 times per day  . insulin aspart  0-20 Units Subcutaneous TID WC  . insulin aspart  0-5 Units Subcutaneous QHS  . insulin glargine  55 Units Subcutaneous Daily   Continuous Infusions: .  fentaNYL infusion INTRAVENOUS Stopped (04/16/14 1109)    Principal Problem:   SBO (small bowel obstruction) Active Problems:   Bipolar disorder   DM type 2 (diabetes mellitus, type 2)   Seizure disorder   ARF (acute renal failure)   Acute respiratory failure with hypoxia   Septic shock(785.52)    Time spent: 25 minutes    Tamikka Pilger  [facility] Pager [phone]. If 7PM-7AM, please contact night-coverage at [email or web address], password [facility] 04/19/2014, 4:08 PM  LOS: 8 days

## 2014-04-19 NOTE — Progress Notes (Signed)
  Echocardiogram 2D Echocardiogram has been performed.  Leta JunglingCooper, Jermane Brayboy M 04/19/2014, 1:53 PM

## 2014-04-20 LAB — GLUCOSE, CAPILLARY
GLUCOSE-CAPILLARY: 212 mg/dL — AB (ref 70–99)
Glucose-Capillary: 162 mg/dL — ABNORMAL HIGH (ref 70–99)
Glucose-Capillary: 249 mg/dL — ABNORMAL HIGH (ref 70–99)

## 2014-04-20 MED ORDER — GABAPENTIN 300 MG PO CAPS: 300.0000 mg | ORAL_CAPSULE | Freq: Three times a day (TID) | ORAL | Status: DC

## 2014-04-20 MED ORDER — METOPROLOL TARTRATE 25 MG PO TABS
25.0000 mg | ORAL_TABLET | Freq: Two times a day (BID) | ORAL | Status: DC
  Administered 2014-04-20: 25 mg via ORAL
  Filled 2014-04-20 (×2): qty 1

## 2014-04-20 MED ORDER — INSULIN GLARGINE 100 UNIT/ML ~~LOC~~ SOLN: 60.0000 [IU] | Freq: Every day | SUBCUTANEOUS | Status: DC

## 2014-04-20 MED ORDER — REPAGLINIDE 0.5 MG PO TABS
0.5000 mg | ORAL_TABLET | Freq: Three times a day (TID) | ORAL | Status: DC
  Filled 2014-04-20 (×3): qty 1

## 2014-04-20 MED ORDER — ISOSORBIDE MONONITRATE ER 30 MG PO TB24
30.0000 mg | ORAL_TABLET | Freq: Every day | ORAL | Status: DC
  Administered 2014-04-20: 30 mg via ORAL
  Filled 2014-04-20 (×2): qty 1

## 2014-04-20 MED ORDER — INSULIN ASPART 100 UNIT/ML ~~LOC~~ SOLN: SUBCUTANEOUS | Status: DC

## 2014-04-20 MED ORDER — INSULIN GLARGINE 100 UNIT/ML ~~LOC~~ SOLN
60.0000 [IU] | Freq: Every day | SUBCUTANEOUS | Status: DC
Start: 2014-04-20 — End: 2014-04-20
  Administered 2014-04-20: 60 [IU] via SUBCUTANEOUS
  Filled 2014-04-20: qty 0.6

## 2014-04-20 MED ORDER — INSULIN ASPART 100 UNIT/ML ~~LOC~~ SOLN
3.0000 [IU] | Freq: Three times a day (TID) | SUBCUTANEOUS | Status: DC
  Administered 2014-04-20: 3 [IU] via SUBCUTANEOUS

## 2014-04-20 MED ORDER — GLIPIZIDE 10 MG PO TABS
10.0000 mg | ORAL_TABLET | Freq: Two times a day (BID) | ORAL | Status: DC
  Filled 2014-04-20 (×2): qty 1

## 2014-04-20 MED ORDER — ISOSORBIDE MONONITRATE ER 30 MG PO TB24: 30.0000 mg | ORAL_TABLET | Freq: Every day | ORAL | Status: DC

## 2014-04-20 NOTE — Progress Notes (Signed)
Pt discharged home per MD. NSL out with cath intact. Discharge instructions reviewed. Advised pt to call Dr. Leo RodLucas's office in the morning for post hospital visit. Pt excorted via wheelchair and NT. Hortencia ConradiWendi Gracianna Vink, RN

## 2014-04-20 NOTE — Progress Notes (Signed)
CARE MANAGEMENT NOTE 04/20/2014  Patient:  [name]   Account Number:  [id number]  Date Initiated:  04/15/2014  Documentation initiated by:  [facility],SARAH  Subjective/Objective Assessment:   Worsening renal failure - hypotension - vent dependent     Action/Plan:   Anticipated DC Date:  04/20/2014   Anticipated DC Plan:  HOME W HOME HEALTH SERVICES      DC Planning Services  CM consult      [facility]   Choice offered to / List presented to:  C-1 Patient        HH arranged  HH-1 RN      [facility]   Status of service:  Completed, signed off Medicare Important Message given?  NO (If response is "NO", the following Medicare IM given date fields will be blank) Date Medicare IM given:   Medicare IM given by:   Date Additional Medicare IM given:   Additional Medicare IM given by:    Discharge Disposition:  HOME W HOME HEALTH SERVICES  Per UR Regulation:  Reviewed for med. necessity/level of care/duration of stay  If discussed at Long Length of Stay Meetings, dates discussed:    Comments:  04/20/2014 1600 Contacted Genia HotterBonnie Gibson # [phone]. [facility] Fax # [phone]. Cancelled referral with [facility]. Isidoro DonningAlesia Kinnie Kaupp RN CCM Case Mgmt phone [phone]  04/20/2014 1400 NCM spoke to pt and states she [facility] Pt states she would like to use [facility]. [facility] and [facility]. [facility]. [facility]. Isidoro DonningAlesia Savilla Turbyfill RN CCM Case Mgmt phone [phone]  Contact:  [name],[name] Spouse [phone]

## 2014-04-20 NOTE — Progress Notes (Signed)
Physical Therapy Treatment Patient Details Name: [name] MRN: [id number] DOB: 1965-12-19 Today's Date: 04/20/2014    History of Present Illness Pt admit with SBO and VDRF.      PT Comments    Very good progress with mobility; able to walk to bathroom with min assist; Continue to recommend 24 hour assistance at home  Follow Up Recommendations  Home health PT;Supervision/Assistance - 24 hour     Equipment Recommendations  None recommended by PT    Recommendations for Other Services       Precautions / Restrictions Precautions Precautions: Fall Restrictions Weight Bearing Restrictions: No    Mobility  Bed Mobility Overal bed mobility: Needs Assistance Bed Mobility: Supine to Sit     Supine to sit: Mod assist     General bed mobility comments: min assist to clear feet from EOB, then 1 person handheld pullup assist  Transfers Overall transfer level: Needs assistance Equipment used: Rolling walker (2 wheeled) Transfers: Sit to/from Stand Sit to Stand: Mod assist;Min assist         General transfer comment: Min assist to power up today from bed and commode  Ambulation/Gait Ambulation/Gait assistance: Min assist (second person readying the chair for safety) Ambulation Distance (Feet): 15 Feet Assistive device: Rolling walker (2 wheeled) Gait Pattern/deviations: Shuffle;Trunk flexed;Wide base of support     General Gait Details: Pt quite anxious during walk, but able to move with RW with min assist   Stairs            Wheelchair Mobility    Modified Rankin (Stroke Patients Only)       Balance Overall balance assessment: Needs assistance   Sitting balance-Leahy Scale: Fair     Standing balance support: Bilateral upper extremity supported Standing balance-Leahy Scale: Poor Standing balance comment: continues to need UE suport                    Cognition Arousal/Alertness: Awake/alert Behavior During Therapy: WFL for tasks  assessed/performed;Anxious Overall Cognitive Status: Within Functional Limits for tasks assessed                      Exercises      General Comments        Pertinent Vitals/Pain No specific reports of pain, but quite anxious with walking patient repositioned for comfort and encouraged slow, controlled breathing     Home Living                      Prior Function            PT Goals (current goals can now be found in the care plan section) Acute Rehab PT Goals Patient Stated Goal: to go home PT Goal Formulation: With patient Time For Goal Achievement: 04/24/14 Potential to Achieve Goals: Good Progress towards PT goals: Progressing toward goals    Frequency  Min 3X/week    PT Plan Current plan remains appropriate    Co-evaluation             End of Session Equipment Utilized During Treatment: Gait belt Activity Tolerance: Patient tolerated treatment well;Other (comment) (Though quite anxious) Patient left: in chair;with call bell/phone within reach     Time: 0848-0920 PT Time Calculation (min): 32 min  Charges:  $Gait Training: 8-22 mins $Therapeutic Activity: 8-22 mins                    G Codes:  Van ClinesGarrigan, Charolette Bultman [facility] 04/20/2014, 11:36 AM  Van ClinesHolly Tamotsu Wiederholt, PT  Acute Rehabilitation Services Pager [phone] Office [phone]

## 2014-04-20 NOTE — Progress Notes (Signed)
CARE MANAGEMENT NOTE 04/20/2014  Patient:  [name],Chattie F   Account Number:  [id number]  Date Initiated:  04/15/2014  Documentation initiated by:  [facility]  Subjective/Objective Assessment:   Worsening renal failure - hypotension - vent dependent     Action/Plan:   Anticipated DC Date:  04/20/2014   Anticipated DC Plan:  HOME W HOME HEALTH SERVICES      DC Planning Services  CM consult      [facility]   Choice offered to / List presented to:  C-1 Patient        HH arranged  HH-1 RN      [facility]  [facility]   Status of service:  Completed, signed off Medicare Important Message given?  NO (If response is "NO", the following Medicare IM given date fields will be blank) Date Medicare IM given:   Medicare IM given by:   Date Additional Medicare IM given:   Additional Medicare IM given by:    Discharge Disposition:  HOME W HOME HEALTH SERVICES  Per UR Regulation:  Reviewed for med. necessity/level of care/duration of stay  If discussed at Long Length of Stay Meetings, dates discussed:    Comments:  04/20/2014 1400 NCM spoke to pt and states [facility]. Pt states she would like to use [facility]. [facility] and [facility]. Pt [facility]. Notified [facility] RN. Isidoro DonningAlesia Mabry Tift RN CCM Case Mgmt phone [phone]  Contact:  Tildon Huskyiznado,Justo Spouse [phone]

## 2014-04-21 LAB — CULTURE, BLOOD (ROUTINE X 2)
Culture: NO GROWTH
Culture: NO GROWTH

## 2014-04-21 NOTE — Discharge Summary (Signed)
Physician Discharge Summary  [name] ZOX:[id number] DOB: Feb 06, 1966 DOA: 04/11/2014  PCP: No primary provider on file.  Admit date: 04/11/2014 Discharge date: 04/21/2014  Time spent: 30 minutes  Recommendations for Outpatient Follow-up:  1. Follow up with PCP in one week.   Discharge Diagnoses:  Principal Problem:   SBO (small bowel obstruction) Active Problems:   Bipolar disorder   DM type 2 (diabetes mellitus, type 2)   Seizure disorder   ARF (acute renal failure)   Acute respiratory failure with hypoxia   Septic shock(785.52)   Discharge Condition: improved.   Diet recommendation: carb modified.   Filed Weights   04/18/14 2139 04/19/14 0500 04/19/14 2045  Weight: 147.85 kg (325 lb 15.2 oz) 147.85 kg (325 lb 15.2 oz) 148.326 kg (327 lb)    History of present illness:   48 yr F with PMH baseline MR, DM2, bipolar, anxiety, seizure disorder, HTN, COPD (home O2) who presented to [facility] ED on 04/11/14 for abdominal pain, n/v, CT abd with high-grade SBO (likely due to hx adhesions) resolved with NGT . She was found to have e coli UTI, completed the course of antibiotics. She developed ARF, probably secondary to a combination of volume depletion , hypotension with ACE inhibitor on board and IV contrast. Renal consulted and she was transferred to ICU and was under PCCM service, where she was started on vasopressors. Her renal function has improved markedly and her hypotension resolved. She was transferred back to [facility] service on 7/25 for further management.  Hospital Course:  1. High grade SBO;  - resolved with NGT and is on carb modified diet.  2. Acute respiratory failure secondary to hypercapnia and hypoxemia from possible copd :  Nebs as needed.  3. Questionable septic chock from UTI;  - off pressors on 7/23. Currently BP are stable.  4. Mild elevation of troponin:  - possibly from the demand ischemia From septic shock.  - echocardiogram ordered, done results show  grade 1 diastolic dysfunction.  5. Acute kidney injury:  Possibly from a combination of hypotension, ace inhibitors and volume depletion.  Back to baseline now.  6. Diabetes mellitus;  - SSI.  - resume lantus.  CBG (last 3)   Recent Labs   04/19/14 0629  04/19/14 0829  04/19/14 1052   GLUCAP  243*  290*  281*    7. Acute encephalopathy:  Probably from the above. Resolved. She is at her baseline on discharge.     SIGNIFICANT EVENTS / STUDIES:  7/18 - Admitted [facility]), CT Abd CT with SBO, NGT decompression  7/19 - 7/20 - Increasing Cr  7/21 - Renal US (no hydro, hypoechogenic R-mid-upper kidney, f/u imaging)  - Transfer to [facility] ICU given hypotension with AKI, inc Cr to 9.5, concern for CRRT  7/22 - Acutely encephalopathic, concern uremic, risk hyperK, required intubation >> mech vent, HD cath  - Emergent Renal consult o/n >> started CRRT (x 45 min), on Dopamine, Neo  - pH 7.2.96 (CO2 57.9) >> corrected to 7.616 (CO2 34.7) >> DC bicarb, adjust vent to reduce ventilation  - improved UOP (1.5L), determined critical K to 2.2, discontinued CRRT  7/23 - febrile to 103F, dec PCT 3.78 >> 0.75, improved Cr 5.59 >> 2.50, UOP 3.8L, Renal signed off  - CXR (R-lower atx, airspace unchanged, seems under penetrated)  - Extubated, removed HD cath / A-line  7/24 - Tolerating clear liquid diet, neg balance noted. Advanced diet .  7/25 pt transferred to [facility]  service and she is on carb modified diet.   Procedures:  NGT.  Consultations:  Pccm.   Discharge Exam: Filed Vitals:   04/20/14 1100  BP: 119/79  Pulse:   Temp:   Resp:     General: alert afebrile comfortable Cardiovascular: s1s2 Respiratory: ctab  Discharge Instructions You were cared for by a hospitalist during your hospital stay. If you have any questions about your discharge medications or the care you received while you were in the hospital after you are discharged, you can call the unit and asked to speak with the  hospitalist on call if the hospitalist that took care of you is not available. Once you are discharged, your primary care physician will handle any further medical issues. Please note that NO REFILLS for any discharge medications will be authorized once you are discharged, as it is imperative that you return to your primary care physician (or establish a relationship with a primary care physician if you do not have one) for your aftercare needs so that they can reassess your need for medications and monitor your lab values.  Discharge Instructions   Diet - low sodium heart healthy    Complete by:  As directed      Discharge instructions    Complete by:  As directed   Follow up with PCP in one week            Medication List    STOP taking these medications       ibuprofen 800 MG tablet  Commonly known as:  ADVIL,MOTRIN     lisinopril 10 MG tablet  Commonly known as:  PRINIVIL,ZESTRIL      TAKE these medications       ARIPiprazole 30 MG tablet  Commonly known as:  ABILIFY  Take 30 mg by mouth every morning.     aspirin EC 81 MG tablet  Take 81 mg by mouth every morning.     calcium carbonate 600 MG Tabs tablet  Commonly known as:  OS-CAL  Take 600 mg by mouth 2 (two) times daily with a meal.     clonazePAM 1 MG tablet  Commonly known as:  KLONOPIN  Take 1 mg by mouth 2 (two) times daily.     divalproex 250 MG DR tablet  Commonly known as:  DEPAKOTE  Take 1,500 mg by mouth at bedtime.     docusate sodium 100 MG capsule  Commonly known as:  COLACE  Take 100 mg by mouth 3 (three) times daily.     gabapentin 300 MG capsule  Commonly known as:  NEURONTIN  Take 1 capsule (300 mg total) by mouth 3 (three) times daily.     gemfibrozil 600 MG tablet  Commonly known as:  LOPID  Take 600 mg by mouth 2 (two) times daily before a meal.     glipiZIDE 10 MG tablet  Commonly known as:  GLUCOTROL  Take 10 mg by mouth 2 (two) times daily before a meal.     insulin aspart 100  UNIT/ML injection  Commonly known as:  novoLOG  - CBG < 70: implement hypoglycemia protocol  - CBG 70 - 120: 0 units  - CBG 121 - 150: 3 units  - CBG 151 - 200: 4 units  - CBG 201 - 250: 7 units  - CBG 251 - 300: 11 units  - CBG 301 - 350: 15 units  - CBG 351 - 400: 20 units     insulin glargine 100  UNIT/ML injection  Commonly known as:  LANTUS  Inject 0.6 mLs (60 Units total) into the skin daily.     isosorbide mononitrate 30 MG 24 hr tablet  Commonly known as:  IMDUR  Take 1 tablet (30 mg total) by mouth daily.     metoprolol succinate 50 MG 24 hr tablet  Commonly known as:  TOPROL-XL  Take 50 mg by mouth every morning.     multivitamins ther. w/minerals Tabs tablet  Take 1 tablet by mouth every morning.     oxybutynin 5 MG tablet  Commonly known as:  DITROPAN  Take 5 mg by mouth every morning.     repaglinide 0.5 MG tablet  Commonly known as:  PRANDIN  Take 0.5 mg by mouth 3 (three) times daily before meals.     traMADol 50 MG tablet  Commonly known as:  ULTRAM  Take 50 mg by mouth at bedtime. For pain       No Known Allergies     Follow-up Information   Follow up with [facility]. [facility] RN )    Contact information:   [address] [place] [address] [phone]        The results of significant diagnostics from this hospitalization (including imaging, microbiology, ancillary and laboratory) are listed below for reference.    Significant Diagnostic Studies: Ct Abdomen Pelvis W Contrast  04/11/2014   CLINICAL DATA:  Abdominal pain with nausea and vomiting today. Left lower quadrant pain. Diabetes. Hemophilia. COPD. Parkinson's disease.  EXAM: CT ABDOMEN AND PELVIS WITH CONTRAST  TECHNIQUE: Multidetector CT imaging of the abdomen and pelvis was performed using the standard protocol following bolus administration of intravenous contrast.  CONTRAST:  50mL OMNIPAQUE IOHEXOL 300 MG/ML SOLN, 80mL OMNIPAQUE IOHEXOL 300  MG/ML SOLN  COMPARISON:  02/1912  FINDINGS: Lower chest: Mild right base volume loss. Normal heart size without pericardial or pleural effusion.  Liver:  Hepatomegaly, 20.3 cm.  No focal liver lesion.  Spleen: Normal  Stomach: Mildly distended and contrast filled.  Pancreas: Normal, without mass or pancreatic ductal dilatation.  Gallbladder/Biliary Tree: Borderline gallbladder distention, without pericholecystic edema. No biliary ductal dilatation.  Kidneys/Adrenals: Normal adrenal glands. Upper pole left renal lesion which measures 15 HU and 2.3 cm. Likely a cyst or minimally complex cyst. Too small to characterize lesions in both kidneys.  Bowel loops: Decompressed colon and distal small bowel.  The duodenum, proximal to mid small bowel are moderately dilated and fluid/ contrast filled. Index jejunal loop measures 3.7 cm on image 46. A "Small bowel feces sign" is identified on image 71. The bowel just distal to this site is decompressed. Example image 62. Transition is identified deep to the more inferior of 2 fat containing umbilical hernias. No underlying mass. No findings to suggest complicating ischemia. No bowel wall thickening or significant mesenteric edema. Surgical clips in the mid abdomen.  Vascular: Age advanced aortic and branch vessel atherosclerosis.  Nodes: No retroperitoneal or retrocrural adenopathy. No pelvic adenopathy.  Pelvic Genitourinary: Hysterectomy. Decompressed urinary bladder. No adnexal mass.  Other: No significant free fluid. Two fat containing ventral abdominal wall/umbilical hernias.  Bones/Musculoskeletal: Advanced lumbar spondylosis.  IMPRESSION: 1. High-grade partial small bowel obstruction to the level of the mid small bowel, likely secondary to adhesions. No evidence of complicating ischemia. Given the extent of gastric and proximal duodenal dilatation, the patient would likely benefit from nasogastric tube placement. 2. Age advanced aortic atherosclerosis. 3. Borderline  gallbladder distention, without specific evidence  of acute cholecystitis. 4. Hepatomegaly.   Electronically Signed   By: Jeronimo Greaves M.D.   On: 04/11/2014 20:09   US Renal  04/14/2014   CLINICAL DATA:  48 year old female with acute renal insufficiency.  EXAM: RENAL/URINARY TRACT ULTRASOUND COMPLETE  COMPARISON:  04/11/2014 and prior CTs dating back to 09/12/2010. 04/11/2007 abdominal ultrasound.  FINDINGS: Right Kidney:  Length: 11.9 cm. Upper limits of normal renal echogenicity noted. A 1.2 x 1.4 cm hypoechoic area within the mid -upper right kidney is indeterminate. There is no evidence of hydronephrosis.  Left Kidney:  Length: 13 cm. Upper limits of normal renal echogenicity noted. A 1.2 x 0.9 x 1.3 cm lower pole cyst is noted. No mass or hydronephrosis visualized.  Bladder:  Appears normal for degree of bladder distention.  IMPRESSION: Upper limits of normal renal echogenicity bilaterally which can be seen with medical renal disease. No evidence of hydronephrosis.  1.2 x 1.4 cm hypoechoic area within the mid-upper right kidney - unable to characterize further on this ultrasound study. Consider follow-up with elective abdominal MRI with and without contrast.   Electronically Signed   By: Laveda Abbe M.D.   On: 04/14/2014 11:47   Dg Chest Port 1 View  04/16/2014   CLINICAL DATA:  Intubation.  EXAM: PORTABLE CHEST - 1 VIEW  COMPARISON:  04/15/2014.  FINDINGS: Endotracheal tube and right IJ line in stable position. NG tube noted with tip below the left hemidiaphragm. Cardiomegaly. Bilateral pulmonary alveolar infiltrates. Right base atelectasis. No pneumothorax. No acute osseus abnormality.  IMPRESSION: 1. Interim placement of NG tube, its tip is below the hemidiaphragm. Endotracheal tube and right IJ line in stable position. 2. Development of new bilateral pulmonary alveolar infiltrates. New onset right base atelectasis. 3. Cardiomegaly. Component of congestive heart failure may be present.   Electronically  Signed   By: Maisie Fus  Register   On: 04/16/2014 07:28   Dg Chest Port 1 View  04/15/2014   CLINICAL DATA:  Assess endotracheal tube placement and central line placement.  EXAM: PORTABLE CHEST - 1 VIEW  COMPARISON:  Chest radiograph from 04/21/2007  FINDINGS: The patient's endotracheal tube is seen ending 4-5 cm above the carina. A right IJ sheath is seen ending about the proximal SVC.  The lungs are mildly hypoexpanded. Vascular crowding and mild vascular congestion are seen. No definite pleural effusion or pneumothorax is seen.  The cardiomediastinal silhouette is borderline normal in size. No acute osseous abnormalities are identified.  IMPRESSION: 1. Endotracheal tube seen ending 4-5 cm above the carina. 2. Right IJ sheath seen ending about the proximal SVC. 3. Lungs mildly hypoexpanded but grossly clear. Mild vascular congestion seen.   Electronically Signed   By: Roanna Raider M.D.   On: 04/15/2014 01:57   Dg Abd 2 Views  04/12/2014   CLINICAL DATA:  Small bowel obstruction.  EXAM: ABDOMEN - 2 VIEW  COMPARISON:  CT topogram on 04/11/2014  FINDINGS: A new nasogastric tube is seen with tip overlying the pylorus or proximal duodenum. Mild decrease in dilated small bowel loops seen since prior study. Multiple surgical clips again seen within the right abdomen and pelvis. No free air identified.  IMPRESSION: Mild decrease in small bowel dilatation. Nasogastric tube tip overlies the pylorus or proximal duodenum.   Electronically Signed   By: Myles Rosenthal M.D.   On: 04/12/2014 12:01   Dg Abd Portable 1v  04/15/2014   CLINICAL DATA:  Orogastric tube placement.  EXAM: PORTABLE ABDOMEN - 1 VIEW  COMPARISON:  Abdominal radiograph performed earlier today at 1:47 a.m.  FINDINGS: The patient's orogastric tube is seen ending either at the pylorus or in the first segment of the duodenum.  The visualized bowel gas pattern is grossly unremarkable. The stomach is partially filled with air. Scattered air-filled loops of  small and large bowel are noted. There is no evidence for bowel obstruction. No free intra-abdominal air is identified, though evaluation for free air is suboptimal on supine views. No acute osseous abnormalities are identified.  IMPRESSION: Orogastric tube seen ending either at the pylorus or in the first segment of the duodenum.   Electronically Signed   By: Roanna Raider M.D.   On: 04/15/2014 04:43   Dg Abd Portable 1v  04/15/2014   CLINICAL DATA:  Abdominal bloating.  EXAM: PORTABLE ABDOMEN - 1 VIEW  COMPARISON:  Abdominal radiograph performed 04/12/2014, and CT of the abdomen and pelvis from 04/11/2014  FINDINGS: The visualized bowel gas pattern is grossly unremarkable, with scattered air filled loops of small and large bowel. The stomach is partially filled with air. No small bowel dilatation is seen to suggest obstruction; previously noted bowel dilatation has resolved. No free intra-abdominal air is identified, though evaluation for free air is limited on a single supine view.  No acute osseous abnormalities are identified. Mild degenerative change is noted along the lower lumbar spine. Postoperative change is noted at the upper pelvis.  IMPRESSION: Unremarkable bowel gas pattern; no free intra-abdominal air seen. Air now seen extending into the colon, suggesting interval resolution of bowel obstruction.   Electronically Signed   By: Roanna Raider M.D.   On: 04/15/2014 02:02    Microbiology: Recent Results (from the past 240 hour(s))  MRSA PCR SCREENING     Status: None   Collection Time    04/14/14 11:49 PM      Result Value Ref Range Status   MRSA by PCR NEGATIVE  NEGATIVE Final   Comment:            The GeneXpert MRSA Assay (FDA     approved for NASAL specimens     only), is one component of a     comprehensive MRSA colonization     surveillance program. It is not     intended to diagnose MRSA     infection nor to guide or     monitor treatment for     MRSA infections.  CULTURE,  BLOOD (ROUTINE X 2)     Status: None   Collection Time    04/15/14  3:50 AM      Result Value Ref Range Status   Specimen Description BLOOD RIGHT HAND   Final   Special Requests BOTTLES DRAWN AEROBIC ONLY 3CC   Final   Culture  Setup Time     Final   Value: 04/15/2014 10:04     Performed at [facility]   Culture     Final   Value: NO GROWTH 5 DAYS     Performed at [facility]   Report Status 04/21/2014 FINAL   Final  CULTURE, BLOOD (ROUTINE X 2)     Status: None   Collection Time    04/15/14  4:05 AM      Result Value Ref Range Status   Specimen Description BLOOD RIGHT WRIST   Final   Special Requests BOTTLES DRAWN AEROBIC ONLY 2CC   Final   Culture  Setup Time     Final   Value:  04/15/2014 10:04     [facility]   Culture     Final   Value: NO GROWTH 5 DAYS     Performed at [facility]   Report Status 04/21/2014 FINAL   Final  CLOSTRIDIUM DIFFICILE BY PCR     Status: None   Collection Time    04/18/14  3:50 PM      Result Value Ref Range Status   C difficile by pcr NEGATIVE  NEGATIVE Final     Labs: Basic Metabolic Panel:  Recent Labs Lab 04/15/14 2136 04/16/14 0500 04/16/14 0825 04/17/14 0520 04/17/14 1310 04/17/14 1407 04/17/14 1854 04/18/14 0530 04/19/14 0520  NA  --  141  --   --  144  --  141 145 144  K  --  4.2  --   --  4.3  --  4.2 4.1 4.7  CL  --  99  --   --  101  --  100 102 106  CO2  --  28  --   --  31  --  29 29 26   GLUCOSE  --  273*  --   --  195*  --  206* 231* 256*  BUN  --  53*  --   --  24*  --  22 17 13   CREATININE  --  2.50*  --   --  1.15*  --  1.05 0.98 0.97  CALCIUM  --  8.4  --   --  9.0  --  9.0 8.7 8.8  MG 1.7  --  1.5 1.6  --  2.2  --   --  1.6  PHOS 3.2 2.5 2.2* 2.3  --  1.4*  --   --  2.4   Liver Function Tests:  Recent Labs Lab 04/14/14 2359 04/15/14 0238 04/16/14 0500 04/18/14 0530  AST 27  --   --  42*  ALT 10  --   --  29  ALKPHOS 47  --   --  69  BILITOT 0.4  --    --  0.2*  PROT 4.3*  --   --  5.8*  ALBUMIN 1.9* 2.9* 2.4* 2.4*   No results found for this basename: LIPASE, AMYLASE,  in the last 168 hours No results found for this basename: AMMONIA,  in the last 168 hours CBC:  Recent Labs Lab 04/15/14 0335 04/16/14 0500 04/19/14 0520  WBC 9.0 7.8 6.3  HGB 13.1 11.1* 11.3*  HCT 39.5 35.6* 36.5  MCV 85.3 89.4 89.5  PLT 211 162 178   Cardiac Enzymes:  Recent Labs Lab 04/15/14 0134 04/15/14 0330 04/15/14 1314  TROPONINI 0.32* <0.30 <0.30   BNP: BNP (last 3 results) No results found for this basename: PROBNP,  in the last 8760 hours CBG:  Recent Labs Lab 04/19/14 1052 04/19/14 1608 04/19/14 2038 04/20/14 0743 04/20/14 1256  GLUCAP 281* 254* 212* 162* 249*       Signed:  Irmgard Rampersaud  [facility] 04/21/2014, 11:37 PM

## 2015-05-22 IMAGING — CR DG ABD PORTABLE 1V
2 series · 2 of 2 positions shown · non-contrast
Comparison: Abdominal radiograph performed earlier today at [DATE]
a.m.

CLINICAL DATA: Orogastric tube placement.

EXAM:
PORTABLE ABDOMEN - 1 VIEW

[AP (1 of 2)]
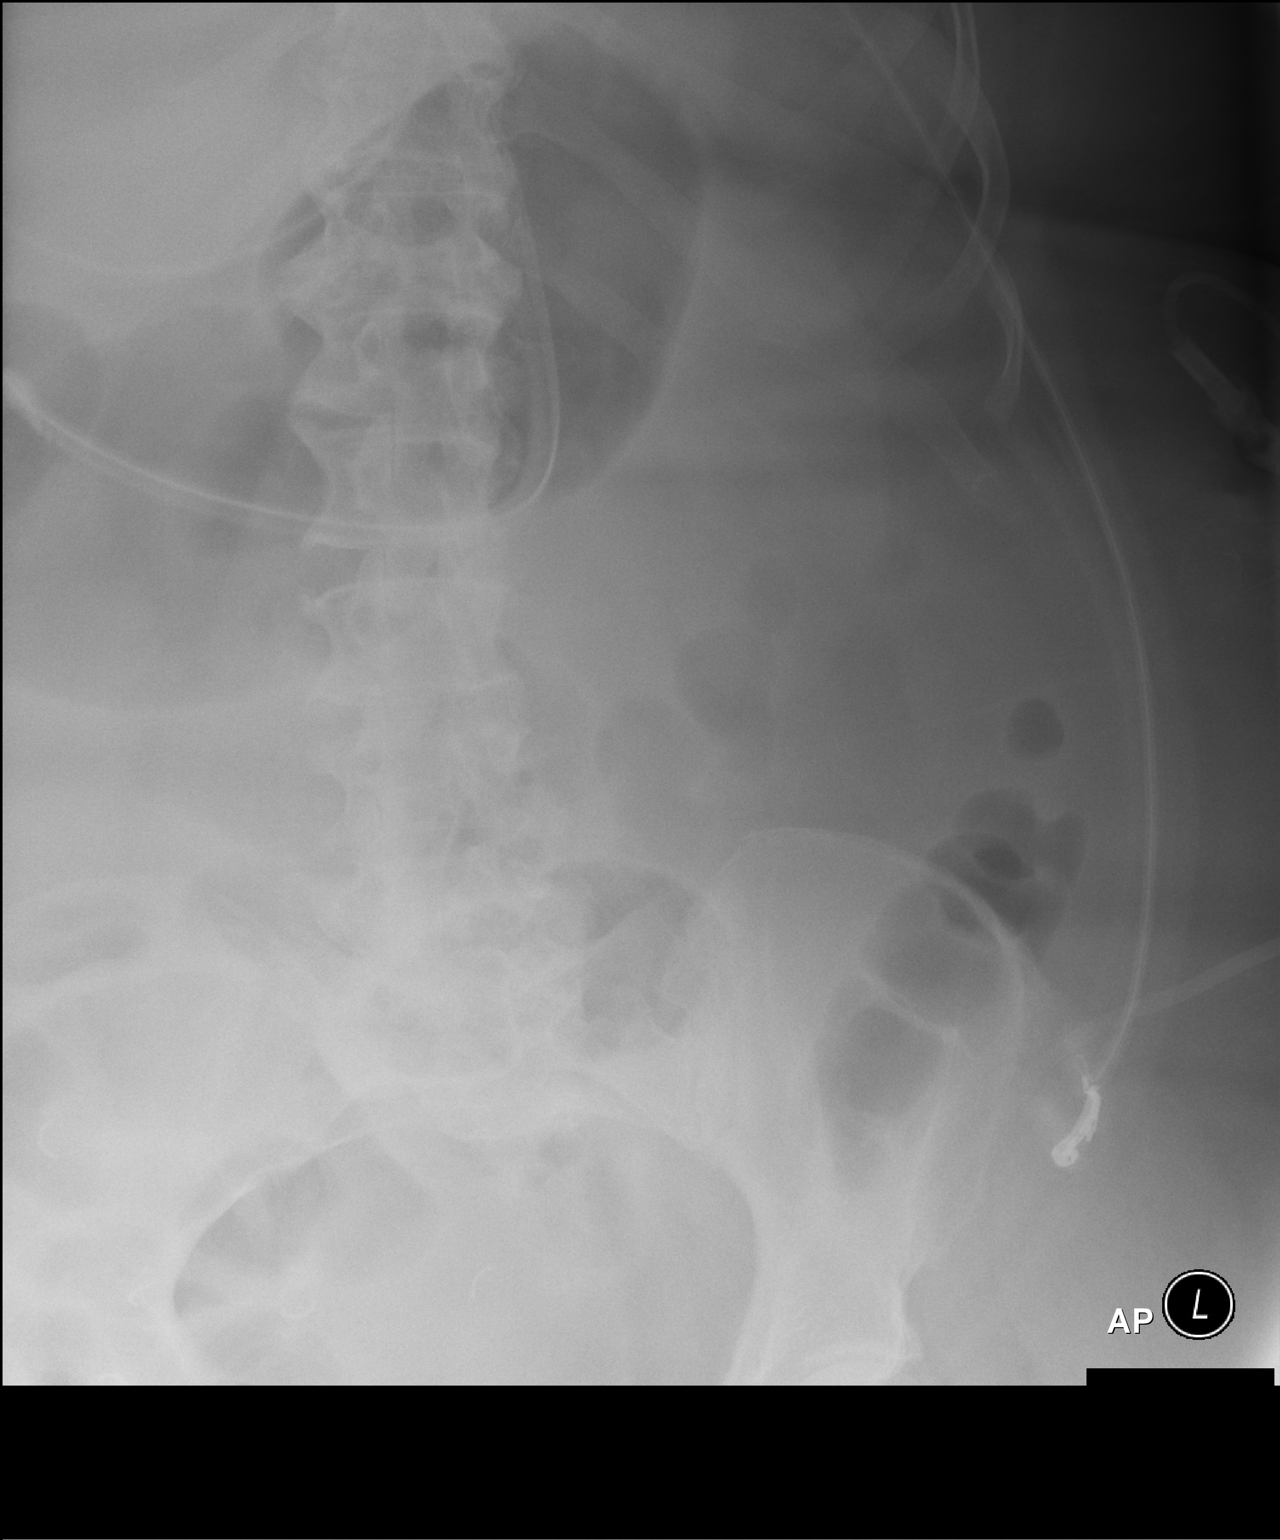

[AP (2 of 2)]
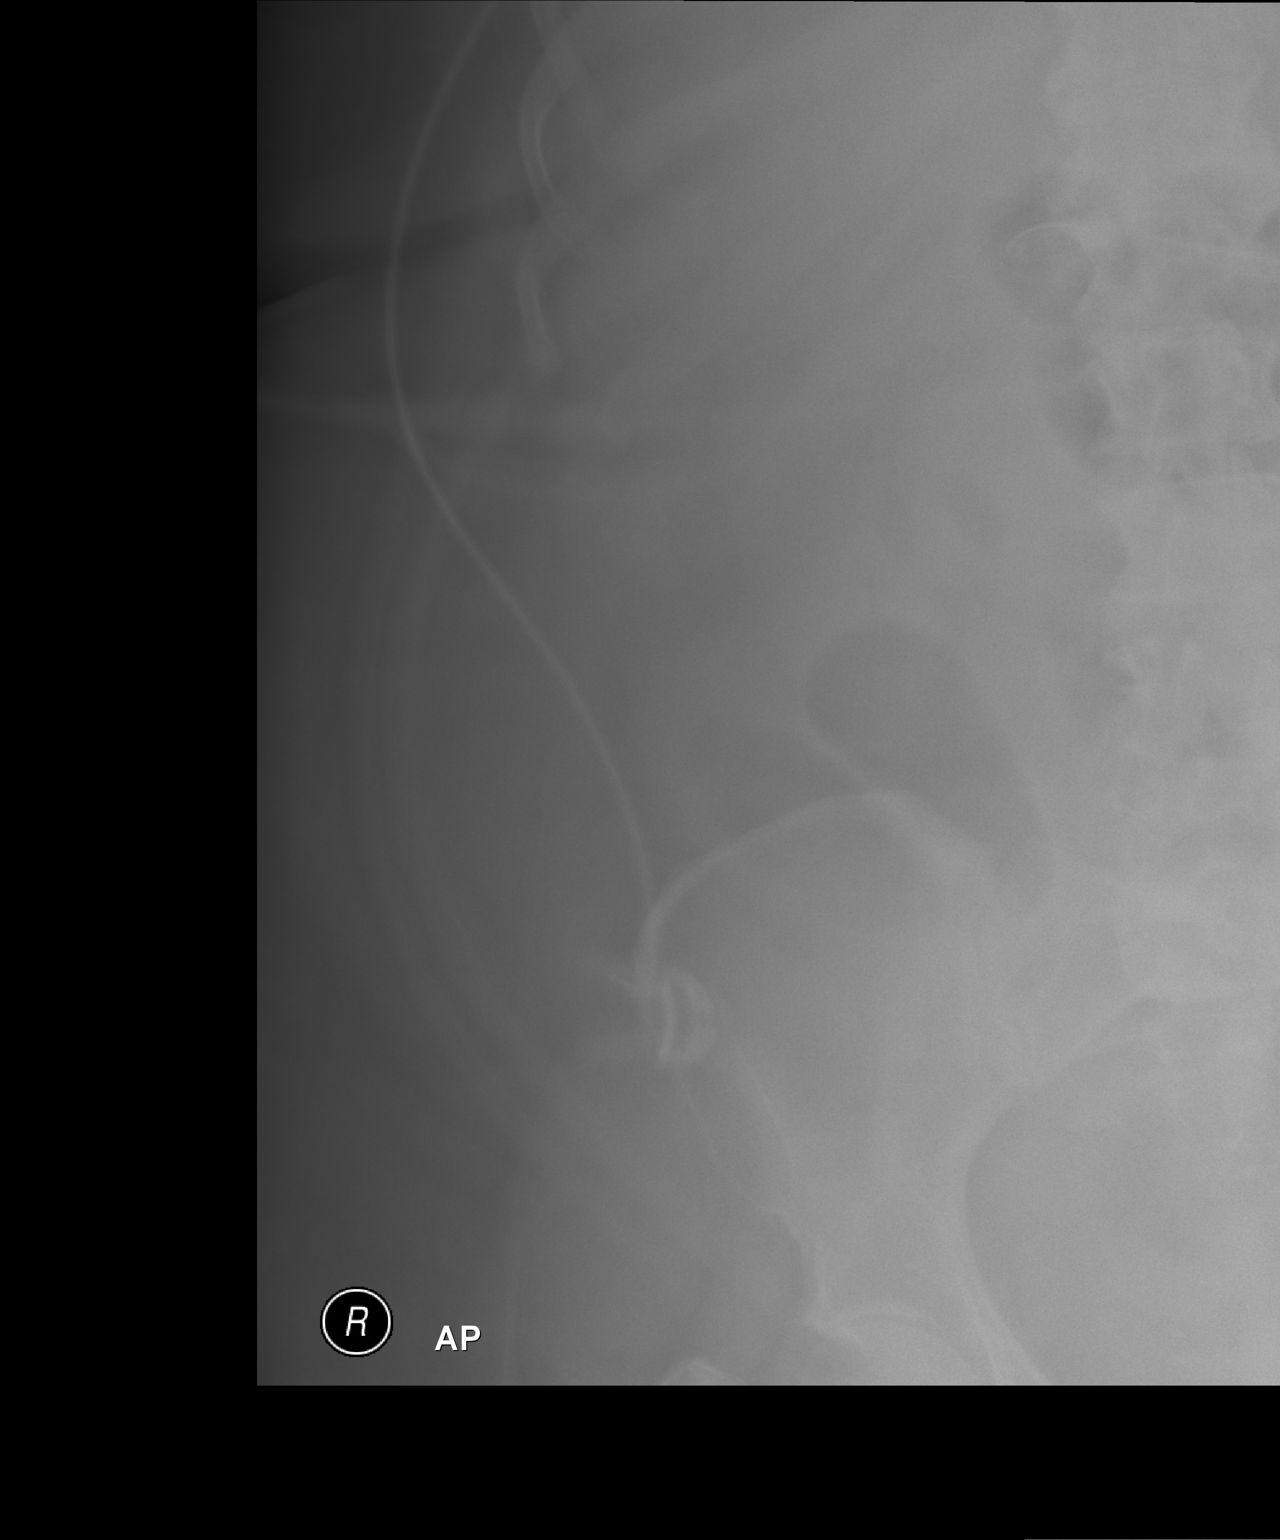

[2 of 2 positions shown; findings below may reference images not displayed]

FINDINGS: The patient's orogastric tube is seen ending either at the pylorus
or in the first segment of the duodenum.

The visualized bowel gas pattern is grossly unremarkable. The
stomach is partially filled with air. Scattered air-filled loops of
small and large bowel are noted. There is no evidence for bowel
obstruction. No free intra-abdominal air is identified, though
evaluation for free air is suboptimal on supine views. No acute
osseous abnormalities are identified.
IMPRESSION: Orogastric tube seen ending either at the pylorus or in the first
segment of the duodenum.

## 2015-05-22 IMAGING — CR DG CHEST 1V PORT
1 series · 1 of 1 positions shown · non-contrast
Comparison: Chest radiograph from 04/21/2007

CLINICAL DATA: Assess endotracheal tube placement and central line
placement.

EXAM:
PORTABLE CHEST - 1 VIEW

[AP]
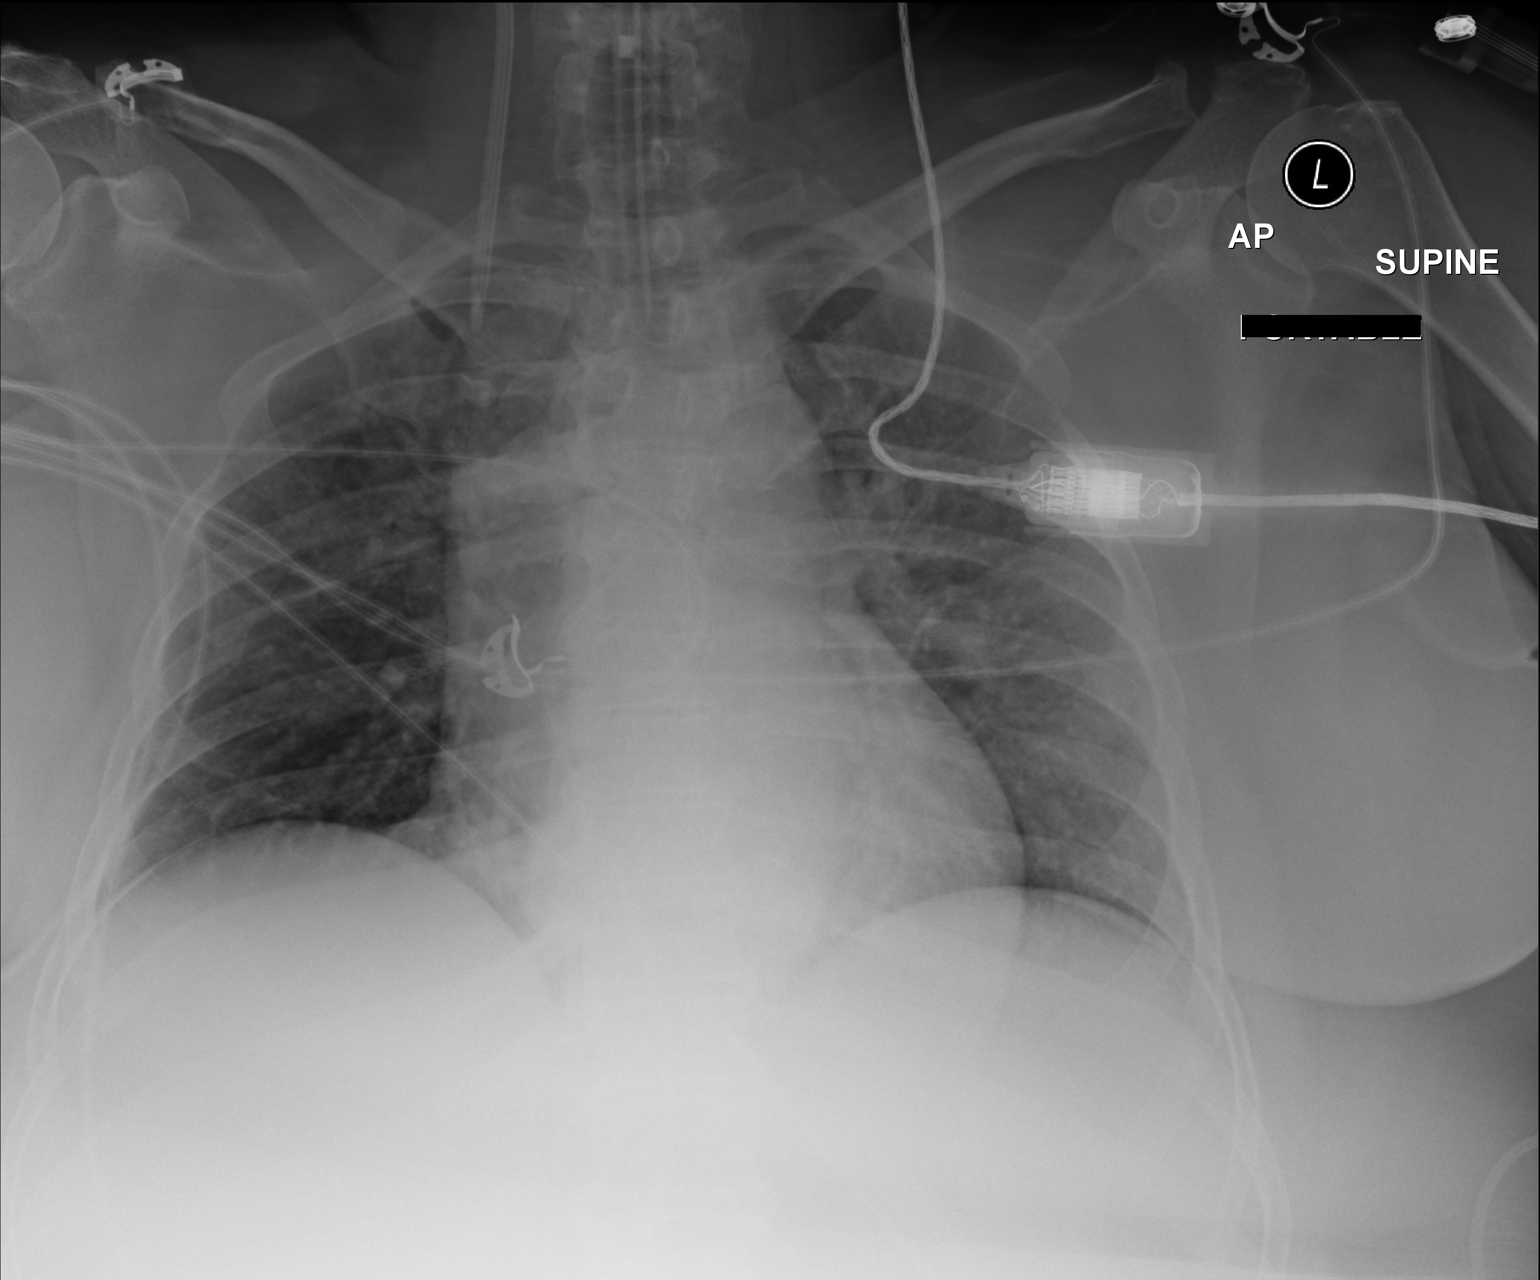

[1 of 1 positions shown; findings below may reference images not displayed]

FINDINGS: The patient's endotracheal tube is seen ending 4-5 cm above the
carina. A right IJ sheath is seen ending about the proximal SVC.

The lungs are mildly hypoexpanded. Vascular crowding and mild
vascular congestion are seen. No definite pleural effusion or
pneumothorax is seen.

The cardiomediastinal silhouette is borderline normal in size. No
acute osseous abnormalities are identified.
IMPRESSION: 1. Endotracheal tube seen ending 4-5 cm above the carina.
2. Right IJ sheath seen ending about the proximal SVC.
3. Lungs mildly hypoexpanded but grossly clear. Mild vascular
congestion seen.

## 2015-08-13 ENCOUNTER — Other Ambulatory Visit (HOSPITAL_COMMUNITY): Payer: Self-pay | Admitting: Respiratory Therapy

## 2015-08-13 DIAGNOSIS — R0602 Shortness of breath: Secondary | ICD-10-CM

## 2015-08-30 ENCOUNTER — Ambulatory Visit: Payer: Self-pay | Admitting: "Endocrinology

## 2015-11-10 ENCOUNTER — Encounter (HOSPITAL_COMMUNITY): Payer: Medicaid Other

## 2016-04-19 ENCOUNTER — Encounter (HOSPITAL_COMMUNITY): Payer: Medicaid Other

## 2017-05-01 ENCOUNTER — Emergency Department
Admission: EM | Admit: 2017-05-01 | Discharge: 2017-05-03 | Disposition: A | Discharge status: Home / Self Care | Payer: Medicaid Other | Attending: Emergency Medicine | Admitting: Emergency Medicine

## 2017-05-01 DIAGNOSIS — J449 Chronic obstructive pulmonary disease, unspecified: Secondary | ICD-10-CM | POA: Diagnosis not present

## 2017-05-01 DIAGNOSIS — E119 Type 2 diabetes mellitus without complications: Secondary | ICD-10-CM | POA: Diagnosis not present

## 2017-05-01 DIAGNOSIS — Z7982 Long term (current) use of aspirin: Secondary | ICD-10-CM | POA: Diagnosis not present

## 2017-05-01 DIAGNOSIS — R197 Diarrhea, unspecified: Secondary | ICD-10-CM | POA: Insufficient documentation

## 2017-05-01 DIAGNOSIS — I10 Essential (primary) hypertension: Secondary | ICD-10-CM | POA: Diagnosis not present

## 2017-05-01 DIAGNOSIS — Z794 Long term (current) use of insulin: Secondary | ICD-10-CM | POA: Diagnosis not present

## 2017-05-01 DIAGNOSIS — F419 Anxiety disorder, unspecified: Secondary | ICD-10-CM | POA: Diagnosis not present

## 2017-05-01 DIAGNOSIS — Z79899 Other long term (current) drug therapy: Secondary | ICD-10-CM | POA: Insufficient documentation

## 2017-05-01 DIAGNOSIS — G20 Parkinson's disease: Secondary | ICD-10-CM | POA: Diagnosis not present

## 2017-05-01 DIAGNOSIS — G40909 Epilepsy, unspecified, not intractable, without status epilepticus: Secondary | ICD-10-CM

## 2017-05-01 DIAGNOSIS — F4325 Adjustment disorder with mixed disturbance of emotions and conduct: Secondary | ICD-10-CM | POA: Diagnosis not present

## 2017-05-01 DIAGNOSIS — F319 Bipolar disorder, unspecified: Secondary | ICD-10-CM | POA: Diagnosis present

## 2017-05-01 LAB — COMPREHENSIVE METABOLIC PANEL
ALT: 27 U/L (ref 14–54)
AST: 43 U/L — ABNORMAL HIGH (ref 15–41)
Albumin: 3.5 g/dL (ref 3.5–5.0)
Alkaline Phosphatase: 97 U/L (ref 38–126)
Anion gap: 12 (ref 5–15)
BUN: 19 mg/dL (ref 6–20)
CHLORIDE: 99 mmol/L — AB (ref 101–111)
CO2: 27 mmol/L (ref 22–32)
CREATININE: 1.03 mg/dL — AB (ref 0.44–1.00)
Calcium: 9.5 mg/dL (ref 8.9–10.3)
GFR calc Af Amer: >60 mL/min (ref >60)
GFR calc non Af Amer: >60 mL/min (ref >60)
Glucose, Bld: 435 mg/dL — ABNORMAL HIGH (ref 65–99)
Potassium: 3.9 mmol/L (ref 3.5–5.1)
SODIUM: 138 mmol/L (ref 135–145)
Total Bilirubin: 0.5 mg/dL (ref 0.3–1.2)
Total Protein: 6.5 g/dL (ref 6.5–8.1)

## 2017-05-01 LAB — URINE DRUG SCREEN, QUALITATIVE (ARMC ONLY)
Amphetamines, Ur Screen: NOT DETECTED
Barbiturates, Ur Screen: NOT DETECTED
Benzodiazepine, Ur Scrn: NOT DETECTED
CANNABINOID 50 NG, UR ~~LOC~~: NOT DETECTED
COCAINE METABOLITE, UR ~~LOC~~: NOT DETECTED
MDMA (ECSTASY) UR SCREEN: NOT DETECTED
Methadone Scn, Ur: NOT DETECTED
Opiate, Ur Screen: NOT DETECTED
Phencyclidine (PCP) Ur S: NOT DETECTED
Tricyclic, Ur Screen: NOT DETECTED

## 2017-05-01 LAB — URINALYSIS, COMPLETE (UACMP) WITH MICROSCOPIC
Bilirubin Urine: NEGATIVE
Glucose, UA: >=500 mg/dL — AB
HGB URINE DIPSTICK: NEGATIVE
Ketones, ur: NEGATIVE mg/dL
Leukocytes, UA: NEGATIVE
Nitrite: NEGATIVE
Protein, ur: 100 mg/dL — AB
Specific Gravity, Urine: 1.023 (ref 1.005–1.030)
pH: 5 (ref 5.0–8.0)

## 2017-05-01 LAB — VALPROIC ACID LEVEL: Valproic Acid Lvl: 17 ug/mL — ABNORMAL LOW (ref 50.0–100.0)

## 2017-05-01 LAB — SALICYLATE LEVEL: Salicylate Lvl: <7 mg/dL (ref 2.8–30.0)

## 2017-05-01 LAB — CBC
HEMATOCRIT: 39.8 % (ref 35.0–47.0)
HEMOGLOBIN: 13 g/dL (ref 12.0–16.0)
MCH: 27.9 pg (ref 26.0–34.0)
MCHC: 32.8 g/dL (ref 32.0–36.0)
MCV: 85.2 fL (ref 80.0–100.0)
Platelets: 164 10*3/uL (ref 150–440)
RBC: 4.67 MIL/uL (ref 3.80–5.20)
RDW: 14 % (ref 11.5–14.5)
WBC: 4.4 10*3/uL (ref 3.6–11.0)

## 2017-05-01 LAB — GLUCOSE, CAPILLARY: GLUCOSE-CAPILLARY: 281 mg/dL — AB (ref 65–99)

## 2017-05-01 LAB — POCT PREGNANCY, URINE: PREG TEST UR: NEGATIVE

## 2017-05-01 LAB — ETHANOL: Alcohol, Ethyl (B): <5 mg/dL (ref <5)

## 2017-05-01 LAB — ACETAMINOPHEN LEVEL: Acetaminophen (Tylenol), Serum: <10 ug/mL — ABNORMAL LOW (ref 10–30)

## 2017-05-01 LAB — PREGNANCY, URINE: PREG TEST UR: NEGATIVE

## 2017-05-01 MED ORDER — SODIUM CHLORIDE 0.9 % IV BOLUS (SEPSIS)
1000.0000 mL | Freq: Once | INTRAVENOUS | Status: AC
  Administered 2017-05-01: 1000 mL via INTRAVENOUS

## 2017-05-01 NOTE — ED Triage Notes (Signed)
She arrives under IVC via [facility] from home  IVC papers report that the pt has not been taking her meds and that she has had a UTI recently and she has not been able to clean herself so her husband has been having to take care of her  Pt arrives alert and oriented  She denies suicidal ideation - she denies homicidal ideation  Denies AH/VH

## 2017-05-01 NOTE — ED Notes (Signed)
Pt. Changed into burgundy scrubs in room #20A.  Pt. Is unable to stand without assistance.  Pt. States she uses walker at home but has been weak lately.

## 2017-05-01 NOTE — ED Notes (Signed)
Bed side toilet in room due to patient inability to move safely in and out of room.

## 2017-05-01 NOTE — ED Notes (Signed)
Pt. Requested something to drink and eat.  Pt. Given crackers, apple sauce, ginger ale and water.

## 2017-05-01 NOTE — BH Assessment (Signed)
Assessment Note  [name] is an 51 y.o. female presenting to the ED under involuntary commitment, initiated by patient's husband.  According to the IVC, patient has not been taking her medications and is not able to take care her basic needs.  Patient states her husband is 52 years old.  She says that his health is getting frail and is getting tired of taking care of her.  She reports not taking her medications because she believes that her doctor is trying to poison her.  She is also under the delusion that her husband's supervisor is trying to have her admitted to the [facility] in [place].  She also believes that the woman who thinks she is patient's sister (but not really her sister), is trying to steal her husband.  Additionally, patient thinks that the security officer in the ED is coming into her room pretending to be the janitor.  Pt denies SI/HI and any auditory/visual hallucinations.  She denies any drug/alcohol use.  Diagnosis: Anxiety Disorder  Past Medical History:  Past Medical History:  Diagnosis Date  . Anxiety   . COPD (chronic obstructive pulmonary disease) (HCC)   . Diabetes mellitus   . Fear of    falling  . Hemophilia (HCC)   . Hypertension   . Panic attacks   . Parkinson's disease   . Seizures (HCC)     Past Surgical History:  Procedure Laterality Date  . ABDOMINAL HYSTERECTOMY    . OOPHORECTOMY      Family History: No family history on file.  Social History:  reports that she has never smoked. She has never used smokeless tobacco. She reports that she does not drink alcohol or use drugs.  Additional Social History:  Alcohol / Drug Use Pain Medications: See PTA Prescriptions: See PTA Over the Counter: See PTA History of alcohol / drug use?: No history of alcohol / drug abuse (Pt denies drug/alcohol use.)  CIWA: CIWA-Ar BP: (!) 141/88 Pulse Rate: (!) 111 COWS:    Allergies: No Known Allergies  Home Medications:  (Not in a hospital  admission)  OB/GYN Status:  No LMP recorded. Patient has had a hysterectomy.  General Assessment Data [facility] ED TTS Assessment: In system Is this a Tele or Face-to-Face Assessment?: Face-to-Face Is this an Initial Assessment or a Re-assessment for this encounter?: Initial Assessment Marital status: Married [place]: n/a Is patient pregnant?: No Pregnancy Status: No Living Arrangements: Spouse/significant other Can pt return to current living arrangement?: Yes Admission Status: Involuntary Is patient capable of signing voluntary admission?: Yes Referral Source: Self/Family/Friend Insurance type: Medicaid     Crisis Care Plan Living Arrangements: Spouse/significant other Legal Guardian: Other: (self) Name of Psychiatrist: none reported Name of Therapist: none reported  Education Status Is patient currently in school?: No Current Grade: na Highest grade of school patient has completed: na Name of school: na Contact person: na  Risk to self with the past 6 months Suicidal Ideation: No Has patient been a risk to self within the past 6 months prior to admission? : No Suicidal Intent: No Has patient had any suicidal intent within the past 6 months prior to admission? : No Is patient at risk for suicide?: No Suicidal Plan?: No Has patient had any suicidal plan within the past 6 months prior to admission? : No Access to Means: No What has been your use of drugs/alcohol within the last 12 months?: none identified Previous Attempts/Gestures: No How many times?: 0 Other  Self Harm Risks: none identified Triggers for Past Attempts: None known Intentional Self Injurious Behavior: None Family Suicide History: No Recent stressful life event(s): Other (Comment) Persecutory voices/beliefs?: Yes Depression: No Substance abuse history and/or treatment for substance abuse?: No Suicide prevention information given to non-admitted patients: Not  applicable  Risk to Others within the past 6 months Homicidal Ideation: No Does patient have any lifetime risk of violence toward others beyond the six months prior to admission? : No Thoughts of Harm to Others: No Current Homicidal Intent: No Current Homicidal Plan: No Access to Homicidal Means: No Identified Victim: none identified History of harm to others?: No Assessment of Violence: None Noted Violent Behavior Description: none identified Does patient have access to weapons?: No Criminal Charges Pending?: No Does patient have a court date: No Is patient on probation?: No  Psychosis Hallucinations: None noted Delusions: Persecutory  Mental Status Report Appearance/Hygiene: In scrubs Eye Contact: Good Motor Activity: Freedom of movement Speech: Logical/coherent Level of Consciousness: Alert Mood: Anxious Affect: Appropriate to circumstance, Anxious Anxiety Level: Minimal Thought Processes: Relevant Judgement: Unimpaired Orientation: Person, Place, Time, Situation Obsessive Compulsive Thoughts/Behaviors: Minimal  Cognitive Functioning Concentration: Normal Memory: Recent Intact, Remote Intact IQ: Average Insight: Good Impulse Control: Good Appetite: Good Weight Loss: 0 Weight Gain: 0 Sleep: No Change Vegetative Symptoms: None  ADLScreening [facility] Assessment Services) Patient's cognitive ability adequate to safely complete daily activities?: Yes Patient able to express need for assistance with ADLs?: Yes Independently performs ADLs?: No (Pt needs assistance with toileting)  Prior Inpatient Therapy Prior Inpatient Therapy: No Prior Therapy Dates: na Prior Therapy Facilty/Provider(s): na Reason for Treatment: na  Prior Outpatient Therapy Prior Outpatient Therapy: No Prior Therapy Dates: na Prior Therapy Facilty/Provider(s): na Reason for Treatment: na Does patient have an ACCT team?: No Does patient have Intensive In-House Services?  : No Does patient  have [facility] services? : No Does patient have P4CC services?: No  ADL Screening (condition at time of admission) Patient's cognitive ability adequate to safely complete daily activities?: Yes Patient able to express need for assistance with ADLs?: Yes Independently performs ADLs?: No (Pt needs assistance with toileting)       Abuse/Neglect Assessment (Assessment to be complete while patient is alone) Physical Abuse: Denies Verbal Abuse: Denies Sexual Abuse: Denies Exploitation of patient/patient's resources: Denies Self-Neglect: Denies Values / Beliefs Cultural Requests During Hospitalization: None Spiritual Requests During Hospitalization: None Consults Spiritual Care Consult Needed: No Social Work Consult Needed: No Merchant navy officer (For Healthcare) Does Patient Have a Medical Advance Directive?: No Would patient like information on creating a medical advance directive?: No - Patient declined    Additional Information 1:1 In Past 12 Months?: No CIRT Risk: No Elopement Risk: No Does patient have medical clearance?: Yes     Disposition:  Disposition Initial Assessment Completed for this Encounter: Yes Disposition of Patient: Other dispositions Other disposition(s): Other (Comment) (Pending Psych MD consult)  On Site Evaluation by:   Reviewed with Physician:    [place] 05/01/2017 9:57 PM

## 2017-05-01 NOTE — ED Notes (Signed)
With a tech assisted pt. From bed to bed side toilet.  Pt. Urinated, but was unable to give stool sample.

## 2017-05-01 NOTE — ED Provider Notes (Signed)
[facility] Emergency Department Provider Note   ____________________________________________   First MD Initiated Contact with Patient 05/01/17 1912     (approximate)  I have reviewed the triage vital signs and the nursing notes.   HISTORY  Chief Complaint Anxiety    HPI [name] is a 51 y.o. female who comes in under commitment. She has been having a lot of diarrhea and is incontinent of stool and also having urinary incontinence. Per her commitment papers [facility] is [facility]. Patient tells me [facility]. But she is blaming her doctor says she's having her diarrhea and urinary incontinence because of morphine and stool softeners. She says she is messing up everything and unable to care for herself and her husband is unable to care for her as well. Patient denies any fever or abdominal pain at present. His exam to stand her she has cramps before she has diarrhea.   Past Medical History:  Diagnosis Date  . Anxiety   . COPD (chronic obstructive pulmonary disease) (HCC)   . Diabetes mellitus   . Fear of    falling  . Hemophilia (HCC)   . Hypertension   . Panic attacks   . Parkinson's disease   . Seizures [facility](HCC)     Patient Active Problem List   Diagnosis Date Noted  . Acute respiratory failure with hypoxia (HCC) 04/15/2014  . Septic shock(785.52) 04/15/2014  . ARF (acute renal failure) (HCC) 04/12/2014  . SBO (small bowel obstruction) (HCC) 04/11/2014  . Bipolar disorder (HCC) 04/11/2014  . DM type 2 (diabetes mellitus, type 2) (HCC) 04/11/2014  . Seizure disorder (HCC) 04/11/2014  . DEGENERATIVE JOINT DISEASE, RIGHT KNEE 07/13/2008  . KNEE PAIN 07/13/2008    Past Surgical History:  Procedure Laterality Date  . ABDOMINAL HYSTERECTOMY    . OOPHORECTOMY      Prior to Admission medications   Medication Sig Start Date End Date Taking? Authorizing Provider  ARIPiprazole (ABILIFY) 30 MG tablet  Take 30 mg by mouth every morning.      [provider]  aspirin EC 81 MG tablet Take 81 mg by mouth every morning.      [provider]  calcium carbonate (OS-CAL) 600 MG TABS Take 600 mg by mouth 2 (two) times daily with a meal.      [provider]  clonazePAM (KLONOPIN) 1 MG tablet Take 1 mg by mouth 2 (two) times daily.     [provider]  divalproex (DEPAKOTE) 250 MG DR tablet Take 1,500 mg by mouth at bedtime.     [provider]  docusate sodium (COLACE) 100 MG capsule Take 100 mg by mouth 3 (three) times daily.      [provider]  gabapentin (NEURONTIN) 300 MG capsule Take 1 capsule (300 mg total) by mouth 3 (three) times daily. 04/20/14   Kathlen ModyAkula, Vijaya, MD  gemfibrozil (LOPID) 600 MG tablet Take 600 mg by mouth 2 (two) times daily before a meal.      [provider]  glipiZIDE (GLUCOTROL) 10 MG tablet Take 10 mg by mouth 2 (two) times daily before a meal.      [provider]  insulin aspart (NOVOLOG) 100 UNIT/ML injection CBG < 70: implement hypoglycemia protocol CBG 70 - 120: 0 units CBG 121 - 150: 3 units CBG 151 - 200: 4 units CBG 201 - 250: 7 units CBG 251 - 300: 11 units CBG  301 - 350: 15 units CBG 351 - 400: 20 units 04/20/14   Kathlen Mody, MD  insulin glargine (LANTUS) 100 UNIT/ML injection Inject 0.6 mLs (60 Units total) into the skin daily. 04/20/14   Kathlen Mody, MD  isosorbide mononitrate (IMDUR) 30 MG 24 hr tablet Take 1 tablet (30 mg total) by mouth daily. 04/20/14   Kathlen Mody, MD  metoprolol (TOPROL-XL) 50 MG 24 hr tablet Take 50 mg by mouth every morning.     [provider]  Multiple Vitamins-Minerals (MULTIVITAMINS THER. W/MINERALS) TABS Take 1 tablet by mouth every morning.      [provider]  oxybutynin (DITROPAN) 5 MG tablet Take 5 mg by mouth every morning.      [provider]  repaglinide (PRANDIN) 0.5 MG tablet Take 0.5 mg by mouth 3 (three) times daily  before meals.    [provider]  traMADol (ULTRAM) 50 MG tablet Take 50 mg by mouth at bedtime. For pain    [provider]    Allergies Patient has no known allergies.  No family history on file.  Social History Social History  Substance Use Topics  . Smoking status: Never Smoker  . Smokeless tobacco: Never Used  . Alcohol use No    Review of Systems  Constitutional: No fever/chills Eyes: No visual changes. ENT: No sore throat. Cardiovascular: Denies chest pain. Respiratory: Denies shortness of breath. Gastrointestinal: See history of present illness Genitourinary: Negative for dysuria. Musculoskeletal: Negative for back pain. Skin: Negative for rash. Neurological: Negative for headaches, focal weakness o ____________________________________________   PHYSICAL EXAM:  VITAL SIGNS: ED Triage Vitals  Enc Vitals Group     BP 05/01/17 1900 (!) 141/88     Pulse Rate 05/01/17 1900 (!) 111     Resp 05/01/17 1900 16     Temp 05/01/17 1900 98.5 F (36.9 C)     Temp Source 05/01/17 1900 Oral     SpO2 05/01/17 1900 96 %     Weight 05/01/17 1858 275 lb (124.7 kg)     Height 05/01/17 1858 5\' 5"  (1.651 m)     Head Circumference --      Peak Flow --      Pain Score --      Pain Loc --      Pain Edu? --      Excl. in GC? --     Constitutional: Alert and oriented. Well appearing and in no acute distress. Eyes: Conjunctivae are normal. Head: Atraumatic. Nose: No congestion/rhinnorhea. Mouth/Throat: Mucous membranes are moist.  Oropharynx non-erythematous. Neck: No stridor. Cardiovascular: Normal rate, regular rhythm. Grossly normal heart sounds.  Good peripheral circulation. Respiratory: Normal respiratory effort.  No retractions. Lungs CTAB. Gastrointestinal: Soft and nontender. No distention. No abdominal bruits. No CVA tenderness. Musculoskeletal: No lower extremity tenderness nor edema.  Neurologic:  Normal speech and language. No gross focal  neurologic deficits are appreciated. No gait instability. Skin:  Skin is warm, dry and intact. No rash noted.    ____________________________________________   LABS (all labs ordered are listed, but only abnormal results are displayed)  Labs Reviewed  COMPREHENSIVE METABOLIC PANEL - Abnormal; Notable for the following:       Result Value   Chloride 99 (*)    Glucose, Bld 435 (*)    Creatinine, Ser 1.03 (*)    AST 43 (*)    All other components within normal limits  ACETAMINOPHEN LEVEL - Abnormal; Notable for the following:    Acetaminophen (  Tylenol), Serum <10 (*)    All other components within normal limits  VALPROIC ACID LEVEL - Abnormal; Notable for the following:    Valproic Acid Lvl 17 (*)    All other components within normal limits  URINALYSIS, COMPLETE (UACMP) WITH MICROSCOPIC - Abnormal; Notable for the following:    Color, Urine YELLOW (*)    APPearance CLEAR (*)    Glucose, UA >=500 (*)    Protein, ur 100 (*)    Bacteria, UA RARE (*)    Squamous Epithelial / LPF 0-5 (*)    All other components within normal limits  GASTROINTESTINAL PANEL BY PCR, STOOL (REPLACES STOOL CULTURE)  C DIFFICILE QUICK SCREEN W PCR REFLEX  ETHANOL  SALICYLATE LEVEL  CBC  URINE DRUG SCREEN, QUALITATIVE ([facility] ONLY)  PREGNANCY, URINE  POCT PREGNANCY, URINE  CBG MONITORING, ED   ____________________________________________  EKG    ____________________________________________  RADIOLOGY    ____________________________________________   PROCEDURES  Procedure(s) performed:   Procedures  Critical Care performed:   ____________________________________________   INITIAL IMPRESSION / ASSESSMENT AND PLAN / ED COURSE  Pertinent labs & imaging results that were available during my care of the patient were reviewed by me and considered in my medical decision making (see chart for details).  Patient remains calm and cooperative in the emergency room. She has not had  diarrhea up to this point.      ____________________________________________   FINAL CLINICAL IMPRESSION(S) / ED DIAGNOSES  Final diagnoses:  Anxiety  Diarrhea, unspecified type      NEW MEDICATIONS STARTED DURING THIS VISIT:  New Prescriptions   No medications on file     Note:  This document was prepared using Dragon voice recognition software and may include unintentional dictation errors.    Arnaldo Natal, MD 05/01/17 [phone]

## 2017-05-02 DIAGNOSIS — F4325 Adjustment disorder with mixed disturbance of emotions and conduct: Secondary | ICD-10-CM

## 2017-05-02 LAB — GLUCOSE, CAPILLARY
GLUCOSE-CAPILLARY: 432 mg/dL — AB (ref 65–99)
Glucose-Capillary: 343 mg/dL — ABNORMAL HIGH (ref 65–99)
Glucose-Capillary: 364 mg/dL — ABNORMAL HIGH (ref 65–99)

## 2017-05-02 MED ORDER — DOCUSATE SODIUM 100 MG PO CAPS
100.0000 mg | ORAL_CAPSULE | Freq: Two times a day (BID) | ORAL | Status: DC
  Administered 2017-05-02: 100 mg via ORAL
  Filled 2017-05-02 (×3): qty 1

## 2017-05-02 MED ORDER — LOPERAMIDE HCL 2 MG PO CAPS: 4.0000 mg | ORAL_CAPSULE | Freq: Once | ORAL | Status: DC

## 2017-05-02 MED ORDER — CALCIUM CARBONATE ANTACID 500 MG PO CHEW
600.0000 mg | CHEWABLE_TABLET | Freq: Two times a day (BID) | ORAL | Status: DC
  Administered 2017-05-02 – 2017-05-03 (×2): 500 mg via ORAL
  Filled 2017-05-02 (×2): qty 3

## 2017-05-02 MED ORDER — OXYBUTYNIN CHLORIDE 5 MG PO TABS
5.0000 mg | ORAL_TABLET | Freq: Every morning | ORAL | Status: DC
  Administered 2017-05-02: 5 mg via ORAL
  Filled 2017-05-02 (×2): qty 1

## 2017-05-02 MED ORDER — INSULIN ASPART 100 UNIT/ML ~~LOC~~ SOLN
10.0000 [IU] | Freq: Once | SUBCUTANEOUS | Status: AC
  Administered 2017-05-02: 10 [IU] via SUBCUTANEOUS

## 2017-05-02 MED ORDER — INSULIN GLARGINE 100 UNIT/ML ~~LOC~~ SOLN
60.0000 [IU] | Freq: Every day | SUBCUTANEOUS | Status: DC
  Administered 2017-05-02: 60 [IU] via SUBCUTANEOUS
  Filled 2017-05-02 (×2): qty 0.6

## 2017-05-02 MED ORDER — CLONAZEPAM 0.5 MG PO TABS
1.0000 mg | ORAL_TABLET | Freq: Two times a day (BID) | ORAL | Status: DC
Start: 2017-05-02 — End: 2017-05-03
  Administered 2017-05-02 – 2017-05-03 (×3): 1 mg via ORAL
  Filled 2017-05-02 (×3): qty 2

## 2017-05-02 MED ORDER — METOPROLOL TARTRATE 50 MG PO TABS
50.0000 mg | ORAL_TABLET | Freq: Once | ORAL | Status: AC
  Administered 2017-05-02: 50 mg via ORAL
  Filled 2017-05-02: qty 1

## 2017-05-02 MED ORDER — INSULIN ASPART 100 UNIT/ML ~~LOC~~ SOLN: 10.0000 [IU] | Freq: Once | SUBCUTANEOUS | Status: DC

## 2017-05-02 MED ORDER — ASPIRIN 81 MG PO CHEW
81.0000 mg | CHEWABLE_TABLET | Freq: Every day | ORAL | Status: DC
  Administered 2017-05-02 – 2017-05-03 (×2): 81 mg via ORAL
  Filled 2017-05-02 (×2): qty 1

## 2017-05-02 MED ORDER — DIVALPROEX SODIUM 500 MG PO DR TAB
1500.0000 mg | DELAYED_RELEASE_TABLET | Freq: Every day | ORAL | Status: DC
  Administered 2017-05-02: 1500 mg via ORAL
  Filled 2017-05-02: qty 3

## 2017-05-02 MED ORDER — ADULT MULTIVITAMIN W/MINERALS CH
1.0000 | ORAL_TABLET | Freq: Once | ORAL | Status: AC
  Administered 2017-05-02: 1 via ORAL
  Filled 2017-05-02: qty 1

## 2017-05-02 MED ORDER — ARIPIPRAZOLE 10 MG PO TABS
30.0000 mg | ORAL_TABLET | Freq: Every day | ORAL | Status: DC
  Administered 2017-05-02: 30 mg via ORAL
  Filled 2017-05-02 (×2): qty 3

## 2017-05-02 MED ORDER — LORAZEPAM 1 MG PO TABS
ORAL_TABLET | ORAL | Status: AC
  Filled 2017-05-02: qty 2

## 2017-05-02 MED ORDER — GLIPIZIDE 10 MG PO TABS
10.0000 mg | ORAL_TABLET | Freq: Two times a day (BID) | ORAL | Status: DC
  Administered 2017-05-02 – 2017-05-03 (×2): 10 mg via ORAL
  Filled 2017-05-02 (×2): qty 1

## 2017-05-02 MED ORDER — INSULIN ASPART 100 UNIT/ML ~~LOC~~ SOLN
0.0000 [IU] | Freq: Three times a day (TID) | SUBCUTANEOUS | Status: DC
  Administered 2017-05-02: 15 [IU] via SUBCUTANEOUS
  Administered 2017-05-03: 8 [IU] via SUBCUTANEOUS
  Filled 2017-05-02 (×3): qty 1

## 2017-05-02 MED ORDER — GEMFIBROZIL 600 MG PO TABS
600.0000 mg | ORAL_TABLET | Freq: Two times a day (BID) | ORAL | Status: DC
  Administered 2017-05-02 – 2017-05-03 (×2): 600 mg via ORAL
  Filled 2017-05-02 (×2): qty 1

## 2017-05-02 MED ORDER — INSULIN ASPART 100 UNIT/ML ~~LOC~~ SOLN
2.0000 [IU] | Freq: Three times a day (TID) | SUBCUTANEOUS | Status: DC
  Administered 2017-05-02 – 2017-05-03 (×2): 2 [IU] via SUBCUTANEOUS
  Filled 2017-05-02 (×2): qty 1

## 2017-05-02 MED ORDER — LORAZEPAM 2 MG PO TABS
2.0000 mg | ORAL_TABLET | Freq: Once | ORAL | Status: AC
  Administered 2017-05-02: 2 mg via ORAL

## 2017-05-02 NOTE — ED Notes (Signed)
Pt. Has no teeth.  Ordered low-carb, low-sugar meal for patient. Food was chopped up to enable pt. To eat.

## 2017-05-02 NOTE — ED Notes (Signed)
Breakfast placed at bedside  Pt awakened

## 2017-05-02 NOTE — ED Notes (Signed)
BEHAVIORAL HEALTH ROUNDING Patient sleeping: No. Patient alert and oriented: yes Behavior appropriate: Yes.  ; If no, describe:  Nutrition and fluids offered: yes Toileting and hygiene offered: Yes  Sitter present: q15 minute observations and security  monitoring Law enforcement present: Yes  ODS  

## 2017-05-02 NOTE — ED Notes (Signed)
Son's phone # [phone]. Patient wanted to call to see if they was coming to get her. Son did not answer.

## 2017-05-02 NOTE — ED Notes (Signed)
Am meds administered as ordered  Assessment completed   

## 2017-05-02 NOTE — ED Notes (Signed)

## 2017-05-02 NOTE — ED Notes (Signed)
I called and spoke with her spouse to inform him of her pending discharge   - he stated  "i will find somebody to come on over and pick her up."

## 2017-05-02 NOTE — ED Notes (Signed)
Pt. Unable to sleep due to noise in hallway.  Pt. Given floor bed due to size of patient.  Pt. Also to big for largest size burg andy scrubs.  Nurse supervisor noted.  Pt. Put in large hospital gown with strings removed.  Pt. Comfortable at this time.  Pt. Also helped to bedside toilet.   Pt. Was only able to urinate at this time.

## 2017-05-02 NOTE — ED Notes (Signed)
ED  Is the patient under IVC or is there intent for IVC: Yes.   Is the patient medically cleared: Yes.   Is there vacancy in the ED BHU: Yes.   Is the population mix appropriate for patient:  weakness Is the patient awaiting placement in inpatient or outpatient setting:  Has the patient had a psychiatric consult:  Consult pending  Survey of unit performed for contraband, proper placement and condition of furniture, tampering with fixtures in bathroom, shower, and each patient room: Yes.  ; Findings:  APPEARANCE/BEHAVIOR Calm and cooperative NEURO ASSESSMENT Orientation: oriented x4  Denies pain Hallucinations: No  Denies .None noted (Hallucinations) Speech: Normal Gait: normal - shuffles her feet at times  - slow with her pivots  Stand by assistance provided  RESPIRATORY ASSESSMENT Even  Unlabored respirations  CARDIOVASCULAR ASSESSMENT Pulses equal   regular rate  Skin warm and dry   GASTROINTESTINAL ASSESSMENT no GI complaint EXTREMITIES Full ROM  PLAN OF CARE Provide calm/safe environment. Vital signs assessed twice daily. ED BHU Assessment once each 12-hour shift. Collaborate with TTS when available or as condition indicates. Assure the ED provider has rounded once each shift. Provide and encourage hygiene. Provide redirection as needed. Assess for escalating behavior; address immediately and inform ED provider.  Assess family dynamic and appropriateness for visitation as needed: Yes.  ; If necessary, describe findings:  Educate the patient/family about BHU procedures/visitation: Yes.  ; If necessary, describe findings:

## 2017-05-02 NOTE — ED Notes (Signed)
Pt assisted to bedside toilet. Pt missed toilet and peed on floor. Pt gown and chux changed. Pt assisted back to bed and diaper placed on pt. Floor cleaned by this tech. TV volume turned up and channel changed per pt request.

## 2017-05-02 NOTE — ED Notes (Signed)
Informed patient was abandoned by family, now is a social work issue.  Patient vol.

## 2017-05-02 NOTE — Consult Note (Signed)
[facility] Consult   Reason for Consult:  Consult for 51 year old woman with a history of some kind of chronic mental health issues brought in from home Referring Physician:  Jimmye Norman Patient Identification: [name] MRN:  [id number] Principal Diagnosis: Adjustment disorder with mixed disturbance of emotions and conduct Diagnosis:   Patient Active Problem List   Diagnosis Date Noted  . Adjustment disorder with mixed disturbance of emotions and conduct [F43.25] 05/02/2017  . Acute respiratory failure with hypoxia ([place]) [J96.01] 04/15/2014  . Septic shock(785.52) [A41.9, R65.21] 04/15/2014  . ARF (acute renal failure) ([place]) [N17.9] 04/12/2014  . SBO (small bowel obstruction) ([place]) [K56.609] 04/11/2014  . Bipolar disorder ([place]) [F31.9] 04/11/2014  . DM type 2 (diabetes mellitus, type 2) ([place]) [E11.9] 04/11/2014  . Seizure disorder ([place]) [Q65.784] 04/11/2014  . DEGENERATIVE JOINT DISEASE, RIGHT KNEE [M17.10] 07/13/2008  . KNEE PAIN [M25.569] 07/13/2008    Total Time spent with patient: 1 hour  Subjective:   ARLISA LECLERE is a 51 y.o. female patient admitted with "Dr. Yong Channel gave me morphine and a stool softener".  HPI:  Patient interviewed chart reviewed. Patient has a commitment filed by her family reports that she's been urinating and stooling uncontrollably recently. Also mentions that she had had some beliefs that might possibly be paranoid. Also mentions that she had recently stopped some of her medicine. On interview today the patient begins by telling me that her primary care doctor had been prescribing her morphine and a stool softener and that when she discovered this she discontinued them on her own. That was about 4 days ago. Since then she admits that she has had frequent loose bowel movements. She also has chronic urinary frequency. Patient says her mood is chronically up and down no different than usual. She sleeps okay. Completely denies any suicidal or  homicidal thoughts. She denies knowing of any hallucinations. She tells a long but ultimately understandable story about how when her husband had gone out to go to the dentist she, the patient, believed that someone was trying to break into the house. She called the police but no one was there. Apparently this didn't of possible paranoia was distressing to the family. No sign otherwise however that she is failing to take care of herself. Doesn't say she has any problem with any medicine except her morphine and stool softener.  Medical history: Multiple medical problems. Overweight. Diabetes. Chronic musculoskeletal pain  Substance abuse history: Denies any alcohol or drug abuse current or past  Social history: Patient is married lives with her husband. From what she describes it sounds like the husband may be quite a bit older than her. There are adult sons around but they aren't daily involved in her life.  Past Psychiatric History: Patient has a history of a diagnosis of bipolar disorder. Mental retardation is also mentioned at least one previous note. There is no history of some psychiatric hospitalizations anytime recently. She says she thinks she might of had won a long time ago. No history of suicide attempts. No history of violence to others. She is currently taking Depakote prescribed by faith and family which has been her long-standing psychiatric provider  Risk to Self: Suicidal Ideation: No Suicidal Intent: No Is patient at risk for suicide?: No Suicidal Plan?: No Access to Means: No What has been your use of drugs/alcohol within the last 12 months?: none identified How many times?: 0 Other Self Harm Risks: none identified Triggers for Past Attempts: None known Intentional  Self Injurious Behavior: None Risk to Others: Homicidal Ideation: No Thoughts of Harm to Others: No Current Homicidal Intent: No Current Homicidal Plan: No Access to Homicidal Means: No Identified Victim: none  identified History of harm to others?: No Assessment of Violence: None Noted Violent Behavior Description: none identified Does patient have access to weapons?: No Criminal Charges Pending?: No Does patient have a court date: No Prior Inpatient Therapy: Prior Inpatient Therapy: No Prior Therapy Dates: na Prior Therapy Facilty/Provider(s): na Reason for Treatment: na Prior Outpatient Therapy: Prior Outpatient Therapy: No Prior Therapy Dates: na Prior Therapy Facilty/Provider(s): na Reason for Treatment: na Does patient have an ACCT team?: No Does patient have Intensive In-House Services?  : No Does patient have [facility] services? : No Does patient have P4CC services?: No  Past Medical History:  Past Medical History:  Diagnosis Date  . Anxiety   . COPD (chronic obstructive pulmonary disease) ([place])   . Diabetes mellitus   . Fear of    falling  . 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 Hypertension   . Panic attacks   . Parkinson's disease   . Seizures ([place])     Past Surgical History:  Procedure Laterality Date  . ABDOMINAL HYSTERECTOMY    . OOPHORECTOMY     Family History: No family history on file. Family Psychiatric  History: Does not know of any family history Social History:  History  Alcohol Use No     History  Drug Use No    Social History   Social History  . Marital status: Married    Spouse name: N/A  . Number of children: N/A  . Years of education: N/A   Social History Main Topics  . Smoking status: Never Smoker  . Smokeless tobacco: Never Used  . Alcohol use No  . Drug use: No  . Sexual activity: Yes    Birth control/ protection: Surgical   Other Topics Concern  . Not on file   Social History Narrative  . No narrative on file   Additional Social History:    Allergies:  No Known Allergies  Labs:  Results for orders placed or performed during the hospital encounter of 05/01/17 (from the past 48 hour(s))  Comprehensive metabolic panel     Status: Abnormal     Collection Time: 05/01/17  6:52 PM  Result Value Ref Range   Sodium 138 135 - 145 mmol/L   Potassium 3.9 3.5 - 5.1 mmol/L   Chloride 99 (L) 101 - 111 mmol/L   CO2 27 22 - 32 mmol/L   Glucose, Bld 435 (H) 65 - 99 mg/dL   BUN 19 6 - 20 mg/dL   Creatinine, Ser 1.03 (H) 0.44 - 1.00 mg/dL   Calcium 9.5 8.9 - 10.3 mg/dL   Total Protein 6.5 6.5 - 8.1 g/dL   Albumin 3.5 3.5 - 5.0 g/dL   AST 43 (H) 15 - 41 U/L   ALT 27 14 - 54 U/L   Alkaline Phosphatase 97 38 - 126 U/L   Total Bilirubin 0.5 0.3 - 1.2 mg/dL   GFR calc non Af Amer >60 >60 mL/min   GFR calc Af Amer >60 >60 mL/min    Comment: (NOTE) The eGFR has been calculated using the CKD EPI equation. This calculation has not been validated in all clinical situations. eGFR's persistently <60 mL/min signify possible Chronic Kidney Disease.    Anion gap 12 5 - 15  Ethanol     Status: None   Collection Time:  05/01/17  6:52 PM  Result Value Ref Range   Alcohol, Ethyl (B) <5 <5 mg/dL    Comment:        LOWEST DETECTABLE LIMIT FOR SERUM ALCOHOL IS 5 mg/dL FOR MEDICAL PURPOSES ONLY   Salicylate level     Status: None   Collection Time: 05/01/17  6:52 PM  Result Value Ref Range   Salicylate Lvl <1.7 2.8 - 30.0 mg/dL  Acetaminophen level     Status: Abnormal   Collection Time: 05/01/17  6:52 PM  Result Value Ref Range   Acetaminophen (Tylenol), Serum <10 (L) 10 - 30 ug/mL    Comment:        THERAPEUTIC CONCENTRATIONS VARY SIGNIFICANTLY. A RANGE OF 10-30 ug/mL MAY BE AN EFFECTIVE CONCENTRATION FOR MANY PATIENTS. HOWEVER, SOME ARE BEST TREATED AT CONCENTRATIONS OUTSIDE THIS RANGE. ACETAMINOPHEN CONCENTRATIONS >150 ug/mL AT 4 HOURS AFTER INGESTION AND >50 ug/mL AT 12 HOURS AFTER INGESTION ARE OFTEN ASSOCIATED WITH TOXIC REACTIONS.   cbc     Status: None   Collection Time: 05/01/17  6:52 PM  Result Value Ref Range   WBC 4.4 3.6 - 11.0 K/uL   RBC 4.67 3.80 - 5.20 MIL/uL   Hemoglobin 13.0 12.0 - 16.0 g/dL   HCT 39.8 35.0 -  47.0 %   MCV 85.2 80.0 - 100.0 fL   MCH 27.9 26.0 - 34.0 pg   MCHC 32.8 32.0 - 36.0 g/dL   RDW 14.0 11.5 - 14.5 %   Platelets 164 150 - 440 K/uL  Urine Drug Screen, Qualitative     Status: None   Collection Time: 05/01/17  6:52 PM  Result Value Ref Range   Tricyclic, Ur Screen NONE DETECTED NONE DETECTED   Amphetamines, Ur Screen NONE DETECTED NONE DETECTED   MDMA (Ecstasy)Ur Screen NONE DETECTED NONE DETECTED   Cocaine Metabolite,Ur [place] NONE DETECTED NONE DETECTED   Opiate, Ur Screen NONE DETECTED NONE DETECTED   Phencyclidine (PCP) Ur S NONE DETECTED NONE DETECTED   Cannabinoid 50 Ng, Ur [place] NONE DETECTED NONE DETECTED   Barbiturates, Ur Screen NONE DETECTED NONE DETECTED   Benzodiazepine, Ur Scrn NONE DETECTED NONE DETECTED   Methadone Scn, Ur NONE DETECTED NONE DETECTED    Comment: (NOTE) 510  Tricyclics, urine               Cutoff 1000 ng/mL 200  Amphetamines, urine             Cutoff 1000 ng/mL 300  MDMA (Ecstasy), urine           Cutoff 500 ng/mL 400  Cocaine Metabolite, urine       Cutoff 300 ng/mL 500  Opiate, urine                   Cutoff 300 ng/mL 600  Phencyclidine (PCP), urine      Cutoff 25 ng/mL 700  Cannabinoid, urine              Cutoff 50 ng/mL 800  Barbiturates, urine             Cutoff 200 ng/mL 900  Benzodiazepine, urine           Cutoff 200 ng/mL 1000 Methadone, urine                Cutoff 300 ng/mL 1100 1200 The urine drug screen provides only a preliminary, unconfirmed 1300 analytical test result and should not be used for non-medical 1400 purposes. Clinical consideration and professional judgment  should 1500 be applied to any positive drug screen result due to possible 1600 interfering substances. A more specific alternate chemical method 1700 must be used in order to obtain a confirmed analytical result.  1800 Gas chromato graphy / mass spectrometry (GC/MS) is the preferred 1900 confirmatory method.   Valproic acid level     Status: Abnormal    Collection Time: 05/01/17  6:52 PM  Result Value Ref Range   Valproic Acid Lvl 17 (L) 50.0 - 100.0 ug/mL  Urinalysis, Complete w Microscopic     Status: Abnormal   Collection Time: 05/01/17  6:52 PM  Result Value Ref Range   Color, Urine YELLOW (A) YELLOW   APPearance CLEAR (A) CLEAR   Specific Gravity, Urine 1.023 1.005 - 1.030   pH 5.0 5.0 - 8.0   Glucose, UA >=500 (A) NEGATIVE mg/dL   Hgb urine dipstick NEGATIVE NEGATIVE   Bilirubin Urine NEGATIVE NEGATIVE   Ketones, ur NEGATIVE NEGATIVE mg/dL   Protein, ur 100 (A) NEGATIVE mg/dL   Nitrite NEGATIVE NEGATIVE   Leukocytes, UA NEGATIVE NEGATIVE   RBC / HPF 0-5 0 - 5 RBC/hpf   WBC, UA 0-5 0 - 5 WBC/hpf   Bacteria, UA RARE (A) NONE SEEN   Squamous Epithelial / LPF 0-5 (A) NONE SEEN  Pregnancy, urine     Status: None   Collection Time: 05/01/17  6:52 PM  Result Value Ref Range   Preg Test, Ur NEGATIVE NEGATIVE  Pregnancy, urine POC     Status: None   Collection Time: 05/01/17  8:40 PM  Result Value Ref Range   Preg Test, Ur NEGATIVE NEGATIVE    Comment:        THE SENSITIVITY OF THIS METHODOLOGY IS >24 mIU/mL   Glucose, capillary     Status: Abnormal   Collection Time: 05/01/17 11:24 PM  Result Value Ref Range   Glucose-Capillary 281 (H) 65 - 99 mg/dL  Glucose, capillary     Status: Abnormal   Collection Time: 05/02/17 12:27 PM  Result Value Ref Range   Glucose-Capillary 343 (H) 65 - 99 mg/dL    Current Facility-Administered Medications  Medication Dose Route Frequency Provider Last Rate Last Dose  . ARIPiprazole (ABILIFY) tablet 30 mg  30 mg Oral Daily Earleen Newport, MD   30 mg at 05/02/17 1041  . aspirin chewable tablet 81 mg  81 mg Oral Daily Earleen Newport, MD   81 mg at 05/02/17 1040  . calcium carbonate (TUMS - dosed in mg elemental calcium) chewable tablet 500 mg  500 mg Oral BID Earleen Newport, MD   500 mg at 05/02/17 1040  . clonazePAM (KLONOPIN) tablet 1 mg  1 mg Oral BID Earleen Newport,  MD   1 mg at 05/02/17 1041  . divalproex (DEPAKOTE) DR tablet 1,500 mg  1,500 mg Oral QHS Earleen Newport, MD      . docusate sodium (COLACE) capsule 100 mg  100 mg Oral BID Earleen Newport, MD   100 mg at 05/02/17 1043  . gemfibrozil (LOPID) tablet 600 mg  600 mg Oral BID AC Earleen Newport, MD      . glipiZIDE (GLUCOTROL) tablet 10 mg  10 mg Oral BID AC Earleen Newport, MD      . insulin aspart (novoLOG) injection 0-15 Units  0-15 Units Subcutaneous TID WC Earleen Newport, MD      . insulin aspart (novoLOG) injection 2 Units  2 Units Subcutaneous TID  WC Merlyn Lot, MD   2 Units at 05/02/17 1042  . insulin glargine (LANTUS) injection 60 Units  60 Units Subcutaneous QHS Earleen Newport, MD      . loperamide (IMODIUM) capsule 4 mg  4 mg Oral Once Earleen Newport, MD      . oxybutynin (DITROPAN) tablet 5 mg  5 mg Oral q morning - 10a Earleen Newport, MD   5 mg at 05/02/17 1040   Current Outpatient Prescriptions  Medication Sig Dispense Refill  . ARIPiprazole (ABILIFY) 30 MG tablet Take 30 mg by mouth every morning.      Marland Kitchen aspirin EC 81 MG tablet Take 81 mg by mouth every morning.      . calcium carbonate (OS-CAL) 600 MG TABS Take 600 mg by mouth 2 (two) times daily with a meal.      . clonazePAM (KLONOPIN) 1 MG tablet Take 1 mg by mouth 2 (two) times daily.     . divalproex (DEPAKOTE) 250 MG DR tablet Take 1,500 mg by mouth at bedtime.     . docusate sodium (COLACE) 100 MG capsule Take 100 mg by mouth 3 (three) times daily.      Marland Kitchen gabapentin (NEURONTIN) 300 MG capsule Take 1 capsule (300 mg total) by mouth 3 (three) times daily. 90 capsule 1  . gemfibrozil (LOPID) 600 MG tablet Take 600 mg by mouth 2 (two) times daily before a meal.      . glipiZIDE (GLUCOTROL) 10 MG tablet Take 10 mg by mouth 2 (two) times daily before a meal.      . insulin aspart (NOVOLOG) 100 UNIT/ML injection CBG < 70: implement hypoglycemia protocol CBG 70 - 120: 0 units CBG  121 - 150: 3 units CBG 151 - 200: 4 units CBG 201 - 250: 7 units CBG 251 - 300: 11 units CBG 301 - 350: 15 units CBG 351 - 400: 20 units 10 mL 2  . insulin glargine (LANTUS) 100 UNIT/ML injection Inject 0.6 mLs (60 Units total) into the skin daily. 10 mL 2  . isosorbide mononitrate (IMDUR) 30 MG 24 hr tablet Take 1 tablet (30 mg total) by mouth daily. 30 tablet 0  . metoprolol (TOPROL-XL) 50 MG 24 hr tablet Take 50 mg by mouth every morning.     . Multiple Vitamins-Minerals (MULTIVITAMINS THER. W/MINERALS) TABS Take 1 tablet by mouth every morning.      Marland Kitchen oxybutynin (DITROPAN) 5 MG tablet Take 5 mg by mouth every morning.      . repaglinide (PRANDIN) 0.5 MG tablet Take 0.5 mg by mouth 3 (three) times daily before meals.    . traMADol (ULTRAM) 50 MG tablet Take 50 mg by mouth at bedtime. For pain      Musculoskeletal: Strength & Muscle Tone: decreased Gait & Station: unsteady Patient leans: N/A  Psychiatric Specialty Exam: Physical Exam  Nursing note and vitals reviewed. Constitutional: She appears well-developed and well-nourished.    HENT:  Head: Normocephalic and atraumatic.  Eyes: Pupils are equal, round, and reactive to light. Conjunctivae are normal.  Neck: Normal range of motion.  Cardiovascular: Normal heart sounds.   Respiratory: Effort normal.  GI: Soft.  Musculoskeletal: Normal range of motion.  Neurological: She is alert.  Skin: Skin is warm and dry.  Psychiatric: She has a normal mood and affect. Her speech is tangential. She is not agitated, not aggressive, not hyperactive and not combative. Thought content is paranoid. Cognition and memory are impaired. She expresses impulsivity.  She expresses no homicidal and no suicidal ideation.    Review of Systems  Constitutional: Negative.   HENT: Negative.   Eyes: Negative.   Respiratory: Negative.   Cardiovascular: Negative.   Gastrointestinal: Positive for diarrhea.  Genitourinary: Positive for frequency.    Musculoskeletal: Negative.   Skin: Negative.   Neurological: Negative.   Psychiatric/Behavioral: Negative for depression, hallucinations, memory loss, substance abuse and suicidal ideas. The patient is nervous/anxious. The patient does not have insomnia.     Blood pressure (!) 156/83, pulse 94, temperature 98.6 F (37 C), temperature source Oral, resp. rate 20, height '5\' 5"'$  (1.651 m), weight 124.7 kg (275 lb), SpO2 96 %.Body mass index is 45.76 kg/m.  General Appearance: Casual  Eye Contact:  Fair  Speech:  Garbled and Mildly garbled but ultimately understandable with a little redirection  Volume:  Increased  Mood:  Euthymic  Affect:  Appropriate  Thought Process:  Disorganized  Orientation:  Full (Time, Place, and Person)  Thought Content:  Rumination and Tangential  Suicidal Thoughts:  No  Homicidal Thoughts:  No  Memory:  Immediate;   Good Recent;   Fair Remote;   Fair  Judgement:  Fair  Insight:  Fair  Psychomotor Activity:  Decreased  Concentration:  Concentration: Fair  Recall:  [employer] of Knowledge:  Fair  Language:  Fair  Akathisia:  No  Handed:  Right  AIMS (if indicated):     Assets:  Communication Skills Desire for Improvement Housing Social Support  ADL's:  Impaired  Cognition:  Impaired,  Mild  Sleep:        Treatment Plan Summary: Plan 51 year old woman with some kind of chronic mental health issues. Based on my interaction with her I would be willing to believe that she has some chronic developmental disability. Ultimately her story did make sense to me when I was able to follow it area there is no evidence of any acute dangerousness. She has some paranoid type thoughts about her husband and his wrens although none of them are so frankly bizarre as to be obviously psychotic. Ultimately there are no grounds for involuntary commitment or admission to the psychiatric hospital. Some of her altered mental status could be due to her recent opiate withdrawal  and the fact that her diabetes is poorly controlled. Case reviewed with emergency room physician and also with TTS and social work. Discontinue IVC. She can be discharged back home to her family and regular outpatient care  Disposition: Patient does not meet criteria for psychiatric inpatient admission. Supportive therapy provided about ongoing stressors.  Alethia Berthold, MD 05/02/2017 1:52 PM

## 2017-05-02 NOTE — ED Notes (Signed)
This RN explained to patient that her family is not coming tonight. Patient is demanding wanting "pimento cheese, orange sherbert" explained to patient we only have certain things. Patient requesting this RN help to change and bathe, patient is able to do this on her own.

## 2017-05-02 NOTE — ED Provider Notes (Signed)
Patient has been cleared by psychiatry for discharge. Currently she is going to receive a shot of insulin for her hyperglycemia.   [name], Marieta Markov E, MD 05/02/17 1254

## 2017-05-02 NOTE — ED Notes (Signed)
Received a call from someone stating that they IVC'd her - [name]  He states  "That doctor must be stupid if he says she can go home - what is your name - you are stupid too   No one will be coming to get her ever  I will see to it"

## 2017-05-02 NOTE — ED Provider Notes (Signed)
-----------------------------------------   3:50 AM on 05/02/2017 -----------------------------------------   Blood pressure (!) 153/80, pulse (!) 103, temperature 98.5 F (36.9 C), temperature source Oral, resp. rate 18, height 5\' 5"  (1.651 m), weight 124.7 kg (275 lb), SpO2 95 %.  The patient had no acute events since last update.  Calm and cooperative at this time.  Disposition is pending Psychiatry/Behavioral Medicine team recommendations.     Willy Eddyobinson, Gerturde Kuba, MD 05/02/17 [phone]

## 2017-05-02 NOTE — ED Notes (Signed)
Food tray given 

## 2017-05-02 NOTE — ED Notes (Signed)
Patient asked for me to call daughter Trula OreChristina to see if she was coming to get patient. Daughter stated she isn't coming tonight and to call her dad (pt husband). Her number is [phone]

## 2017-05-02 NOTE — ED Notes (Signed)
I called and spoke with her spouse to inform him of her pending discharge he stated  "Okay - I will find a way to come and get her."

## 2017-05-02 NOTE — ED Notes (Signed)
Pt linens changed and pt placed on bedside toilet with very minimal assistance. Pt given supplies for bed bath and is sitting in room bathing self. These items performed by this tech and RN, Amy. Pt given breakfast tray.

## 2017-05-02 NOTE — ED Notes (Signed)
Filed APS abandonment case with DSS worker

## 2017-05-02 NOTE — ED Notes (Signed)
Patient observed lying in bed with eyes closed  Even, unlabored respirations observed   NAD pt appears to be sleeping  I will continue to monitor along with every 15 minute visual observations and ongoing security monitoring    [facility]

## 2017-05-02 NOTE — ED Notes (Signed)
ACSD officer has arrived and subpoenaed her for a hearing about her competence  - family sent word with the officer that they are never going to come and pick her up  MD informed

## 2017-05-02 NOTE — ED Notes (Signed)
Pt back to bed with minimal assistance and instructions by RN and tech.

## 2017-05-02 NOTE — ED Notes (Signed)
Pt. Called out to say she was dirty.  Pt. Checked and was clean. Pt. Helped to bedside toilet.  Pt. Had a large well formed stool.

## 2017-05-02 NOTE — ED Notes (Signed)
PT VOL. PENDING SOCIAL WORK

## 2017-05-03 LAB — GLUCOSE, CAPILLARY: GLUCOSE-CAPILLARY: 262 mg/dL — AB (ref 65–99)

## 2017-05-03 MED ORDER — DIPHENHYDRAMINE HCL 25 MG PO CAPS
50.0000 mg | ORAL_CAPSULE | Freq: Once | ORAL | Status: AC
  Administered 2017-05-03: 50 mg via ORAL
  Filled 2017-05-03: qty 2

## 2017-05-03 NOTE — Progress Notes (Signed)
Bedside RN filed APS report on last night regarding family abandonment. CSW [name] [facility], and as it currently stands, Patient has been petitioned by her husband for competency. Patient currently remains legally responsible for herself. Patient has been medically and psychiatrically cleared for discharge and is requesting to return home. Per [name], if Patient is mobile, she can return home via taxi. CSW engaged with Patient at her bedside. CSW introduced self and role of CSW. CSW explained to Patient that her family is currently refusing to pick her up. Patient requested that CSW give her daughter, [name] [phone]), a phone call. [name] did not answer. Patient requested that CSW give her husband, [name] [phone] a phone call. Justo did not answer. Patient requested that CSW give her son [name] [phone]) a call. CSW spoke with Casey Ohmhris and informed him that Patient has been medically and psychiatrically cleared. CSW explained understanding that Patient's husband has petitioned for a competency hearing for the patient, however, Patient currently remains legally responsible for herself and is requesting to return home. [name] expressed understanding and reports that [name] will be at the home when Patient arrives. CSW also received a return phone call from [place] and informed her of the above information. [name] expressed understanding. CSW informed Patient at a taxi voucher could be provided if Patient is able to walk. Patient reported that she would be able to walk but would prefer an ambulance as it is "cooler- and I tend to sweat". CSW explained that ambulance transport is only appropriate when medically necessary. CSW informed Patient's RN and MD.   CSW attempted Patient's husband again with no answer. [place] [facility] to inform them of the case as [place] a [place] of [place]. Per Eston MouldFlorence Shepherd, Adult Services Worker, Patient may discharge home via taxi. Taxi voucher provided to [employer]. CSW signing off. Please contact should new need(s) arise.     Enos FlingAshley Ruel Dimmick, MSW, LCSW [facility] Clinical Social Worker [phone]

## 2017-05-03 NOTE — ED Notes (Signed)
Patient give DIET ginger ale. After this only water. Patient also requested to have bed rail put up on bed.

## 2017-05-03 NOTE — ED Provider Notes (Signed)
-----------------------------------------   8:11 AM on 05/03/2017 -----------------------------------------   BP (!) 156/104 (BP Location: Right Arm)   Pulse (!) 115   Temp 99.5 F (37.5 C) (Oral)   Resp 18   Ht 5\' 5"  (1.651 m)   Wt 124.7 kg (275 lb)   SpO2 95%   BMI 45.76 kg/m   No acute events since last update. Disposition is pending per Psychiatry/Behavioral Medicine team recommendations.     [name], Casey Zangara, MD 05/03/17 [phone]

## 2017-05-03 NOTE — ED Notes (Signed)
Patient asked ed tech to "fluff her pillows" while he was in there.

## 2017-05-03 NOTE — ED Notes (Signed)
This Rn fluffed patients pillow for her and put her socks on for her. Provided her with another warm blanket.

## 2017-05-03 NOTE — ED Notes (Signed)
Patient peed the bed. Bed changed, new gown and linens supplied. Patient wanted this RN to give her a sponge bath, provided patient with incontinent spray to do her self. Patient placed in bed with warm blankets. Explained to patient we cannot control the temperature in the room.   Patient wants this RN to remind her every 2 hours to urinate. Patient states she wants the lights on and is going to try to sleep. Patient wanted both side rails up at the bottom, told patient no this prevents her from getting up going to the bath room.

## 2018-04-22 DIAGNOSIS — N182 Chronic kidney disease, stage 2 (mild): Secondary | ICD-10-CM | POA: Insufficient documentation

## 2018-04-22 DIAGNOSIS — I10 Essential (primary) hypertension: Secondary | ICD-10-CM | POA: Insufficient documentation

## 2018-04-29 ENCOUNTER — Other Ambulatory Visit (HOSPITAL_COMMUNITY): Payer: Self-pay | Admitting: Medical

## 2018-04-29 DIAGNOSIS — N183 Chronic kidney disease, stage 3 unspecified: Secondary | ICD-10-CM

## 2018-05-10 ENCOUNTER — Ambulatory Visit (HOSPITAL_COMMUNITY): Payer: Medicaid Other

## 2018-05-10 ENCOUNTER — Other Ambulatory Visit (HOSPITAL_COMMUNITY): Payer: Self-pay | Admitting: Nephrology

## 2018-05-10 DIAGNOSIS — N182 Chronic kidney disease, stage 2 (mild): Secondary | ICD-10-CM

## 2018-05-15 ENCOUNTER — Ambulatory Visit (HOSPITAL_COMMUNITY)
Admission: RE | Admit: 2018-05-15 | Discharge: 2018-05-15 | Disposition: A | Discharge status: Home / Self Care | Payer: Medicaid Other | Source: Ambulatory Visit | Attending: Nephrology | Admitting: Nephrology

## 2018-05-15 DIAGNOSIS — N182 Chronic kidney disease, stage 2 (mild): Secondary | ICD-10-CM | POA: Diagnosis not present

## 2018-05-15 DIAGNOSIS — N281 Cyst of kidney, acquired: Secondary | ICD-10-CM | POA: Insufficient documentation

## 2021-04-15 ENCOUNTER — Encounter (HOSPITAL_COMMUNITY): Payer: Self-pay

## 2021-04-15 ENCOUNTER — Emergency Department (HOSPITAL_COMMUNITY): Payer: Medicaid Other

## 2021-04-15 ENCOUNTER — Other Ambulatory Visit: Payer: Self-pay

## 2021-04-15 ENCOUNTER — Emergency Department (HOSPITAL_COMMUNITY)
Admission: EM | Admit: 2021-04-15 | Discharge: 2021-04-15 | Disposition: A | Discharge status: Home / Self Care | Payer: Medicaid Other | Attending: Emergency Medicine | Admitting: Emergency Medicine

## 2021-04-15 DIAGNOSIS — H5789 Other specified disorders of eye and adnexa: Secondary | ICD-10-CM | POA: Insufficient documentation

## 2021-04-15 DIAGNOSIS — J449 Chronic obstructive pulmonary disease, unspecified: Secondary | ICD-10-CM | POA: Diagnosis not present

## 2021-04-15 DIAGNOSIS — T7840XA Allergy, unspecified, initial encounter: Secondary | ICD-10-CM | POA: Diagnosis not present

## 2021-04-15 DIAGNOSIS — I10 Essential (primary) hypertension: Secondary | ICD-10-CM | POA: Diagnosis not present

## 2021-04-15 DIAGNOSIS — M7989 Other specified soft tissue disorders: Secondary | ICD-10-CM | POA: Insufficient documentation

## 2021-04-15 DIAGNOSIS — Z794 Long term (current) use of insulin: Secondary | ICD-10-CM | POA: Insufficient documentation

## 2021-04-15 DIAGNOSIS — Z7982 Long term (current) use of aspirin: Secondary | ICD-10-CM | POA: Diagnosis not present

## 2021-04-15 DIAGNOSIS — Z79899 Other long term (current) drug therapy: Secondary | ICD-10-CM | POA: Insufficient documentation

## 2021-04-15 DIAGNOSIS — E119 Type 2 diabetes mellitus without complications: Secondary | ICD-10-CM | POA: Diagnosis not present

## 2021-04-15 DIAGNOSIS — Z20822 Contact with and (suspected) exposure to covid-19: Secondary | ICD-10-CM | POA: Diagnosis not present

## 2021-04-15 DIAGNOSIS — G20 Parkinson's disease: Secondary | ICD-10-CM | POA: Diagnosis not present

## 2021-04-15 LAB — CBC WITH DIFFERENTIAL/PLATELET
Abs Immature Granulocytes: 0.02 10*3/uL (ref 0.00–0.07)
Basophils Absolute: 0 10*3/uL (ref 0.0–0.1)
Basophils Relative: 1 %
Eosinophils Absolute: 0.1 10*3/uL (ref 0.0–0.5)
Eosinophils Relative: 2 %
HCT: 47.9 % — ABNORMAL HIGH (ref 36.0–46.0)
Hemoglobin: 14.4 g/dL (ref 12.0–15.0)
Immature Granulocytes: 0 %
Lymphocytes Relative: 20 %
Lymphs Abs: 1.2 10*3/uL (ref 0.7–4.0)
MCH: 29 pg (ref 26.0–34.0)
MCHC: 30.1 g/dL (ref 30.0–36.0)
MCV: 96.4 fL (ref 80.0–100.0)
Monocytes Absolute: 0.6 10*3/uL (ref 0.1–1.0)
Monocytes Relative: 10 %
Neutro Abs: 4.1 10*3/uL (ref 1.7–7.7)
Neutrophils Relative %: 67 %
Platelets: 260 10*3/uL (ref 150–400)
RBC: 4.97 MIL/uL (ref 3.87–5.11)
RDW: 15.4 % (ref 11.5–15.5)
WBC: 6.1 10*3/uL (ref 4.0–10.5)
nRBC: 1 % — ABNORMAL HIGH (ref 0.0–0.2)

## 2021-04-15 LAB — COMPREHENSIVE METABOLIC PANEL
ALT: 48 U/L — ABNORMAL HIGH (ref 0–44)
AST: 33 U/L (ref 15–41)
Albumin: 3.2 g/dL — ABNORMAL LOW (ref 3.5–5.0)
Alkaline Phosphatase: 134 U/L — ABNORMAL HIGH (ref 38–126)
Anion gap: 7 (ref 5–15)
BUN: 45 mg/dL — ABNORMAL HIGH (ref 6–20)
CO2: 28 mmol/L (ref 22–32)
Calcium: 8.7 mg/dL — ABNORMAL LOW (ref 8.9–10.3)
Chloride: 106 mmol/L (ref 98–111)
Creatinine, Ser: 1.96 mg/dL — ABNORMAL HIGH (ref 0.44–1.00)
GFR, Estimated: 29 mL/min — ABNORMAL LOW (ref >60)
Glucose, Bld: 218 mg/dL — ABNORMAL HIGH (ref 70–99)
Potassium: 4.9 mmol/L (ref 3.5–5.1)
Sodium: 141 mmol/L (ref 135–145)
Total Bilirubin: 0.6 mg/dL (ref 0.3–1.2)
Total Protein: 6.1 g/dL — ABNORMAL LOW (ref 6.5–8.1)

## 2021-04-15 LAB — RESP PANEL BY RT-PCR (FLU A&B, COVID) ARPGX2
Influenza A by PCR: NEGATIVE
Influenza B by PCR: NEGATIVE
SARS Coronavirus 2 by RT PCR: NEGATIVE

## 2021-04-15 MED ORDER — PREDNISONE 20 MG PO TABS: ORAL_TABLET | ORAL | 0 refills | Status: DC

## 2021-04-15 MED ORDER — ALBUTEROL SULFATE HFA 108 (90 BASE) MCG/ACT IN AERS
2.0000 | INHALATION_SPRAY | Freq: Once | RESPIRATORY_TRACT | Status: AC
  Administered 2021-04-15: 2 via RESPIRATORY_TRACT
  Filled 2021-04-15: qty 6.7

## 2021-04-15 MED ORDER — SODIUM CHLORIDE 0.9 % IV BOLUS
500.0000 mL | Freq: Once | INTRAVENOUS | Status: AC
  Administered 2021-04-15: 500 mL via INTRAVENOUS

## 2021-04-15 MED ORDER — METHYLPREDNISOLONE SODIUM SUCC 125 MG IJ SOLR
125.0000 mg | Freq: Once | INTRAMUSCULAR | Status: AC
  Administered 2021-04-15: 125 mg via INTRAVENOUS
  Filled 2021-04-15: qty 2

## 2021-04-15 MED ORDER — FAMOTIDINE IN NACL 20-0.9 MG/50ML-% IV SOLN
20.0000 mg | Freq: Once | INTRAVENOUS | Status: AC
  Administered 2021-04-15: 20 mg via INTRAVENOUS
  Filled 2021-04-15: qty 50

## 2021-04-15 NOTE — Discharge Instructions (Addendum)
Take Benadryl 25 mg every 4-6 hours as needed for swelling and itching and follow-up with your family doctor next week.  Return if any problem

## 2021-04-15 NOTE — ED Provider Notes (Signed)
Emergency Medicine Provider Triage Evaluation Note  DESIA SABAN , a 55 y.o. female  was evaluated in triage.  Seen by me at 2:23 PM on arrival, currently on EMS stretcher.  Pt complains of allergic reaction.  She called EMS because of swelling, generally.  EMS noted that she was hypoxic (on 2 L nasal cannula) and that her tongue was swollen.  They treated her with epinephrine, oxygen, and Benadryl.  Patient usually takes oxygen 2 L per nasal cannula at home.  She thinks she may have been stung by a bee because she saw a bee, on her sheets while she was lying in bed.  Patient had improvement during transport per EMS  Review of Systems  Positive: Shortness of breath, hypoxia, swollen tongue Negative: No chest pain, confusion or weakness.  Physical Exam  There were no vitals taken for this visit. Gen:   Awake, no distress   Resp:  Normal effort  MSK:   Moves extremities without difficulty  Other:  Speech somewhat garbled, no respiratory distress or focal weakness.  Tongue may be mildly swollen at this time.  Medical Decision Making  Medically screening exam initiated at 2:37 PM.  Appropriate orders placed.  MACHELE DEIHL was informed that the remainder of the evaluation will be completed by another provider, this initial triage assessment does not replace that evaluation, and the importance of remaining in the ED until their evaluation is complete.     Mancel Bale, MD 04/15/21 [phone]

## 2021-04-15 NOTE — ED Provider Notes (Signed)
[facility] EMERGENCY DEPARTMENT Provider Note   CSN: [id number] Arrival date & time: 04/15/21  1423     History No chief complaint on file.   [name] is a 55 y.o. female.  Patient complains of swelling to her left eye.  She found to be in her sheets and she thinks she might of been stung by  The history is provided by the patient. No language interpreter was used.  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     No family history on file.  Social History   Tobacco Use   Smoking status: Never   Smokeless tobacco: Never  Substance Use Topics   Alcohol use: No   Drug use: No    Home Medications Prior to Admission medications   Medication Sig Start Date End Date Taking? Authorizing Provider  ARIPiprazole (ABILIFY) 30 MG tablet Take  30 mg by mouth every morning.     Yes [provider]  aspirin EC 81 MG tablet Take 81 mg by mouth every morning.     Yes [provider]  Biotin 10 MG CAPS Take 1 capsule by mouth daily.   Yes [provider]  calcium carbonate (OS-CAL) 600 MG TABS Take 600 mg by mouth 2 (two) times daily with a meal.     Yes [provider]  Cholecalciferol (VITAMIN D3) 50 MCG (2000 UT) TABS Take 1 tablet by mouth daily.   Yes [provider]  docusate sodium (COLACE) 100 MG capsule Take 100 mg by mouth 3 (three) times daily.     Yes [provider]  DULoxetine (CYMBALTA) 60 MG capsule Take 60 mg by mouth 2 (two) times daily as needed (mood).   Yes [provider]  gabapentin (NEURONTIN) 300 MG capsule Take 1 capsule (300 mg total) by mouth 3 (three) times daily. 04/20/14  Yes Kathlen Mody, MD  hydrOXYzine (ATARAX/VISTARIL) 25 MG tablet TAKE 1 TABLET THREE TIMES DAILY AS NEEDED FOR ANXIETY   Yes [provider]  isosorbide mononitrate (IMDUR) 30 MG 24 hr tablet Take 1 tablet (30 mg total) by mouth daily. 04/20/14  Yes Kathlen Mody, MD  lisinopril (ZESTRIL) 2.5 MG tablet Take 2.5 mg by mouth in  the morning and at bedtime.   Yes [provider]  magnesium chloride (SLOW-MAG) 64 MG TBEC SR tablet Take 1 tablet by mouth daily.   Yes [provider]  mirabegron ER (MYRBETRIQ) 25 MG TB24 tablet Take 1 tablet by mouth 2 (two) times daily.   Yes [provider]  predniSONE (DELTASONE) 20 MG tablet 2 tabs po daily x 3 days 04/15/21  Yes Bethann BerkshireZammit, Rashawnda Gaba, MD  propranolol (INDERAL) 60 MG tablet Take 60 mg by mouth daily. On an empty stomach   Yes [provider]  clonazePAM (KLONOPIN) 1 MG tablet Take 1 mg by mouth 2 (two) times daily.     [provider]  divalproex (DEPAKOTE) 250 MG DR tablet Take 1,500 mg by mouth at bedtime.  Patient not taking: Reported on 04/15/2021    [provider]  gemfibrozil  (LOPID) 600 MG tablet Take 600 mg by mouth 2 (two) times daily before a meal.   Patient not taking: Reported on 04/15/2021    [provider]  glipiZIDE (GLUCOTROL) 10 MG tablet Take 10 mg by mouth 2 (two) times daily before a meal.   Patient not taking: Reported on 04/15/2021    [provider]  insulin aspart (NOVOLOG) 100 UNIT/ML injection CBG < 70: implement hypoglycemia protocol CBG 70 - 120: 0 units CBG 121 - 150: 3 units CBG 151 - 200: 4 units CBG 201 - 250: 7 units CBG 251 - 300: 11 units CBG 301 - 350: 15 units CBG 351 - 400: 20 units 04/20/14   Kathlen ModyAkula, Vijaya, MD  insulin glargine (LANTUS) 100 UNIT/ML injection Inject 0.6 mLs (60 Units total) into the skin daily. 04/20/14   Kathlen ModyAkula, Vijaya, MD  liraglutide (VICTOZA) 18 MG/3ML SOPN USE 1.8 MG SUBCUTANEOUSLY ONCE DAILY.    [provider]  metoprolol (TOPROL-XL) 50 MG 24 hr tablet Take 50 mg by mouth every morning.  Patient not taking: Reported on 04/15/2021    [provider]  Multiple Vitamins-Minerals (MULTIVITAMINS THER. W/MINERALS) TABS Take 1 tablet by mouth every morning.   Patient not taking: Reported on 04/15/2021    [provider]  oxybutynin (DITROPAN) 5 MG tablet Take 5 mg by mouth every morning.   Patient not taking: Reported on 04/15/2021    [provider]  pravastatin (PRAVACHOL) 40 MG tablet Take by mouth. Patient not taking: Reported on 04/15/2021    [provider]  repaglinide (PRANDIN) 0.5 MG tablet Take 0.5 mg by mouth 3 (three) times daily before meals. Patient not taking: Reported on 04/15/2021    [provider]  traMADol (ULTRAM) 50 MG tablet Take 50 mg by mouth at bedtime. For pain Patient not taking: Reported on 04/15/2021    [provider]    Allergies    Bee venom  Review of Systems   Review of Systems  Constitutional:  Negative for appetite change and fatigue.  HENT:  Negative for congestion, ear discharge and sinus pressure.         Facial swelling on the left side around her left eye  Eyes:  Negative for discharge.  Respiratory:  Negative for cough.   Cardiovascular:  Negative for chest pain.  Gastrointestinal:  Negative for abdominal pain and diarrhea.  Genitourinary:  Negative for frequency and hematuria.  Musculoskeletal:  Negative for back pain.  Skin:  Negative for rash.  Neurological:  Negative for seizures and headaches.  Psychiatric/Behavioral:  Negative for hallucinations.    Physical Exam Updated Vital Signs  BP 116/78   Pulse 81   Resp 16   Ht 5\' 8"  (1.727 m)   Wt 126.1 kg   SpO2 (!) 88%   BMI 42.27 kg/m   Physical Exam Vitals and nursing note reviewed.  Constitutional:      Appearance: She is well-developed.  HENT:     Head: Normocephalic.     Comments: Moderate facial swelling around her left eye    Nose: Nose normal.  Eyes:     General: No scleral icterus.    Conjunctiva/sclera: Conjunctivae normal.  Neck:     Thyroid: No thyromegaly.  Cardiovascular:     Rate and Rhythm: Normal rate and regular rhythm.     Heart sounds: No murmur heard.   No friction rub. No gallop.  Pulmonary:     Breath sounds: No stridor. No wheezing or rales.  Chest:     Chest wall: No tenderness.  Abdominal:     General: There is no distension.     Tenderness: There is no abdominal tenderness. There is no rebound.  Musculoskeletal:        General: Normal range of motion.     Cervical back: Neck supple.  Lymphadenopathy:     Cervical: No cervical adenopathy.  Skin:    Findings: No erythema or rash.  Neurological:     Mental Status: She is alert and oriented to person, place, and time.     Motor: No abnormal muscle tone.     Coordination: Coordination normal.  Psychiatric:        Behavior: Behavior normal.    ED Results / Procedures / Treatments   Labs (all labs ordered are listed, but only abnormal results are displayed) Labs Reviewed  CBC WITH DIFFERENTIAL/PLATELET - Abnormal; Notable  for the following components:      Result Value   HCT 47.9 (*)    nRBC 1.0 (*)    All other components within normal limits  COMPREHENSIVE METABOLIC PANEL - Abnormal; Notable for the following components:   Glucose, Bld 218 (*)    BUN 45 (*)    Creatinine, Ser 1.96 (*)    Calcium 8.7 (*)    Total Protein 6.1 (*)    Albumin 3.2 (*)    ALT 48 (*)    Alkaline Phosphatase 134 (*)    GFR, Estimated 29 (*)    All other components within normal limits  RESP PANEL BY RT-PCR (FLU A&B, COVID) [id number]    EKG None  Radiology DG Chest Port 1 View  Result Date: 04/15/2021 CLINICAL DATA:  sob EXAM: PORTABLE CHEST 1 VIEW COMPARISON:  April 16, 2014 FINDINGS: The cardiomediastinal silhouette is unchanged and enlarged in contour.Atherosclerotic calcifications. No pleural effusion. No pneumothorax. Perihilar vascular prominence without overt edema. Visualized abdomen is unremarkable. No acute osseous abnormality. IMPRESSION: No acute cardiopulmonary abnormality. Electronically Signed   By: April 18, 2014 MD   On: 04/15/2021 15:55    Procedures Procedures   Medications Ordered in ED Medications  sodium chloride 0.9 % bolus 500 mL (0 mLs Intravenous Stopped 04/15/21 1701)  methylPREDNISolone sodium succinate (SOLU-MEDROL) 125 mg/2 mL injection 125 mg (125 mg Intravenous Given 04/15/21 1552)  famotidine (PEPCID) IVPB 20 mg premix (0 mg Intravenous Stopped 04/15/21 1630)  albuterol (VENTOLIN HFA) 108 (90 Base) MCG/ACT inhaler 2 puff (2 puffs Inhalation Given 04/15/21 1553)  sodium chloride 0.9 % bolus 500 mL (500 mLs Intravenous New Bag/Given 04/15/21 1806)    ED Course  I have reviewed the triage vital  signs and the nursing notes.  Pertinent labs & imaging results that were available during my care of the patient were reviewed by me and considered in my medical decision making (see chart for details). Patient with swelling lateral left thigh.  No other systemic changes from allergic reaction.  She  was given epi by EMS and Benadryl and steroids here swelling did improve.   MDM Rules/Calculators/A&P                           Patient with allergic reaction possibly due to insect sting to her face.  Patient is improving she is sent home with Benadryl and prednisone Final Clinical Impression(s) / ED Diagnoses Final diagnoses:  Allergic reaction, initial encounter    Rx / DC Orders ED Discharge Orders          Ordered    predniSONE (DELTASONE) 20 MG tablet        04/15/21 Dorothy Spark, MD 04/18/21 1131

## 2021-04-15 NOTE — ED Triage Notes (Signed)
EMS reports pt c/o swelling to face x 1 week.  Reports swelling worse today.  02 80% on 2 liters and is usually on 02 at 2liters at home.  EMS gave 0.3epi IM and 50mg  benadryl IV.  EMS put pt on NRB at 15 liters.  O2 sat 99%.  Pt says 2 days ago she saw a bee on her sheet, unsure if she got stung.  Pt says isn't hurting but doesn't Feel well.  Pt has swelling to face and c/o feeling like tongue swelling.  EMS says they noticed tongue swelling but improved with medication.

## 2021-04-22 ENCOUNTER — Other Ambulatory Visit: Payer: Self-pay

## 2021-04-22 ENCOUNTER — Encounter (HOSPITAL_COMMUNITY): Payer: Self-pay

## 2021-04-22 ENCOUNTER — Emergency Department (HOSPITAL_COMMUNITY): Payer: Medicaid Other

## 2021-04-22 ENCOUNTER — Inpatient Hospital Stay (HOSPITAL_COMMUNITY): Payer: Medicaid Other

## 2021-04-22 ENCOUNTER — Inpatient Hospital Stay (HOSPITAL_COMMUNITY): Payer: Medicaid Other | Admitting: Anesthesiology

## 2021-04-22 ENCOUNTER — Encounter (HOSPITAL_COMMUNITY)
Admission: EM | Disposition: A | Discharge status: Skilled Nursing Facility | Payer: Self-pay | Source: Home / Self Care | Attending: Internal Medicine

## 2021-04-22 ENCOUNTER — Inpatient Hospital Stay (HOSPITAL_COMMUNITY)
Admission: EM | Admit: 2021-04-22 | Discharge: 2021-05-04 | DRG: 562 | Disposition: A | Discharge status: Skilled Nursing Facility | Payer: Medicaid Other | Attending: Internal Medicine | Admitting: Internal Medicine

## 2021-04-22 DIAGNOSIS — Z7984 Long term (current) use of oral hypoglycemic drugs: Secondary | ICD-10-CM

## 2021-04-22 DIAGNOSIS — J9622 Acute and chronic respiratory failure with hypercapnia: Secondary | ICD-10-CM | POA: Diagnosis present

## 2021-04-22 DIAGNOSIS — Z6841 Body Mass Index (BMI) 40.0 and over, adult: Secondary | ICD-10-CM | POA: Diagnosis not present

## 2021-04-22 DIAGNOSIS — S4291XA Fracture of right shoulder girdle, part unspecified, initial encounter for closed fracture: Principal | ICD-10-CM | POA: Diagnosis present

## 2021-04-22 DIAGNOSIS — E662 Morbid (severe) obesity with alveolar hypoventilation: Secondary | ICD-10-CM | POA: Diagnosis present

## 2021-04-22 DIAGNOSIS — Z91030 Bee allergy status: Secondary | ICD-10-CM

## 2021-04-22 DIAGNOSIS — S43014S Anterior dislocation of right humerus, sequela: Secondary | ICD-10-CM

## 2021-04-22 DIAGNOSIS — N179 Acute kidney failure, unspecified: Secondary | ICD-10-CM

## 2021-04-22 DIAGNOSIS — J449 Chronic obstructive pulmonary disease, unspecified: Secondary | ICD-10-CM | POA: Diagnosis present

## 2021-04-22 DIAGNOSIS — N1831 Chronic kidney disease, stage 3a: Secondary | ICD-10-CM | POA: Diagnosis present

## 2021-04-22 DIAGNOSIS — J9602 Acute respiratory failure with hypercapnia: Secondary | ICD-10-CM

## 2021-04-22 DIAGNOSIS — W06XXXA Fall from bed, initial encounter: Secondary | ICD-10-CM | POA: Diagnosis present

## 2021-04-22 DIAGNOSIS — F319 Bipolar disorder, unspecified: Secondary | ICD-10-CM | POA: Diagnosis present

## 2021-04-22 DIAGNOSIS — W19XXXA Unspecified fall, initial encounter: Secondary | ICD-10-CM

## 2021-04-22 DIAGNOSIS — E1122 Type 2 diabetes mellitus with diabetic chronic kidney disease: Secondary | ICD-10-CM | POA: Diagnosis present

## 2021-04-22 DIAGNOSIS — J9601 Acute respiratory failure with hypoxia: Secondary | ICD-10-CM

## 2021-04-22 DIAGNOSIS — E538 Deficiency of other specified B group vitamins: Secondary | ICD-10-CM | POA: Diagnosis present

## 2021-04-22 DIAGNOSIS — R0902 Hypoxemia: Secondary | ICD-10-CM

## 2021-04-22 DIAGNOSIS — I129 Hypertensive chronic kidney disease with stage 1 through stage 4 chronic kidney disease, or unspecified chronic kidney disease: Secondary | ICD-10-CM | POA: Diagnosis present

## 2021-04-22 DIAGNOSIS — J9811 Atelectasis: Secondary | ICD-10-CM | POA: Diagnosis present

## 2021-04-22 DIAGNOSIS — G20 Parkinson's disease: Secondary | ICD-10-CM | POA: Diagnosis present

## 2021-04-22 DIAGNOSIS — J9621 Acute and chronic respiratory failure with hypoxia: Secondary | ICD-10-CM | POA: Diagnosis present

## 2021-04-22 DIAGNOSIS — Z90710 Acquired absence of both cervix and uterus: Secondary | ICD-10-CM

## 2021-04-22 DIAGNOSIS — R0603 Acute respiratory distress: Secondary | ICD-10-CM | POA: Diagnosis not present

## 2021-04-22 DIAGNOSIS — M1990 Unspecified osteoarthritis, unspecified site: Secondary | ICD-10-CM | POA: Diagnosis present

## 2021-04-22 DIAGNOSIS — S43014A Anterior dislocation of right humerus, initial encounter: Secondary | ICD-10-CM

## 2021-04-22 DIAGNOSIS — G40909 Epilepsy, unspecified, not intractable, without status epilepticus: Secondary | ICD-10-CM | POA: Diagnosis present

## 2021-04-22 DIAGNOSIS — R509 Fever, unspecified: Secondary | ICD-10-CM | POA: Diagnosis present

## 2021-04-22 DIAGNOSIS — Z79899 Other long term (current) drug therapy: Secondary | ICD-10-CM

## 2021-04-22 DIAGNOSIS — G4730 Sleep apnea, unspecified: Secondary | ICD-10-CM | POA: Diagnosis not present

## 2021-04-22 DIAGNOSIS — R0602 Shortness of breath: Secondary | ICD-10-CM

## 2021-04-22 DIAGNOSIS — Z20822 Contact with and (suspected) exposure to covid-19: Secondary | ICD-10-CM | POA: Diagnosis present

## 2021-04-22 DIAGNOSIS — E785 Hyperlipidemia, unspecified: Secondary | ICD-10-CM | POA: Diagnosis present

## 2021-04-22 DIAGNOSIS — F419 Anxiety disorder, unspecified: Secondary | ICD-10-CM | POA: Diagnosis present

## 2021-04-22 DIAGNOSIS — E8770 Fluid overload, unspecified: Secondary | ICD-10-CM | POA: Diagnosis present

## 2021-04-22 DIAGNOSIS — M24411 Recurrent dislocation, right shoulder: Secondary | ICD-10-CM | POA: Diagnosis not present

## 2021-04-22 DIAGNOSIS — N189 Chronic kidney disease, unspecified: Secondary | ICD-10-CM

## 2021-04-22 DIAGNOSIS — S4291XD Fracture of right shoulder girdle, part unspecified, subsequent encounter for fracture with routine healing: Secondary | ICD-10-CM | POA: Diagnosis not present

## 2021-04-22 DIAGNOSIS — S43004S Unspecified dislocation of right shoulder joint, sequela: Secondary | ICD-10-CM

## 2021-04-22 DIAGNOSIS — Z9981 Dependence on supplemental oxygen: Secondary | ICD-10-CM

## 2021-04-22 DIAGNOSIS — Z794 Long term (current) use of insulin: Secondary | ICD-10-CM | POA: Diagnosis not present

## 2021-04-22 DIAGNOSIS — Z7982 Long term (current) use of aspirin: Secondary | ICD-10-CM | POA: Diagnosis not present

## 2021-04-22 DIAGNOSIS — M79606 Pain in leg, unspecified: Secondary | ICD-10-CM | POA: Diagnosis present

## 2021-04-22 DIAGNOSIS — T148XXA Other injury of unspecified body region, initial encounter: Secondary | ICD-10-CM

## 2021-04-22 DIAGNOSIS — E1165 Type 2 diabetes mellitus with hyperglycemia: Secondary | ICD-10-CM | POA: Diagnosis present

## 2021-04-22 DIAGNOSIS — E1140 Type 2 diabetes mellitus with diabetic neuropathy, unspecified: Secondary | ICD-10-CM | POA: Diagnosis present

## 2021-04-22 DIAGNOSIS — R4189 Other symptoms and signs involving cognitive functions and awareness: Secondary | ICD-10-CM | POA: Diagnosis present

## 2021-04-22 DIAGNOSIS — R600 Localized edema: Secondary | ICD-10-CM

## 2021-04-22 HISTORY — PX: SHOULDER CLOSED REDUCTION: SHX1051

## 2021-04-22 LAB — AMMONIA: Ammonia: 28 umol/L (ref 9–35)

## 2021-04-22 LAB — PROTIME-INR
INR: 1.1 (ref 0.8–1.2)
Prothrombin Time: 13.9 seconds (ref 11.4–15.2)

## 2021-04-22 LAB — CBC WITH DIFFERENTIAL/PLATELET
Abs Immature Granulocytes: 0.04 10*3/uL (ref 0.00–0.07)
Basophils Absolute: 0 10*3/uL (ref 0.0–0.1)
Basophils Relative: 0 %
Eosinophils Absolute: 0 10*3/uL (ref 0.0–0.5)
Eosinophils Relative: 0 %
HCT: 51.1 % — ABNORMAL HIGH (ref 36.0–46.0)
Hemoglobin: 15.2 g/dL — ABNORMAL HIGH (ref 12.0–15.0)
Immature Granulocytes: 1 %
Lymphocytes Relative: 13 %
Lymphs Abs: 1.1 10*3/uL (ref 0.7–4.0)
MCH: 28.5 pg (ref 26.0–34.0)
MCHC: 29.7 g/dL — ABNORMAL LOW (ref 30.0–36.0)
MCV: 95.7 fL (ref 80.0–100.0)
Monocytes Absolute: 0.9 10*3/uL (ref 0.1–1.0)
Monocytes Relative: 11 %
Neutro Abs: 6.1 10*3/uL (ref 1.7–7.7)
Neutrophils Relative %: 75 %
Platelets: 214 10*3/uL (ref 150–400)
RBC: 5.34 MIL/uL — ABNORMAL HIGH (ref 3.87–5.11)
RDW: 15.6 % — ABNORMAL HIGH (ref 11.5–15.5)
WBC: 8.1 10*3/uL (ref 4.0–10.5)
nRBC: 1 % — ABNORMAL HIGH (ref 0.0–0.2)

## 2021-04-22 LAB — GLUCOSE, CAPILLARY
Glucose-Capillary: 136 mg/dL — ABNORMAL HIGH (ref 70–99)
Glucose-Capillary: 110 mg/dL — ABNORMAL HIGH (ref 70–99)

## 2021-04-22 LAB — BLOOD GAS, VENOUS
Acid-Base Excess: 3.1 mmol/L — ABNORMAL HIGH (ref 0.0–2.0)
Bicarbonate: 25 mmol/L (ref 20.0–28.0)
FIO2: 36
O2 Saturation: 93 %
Patient temperature: 37
pCO2, Ven: 66 mmHg — ABNORMAL HIGH (ref 44.0–60.0)
pH, Ven: 7.271 (ref 7.250–7.430)
pO2, Ven: 74.9 mmHg — ABNORMAL HIGH (ref 32.0–45.0)

## 2021-04-22 LAB — COMPREHENSIVE METABOLIC PANEL
ALT: 45 U/L — ABNORMAL HIGH (ref 0–44)
AST: 19 U/L (ref 15–41)
Albumin: 3.4 g/dL — ABNORMAL LOW (ref 3.5–5.0)
Alkaline Phosphatase: 132 U/L — ABNORMAL HIGH (ref 38–126)
Anion gap: 7 (ref 5–15)
BUN: 60 mg/dL — ABNORMAL HIGH (ref 6–20)
CO2: 26 mmol/L (ref 22–32)
Calcium: 8.5 mg/dL — ABNORMAL LOW (ref 8.9–10.3)
Chloride: 105 mmol/L (ref 98–111)
Creatinine, Ser: 2.49 mg/dL — ABNORMAL HIGH (ref 0.44–1.00)
GFR, Estimated: 22 mL/min — ABNORMAL LOW (ref >60)
Glucose, Bld: 286 mg/dL — ABNORMAL HIGH (ref 70–99)
Potassium: 4.8 mmol/L (ref 3.5–5.1)
Sodium: 138 mmol/L (ref 135–145)
Total Bilirubin: 0.9 mg/dL (ref 0.3–1.2)
Total Protein: 6 g/dL — ABNORMAL LOW (ref 6.5–8.1)

## 2021-04-22 LAB — BRAIN NATRIURETIC PEPTIDE: B Natriuretic Peptide: 606 pg/mL — ABNORMAL HIGH (ref 0.0–100.0)

## 2021-04-22 LAB — TSH: TSH: 0.856 u[IU]/mL (ref 0.350–4.500)

## 2021-04-22 LAB — LACTIC ACID, PLASMA: Lactic Acid, Venous: 1.1 mmol/L (ref 0.5–1.9)

## 2021-04-22 LAB — APTT: aPTT: 24 seconds (ref 24–36)

## 2021-04-22 LAB — HIV ANTIBODY (ROUTINE TESTING W REFLEX): HIV Screen 4th Generation wRfx: NONREACTIVE

## 2021-04-22 LAB — HEMOGLOBIN A1C
Hgb A1c MFr Bld: 9.5 % — ABNORMAL HIGH (ref 4.8–5.6)
Mean Plasma Glucose: 225.95 mg/dL

## 2021-04-22 LAB — CBG MONITORING, ED: Glucose-Capillary: 155 mg/dL — ABNORMAL HIGH (ref 70–99)

## 2021-04-22 LAB — PROCALCITONIN: Procalcitonin: 0.16 ng/mL

## 2021-04-22 LAB — RESP PANEL BY RT-PCR (FLU A&B, COVID) ARPGX2
Influenza A by PCR: NEGATIVE
Influenza B by PCR: NEGATIVE
SARS Coronavirus 2 by RT PCR: NEGATIVE

## 2021-04-22 LAB — T4, FREE: Free T4: 2.75 ng/dL — ABNORMAL HIGH (ref 0.61–1.12)

## 2021-04-22 LAB — VITAMIN B12: Vitamin B-12: 178 pg/mL — ABNORMAL LOW (ref 180–914)

## 2021-04-22 LAB — FOLATE: Folate: 16.8 ng/mL (ref >5.9)

## 2021-04-22 SURGERY — CLOSED REDUCTION, SHOULDER
Anesthesia: General | Site: Shoulder | Laterality: Right

## 2021-04-22 MED ORDER — ACETAMINOPHEN 325 MG PO TABS
650.0000 mg | ORAL_TABLET | Freq: Four times a day (QID) | ORAL | Status: DC | PRN
  Administered 2021-04-23 – 2021-05-04 (×7): 650 mg via ORAL
  Filled 2021-04-22 (×7): qty 2

## 2021-04-22 MED ORDER — IPRATROPIUM-ALBUTEROL 0.5-2.5 (3) MG/3ML IN SOLN
3.0000 mL | Freq: Four times a day (QID) | RESPIRATORY_TRACT | Status: DC
  Administered 2021-04-22: 3 mL via RESPIRATORY_TRACT
  Filled 2021-04-22: qty 3

## 2021-04-22 MED ORDER — INSULIN GLARGINE-YFGN 100 UNIT/ML ~~LOC~~ SOLN
30.0000 [IU] | Freq: Every day | SUBCUTANEOUS | Status: DC
  Administered 2021-04-22 – 2021-04-25 (×3): 30 [IU] via SUBCUTANEOUS
  Filled 2021-04-22 (×5): qty 0.3

## 2021-04-22 MED ORDER — KETAMINE HCL 10 MG/ML IJ SOLN
INTRAMUSCULAR | Status: DC | PRN
Start: 2021-04-22 — End: 2021-04-22
  Administered 2021-04-22: 30 mg via INTRAVENOUS
  Administered 2021-04-22 (×2): 10 mg via INTRAVENOUS

## 2021-04-22 MED ORDER — CLONAZEPAM 0.5 MG PO TABS
1.0000 mg | ORAL_TABLET | Freq: Two times a day (BID) | ORAL | Status: DC
  Administered 2021-04-22 – 2021-04-23 (×2): 1 mg via ORAL
  Filled 2021-04-22 (×2): qty 2

## 2021-04-22 MED ORDER — MIRABEGRON ER 25 MG PO TB24
25.0000 mg | ORAL_TABLET | Freq: Two times a day (BID) | ORAL | Status: DC
  Administered 2021-04-22 – 2021-05-04 (×22): 25 mg via ORAL
  Filled 2021-04-22 (×22): qty 1

## 2021-04-22 MED ORDER — ARIPIPRAZOLE 10 MG PO TABS
30.0000 mg | ORAL_TABLET | Freq: Every day | ORAL | Status: DC
  Administered 2021-04-22 – 2021-05-04 (×12): 30 mg via ORAL
  Filled 2021-04-22 (×9): qty 3
  Filled 2021-04-22: qty 6
  Filled 2021-04-22 (×2): qty 3

## 2021-04-22 MED ORDER — HEPARIN SODIUM (PORCINE) 5000 UNIT/ML IJ SOLN
5000.0000 [IU] | Freq: Three times a day (TID) | INTRAMUSCULAR | Status: DC
  Administered 2021-04-22 – 2021-05-04 (×33): 5000 [IU] via SUBCUTANEOUS
  Filled 2021-04-22 (×34): qty 1

## 2021-04-22 MED ORDER — KETAMINE HCL 50 MG/5ML IJ SOSY
PREFILLED_SYRINGE | INTRAMUSCULAR | Status: AC
  Filled 2021-04-22: qty 5

## 2021-04-22 MED ORDER — NYSTATIN 100000 UNIT/GM EX POWD
Freq: Two times a day (BID) | CUTANEOUS | Status: DC
  Administered 2021-05-04: 1 via TOPICAL
  Filled 2021-04-22 (×7): qty 15

## 2021-04-22 MED ORDER — ASPIRIN EC 81 MG PO TBEC
81.0000 mg | DELAYED_RELEASE_TABLET | Freq: Every morning | ORAL | Status: DC
  Administered 2021-04-22 – 2021-05-04 (×12): 81 mg via ORAL
  Filled 2021-04-22 (×12): qty 1

## 2021-04-22 MED ORDER — ONDANSETRON HCL 4 MG/2ML IJ SOLN: 4.0000 mg | Freq: Four times a day (QID) | INTRAMUSCULAR | Status: DC | PRN

## 2021-04-22 MED ORDER — CLOTRIMAZOLE 10 MG MT TROC
10.0000 mg | Freq: Every day | OROMUCOSAL | Status: DC
  Administered 2021-04-22 – 2021-05-04 (×53): 10 mg via ORAL
  Filled 2021-04-22 (×72): qty 1

## 2021-04-22 MED ORDER — ISOSORBIDE MONONITRATE ER 60 MG PO TB24
30.0000 mg | ORAL_TABLET | Freq: Every day | ORAL | Status: DC
  Administered 2021-04-22 – 2021-05-04 (×12): 30 mg via ORAL
  Filled 2021-04-22 (×12): qty 1

## 2021-04-22 MED ORDER — ENOXAPARIN SODIUM 30 MG/0.3ML IJ SOSY: 30.0000 mg | PREFILLED_SYRINGE | Freq: Two times a day (BID) | INTRAMUSCULAR | Status: DC

## 2021-04-22 MED ORDER — INSULIN ASPART 100 UNIT/ML IJ SOLN
0.0000 [IU] | Freq: Every day | INTRAMUSCULAR | Status: DC
  Administered 2021-04-24: 3 [IU] via SUBCUTANEOUS
  Administered 2021-04-25 – 2021-04-28 (×4): 5 [IU] via SUBCUTANEOUS
  Administered 2021-04-29: 3 [IU] via SUBCUTANEOUS
  Administered 2021-04-30: 2 [IU] via SUBCUTANEOUS

## 2021-04-22 MED ORDER — SODIUM CHLORIDE 0.9 % IV BOLUS
500.0000 mL | Freq: Once | INTRAVENOUS | Status: AC
  Administered 2021-04-22: 500 mL via INTRAVENOUS

## 2021-04-22 MED ORDER — MIDAZOLAM HCL 2 MG/2ML IJ SOLN
INTRAMUSCULAR | Status: DC | PRN
  Administered 2021-04-22: 1 mg via INTRAVENOUS

## 2021-04-22 MED ORDER — DULOXETINE HCL 60 MG PO CPEP: 60.0000 mg | ORAL_CAPSULE | Freq: Two times a day (BID) | ORAL | Status: DC | PRN

## 2021-04-22 MED ORDER — IPRATROPIUM-ALBUTEROL 0.5-2.5 (3) MG/3ML IN SOLN
3.0000 mL | Freq: Once | RESPIRATORY_TRACT | Status: AC
  Administered 2021-04-22: 3 mL via RESPIRATORY_TRACT
  Filled 2021-04-22: qty 3

## 2021-04-22 MED ORDER — DOCUSATE SODIUM 100 MG PO CAPS
100.0000 mg | ORAL_CAPSULE | Freq: Three times a day (TID) | ORAL | Status: DC
  Administered 2021-04-22 – 2021-05-04 (×32): 100 mg via ORAL
  Filled 2021-04-22 (×32): qty 1

## 2021-04-22 MED ORDER — METHYLPREDNISOLONE SODIUM SUCC 125 MG IJ SOLR
125.0000 mg | Freq: Once | INTRAMUSCULAR | Status: AC
  Administered 2021-04-22: 125 mg via INTRAVENOUS
  Filled 2021-04-22: qty 2

## 2021-04-22 MED ORDER — BIOTIN 10 MG PO CAPS: 1.0000 | ORAL_CAPSULE | Freq: Every day | ORAL | Status: DC

## 2021-04-22 MED ORDER — PROPOFOL 10 MG/ML IV BOLUS
INTRAVENOUS | Status: AC
  Filled 2021-04-22: qty 20

## 2021-04-22 MED ORDER — MIDAZOLAM HCL 2 MG/2ML IJ SOLN
INTRAMUSCULAR | Status: AC
  Filled 2021-04-22: qty 2

## 2021-04-22 MED ORDER — IPRATROPIUM-ALBUTEROL 0.5-2.5 (3) MG/3ML IN SOLN: 3.0000 mL | RESPIRATORY_TRACT | Status: DC | PRN

## 2021-04-22 MED ORDER — POVIDONE-IODINE 10 % EX SWAB: 2.0000 "application " | Freq: Once | CUTANEOUS | Status: DC

## 2021-04-22 MED ORDER — ALBUTEROL SULFATE (2.5 MG/3ML) 0.083% IN NEBU
2.5000 mg | INHALATION_SOLUTION | Freq: Once | RESPIRATORY_TRACT | Status: AC
  Administered 2021-04-22: 2.5 mg via RESPIRATORY_TRACT
  Filled 2021-04-22: qty 3

## 2021-04-22 MED ORDER — INSULIN ASPART 100 UNIT/ML IJ SOLN
0.0000 [IU] | Freq: Three times a day (TID) | INTRAMUSCULAR | Status: DC
  Administered 2021-04-23 (×2): 2 [IU] via SUBCUTANEOUS
  Administered 2021-04-24: 3 [IU] via SUBCUTANEOUS
  Administered 2021-04-25 (×2): 5 [IU] via SUBCUTANEOUS
  Administered 2021-04-26: 8 [IU] via SUBCUTANEOUS
  Administered 2021-04-26 (×2): 11 [IU] via SUBCUTANEOUS
  Administered 2021-04-27: 15 [IU] via SUBCUTANEOUS
  Administered 2021-04-27: 11 [IU] via SUBCUTANEOUS
  Administered 2021-04-27: 8 [IU] via SUBCUTANEOUS
  Administered 2021-04-28: 15 [IU] via SUBCUTANEOUS
  Administered 2021-04-28 (×2): 8 [IU] via SUBCUTANEOUS
  Administered 2021-04-29: 5 [IU] via SUBCUTANEOUS
  Administered 2021-04-29: 8 [IU] via SUBCUTANEOUS
  Administered 2021-04-29 – 2021-04-30 (×4): 5 [IU] via SUBCUTANEOUS
  Administered 2021-05-01: 3 [IU] via SUBCUTANEOUS
  Administered 2021-05-01: 8 [IU] via SUBCUTANEOUS
  Administered 2021-05-01: 3 [IU] via SUBCUTANEOUS
  Administered 2021-05-02: 5 [IU] via SUBCUTANEOUS
  Administered 2021-05-02: 3 [IU] via SUBCUTANEOUS
  Administered 2021-05-02: 8 [IU] via SUBCUTANEOUS
  Administered 2021-05-03: 2 [IU] via SUBCUTANEOUS
  Administered 2021-05-03: 5 [IU] via SUBCUTANEOUS
  Administered 2021-05-04: 2 [IU] via SUBCUTANEOUS
  Administered 2021-05-04: 8 [IU] via SUBCUTANEOUS

## 2021-04-22 MED ORDER — HYDROXYZINE HCL 25 MG PO TABS: 25.0000 mg | ORAL_TABLET | Freq: Three times a day (TID) | ORAL | Status: DC | PRN

## 2021-04-22 MED ORDER — SODIUM CHLORIDE 0.9 % IV SOLN
INTRAVENOUS | Status: DC
Start: 2021-04-22 — End: 2021-04-23

## 2021-04-22 MED ORDER — LACTATED RINGERS IV SOLN: INTRAVENOUS | Status: DC | PRN

## 2021-04-22 MED ORDER — IPRATROPIUM-ALBUTEROL 0.5-2.5 (3) MG/3ML IN SOLN
3.0000 mL | Freq: Three times a day (TID) | RESPIRATORY_TRACT | Status: DC
  Administered 2021-04-23 – 2021-04-26 (×10): 3 mL via RESPIRATORY_TRACT
  Filled 2021-04-22 (×10): qty 3

## 2021-04-22 SURGICAL SUPPLY — 2 items
KIT TURNOVER KIT A (KITS) ×2 IMPLANT
SLING ARM IMMOBILIZER XL (CAST SUPPLIES) ×2 IMPLANT

## 2021-04-22 NOTE — ED Triage Notes (Signed)
EMS reports that patient fell PTA and is complaining of generalized pain prompting her to call 911. Patient continues to have eye swelling as previously noted on last visit when she was treated for allergic reaction. Currently on 6LPM N. EMS reports temp of 101.1 and administered Tylenol 500mg .

## 2021-04-22 NOTE — Consult Note (Signed)
Reason for Consult: Dislocated right shoulder patient deemed unstable for intubation or sedation in the emergency room Referring Physician: dr zammit  [name] is an 55 y.o. female.  HPI: 55 year old female multiple medical problems chronic O2 therapy obesity passed out earlier today fell was brought to the emergency room evaluation noted dislocation of the right shoulder.  The patient medical status prevented intubation and/or sedation in the emergency room  Patient is brought to surgery for closed reduction  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HCC)    Hypertension    Panic attacks    Parkinson's disease    Seizures (HCC)     Past Surgical History:  Procedure Laterality Date   ABDOMINAL HYSTERECTOMY     OOPHORECTOMY      History reviewed. No pertinent family history.  Social History:  reports that she has never smoked. She has never used smokeless tobacco. She reports that she does not drink alcohol and does not use drugs.  Allergies:  Allergies  Allergen Reactions   Bee Venom     Medications: I have reviewed the patient's current medications.  Results for orders placed or performed during the hospital encounter of 04/22/21 (from the past 48 hour(s))  Lactic acid, plasma     Status: None   Collection Time: 04/22/21  8:55 AM  Result Value Ref Range   Lactic Acid, Venous 1.1 0.5 - 1.9 mmol/L    Comment: Performed at [facility], [address], [place], [place] [address]  Comprehensive metabolic panel     Status: Abnormal   Collection Time: 04/22/21  8:55 AM  Result Value Ref Range   Sodium 138 135 - 145 mmol/L   Potassium 4.8 3.5 - 5.1 mmol/L   Chloride 105 98 - 111 mmol/L   CO2 26 22 - 32 mmol/L   Glucose, Bld 286 (H) 70 - 99 mg/dL    Comment: Glucose reference range applies only to samples taken after fasting for at least 8 hours.    BUN 60 (H) 6 - 20 mg/dL   Creatinine, Ser 0.97 (H) 0.44 - 1.00 mg/dL   Calcium 8.5 (L) 8.9 - 10.3 mg/dL   Total Protein 6.0 (L) 6.5 - 8.1 g/dL   Albumin 3.4 (L) 3.5 - 5.0 g/dL   AST 19 15 - 41 U/L   ALT 45 (H) 0 - 44 U/L   Alkaline Phosphatase 132 (H) 38 - 126 U/L   Total Bilirubin 0.9 0.3 - 1.2 mg/dL   GFR, Estimated 22 (L) >60 mL/min    Comment: (NOTE) Calculated using the CKD-EPI Creatinine Equation (2021)    Anion gap 7 5 - 15    Comment: Performed at [facility], [address], [place], [place] [address]  CBC WITH DIFFERENTIAL     Status: Abnormal   Collection Time: 04/22/21  8:55 AM  Result Value Ref Range   WBC 8.1 4.0 - 10.5 K/uL   RBC 5.34 (H) 3.87 - 5.11 MIL/uL   Hemoglobin 15.2 (H) 12.0 - 15.0 g/dL   HCT 92.4 (H) 26.8 - 34.1 %   MCV 95.7 80.0 - 100.0 fL   MCH 28.5 26.0 - 34.0 pg   MCHC 29.7 (L) 30.0 - 36.0 g/dL   RDW 96.2 (H) 22.9 - 79.8 %   Platelets 214 150 - 400 K/uL  nRBC 1.0 (H) 0.0 - 0.2 %   Neutrophils Relative % 75 %   Neutro Abs 6.1 1.7 - 7.7 K/uL   Lymphocytes Relative 13 %   Lymphs Abs 1.1 0.7 - 4.0 K/uL   Monocytes Relative 11 %   Monocytes Absolute 0.9 0.1 - 1.0 K/uL   Eosinophils Relative 0 %   Eosinophils Absolute 0.0 0.0 - 0.5 K/uL   Basophils Relative 0 %   Basophils Absolute 0.0 0.0 - 0.1 K/uL   Immature Granulocytes 1 %   Abs Immature Granulocytes 0.04 0.00 - 0.07 K/uL    Comment: Performed at [facility], [address], [place], [place] [address]  Protime-INR     Status: None   Collection Time: 04/22/21  8:55 AM  Result Value Ref Range   Prothrombin Time 13.9 11.4 - 15.2 seconds   INR 1.1 0.8 - 1.2    Comment: (NOTE) INR goal varies based on device and disease states. Performed at [facility], [address], [place], [place] [address]   APTT     Status: None   Collection Time: 04/22/21  8:55 AM  Result Value Ref Range   aPTT 24 24 - 36 seconds    Comment: Performed at [facility], [address], [place], [place] [address]   Blood culture (routine single)     Status: None (Preliminary result)   Collection Time: 04/22/21  8:55 AM   Specimen: BLOOD  Result Value Ref Range   Specimen Description BLOOD RIGHT HAND    Special Requests      BOTTLES DRAWN AEROBIC AND ANAEROBIC Blood Culture results may not be optimal due to an inadequate volume of blood received in culture bottles Performed at [facility], [address], [place], [place] [address]    Culture PENDING    Report Status PENDING   Procalcitonin - Baseline     Status: None   Collection Time: 04/22/21  8:55 AM  Result Value Ref Range   Procalcitonin 0.16 ng/mL    Comment:        Interpretation: PCT (Procalcitonin) <= 0.5 ng/mL: Systemic infection (sepsis) is not likely. Local bacterial infection is possible. (NOTE)       Sepsis PCT Algorithm           Lower Respiratory Tract                                      Infection PCT Algorithm    ----------------------------     ----------------------------         PCT < 0.25 ng/mL                PCT < 0.10 ng/mL          Strongly encourage             Strongly discourage   discontinuation of antibiotics    initiation of antibiotics    ----------------------------     -----------------------------       PCT 0.25 - 0.50 ng/mL            PCT 0.10 - 0.25 ng/mL               OR       >80% decrease in PCT            Discourage initiation of  antibiotics      Encourage discontinuation           of antibiotics    ----------------------------     -----------------------------         PCT >= 0.50 ng/mL              PCT 0.26 - 0.50 ng/mL               AND        <80% decrease in PCT             Encourage initiation of                                             antibiotics       Encourage continuation           of antibiotics    ----------------------------     -----------------------------        PCT >= 0.50 ng/mL                  PCT > 0.50 ng/mL                AND         increase in PCT                  Strongly encourage                                      initiation of antibiotics    Strongly encourage escalation           of antibiotics                                     -----------------------------                                           PCT <= 0.25 ng/mL                                                 OR                                        > 80% decrease in PCT                                      Discontinue / Do not initiate                                             antibiotics  Performed at [facility], [address], [place], [place] [address]   Brain natriuretic peptide     Status: Abnormal   Collection Time:  04/22/21  8:55 AM  Result Value Ref Range   B Natriuretic Peptide 606.0 (H) 0.0 - 100.0 pg/mL    Comment: Performed at [facility], [address] [place], [place] [address]  Resp Panel by RT-PCR (Flu A&B, Covid) Nasopharyngeal Swab     Status: None   Collection Time: 04/22/21  9:27 AM   Specimen: Nasopharyngeal Swab; Nasopharyngeal(NP) swabs in vial transport medium  Result Value Ref Range   SARS Coronavirus 2 by RT PCR NEGATIVE NEGATIVE    Comment: (NOTE) SARS-CoV-2 target nucleic acids are NOT DETECTED.  The SARS-CoV-2 RNA is generally detectable in upper respiratory specimens during the acute phase of infection. The lowest concentration of SARS-CoV-2 viral copies this assay can detect is 138 copies/mL. A negative result does not preclude SARS-Cov-2 infection and should not be used as the sole basis for treatment or other patient management decisions. A negative result may occur with  improper specimen collection/handling, submission of specimen other than nasopharyngeal swab, presence of viral mutation(s) within the areas targeted by this assay, and inadequate number of viral copies(<138 copies/mL). A negative result must be combined with clinical observations, patient history, and  epidemiological information. The expected result is Negative.  Fact Sheet for Patients:  BloggerCourse.com  Fact Sheet for Healthcare Providers:  SeriousBroker.it  This test is no t yet approved or cleared by the [place] FDA and  has been authorized for detection and/or diagnosis of SARS-CoV-2 by FDA under an Emergency Use Authorization (EUA). This EUA will remain  in effect (meaning this test can be used) for the duration of the COVID-19 declaration under Section 564(b)(1) of the Act, 21 U.S.C.section 360bbb-3(b)(1), unless the authorization is terminated  or revoked sooner.       Influenza A by PCR NEGATIVE NEGATIVE   Influenza B by PCR NEGATIVE NEGATIVE    Comment: (NOTE) The Xpert Xpress SARS-CoV-2/FLU/RSV plus assay is intended as an aid in the diagnosis of influenza from Nasopharyngeal swab specimens and should not be used as a sole basis for treatment. Nasal washings and aspirates are unacceptable for Xpert Xpress SARS-CoV-2/FLU/RSV testing.  Fact Sheet for Patients: BloggerCourse.com  Fact Sheet for Healthcare Providers: SeriousBroker.it  This test is not yet approved or cleared by the [place] FDA and has been authorized for detection and/or diagnosis of SARS-CoV-2 by FDA under an Emergency Use Authorization (EUA). This EUA will remain in effect (meaning this test can be used) for the duration of the COVID-19 declaration under Section 564(b)(1) of the Act, 21 U.S.C. section 360bbb-3(b)(1), unless the authorization is terminated or revoked.  Performed at [facility], [address], [place], [place] [address]   Blood gas, venous     Status: Abnormal   Collection Time: 04/22/21  1:52 PM  Result Value Ref Range   FIO2 36.00    pH, Ven 7.271 7.250 - 7.430   pCO2, Ven 66.0 (H) 44.0 - 60.0 mmHg   pO2, Ven 74.9 (H) 32.0 - 45.0 mmHg   Bicarbonate 25.0 20.0 -  28.0 mmol/L   Acid-Base Excess 3.1 (H) 0.0 - 2.0 mmol/L   O2 Saturation 93.0 %   Patient temperature 37.0     Comment: Performed at [facility], [address] [place], [place] [address]    DG Chest 1 View  Result Date: 04/22/2021 CLINICAL DATA:  Fall today. PT c/o pain in pelvis and Rt shoulder/Rt upper chest since fall. Hx COPD, nonsmoker, diabetes, HTN, obesity EXAM: CHEST  1 VIEW COMPARISON:  04/15/2021 FINDINGS:  Lungs are clear. Heart size upper limits normal. Aortic Atherosclerosis (ICD10-170.0). No effusion.  No pneumothorax. Right shoulder dislocation IMPRESSION: No acute cardiopulmonary disease. Right shoulder dislocation Electronically Signed   By: Corlis Leak  Hassell M.D.   On: 04/22/2021 10:10   DG Pelvis 1-2 Views  Result Date: 04/22/2021 CLINICAL DATA:  Pain post fall EXAM: PELVIS - 1-2 VIEW COMPARISON:  CT 04/11/2014 FINDINGS: There is no evidence of pelvic fracture or diastasis. The right femoral neck is not well profiled. Chronic osteitis pubis. Degenerative changes in the lower lumbar spine. Surgical clips in the left lower quadrant. Bilateral pelvic phleboliths. No pelvic bone lesions are seen. IMPRESSION: No acute findings. Electronically Signed   By: Corlis Leak  Hassell M.D.   On: 04/22/2021 10:12   DG Shoulder Right  Result Date: 04/22/2021 CLINICAL DATA:  Pain post fall EXAM: RIGHT SHOULDER - 2+ VIEW COMPARISON:  None. FINDINGS: Anterior right shoulder dislocation. Small cortical fragments project at the posterior margin of the glenoid. Proximal humerus appears intact. IMPRESSION: Anterior shoulder dislocation Electronically Signed   By: Corlis Leak  Hassell M.D.   On: 04/22/2021 10:13   CT Head Wo Contrast  Result Date: 04/22/2021 CLINICAL DATA:  Larey SeatFell, pain EXAM: CT HEAD WITHOUT CONTRAST TECHNIQUE: Contiguous axial images were obtained from the base of the skull through the vertex without intravenous contrast. COMPARISON:  02/07/2012 FINDINGS: Brain: Mild parenchymal atrophy. Patchy areas of  hypoattenuation in deep and periventricular white matter bilaterally. Negative for acute intracranial hemorrhage, mass lesion, acute infarction, midline shift, or mass-effect. Acute infarct may be inapparent on noncontrast CT. Ventricles and sulci symmetric. Vascular: No hyperdense vessel or unexpected calcification. Skull: Normal. Negative for fracture or focal lesion. Sinuses/Orbits: No acute finding. Other: None IMPRESSION: 1. Negative for bleed or other acute intracranial process. 2. Atrophy and nonspecific white matter changes. Electronically Signed   By: Corlis Leak  Hassell M.D.   On: 04/22/2021 10:09    Review of Systems Blood pressure 124/72, pulse 94, temperature 98.6 F (37 C), temperature source Oral, resp. rate 19, height 5\' 8"  (1.727 m), weight 126.1 kg, SpO2 93 %. Physical Exam  Physical Exam(=30) BP 124/72   Pulse 94   Temp 98.6 F (37 C) (Oral)   Resp 19   Ht 5\' 8"  (1.727 m)   Wt 126.1 kg   SpO2 93%   BMI 42.27 kg/m   Gen. Appearance Short neck morbid obesity Peripheral vascular system peripheral edema intact pulses Lymph nodes ARE NORMAL  Gait could not assess patient lying flat on the bed but dislocated shoulder  Left Upper extremity  Inspection revealed no malalignment or asymmetry  Assessment of range of motion: Full range of motion was recorded  Assessment of stability: Elbow wrist and hand and shoulder were stable  Assessment of muscle strength and tone revealed grade 5 muscle strength and normal muscle tone  Skin was normal without rash lesion or ulceration  Right upper extremity Skin over the right shoulder is normal although this patient has multiple areas of subcutaneous purulent bumps Deformity right shoulder with palpable tenderness Range of motion deferred stability test deferred muscle strength and tone deferred although the patient could still move her fingers   Right Lower extremity  Inspection revealed no malalignment or asymmetry  Assessment of range of  motion: Full range of motion was recorded  Assessment of stability: Ankle, knee and hip were stable  Assessment of muscle strength and tone revealed grade 5 muscle strength and normal muscle tone  Skin was normal without rash lesion  or ulceration  Left lower extremity Inspection revealed no malalignment or asymmetry Assessment of range of motion: Full range of motion was recorded Assessment of stability: Ankle, knee and hip were stable Assessment of muscle strength and tone revealed grade 5 muscle strength and normal muscle tone Skin was normal without rash lesion or ulceration  Coordination deferred because of pain and injury  No reflex exam was done because of pain and injury   Examination of sensation by touch was normal, specifically no anterior interosseous nerve function and deltoid sensory function  Mental status  Oriented to time person and place normal  Mood and affect normal without depression anxiety or agitation  Dx:   Data Reviewed  ER RECORD REVIEWED: CONFIRMS HISTORY   I reviewed the following images and the reports and my independent interpretation is x-ray right shoulder 1 view inferior anterior dislocation of the shoulder   Assessment/Plan: Right shoulder close dislocation  General anesthesia for closed reduction, as I discussed with the patient there are some risks for her to be intubated secondary to her COPD but this is the best option given the circumstances  The procedure has been fully reviewed with the patient; The risks and benefits of surgery have been discussed and explained and understood. Alternative treatment has also been reviewed, questions were encouraged and answered. The postoperative plan is also been reviewed.   Patient will then be admitted to the medical service for AKI hypoxia and other medical issues  Fuller Canada 04/22/2021, 2:20 PM

## 2021-04-22 NOTE — Brief Op Note (Signed)
04/22/2021  4:05 PM  PATIENT:  [name]  55 y.o. female  PRE-OPERATIVE DIAGNOSIS:  right shoulder dislocation  POST-OPERATIVE DIAGNOSIS:  right shoulder dislocation  PROCEDURE:  Procedure(s): CLOSED REDUCTION SHOULDER (Right)  55 year old female hypoxia chronic oxygen diabetes obesity fell and injured her right shoulder with a dislocation taken to the operating room for closed reduction  Once in the operating room and timeout was taken the patient was given ketamine 2 doses and the second dose relaxer and I first used the traction countertraction method with a sheet in the axilla to reduce the shoulder  We took several x-rays including fluoroscopy to confirm reduction  Patient taken to the recovery room in stable condition  SURGEON:  Surgeon(s) and Role:    Vickki Hearing, MD - Primary  PHYSICIAN ASSISTANT:   ASSISTANTS: none   ANESTHESIA:   IV sedation  EBL:  0 mL   BLOOD ADMINISTERED:none  DRAINS: none   LOCAL MEDICATIONS USED:  NONE  SPECIMEN:  No Specimen  DISPOSITION OF SPECIMEN:  N/A  COUNTS:  YES  TOURNIQUET:  * No tourniquets in log *  DICTATION: .Dragon Dictation  PLAN OF CARE: Admit to inpatient   PATIENT DISPOSITION:  PACU - hemodynamically stable.   Delay start of Pharmacological VTE agent (>24hrs) due to surgical blood loss or risk of bleeding: no

## 2021-04-22 NOTE — Anesthesia Preprocedure Evaluation (Addendum)
Anesthesia Evaluation  Patient identified by MRN, date of birth, ID band Patient awake    Reviewed: Allergy & Precautions, H&P , NPO status , Patient's Chart, lab work & pertinent test results, reviewed documented beta blocker date and time   Airway Mallampati: II  TM Distance: >3 FB Neck ROM: full    Dental no notable dental hx. (+) Teeth Intact   Pulmonary shortness of breath, COPD,    Pulmonary exam normal breath sounds clear to auscultation       Cardiovascular Exercise Tolerance: Good hypertension, negative cardio ROS   Rhythm:regular Rate:Normal     Neuro/Psych Seizures -,  PSYCHIATRIC DISORDERS Anxiety Bipolar Disorder    GI/Hepatic negative GI ROS, Neg liver ROS,   Endo/Other  negative endocrine ROSdiabetes  Renal/GU ARFRenal disease  negative genitourinary   Musculoskeletal  (+) Arthritis ,   Abdominal   Peds  Hematology negative hematology ROS (+)   Anesthesia Other Findings   Reproductive/Obstetrics negative OB ROS                            Anesthesia Physical Anesthesia Plan  ASA: 3 and emergent  Anesthesia Plan: General   Post-op Pain Management:    Induction:   PONV Risk Score and Plan: Propofol infusion  Airway Management Planned:   Additional Equipment:   Intra-op Plan:   Post-operative Plan:   Informed Consent: I have reviewed the patients History and Physical, chart, labs and discussed the procedure including the risks, benefits and alternatives for the proposed anesthesia with the patient or authorized representative who has indicated his/her understanding and acceptance.     Dental Advisory Given  Plan Discussed with: CRNA  Anesthesia Plan Comments:         Anesthesia Quick Evaluation

## 2021-04-22 NOTE — Anesthesia Postprocedure Evaluation (Signed)
Anesthesia Post Note  Patient: LYDIANN BONIFAS  Procedure(s) Performed: CLOSED REDUCTION SHOULDER (Right: Shoulder)  Patient location during evaluation: PACU Anesthesia Type: General Level of consciousness: awake and alert Pain management: pain level controlled Vital Signs Assessment: post-procedure vital signs reviewed and stable Respiratory status: spontaneous breathing, nonlabored ventilation, respiratory function stable and patient connected to nasal cannula oxygen Cardiovascular status: blood pressure returned to baseline and stable Postop Assessment: no apparent nausea or vomiting Anesthetic complications: no   No notable events documented.   Last Vitals:  Vitals:   04/22/21 1615 04/22/21 1620  BP: 125/80 (!) 123/93  Pulse: 96 94  Resp: 20 18  Temp:  37.5 C  SpO2: 91% 92%    Last Pain:  Vitals:   04/22/21 1620  TempSrc: Oral  PainSc:                  Windell Norfolk

## 2021-04-22 NOTE — H&P (Signed)
History and Physical  [name] UEA:[id number] DOB: 1965/11/05 DOA: 04/22/2021   PCP: [facility]   Patient coming from: Home  Chief Complaint: fall and sob  HPI:  [name] is a 55 y.o. female with medical history of current impairment, bipolar disorder, seizure disorder, hypertension, COPD, chronic respiratory failure on 3 L, diabetes mellitus type 2, CKD stage III presenting with mechanical fall on the a.m. of 04/22/2021.  The patient is a difficult historian secondary to cognitive impairment.  History is also supplemented by the patient's spouse.  Unfortunately, he is also a difficult historian.  Nevertheless, it appears that the patient had a mechanical fall getting out of bed on the morning of 04/22/2021.  She has not had a history of frequent falls.  She last fell over 1 year prior to this admission.  There was no loss of consciousness this morning.  After the fall, the patient was complaining of pain all over.  As result, the patient was brought to emergency department for further evaluation.  Notably, the patient was noted to have a fever by EMS of 101.0 F.  She was given acetaminophen by EMS.  Prior to my assessment, the patient was noted to have an increasing oxygen requirement.  She was placed on 6 L nasal cannula.  Her spouse relates that the patient has been having more dyspnea on exertion over the past 3 to 4 weeks.  Apparently, she had a telehealth visit with her PCP a couple weeks prior to this admission.  She was given some type of medication which the spouse and patient do not recall.  There was some mild improvement in her breathing, but she complains of shortness of breath with exertion once again.  She denies any chest pain, headache, nausea, vomiting, diarrhea, abdominal pain.  In the emergency department, the patient was afebrile hemodynamically stable.  BMP shows sodium 138, potassium 4.8, bicarbonate 26, BUN 60, serum creatinine 2.49.   WBC 8.1, hemoglobin 15.2, platelets 214,000.  EKG shows sinus tachycardia with right bundle branch block.  CT of the brain was negative.  Chest x-ray showed no consolidation but increased interstitial markings.  X-ray of the right shoulder showed anterior dislocation.  Orthopedics, Dr. Romeo Apple was consulted.  Pelvic x-ray was negative for fracture or dislocation.  Assessment/Plan: Acute on chronic respiratory failure with hypoxia -Obtain VBG -Personally reviewed chest x-ray--chronic interstitial markings -Check BNP -Suspect this is likely due to the patient's OSA/OHS -She has never smoked -Chronically on 3 L nasal cannula -Currently on 4 L -Goal is to keep saturation greater 90% -CT chest -Echo -VBG  Anterior right shoulder dislocation -Orthopedics, Dr. Romeo Apple consulted -Judicious opioids/pain control  Acute on chronic renal failure--CKD 3a -Difficult to clarify whether the patient has a degree of progression of underlying CKD -Previous baseline creatinine 1.0-1.4 -Renal ultrasound -UA  Fever -Check PCT -Lactic acid 1.1 -Follow blood cultures -Obtain UA and urine culture  Uncontrolled diabetes mellitus type 2 with hyperglycemia -Check A1c -Start reduced dose Lantus -NovoLog sliding scale  Bipolar disorder -Continue klonopin -Minimize hypnotic medications -Continue Abilify, Cymbalta        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HCC)    Hypertension    Panic attacks    Parkinson's disease    Seizures (HCC)  Past Surgical History:  Procedure Laterality Date   ABDOMINAL HYSTERECTOMY     OOPHORECTOMY     Social History:  reports that she has never smoked. She has never used smokeless tobacco. She reports that she does not drink alcohol and does not use drugs.   History reviewed. No pertinent family history.   Allergies   Allergen Reactions   Bee Venom      Prior to Admission medications   Medication Sig Start Date End Date Taking? Authorizing Provider  ARIPiprazole (ABILIFY) 30 MG tablet Take 30 mg by mouth every morning.      [provider]  aspirin EC 81 MG tablet Take 81 mg by mouth every morning.      [provider]  Biotin 10 MG CAPS Take 1 capsule by mouth daily.    [provider]  calcium carbonate (OS-CAL) 600 MG TABS Take 600 mg by mouth 2 (two) times daily with a meal.      [provider]  Cholecalciferol (VITAMIN D3) 50 MCG (2000 UT) TABS Take 1 tablet by mouth daily.    [provider]  clonazePAM (KLONOPIN) 1 MG tablet Take 1 mg by mouth 2 (two) times daily.     [provider]  divalproex (DEPAKOTE) 250 MG DR tablet Take 1,500 mg by mouth at bedtime.  Patient not taking: Reported on 04/15/2021    [provider]  docusate sodium (COLACE) 100 MG capsule Take 100 mg by mouth 3 (three) times daily.      [provider]  DULoxetine (CYMBALTA) 60 MG capsule Take 60 mg by mouth 2 (two) times daily as needed (mood).    [provider]  gabapentin (NEURONTIN) 300 MG capsule Take 1 capsule (300 mg total) by mouth 3 (three) times daily. 04/20/14   Kathlen Mody, MD  gemfibrozil (LOPID) 600 MG tablet Take 600 mg by mouth 2 (two) times daily before a meal.   Patient not taking: Reported on 04/15/2021    [provider]  glipiZIDE (GLUCOTROL) 10 MG tablet Take 10 mg by mouth 2 (two) times daily before a meal.   Patient not taking: Reported on 04/15/2021    [provider]  hydrOXYzine (ATARAX/VISTARIL) 25 MG tablet TAKE 1 TABLET THREE TIMES DAILY AS NEEDED FOR ANXIETY    [provider]  insulin aspart (NOVOLOG) 100 UNIT/ML injection CBG < 70: implement hypoglycemia protocol CBG 70 - 120: 0 units CBG 121 - 150: 3 units CBG 151 - 200: 4 units CBG 201 - 250: 7 units CBG 251 - 300: 11 units CBG 301  - 350: 15 units CBG 351 - 400: 20 units 04/20/14   Kathlen Mody, MD  insulin glargine (LANTUS) 100 UNIT/ML injection Inject 0.6 mLs (60 Units total) into the skin daily. 04/20/14   Kathlen Mody, MD  isosorbide mononitrate (IMDUR) 30 MG 24 hr tablet Take 1 tablet (30 mg total) by mouth daily. 04/20/14   Kathlen Mody, MD  liraglutide (VICTOZA) 18 MG/3ML SOPN USE 1.8 MG SUBCUTANEOUSLY ONCE DAILY.    [provider]  lisinopril (ZESTRIL) 2.5 MG tablet Take 2.5 mg by mouth in the morning and at bedtime.    [provider]  magnesium chloride (SLOW-MAG) 64 MG TBEC SR tablet Take 1 tablet by mouth daily.    [provider]  metoprolol (TOPROL-XL) 50 MG 24 hr tablet Take 50 mg by mouth every morning.  Patient not taking: Reported on 04/15/2021    [provider]  mirabegron ER (MYRBETRIQ) 25 MG TB24 tablet Take 1 tablet by mouth 2 (two) times daily.    [provider]  Multiple Vitamins-Minerals (MULTIVITAMINS THER. W/MINERALS) TABS Take 1 tablet by mouth every morning.   Patient not taking: Reported on 04/15/2021    [provider]  oxybutynin (DITROPAN) 5 MG tablet Take 5 mg by mouth every morning.   Patient not taking: Reported on 04/15/2021    [provider]  pravastatin (PRAVACHOL) 40 MG tablet Take by mouth. Patient not taking: Reported on 04/15/2021    [provider]  predniSONE (DELTASONE) 20 MG tablet 2 tabs po daily x 3 days 04/15/21   Bethann Berkshire, MD  propranolol (INDERAL) 60 MG tablet Take 60 mg by mouth daily. On an empty stomach    [provider]  repaglinide (PRANDIN) 0.5 MG tablet Take 0.5 mg by mouth 3 (three) times daily before meals. Patient not taking: Reported on 04/15/2021    [provider]  traMADol (ULTRAM) 50 MG tablet Take 50 mg by mouth at bedtime. For pain Patient not taking: Reported on 04/15/2021    [provider]    Review of Systems:  Constitutional:  No weight loss,  night sweats,  Head&Eyes: No headache.  No vision loss.  No eye pain or scotoma ENT:  No Difficulty swallowing,Tooth/dental problems,Sore throat,  No ear ache, post nasal drip,  Cardio-vascular:  No chest pain, Orthopnea, PND, swelling in lower extremities,  dizziness, palpitations  GI:  No  abdominal pain, nausea, vomiting, diarrhea, loss of appetite, hematochezia, melena, heartburn, indigestion, Resp:  No cough. No coughing up of blood .No wheezing.No chest wall deformity  Skin:  no rash or lesions.  GU:  no dysuria, change in color of urine, no urgency or frequency. No flank pain.  Musculoskeletal:  No joint pain or swelling. No decreased range of motion. No back pain.  Psych:  No change in mood or affect. No depression or anxiety. Neurologic: No headache, no dysesthesia, no focal weakness, no vision loss. No syncope  Physical Exam: Vitals:   04/22/21 1015 04/22/21 1030 04/22/21 1054 04/22/21 1130  BP:  126/77  124/72  Pulse: 92 92  94  Resp: (!) 22 (!) 21  19  Temp:      TempSrc:      SpO2: 98% 96% 94% 93%  Weight:      Height:       General:  A&O x 1, NAD, nontoxic, pleasant/cooperative Head/Eye: No conjunctival hemorrhage, no icterus, [place]/AT, No nystagmus ENT:  No icterus,  No thrush, good dentition, no pharyngeal exudate Neck:  No masses, no lymphadenpathy, no bruits CV:  RRR, no rub, no gallop, no S3 Lung:  bibasilar crackles. No wheeze Abdomen: soft/NT, +BS, nondistended, no peritoneal signs Ext: No cyanosis, No rashes, No petechiae, No lymphangitis, Nonpitting edema Neuro: CNII-XII intact, strength 4/5 in bilateral upper and lower extremities, no dysmetria  Labs on Admission:  Basic Metabolic Panel: Recent Labs  Lab 04/15/21 1528 04/22/21 0855  NA 141 138  K 4.9 4.8  CL 106 105  CO2 28 26  GLUCOSE 218* 286*  BUN 45* 60*  CREATININE 1.96* 2.49*  CALCIUM 8.7* 8.5*   Liver Function Tests: Recent Labs  Lab 04/15/21 1528 04/22/21 0855  AST 33 19   ALT 48* 45*  ALKPHOS 134* 132*  BILITOT 0.6 0.9  PROT 6.1* 6.0*  ALBUMIN 3.2* 3.4*   No results for input(s): LIPASE, AMYLASE in the last 168 hours. No results  for input(s): AMMONIA in the last 168 hours. CBC: Recent Labs  Lab 04/15/21 1528 04/22/21 0855  WBC 6.1 8.1  NEUTROABS 4.1 6.1  HGB 14.4 15.2*  HCT 47.9* 51.1*  MCV 96.4 95.7  PLT 260 214   Coagulation Profile: Recent Labs  Lab 04/22/21 0855  INR 1.1   Cardiac Enzymes: No results for input(s): CKTOTAL, CKMB, CKMBINDEX, TROPONINI in the last 168 hours. BNP: Invalid input(s): POCBNP CBG: No results for input(s): GLUCAP in the last 168 hours. Urine analysis:    Component Value Date/Time   COLORURINE YELLOW (A) 05/01/2017 1852   APPEARANCEUR CLEAR (A) 05/01/2017 1852   LABSPEC 1.023 05/01/2017 1852   PHURINE 5.0 05/01/2017 1852   GLUCOSEU >=500 (A) 05/01/2017 1852   HGBUR NEGATIVE 05/01/2017 1852   BILIRUBINUR NEGATIVE 05/01/2017 1852   KETONESUR NEGATIVE 05/01/2017 1852   PROTEINUR 100 (A) 05/01/2017 1852   UROBILINOGEN 0.2 04/11/2014 1927   NITRITE NEGATIVE 05/01/2017 1852   LEUKOCYTESUR NEGATIVE 05/01/2017 1852   Sepsis Labs: (procalcitonin:4,lacticidven:4) ) Recent Results (from the past 240 hour(s))  Resp Panel by RT-PCR (Flu A&B, Covid) Nasopharyngeal Swab     Status: None   Collection Time: 04/15/21  3:19 PM   Specimen: Nasopharyngeal Swab; Nasopharyngeal(NP) swabs in vial transport medium  Result Value Ref Range Status   SARS Coronavirus 2 by RT PCR NEGATIVE NEGATIVE Final    Comment: (NOTE) SARS-CoV-2 target nucleic acids are NOT DETECTED.  The SARS-CoV-2 RNA is generally detectable in upper respiratory specimens during the acute phase of infection. The lowest concentration of SARS-CoV-2 viral copies this assay can detect is 138 copies/mL. A negative result does not preclude SARS-Cov-2 infection and should not be used as the sole basis for treatment or other patient management  decisions. A negative result may occur with  improper specimen collection/handling, submission of specimen other than nasopharyngeal swab, presence of viral mutation(s) within the areas targeted by this assay, and inadequate number of viral copies(<138 copies/mL). A negative result must be combined with clinical observations, patient history, and epidemiological information. The expected result is Negative.  Fact Sheet for Patients:  BloggerCourse.com  Fact Sheet for Healthcare Providers:  SeriousBroker.it  This test is no t yet approved or cleared by the [place] FDA and  has been authorized for detection and/or diagnosis of SARS-CoV-2 by FDA under an Emergency Use Authorization (EUA). This EUA will remain  in effect (meaning this test can be used) for the duration of the COVID-19 declaration under Section 564(b)(1) of the Act, 21 U.S.C.section 360bbb-3(b)(1), unless the authorization is terminated  or revoked sooner.       Influenza A by PCR NEGATIVE NEGATIVE Final   Influenza B by PCR NEGATIVE NEGATIVE Final    Comment: (NOTE) The Xpert Xpress SARS-CoV-2/FLU/RSV plus assay is intended as an aid in the diagnosis of influenza from Nasopharyngeal swab specimens and should not be used as a sole basis for treatment. Nasal washings and aspirates are unacceptable for Xpert Xpress SARS-CoV-2/FLU/RSV testing.  Fact Sheet for Patients: BloggerCourse.com  Fact Sheet for Healthcare Providers: SeriousBroker.it  This test is not yet approved or cleared by the [place] FDA and has been authorized for detection and/or diagnosis of SARS-CoV-2 by FDA under an Emergency Use Authorization (EUA). This EUA will remain in effect (meaning this test can be used) for the duration of the COVID-19 declaration under Section 564(b)(1) of the Act, 21 U.S.C. section 360bbb-3(b)(1), unless the  authorization is terminated or revoked.  Performed at [facility], [address] [place], [place] [address]   Blood culture (routine single)     Status: None (Preliminary result)   Collection Time: 04/22/21  8:55 AM   Specimen: BLOOD  Result Value Ref Range Status   Specimen Description BLOOD RIGHT HAND  Final   Special Requests   Final    BOTTLES DRAWN AEROBIC AND ANAEROBIC Blood Culture results may not be optimal due to an inadequate volume of blood received in culture bottles Performed at [facility], [address] [place], [place] [address]    Culture PENDING  Incomplete   Report Status PENDING  Incomplete  Resp Panel by RT-PCR (Flu A&B, Covid) Nasopharyngeal Swab     Status: None   Collection Time: 04/22/21  9:27 AM   Specimen: Nasopharyngeal Swab; Nasopharyngeal(NP) swabs in vial transport medium  Result Value Ref Range Status   SARS Coronavirus 2 by RT PCR NEGATIVE NEGATIVE Final    Comment: (NOTE) SARS-CoV-2 target nucleic acids are NOT DETECTED.  The SARS-CoV-2 RNA is generally detectable in upper respiratory specimens during the acute phase of infection. The lowest concentration of SARS-CoV-2 viral copies this assay can detect is 138 copies/mL. A negative result does not preclude SARS-Cov-2 infection and should not be used as the sole basis for treatment or other patient management decisions. A negative result may occur with  improper specimen collection/handling, submission of specimen other than nasopharyngeal swab, presence of viral mutation(s) within the areas targeted by this assay, and inadequate number of viral copies(<138 copies/mL). A negative result must be combined with clinical observations, patient history, and epidemiological information. The expected result is Negative.  Fact Sheet for Patients:  BloggerCourse.comhttps://www.fda.gov/media/152166/download  Fact Sheet for Healthcare Providers:  SeriousBroker.ithttps://www.fda.gov/media/152162/download  This test is no t yet  approved or cleared by the [place] FDA and  has been authorized for detection and/or diagnosis of SARS-CoV-2 by FDA under an Emergency Use Authorization (EUA). This EUA will remain  in effect (meaning this test can be used) for the duration of the COVID-19 declaration under Section 564(b)(1) of the Act, 21 U.S.C.section 360bbb-3(b)(1), unless the authorization is terminated  or revoked sooner.       Influenza A by PCR NEGATIVE NEGATIVE Final   Influenza B by PCR NEGATIVE NEGATIVE Final    Comment: (NOTE) The Xpert Xpress SARS-CoV-2/FLU/RSV plus assay is intended as an aid in the diagnosis of influenza from Nasopharyngeal swab specimens and should not be used as a sole basis for treatment. Nasal washings and aspirates are unacceptable for Xpert Xpress SARS-CoV-2/FLU/RSV testing.  Fact Sheet for Patients: BloggerCourse.comhttps://www.fda.gov/media/152166/download  Fact Sheet for Healthcare Providers: SeriousBroker.ithttps://www.fda.gov/media/152162/download  This test is not yet approved or cleared by the [place] FDA and has been authorized for detection and/or diagnosis of SARS-CoV-2 by FDA under an Emergency Use Authorization (EUA). This EUA will remain in effect (meaning this test can be used) for the duration of the COVID-19 declaration under Section 564(b)(1) of the Act, 21 U.S.C. section 360bbb-3(b)(1), unless the authorization is terminated or revoked.  Performed at [facility], [address] [place], [place] [address]      Radiological Exams on Admission: DG Chest 1 View  Result Date: 04/22/2021 CLINICAL DATA:  Fall today. PT c/o pain in pelvis and Rt shoulder/Rt upper chest since fall. Hx COPD, nonsmoker, diabetes, HTN, obesity EXAM: CHEST  1 VIEW COMPARISON:  04/15/2021 FINDINGS: Lungs are clear. Heart size upper limits normal. Aortic Atherosclerosis (ICD10-170.0). No effusion.  No pneumothorax. Right shoulder dislocation IMPRESSION: No acute  cardiopulmonary disease. Right shoulder  dislocation Electronically Signed   By: Corlis Leak M.D.   On: 04/22/2021 10:10   DG Pelvis 1-2 Views  Result Date: 04/22/2021 CLINICAL DATA:  Pain post fall EXAM: PELVIS - 1-2 VIEW COMPARISON:  CT 04/11/2014 FINDINGS: There is no evidence of pelvic fracture or diastasis. The right femoral neck is not well profiled. Chronic osteitis pubis. Degenerative changes in the lower lumbar spine. Surgical clips in the left lower quadrant. Bilateral pelvic phleboliths. No pelvic bone lesions are seen. IMPRESSION: No acute findings. Electronically Signed   By: Corlis Leak M.D.   On: 04/22/2021 10:12   DG Shoulder Right  Result Date: 04/22/2021 CLINICAL DATA:  Pain post fall EXAM: RIGHT SHOULDER - 2+ VIEW COMPARISON:  None. FINDINGS: Anterior right shoulder dislocation. Small cortical fragments project at the posterior margin of the glenoid. Proximal humerus appears intact. IMPRESSION: Anterior shoulder dislocation Electronically Signed   By: Corlis Leak M.D.   On: 04/22/2021 10:13   CT Head Wo Contrast  Result Date: 04/22/2021 CLINICAL DATA:  Larey Seat, pain EXAM: CT HEAD WITHOUT CONTRAST TECHNIQUE: Contiguous axial images were obtained from the base of the skull through the vertex without intravenous contrast. COMPARISON:  02/07/2012 FINDINGS: Brain: Mild parenchymal atrophy. Patchy areas of hypoattenuation in deep and periventricular white matter bilaterally. Negative for acute intracranial hemorrhage, mass lesion, acute infarction, midline shift, or mass-effect. Acute infarct may be inapparent on noncontrast CT. Ventricles and sulci symmetric. Vascular: No hyperdense vessel or unexpected calcification. Skull: Normal. Negative for fracture or focal lesion. Sinuses/Orbits: No acute finding. Other: None IMPRESSION: 1. Negative for bleed or other acute intracranial process. 2. Atrophy and nonspecific white matter changes. Electronically Signed   By: Corlis Leak M.D.   On: 04/22/2021 10:09    EKG: Independently reviewed.  sinus    Time spent:60 minutes Code Status:   FULL Family Communication:  son and spouse updated Disposition Plan: expect 2 day hospitalization Consults called: none DVT Prophylaxis: [place] Lovenox  Catarina Hartshorn, DO  [facility] Pager [phone]  If 7PM-7AM, please contact night-coverage [email or web address] Password [facility] 04/22/2021, 12:44 PM

## 2021-04-22 NOTE — ED Provider Notes (Signed)
[facility] EMERGENCY DEPARTMENT Provider Note   CSN: [id number] Arrival date & time: 04/22/21  4034     History Chief Complaint  Patient presents with   [name] is a 55 y.o. female.  Patient states she has been short of breath and weak and took a fall and hit her right shoulder no loss of consciousness.  Patient has been on prednisone for swelling around her eye for "allergic reaction.  Patient also had a temperature of 101 by EMS but not in the emergency department  The history is provided by the patient and medical records. No language interpreter was used.  Fall This is a new problem. The current episode started 3 to 5 hours ago. The problem occurs rarely. The problem has been resolved. Associated symptoms include shortness of breath. Pertinent negatives include no chest pain, no abdominal pain and no headaches. Nothing aggravates the symptoms. Nothing relieves the symptoms. She has tried nothing for the symptoms.      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     History reviewed. No  pertinent family history.  Social History   Tobacco Use   Smoking status: Never   Smokeless tobacco: Never  Substance Use Topics   Alcohol use: No   Drug use: No    Home Medications Prior to Admission medications   Medication Sig Start Date End Date Taking? Authorizing Provider  ARIPiprazole (ABILIFY) 30 MG tablet Take 30 mg by mouth every morning.      [provider]  aspirin EC 81 MG tablet Take 81 mg by mouth every morning.      [provider]  Biotin 10 MG CAPS Take 1 capsule by mouth daily.    [provider]  calcium carbonate (OS-CAL) 600 MG TABS Take 600 mg by mouth 2 (two) times daily with a meal.      [provider]  Cholecalciferol (VITAMIN D3) 50 MCG (2000 UT) TABS Take 1 tablet by mouth daily.    [provider]  clonazePAM (KLONOPIN) 1 MG tablet Take 1 mg by mouth 2 (two) times daily.     [provider]  divalproex (DEPAKOTE) 250 MG DR tablet Take 1,500 mg by mouth at bedtime.  Patient not taking: Reported on 04/15/2021    [provider]  docusate sodium (COLACE) 100 MG capsule Take 100 mg by mouth 3 (three) times daily.      [provider]  DULoxetine (CYMBALTA)  60 MG capsule Take 60 mg by mouth 2 (two) times daily as needed (mood).    [provider]  gabapentin (NEURONTIN) 300 MG capsule Take 1 capsule (300 mg total) by mouth 3 (three) times daily. 04/20/14   Kathlen Mody, MD  gemfibrozil (LOPID) 600 MG tablet Take 600 mg by mouth 2 (two) times daily before a meal.   Patient not taking: Reported on 04/15/2021    [provider]  glipiZIDE (GLUCOTROL) 10 MG tablet Take 10 mg by mouth 2 (two) times daily before a meal.   Patient not taking: Reported on 04/15/2021    [provider]  hydrOXYzine (ATARAX/VISTARIL) 25 MG tablet TAKE 1 TABLET THREE TIMES DAILY AS NEEDED FOR ANXIETY    [provider]  insulin aspart (NOVOLOG) 100 UNIT/ML injection CBG < 70:  implement hypoglycemia protocol CBG 70 - 120: 0 units CBG 121 - 150: 3 units CBG 151 - 200: 4 units CBG 201 - 250: 7 units CBG 251 - 300: 11 units CBG 301 - 350: 15 units CBG 351 - 400: 20 units 04/20/14   Kathlen Mody, MD  insulin glargine (LANTUS) 100 UNIT/ML injection Inject 0.6 mLs (60 Units total) into the skin daily. 04/20/14   Kathlen Mody, MD  isosorbide mononitrate (IMDUR) 30 MG 24 hr tablet Take 1 tablet (30 mg total) by mouth daily. 04/20/14   Kathlen Mody, MD  liraglutide (VICTOZA) 18 MG/3ML SOPN USE 1.8 MG SUBCUTANEOUSLY ONCE DAILY.    [provider]  lisinopril (ZESTRIL) 2.5 MG tablet Take 2.5 mg by mouth in the morning and at bedtime.    [provider]  magnesium chloride (SLOW-MAG) 64 MG TBEC SR tablet Take 1 tablet by mouth daily.    [provider]  metoprolol (TOPROL-XL) 50 MG 24 hr tablet Take 50 mg by mouth every morning.  Patient not taking: Reported on 04/15/2021    [provider]  mirabegron ER (MYRBETRIQ) 25 MG TB24 tablet Take 1 tablet by mouth 2 (two) times daily.    [provider]  Multiple Vitamins-Minerals (MULTIVITAMINS THER. W/MINERALS) TABS Take 1 tablet by mouth every morning.   Patient not taking: Reported on 04/15/2021    [provider]  oxybutynin (DITROPAN) 5 MG tablet Take 5 mg by mouth every morning.   Patient not taking: Reported on 04/15/2021    [provider]  pravastatin (PRAVACHOL) 40 MG tablet Take by mouth. Patient not taking: Reported on 04/15/2021    [provider]  predniSONE (DELTASONE) 20 MG tablet 2 tabs po daily x 3 days 04/15/21   Bethann Berkshire, MD  propranolol (INDERAL) 60 MG tablet Take 60 mg by mouth daily. On an empty stomach    [provider]  repaglinide (PRANDIN) 0.5 MG tablet Take 0.5 mg by mouth 3 (three) times daily before meals. Patient not taking: Reported on 04/15/2021    [provider]  traMADol (ULTRAM) 50 MG tablet Take 50 mg by  mouth at bedtime. For pain Patient not taking: Reported on 04/15/2021    [provider]    Allergies    Bee venom  Review of Systems   Review of Systems  Constitutional:  Negative for appetite change and fatigue.  HENT:  Negative for congestion, ear discharge and sinus pressure.   Eyes:  Negative for discharge.  Respiratory:  Positive for shortness of breath. Negative for cough.   Cardiovascular:  Negative for chest pain.  Gastrointestinal:  Negative for abdominal pain and diarrhea.  Genitourinary:  Negative for frequency and hematuria.  Musculoskeletal:  Negative for back pain.       Shoulder pain  Skin:  Negative for rash.  Neurological:  Negative for seizures and headaches.  Psychiatric/Behavioral:  Negative for hallucinations.    Physical Exam Updated Vital Signs BP 124/72   Pulse 94   Temp 98.6 F (37 C) (Oral)   Resp 19   Ht 5\' 8"  (1.727 m)   Wt 126.1 kg   SpO2 93%   BMI 42.27 kg/m   Physical Exam Vitals and nursing note reviewed.  Constitutional:      Appearance: She is well-developed.  HENT:     Head: Normocephalic.     Comments: Swelling around her left eye Eyes:     General: No scleral icterus.    Conjunctiva/sclera: Conjunctivae normal.  Neck:     Thyroid: No thyromegaly.  Cardiovascular:     Rate and Rhythm: Normal rate and regular rhythm.     Heart sounds: No murmur heard.   No friction rub. No gallop.  Pulmonary:     Breath sounds: No stridor. Wheezing present. No rales.  Chest:     Chest wall: No tenderness.  Abdominal:     General: There is no distension.     Tenderness: There is no abdominal tenderness. There is no rebound.  Musculoskeletal:     Cervical back: Neck supple.     Comments: Tender right shoulder  Lymphadenopathy:     Cervical: No cervical adenopathy.  Skin:    Findings: No erythema or rash.  Neurological:     Mental Status: She is alert and oriented to person, place, and time.     Motor: No abnormal muscle tone.      Coordination: Coordination normal.  Psychiatric:        Behavior: Behavior normal.    ED Results / Procedures / Treatments   Labs (all labs ordered are listed, but only abnormal results are displayed) Labs Reviewed  COMPREHENSIVE METABOLIC PANEL - Abnormal; Notable for the following components:      Result Value   Glucose, Bld 286 (*)    BUN 60 (*)    Creatinine, Ser 2.49 (*)    Calcium 8.5 (*)    Total Protein 6.0 (*)    Albumin 3.4 (*)    ALT 45 (*)    Alkaline Phosphatase 132 (*)    GFR, Estimated 22 (*)    All other components within normal limits  CBC WITH DIFFERENTIAL/PLATELET - Abnormal; Notable for the following components:   RBC 5.34 (*)    Hemoglobin 15.2 (*)    HCT 51.1 (*)    MCHC 29.7 (*)    RDW 15.6 (*)    nRBC 1.0 (*)    All other components within normal limits  CULTURE, BLOOD (SINGLE)  RESP PANEL BY RT-PCR (FLU A&B, COVID) ARPGX2  LACTIC ACID, PLASMA  PROTIME-INR  APTT  URINALYSIS, ROUTINE W REFLEX MICROSCOPIC    EKG None  Radiology DG Chest 1 View  Result Date: 04/22/2021 CLINICAL DATA:  Fall today. PT c/o pain in pelvis and Rt shoulder/Rt upper chest since fall. Hx COPD, nonsmoker, diabetes, HTN, obesity EXAM: CHEST  1 VIEW COMPARISON:  04/15/2021 FINDINGS: Lungs are clear. Heart size upper limits normal. Aortic Atherosclerosis (ICD10-170.0). No effusion.  No pneumothorax. Right shoulder dislocation IMPRESSION: No acute cardiopulmonary disease. Right shoulder dislocation Electronically Signed   By: 04/17/2021 M.D.   On: 04/22/2021 10:10   DG Pelvis 1-2  Views  Result Date: 04/22/2021 CLINICAL DATA:  Pain post fall EXAM: PELVIS - 1-2 VIEW COMPARISON:  CT 04/11/2014 FINDINGS: There is no evidence of pelvic fracture or diastasis. The right femoral neck is not well profiled. Chronic osteitis pubis. Degenerative changes in the lower lumbar spine. Surgical clips in the left lower quadrant. Bilateral pelvic phleboliths. No pelvic bone lesions are seen.  IMPRESSION: No acute findings. Electronically Signed   By: Corlis Leak  Hassell M.D.   On: 04/22/2021 10:12   DG Shoulder Right  Result Date: 04/22/2021 CLINICAL DATA:  Pain post fall EXAM: RIGHT SHOULDER - 2+ VIEW COMPARISON:  None. FINDINGS: Anterior right shoulder dislocation. Small cortical fragments project at the posterior margin of the glenoid. Proximal humerus appears intact. IMPRESSION: Anterior shoulder dislocation Electronically Signed   By: Corlis Leak  Hassell M.D.   On: 04/22/2021 10:13   CT Head Wo Contrast  Result Date: 04/22/2021 CLINICAL DATA:  Larey SeatFell, pain EXAM: CT HEAD WITHOUT CONTRAST TECHNIQUE: Contiguous axial images were obtained from the base of the skull through the vertex without intravenous contrast. COMPARISON:  02/07/2012 FINDINGS: Brain: Mild parenchymal atrophy. Patchy areas of hypoattenuation in deep and periventricular white matter bilaterally. Negative for acute intracranial hemorrhage, mass lesion, acute infarction, midline shift, or mass-effect. Acute infarct may be inapparent on noncontrast CT. Ventricles and sulci symmetric. Vascular: No hyperdense vessel or unexpected calcification. Skull: Normal. Negative for fracture or focal lesion. Sinuses/Orbits: No acute finding. Other: None IMPRESSION: 1. Negative for bleed or other acute intracranial process. 2. Atrophy and nonspecific white matter changes. Electronically Signed   By: Corlis Leak  Hassell M.D.   On: 04/22/2021 10:09    Procedures Procedures   Medications Ordered in ED Medications  sodium chloride 0.9 % bolus 500 mL (0 mLs Intravenous Stopped 04/22/21 1036)  methylPREDNISolone sodium succinate (SOLU-MEDROL) 125 mg/2 mL injection 125 mg (125 mg Intravenous Given 04/22/21 1142)  ipratropium-albuterol (DUONEB) 0.5-2.5 (3) MG/3ML nebulizer solution 3 mL (3 mLs Nebulization Given 04/22/21 1054)  albuterol (PROVENTIL) (2.5 MG/3ML) 0.083% nebulizer solution 2.5 mg (2.5 mg Nebulization Given 04/22/21 1054)    ED Course  I have reviewed the  triage vital signs and the nursing notes.  Pertinent labs & imaging results that were available during my care of the patient were reviewed by me and considered in my medical decision making (see chart for details).    MDM Rules/Calculators/A&P                           Patient with a dislocated right shoulder and she will be seen by orthopedics in the ED.  Patient also with an AKI and exacerbation of COPD and the hospitalist will see the patient Final Clinical Impression(s) / ED Diagnoses Final diagnoses:  Fall, initial encounter  Hypoxia  AKI (acute kidney injury) [facility](HCC)    Rx / DC Orders ED Discharge Orders     None        Bethann BerkshireZammit, Harmani Neto, MD 04/25/21 1052

## 2021-04-22 NOTE — Transfer of Care (Signed)
Immediate Anesthesia Transfer of Care Note  Patient: [name]  Procedure(s) Performed: CLOSED REDUCTION SHOULDER (Right: Shoulder)  Patient Location: PACU  Anesthesia Type:General  Level of Consciousness: awake  Airway & Oxygen Therapy: Patient Spontanous Breathing and Patient connected to face mask oxygen  Post-op Assessment: Report given to RN and Post -op Vital signs reviewed and stable  Post vital signs: Reviewed and stable  Last Vitals:  Vitals Value Taken Time  BP 129/104   Temp 99.5   Pulse 96   Resp 18   SpO2 97%     Last Pain:  Vitals:   04/22/21 1527  TempSrc: Oral  PainSc: 8          Complications: No notable events documented.

## 2021-04-22 NOTE — Op Note (Signed)
04/22/2021  4:05 PM  PATIENT:  [name]  55 y.o. female  PRE-OPERATIVE DIAGNOSIS:  right shoulder dislocation  POST-OPERATIVE DIAGNOSIS:  right shoulder dislocation  PROCEDURE:  Procedure(s): CLOSED REDUCTION SHOULDER (Right)  55-year-old female hypoxia chronic oxygen diabetes obesity fell and injured her right shoulder with a dislocation taken to the operating room for closed reduction  Once in the operating room and timeout was taken the patient was given ketamine 2 doses and the second dose relaxer and I first used the traction countertraction method with a sheet in the axilla to reduce the shoulder  We took several x-rays including fluoroscopy to confirm reduction  Patient taken to the recovery room in stable condition  SURGEON:  Surgeon(s) and Role:    * Tiena Manansala E, MD - Primary  PHYSICIAN ASSISTANT:   ASSISTANTS: none   ANESTHESIA:   IV sedation  EBL:  0 mL   BLOOD ADMINISTERED:none  DRAINS: none   LOCAL MEDICATIONS USED:  NONE  SPECIMEN:  No Specimen  DISPOSITION OF SPECIMEN:  N/A  COUNTS:  YES  TOURNIQUET:  * No tourniquets in log *  DICTATION: .Dragon Dictation  PLAN OF CARE: Admit to inpatient   PATIENT DISPOSITION:  PACU - hemodynamically stable.   Delay start of Pharmacological VTE agent (>24hrs) due to surgical blood loss or risk of bleeding: no  

## 2021-04-23 ENCOUNTER — Inpatient Hospital Stay (HOSPITAL_COMMUNITY): Payer: Medicaid Other

## 2021-04-23 DIAGNOSIS — R0603 Acute respiratory distress: Secondary | ICD-10-CM

## 2021-04-23 DIAGNOSIS — N179 Acute kidney failure, unspecified: Secondary | ICD-10-CM | POA: Diagnosis not present

## 2021-04-23 DIAGNOSIS — J9622 Acute and chronic respiratory failure with hypercapnia: Secondary | ICD-10-CM | POA: Diagnosis not present

## 2021-04-23 DIAGNOSIS — S43014A Anterior dislocation of right humerus, initial encounter: Secondary | ICD-10-CM | POA: Diagnosis not present

## 2021-04-23 DIAGNOSIS — J9621 Acute and chronic respiratory failure with hypoxia: Secondary | ICD-10-CM

## 2021-04-23 LAB — MAGNESIUM: Magnesium: 2 mg/dL (ref 1.7–2.4)

## 2021-04-23 LAB — BLOOD GAS, ARTERIAL
Acid-Base Excess: 5 mmol/L — ABNORMAL HIGH (ref 0.0–2.0)
Acid-Base Excess: 6.3 mmol/L — ABNORMAL HIGH (ref 0.0–2.0)
Bicarbonate: 26.4 mmol/L (ref 20.0–28.0)
Bicarbonate: 28 mmol/L (ref 20.0–28.0)
FIO2: 44
FIO2: 50
O2 Saturation: 91.9 %
O2 Saturation: 94.6 %
Patient temperature: 36.8
Patient temperature: 38
pCO2 arterial: 72.8 mmHg (ref 32.0–48.0)
pCO2 arterial: 72.3 mmHg (ref 32.0–48.0)
pH, Arterial: 7.26 — ABNORMAL LOW (ref 7.350–7.450)
pH, Arterial: 7.281 — ABNORMAL LOW (ref 7.350–7.450)
pO2, Arterial: 70.8 mmHg — ABNORMAL LOW (ref 83.0–108.0)
pO2, Arterial: 85 mmHg (ref 83.0–108.0)

## 2021-04-23 LAB — CBC
HCT: 53.8 % — ABNORMAL HIGH (ref 36.0–46.0)
Hemoglobin: 15.2 g/dL — ABNORMAL HIGH (ref 12.0–15.0)
MCH: 27.4 pg (ref 26.0–34.0)
MCHC: 28.3 g/dL — ABNORMAL LOW (ref 30.0–36.0)
MCV: 96.9 fL (ref 80.0–100.0)
Platelets: 231 10*3/uL (ref 150–400)
RBC: 5.55 MIL/uL — ABNORMAL HIGH (ref 3.87–5.11)
RDW: 15.8 % — ABNORMAL HIGH (ref 11.5–15.5)
WBC: 9.9 10*3/uL (ref 4.0–10.5)
nRBC: 0.6 % — ABNORMAL HIGH (ref 0.0–0.2)

## 2021-04-23 LAB — GLUCOSE, CAPILLARY
Glucose-Capillary: 125 mg/dL — ABNORMAL HIGH (ref 70–99)
Glucose-Capillary: 126 mg/dL — ABNORMAL HIGH (ref 70–99)
Glucose-Capillary: 88 mg/dL (ref 70–99)
Glucose-Capillary: 75 mg/dL (ref 70–99)

## 2021-04-23 LAB — COMPREHENSIVE METABOLIC PANEL
ALT: 40 U/L (ref 0–44)
AST: 22 U/L (ref 15–41)
Albumin: 3.4 g/dL — ABNORMAL LOW (ref 3.5–5.0)
Alkaline Phosphatase: 118 U/L (ref 38–126)
Anion gap: 8 (ref 5–15)
BUN: 42 mg/dL — ABNORMAL HIGH (ref 6–20)
CO2: 30 mmol/L (ref 22–32)
Calcium: 8.8 mg/dL — ABNORMAL LOW (ref 8.9–10.3)
Chloride: 108 mmol/L (ref 98–111)
Creatinine, Ser: 1.71 mg/dL — ABNORMAL HIGH (ref 0.44–1.00)
GFR, Estimated: 35 mL/min — ABNORMAL LOW (ref >60)
Glucose, Bld: 126 mg/dL — ABNORMAL HIGH (ref 70–99)
Potassium: 4.9 mmol/L (ref 3.5–5.1)
Sodium: 146 mmol/L — ABNORMAL HIGH (ref 135–145)
Total Bilirubin: 0.9 mg/dL (ref 0.3–1.2)
Total Protein: 6 g/dL — ABNORMAL LOW (ref 6.5–8.1)

## 2021-04-23 LAB — CREATININE, URINE, RANDOM: Creatinine, Urine: 64.35 mg/dL

## 2021-04-23 LAB — SODIUM, URINE, RANDOM: Sodium, Ur: 76 mmol/L

## 2021-04-23 LAB — MRSA NEXT GEN BY PCR, NASAL: MRSA by PCR Next Gen: NOT DETECTED

## 2021-04-23 MED ORDER — METOPROLOL TARTRATE 5 MG/5ML IV SOLN
2.5000 mg | Freq: Four times a day (QID) | INTRAVENOUS | Status: DC
  Administered 2021-04-23 – 2021-04-27 (×14): 2.5 mg via INTRAVENOUS
  Filled 2021-04-23 (×15): qty 5

## 2021-04-23 MED ORDER — PROPRANOLOL HCL 20 MG PO TABS: 20.0000 mg | ORAL_TABLET | Freq: Two times a day (BID) | ORAL | Status: DC

## 2021-04-23 MED ORDER — SODIUM CHLORIDE 0.45 % IV SOLN: INTRAVENOUS | Status: DC

## 2021-04-23 MED ORDER — CHLORHEXIDINE GLUCONATE CLOTH 2 % EX PADS
6.0000 | MEDICATED_PAD | Freq: Every day | CUTANEOUS | Status: DC
  Administered 2021-04-23 – 2021-04-29 (×7): 6 via TOPICAL

## 2021-04-23 MED ORDER — SODIUM CHLORIDE 0.9 % IV SOLN: 2.0000 g | Freq: Two times a day (BID) | INTRAVENOUS | Status: DC

## 2021-04-23 MED ORDER — PRAVASTATIN SODIUM 40 MG PO TABS
40.0000 mg | ORAL_TABLET | Freq: Every day | ORAL | Status: DC
  Administered 2021-04-24 – 2021-05-03 (×10): 40 mg via ORAL
  Filled 2021-04-23 (×10): qty 1

## 2021-04-23 MED ORDER — SODIUM CHLORIDE 0.9 % IV SOLN
2.0000 g | Freq: Two times a day (BID) | INTRAVENOUS | Status: DC
  Administered 2021-04-23 – 2021-04-25 (×4): 2 g via INTRAVENOUS
  Filled 2021-04-23 (×4): qty 2

## 2021-04-23 NOTE — Progress Notes (Signed)
Dr. Arbutus Leas made rounds on patient this AM. Dr.Tat stated to this nurse when he was rounding patient  had nasal cannula in nose but cannula was not connected to oxygen meter. he checked the patients oxygen saturation reading was 46%on room air. Dr.Tat. placed oxygen back on patient at 6 liters nasal cannula. Dr. Arbutus Leas stated to this nurse that he would put orders to move patient to step down. Primary nurse made aware. Patient now is stating 94% on 6 liters nasal cannula.

## 2021-04-23 NOTE — Progress Notes (Signed)
PROGRESS NOTE  [name] VHQ:[id number] DOB: 1966/07/09 DOA: 04/22/2021 PCP: [facility]  Brief History:   55 y.o. female with medical history of current impairment, bipolar disorder, seizure disorder, hypertension, COPD, chronic respiratory failure on 3 L, diabetes mellitus type 2, CKD stage III presenting with mechanical fall on the a.m. of 04/22/2021.  The patient is a difficult historian secondary to cognitive impairment.  History is also supplemented by the patient's spouse.  Unfortunately, he is also a difficult historian.  Nevertheless, it appears that the patient had a mechanical fall getting out of bed on the morning of 04/22/2021.  She has not had a history of frequent falls.  She last fell over 1 year prior to this admission.  There was no loss of consciousness this morning.  After the fall, the patient was complaining of pain all over.  As result, the patient was brought to emergency department for further evaluation.  Notably, the patient was noted to have a fever by EMS of 101.0 F.  She was given acetaminophen by EMS.  Prior to my assessment, the patient was noted to have an increasing oxygen requirement.  She was placed on 6 L nasal cannula.  Her spouse relates that the patient has been having more dyspnea on exertion over the past 3 to 4 weeks.  Apparently, she had a telehealth visit with her PCP a couple weeks prior to this admission.  She was given some type of medication which the spouse and patient do not recall.  There was some mild improvement in her breathing, but she complains of shortness of breath with exertion once again.  She denies any chest pain, headache, nausea, vomiting, diarrhea, abdominal pain.  04/23/21 10AM--I arrive to find patient somnolent but aroused to voice and answered yes/no to simple questions Patient stated she was thirsty but denied sob, cp. Upon checking oxygen saturation myself personally, she was 46%.  I checked the  nasal cannula in the patient's nose and followed the tubing the end of which was on the floor (not connected to the wall mounted oxygen). Then I looked at the wall mounted oxygen device which was connected to oxygen tubing.  I followed the oxygen tubing connected to the wall and it terminated (was connected) to a nebulizer mask which was sitting in a bag hanging on the wall. I connected the patient's nasal cannula tubing from her nose to the oxygen and increased it to 6L.  Over a period of 3-5 min.  Her saturation increased to 91-92%.  I then ordered STAT ABG and CXR and placed orders for transfer to SDU.   Assessment/Plan: Acute on chronic respiratory failure with hypoxia and hypercarbia -Obtain ABG -repeat CXR -Check BNP--606 -Suspect this is likely due to the patient's OSA/OHS -She has never smoked -Chronically on 3 L nasal cannula at home -Currently on 6 L -Goal is to keep saturation greater 90% -CT chest--bibasilar atelectasis, no infiltrates or effusions -Echo -d/c clonazepam -continue duonebs   Anterior right shoulder dislocation -Orthopedics, Dr. Romeo Apple consult appreciated -7/29--closed reduction of shoulder dislocation -minimize hypnotic meds -Judicious opioids/pain control   Acute on chronic renal failure--CKD 3a -Difficult to clarify whether the patient has a degree of progression of underlying CKD -Previous baseline creatinine 1.0-1.4 -Renal ultrasound--no hydronephrosis -UA   Fever -Check PCT--0.16 -Lactic acid 1.1 -Follow blood cultures -Obtain UA and urine culture   Uncontrolled diabetes mellitus type 2 with hyperglycemia -Check  A1c--8.5 -Start reduced dose Lantus -NovoLog sliding scale   Bipolar disorder -Minimize hypnotic medications -Continue Abilify, Cymbalta  Morbid Obesity -BMI 42.27 -lifestyle modification  Leg pain -venous duplex--no DVT  Essential Hypertension -restart inderal and imdur  Hyperlipidemia -restart statin          Status is: Inpatient  Remains inpatient appropriate because:Inpatient level of care appropriate due to severity of illness  Dispo: The patient is from: Home              Anticipated d/c is to: Home              Patient currently is not medically stable to d/c.   Difficult to place patient No     The patient is critically ill with multiple organ systems failure and requires high complexity decision making for assessment and support, frequent evaluation and titration of therapies, application of advanced monitoring technologies and extensive interpretation of multiple databases.  Critical care time - 35 mins.     Family Communication:   son updated  Consultants:  none  Code Status:  FULL   DVT Prophylaxis:  Salley Heparin    Procedures: As Listed in Progress Note Above  Antibiotics: Zosyn 7/30>>     Subjective: Patient is somnolent, but wakes up to voice.  ROS limited due to somnolence.  Denies cp, sob, abd pain.  Remainder ROS unobtainable.   Objective: Vitals:   04/22/21 2033 04/23/21 0057 04/23/21 0620 04/23/21 0745  BP: 114/87 126/80 118/68   Pulse: 92 96 (!) 110   Resp: Temp: (!) 100.8 F (38.2 C) 99.6 F (37.6 C) 98.2 F (36.8 C)   TempSrc: Oral Axillary    SpO2: 99% 98% 91% 90%  Weight:      Height:        Intake/Output Summary (Last 24 hours) at 04/23/2021 1000 Last data filed at 04/23/2021 0300 Gross per 24 hour  Intake 1652.5 ml  Output 800 ml  Net 852.5 ml   Weight change:  Exam:  General:  Pt is alert, follows commands appropriately, not in acute distress HEENT: No icterus, No thrush, No neck mass, Williams/AT Cardiovascular: RRR, S1/S2, no rubs, no gallops Respiratory: bibasilar rales.  No wheeze Abdomen: Soft/+BS, non tender, non distended, no guarding Extremities: Non pitting edema, No lymphangitis, No petechiae, No rashes, no synovitis   Data Reviewed: I have personally reviewed following labs and imaging studies Basic  Metabolic Panel: Recent Labs  Lab 04/22/21 0855 04/23/21 0430  NA 138 146*  K 4.8 4.9  CL 105 108  CO2 26 30  GLUCOSE 286* 126*  BUN 60* 42*  CREATININE 2.49* 1.71*  CALCIUM 8.5* 8.8*  MG  --  2.0   Liver Function Tests: Recent Labs  Lab 04/22/21 0855 04/23/21 0430  AST 19 22  ALT 45* 40  ALKPHOS 132* 118  BILITOT 0.9 0.9  PROT 6.0* 6.0*  ALBUMIN 3.4* 3.4*   No results for input(s): LIPASE, AMYLASE in the last 168 hours. Recent Labs  Lab 04/22/21 1835  AMMONIA 28   Coagulation Profile: Recent Labs  Lab 04/22/21 0855  INR 1.1   CBC: Recent Labs  Lab 04/22/21 0855 04/23/21 0430  WBC 8.1 9.9  NEUTROABS 6.1  --   HGB 15.2* 15.2*  HCT 51.1* 53.8*  MCV 95.7 96.9  PLT 214 231   Cardiac Enzymes: No results for input(s): CKTOTAL, CKMB, CKMBINDEX, TROPONINI in the last 168 hours. BNP: Invalid input(s): POCBNP CBG: Recent  Labs  Lab 04/22/21 1520 04/22/21 1607 04/22/21 2027 04/23/21 0724  GLUCAP 155* 136* 110* 125*   HbA1C: Recent Labs    04/22/21 0855  HGBA1C 9.5*   Urine analysis:    Component Value Date/Time   COLORURINE YELLOW (A) 05/01/2017 1852   APPEARANCEUR CLEAR (A) 05/01/2017 1852   LABSPEC 1.023 05/01/2017 1852   PHURINE 5.0 05/01/2017 1852   GLUCOSEU >=500 (A) 05/01/2017 1852   HGBUR NEGATIVE 05/01/2017 1852   BILIRUBINUR NEGATIVE 05/01/2017 1852   KETONESUR NEGATIVE 05/01/2017 1852   PROTEINUR 100 (A) 05/01/2017 1852   UROBILINOGEN 0.2 04/11/2014 1927   NITRITE NEGATIVE 05/01/2017 1852   LEUKOCYTESUR NEGATIVE 05/01/2017 1852   Sepsis Labs: @LABRCNTIP (procalcitonin:4,lacticidven:4) ) Recent Results (from the past 240 hour(s))  Resp Panel by RT-PCR (Flu A&B, Covid) Nasopharyngeal Swab     Status: None   Collection Time: 04/15/21  3:19 PM   Specimen: Nasopharyngeal Swab; Nasopharyngeal(NP) swabs in vial transport medium  Result Value Ref Range Status   SARS Coronavirus 2 by RT PCR NEGATIVE NEGATIVE Final    Comment:  (NOTE) SARS-CoV-2 target nucleic acids are NOT DETECTED.  The SARS-CoV-2 RNA is generally detectable in upper respiratory specimens during the acute phase of infection. The lowest concentration of SARS-CoV-2 viral copies this assay can detect is 138 copies/mL. A negative result does not preclude SARS-Cov-2 infection and should not be used as the sole basis for treatment or other patient management decisions. A negative result may occur with  improper specimen collection/handling, submission of specimen other than nasopharyngeal swab, presence of viral mutation(s) within the areas targeted by this assay, and inadequate number of viral copies(<138 copies/mL). A negative result must be combined with clinical observations, patient history, and epidemiological information. The expected result is Negative.  Fact Sheet for Patients:  BloggerCourse.comhttps://www.fda.gov/media/152166/download  Fact Sheet for Healthcare Providers:  SeriousBroker.ithttps://www.fda.gov/media/152162/download  This test is no t yet approved or cleared by the [place] FDA and  has been authorized for detection and/or diagnosis of SARS-CoV-2 by FDA under an Emergency Use Authorization (EUA). This EUA will remain  in effect (meaning this test can be used) for the duration of the COVID-19 declaration under Section 564(b)(1) of the Act, 21 U.S.C.section 360bbb-3(b)(1), unless the authorization is terminated  or revoked sooner.       Influenza A by PCR NEGATIVE NEGATIVE Final   Influenza B by PCR NEGATIVE NEGATIVE Final    Comment: (NOTE) The Xpert Xpress SARS-CoV-2/FLU/RSV plus assay is intended as an aid in the diagnosis of influenza from Nasopharyngeal swab specimens and should not be used as a sole basis for treatment. Nasal washings and aspirates are unacceptable for Xpert Xpress SARS-CoV-2/FLU/RSV testing.  Fact Sheet for Patients: BloggerCourse.comhttps://www.fda.gov/media/152166/download  Fact Sheet for Healthcare  Providers: SeriousBroker.ithttps://www.fda.gov/media/152162/download  This test is not yet approved or cleared by the [place] FDA and has been authorized for detection and/or diagnosis of SARS-CoV-2 by FDA under an Emergency Use Authorization (EUA). This EUA will remain in effect (meaning this test can be used) for the duration of the COVID-19 declaration under Section 564(b)(1) of the Act, 21 U.S.C. section 360bbb-3(b)(1), unless the authorization is terminated or revoked.  Performed at [facility], [address] [place], [place] [address]   Blood culture (routine single)     Status: None (Preliminary result)   Collection Time: 04/22/21  8:55 AM   Specimen: BLOOD  Result Value Ref Range Status   Specimen Description BLOOD RIGHT HAND  Final   Special Requests  Final    BOTTLES DRAWN AEROBIC AND ANAEROBIC Blood Culture results may not be optimal due to an inadequate volume of blood received in culture bottles   Culture   Final    NO GROWTH < 24 HOURS Performed at [facility], [address] [place], [place] [address]    Report Status PENDING  Incomplete  Resp Panel by RT-PCR (Flu A&B, Covid) Nasopharyngeal Swab     Status: None   Collection Time: 04/22/21  9:27 AM   Specimen: Nasopharyngeal Swab; Nasopharyngeal(NP) swabs in vial transport medium  Result Value Ref Range Status   SARS Coronavirus 2 by RT PCR NEGATIVE NEGATIVE Final    Comment: (NOTE) SARS-CoV-2 target nucleic acids are NOT DETECTED.  The SARS-CoV-2 RNA is generally detectable in upper respiratory specimens during the acute phase of infection. The lowest concentration of SARS-CoV-2 viral copies this assay can detect is 138 copies/mL. A negative result does not preclude SARS-Cov-2 infection and should not be used as the sole basis for treatment or other patient management decisions. A negative result may occur with  improper specimen collection/handling, submission of specimen other than nasopharyngeal swab, presence of  viral mutation(s) within the areas targeted by this assay, and inadequate number of viral copies(<138 copies/mL). A negative result must be combined with clinical observations, patient history, and epidemiological information. The expected result is Negative.  Fact Sheet for Patients:  BloggerCourse.com  Fact Sheet for Healthcare Providers:  SeriousBroker.it  This test is no t yet approved or cleared by the [place] FDA and  has been authorized for detection and/or diagnosis of SARS-CoV-2 by FDA under an Emergency Use Authorization (EUA). This EUA will remain  in effect (meaning this test can be used) for the duration of the COVID-19 declaration under Section 564(b)(1) of the Act, 21 U.S.C.section 360bbb-3(b)(1), unless the authorization is terminated  or revoked sooner.       Influenza A by PCR NEGATIVE NEGATIVE Final   Influenza B by PCR NEGATIVE NEGATIVE Final    Comment: (NOTE) The Xpert Xpress SARS-CoV-2/FLU/RSV plus assay is intended as an aid in the diagnosis of influenza from Nasopharyngeal swab specimens and should not be used as a sole basis for treatment. Nasal washings and aspirates are unacceptable for Xpert Xpress SARS-CoV-2/FLU/RSV testing.  Fact Sheet for Patients: BloggerCourse.com  Fact Sheet for Healthcare Providers: SeriousBroker.it  This test is not yet approved or cleared by the [place] FDA and has been authorized for detection and/or diagnosis of SARS-CoV-2 by FDA under an Emergency Use Authorization (EUA). This EUA will remain in effect (meaning this test can be used) for the duration of the COVID-19 declaration under Section 564(b)(1) of the Act, 21 U.S.C. section 360bbb-3(b)(1), unless the authorization is terminated or revoked.  Performed at [facility], [address], [place], [place] [address]      Scheduled Meds:  ARIPiprazole   30 mg Oral Daily   aspirin EC  81 mg Oral q morning   clotrimazole  10 mg Oral 5 X Daily   docusate sodium  100 mg Oral TID   heparin  5,000 Units Subcutaneous Q8H   insulin aspart  0-15 Units Subcutaneous TID WC   insulin aspart  0-5 Units Subcutaneous QHS   insulin glargine-yfgn  30 Units Subcutaneous QHS   ipratropium-albuterol  3 mL Nebulization TID   isosorbide mononitrate  30 mg Oral Daily   mirabegron ER  25 mg Oral BID   nystatin   Topical BID   Continuous Infusions:  sodium chloride 75 mL/hr at 04/22/21 1658    Procedures/Studies: DG Chest 1 View  Result Date: 04/22/2021 CLINICAL DATA:  Fall today. PT c/o pain in pelvis and Rt shoulder/Rt upper chest since fall. Hx COPD, nonsmoker, diabetes, HTN, obesity EXAM: CHEST  1 VIEW COMPARISON:  04/15/2021 FINDINGS: Lungs are clear. Heart size upper limits normal. Aortic Atherosclerosis (ICD10-170.0). No effusion.  No pneumothorax. Right shoulder dislocation IMPRESSION: No acute cardiopulmonary disease. Right shoulder dislocation Electronically Signed   By: Corlis Leak M.D.   On: 04/22/2021 10:10   DG Pelvis 1-2 Views  Result Date: 04/22/2021 CLINICAL DATA:  Pain post fall EXAM: PELVIS - 1-2 VIEW COMPARISON:  CT 04/11/2014 FINDINGS: There is no evidence of pelvic fracture or diastasis. The right femoral neck is not well profiled. Chronic osteitis pubis. Degenerative changes in the lower lumbar spine. Surgical clips in the left lower quadrant. Bilateral pelvic phleboliths. No pelvic bone lesions are seen. IMPRESSION: No acute findings. Electronically Signed   By: Corlis Leak M.D.   On: 04/22/2021 10:12   DG Shoulder Right  Result Date: 04/22/2021 CLINICAL DATA:  Right shoulder dislocation. EXAM: DG C-ARM 1-60 MIN; RIGHT SHOULDER - 2+ VIEW FLUOROSCOPY TIME:  Fluoroscopy Time:  48 seconds. Number of Acquired Spot Images: 3 COMPARISON:  04/22/2021. FINDINGS: Three C-arm fluoroscopic images were obtained intraoperatively and submitted for post  operative interpretation. These images demonstrate closed reduction of the right shoulder. The shoulder appears to be located. Please see the performing provider's procedural report for further detail. IMPRESSION: Intraoperative fluoroscopy during shoulder reduction. The right shoulder appears to be located. Post reduction radiographs could confirm and better evaluate for fracture. Electronically Signed   By: Feliberto Harts MD   On: 04/22/2021 16:25   DG Shoulder Right  Result Date: 04/22/2021 CLINICAL DATA:  Pain post fall EXAM: RIGHT SHOULDER - 2+ VIEW COMPARISON:  None. FINDINGS: Anterior right shoulder dislocation. Small cortical fragments project at the posterior margin of the glenoid. Proximal humerus appears intact. IMPRESSION: Anterior shoulder dislocation Electronically Signed   By: Corlis Leak M.D.   On: 04/22/2021 10:13   CT Head Wo Contrast  Result Date: 04/22/2021 CLINICAL DATA:  Larey Seat, pain EXAM: CT HEAD WITHOUT CONTRAST TECHNIQUE: Contiguous axial images were obtained from the base of the skull through the vertex without intravenous contrast. COMPARISON:  02/07/2012 FINDINGS: Brain: Mild parenchymal atrophy. Patchy areas of hypoattenuation in deep and periventricular white matter bilaterally. Negative for acute intracranial hemorrhage, mass lesion, acute infarction, midline shift, or mass-effect. Acute infarct may be inapparent on noncontrast CT. Ventricles and sulci symmetric. Vascular: No hyperdense vessel or unexpected calcification. Skull: Normal. Negative for fracture or focal lesion. Sinuses/Orbits: No acute finding. Other: None IMPRESSION: 1. Negative for bleed or other acute intracranial process. 2. Atrophy and nonspecific white matter changes. Electronically Signed   By: Corlis Leak M.D.   On: 04/22/2021 10:09   CT CHEST WO CONTRAST  Result Date: 04/22/2021 CLINICAL DATA:  Dyspnea, chronic, unclear etiology Respiratory illness, nondiagnostic xray EXAM: CT CHEST WITHOUT CONTRAST  TECHNIQUE: Multidetector CT imaging of the chest was performed following the standard protocol without IV contrast. COMPARISON:  CT chest abdomen pelvis 04/11/2014 FINDINGS: Cardiovascular: Normal heart size. Trace pericardial effusion. The thoracic aorta is normal in caliber. At least mild atherosclerotic plaque of the thoracic aorta. At least 2 vessel coronary artery calcifications. Question enlarged main pulmonary artery. Mediastinum/Nodes: No gross hilar adenopathy, noting limited sensitivity for the detection of hilar adenopathy on this noncontrast study. No enlarged  mediastinal or axillary lymph nodes. Thyroid gland, trachea, and esophagus demonstrate no significant findings. Lungs/Pleura: Bilateral dependent posterior subsegmental atelectasis. Bilateral lower lobe base linear atelectasis versus scarring. No focal consolidation. Few scattered calcified and noncalcified pulmonary micronodules. No pulmonary mass. No pleural effusion. No pneumothorax. Upper Abdomen: There is a 1.6 cm fluid density lesion at the superior pole of the left kidney likely represents a simple renal cyst. Otherwise no acute abnormality. Musculoskeletal: No chest wall abnormality. No suspicious lytic or blastic osseous lesions. No acute displaced fracture. Multilevel degenerative changes of the spine. Old posterior left third rib fracture. IMPRESSION: 1. No acute intrathoracic abnormality with limited evaluation on this noncontrast study. 2. Question enlarged main pulmonary artery suggestive of pulmonary hypertension. 3. Aortic Atherosclerosis (ICD10-I70.0) including coronary calcifications. Electronically Signed   By: Tish Frederickson M.D.   On: 04/22/2021 20:52   US RENAL  Result Date: 04/22/2021 CLINICAL DATA:  Dyspnea.  Chronic stage III A renal disease. EXAM: RENAL / URINARY TRACT ULTRASOUND COMPLETE COMPARISON:  None. FINDINGS: Right Kidney: Renal measurements: 13.2 x 6.5 x 6.5 cm = volume: 292 mL. Echogenicity within normal  limits. No mass or hydronephrosis visualized. Left Kidney: Limited evaluation. Renal measurements: 14.7 x 6.8 x 8.1 cm= volume: 423 mL. Echogenicity within normal limits. There is a 3.7 x 3.2 x 3.7 cm cystic lesion within the left kidney. No solid mass or hydronephrosis visualized. Urinary bladder: Appears normal for degree of bladder distention. Other: None. IMPRESSION: Grossly unremarkable renal ultrasound with a 3.7 cm left simple renal cyst. Please note otherwise limited evaluation of the left kidney. Electronically Signed   By: Tish Frederickson M.D.   On: 04/22/2021 20:05   US Venous Img Lower Bilateral (DVT)  Result Date: 04/23/2021 CLINICAL DATA:  Chronic lower extremity pain and edema EXAM: BILATERAL LOWER EXTREMITY VENOUS DOPPLER ULTRASOUND TECHNIQUE: Gray-scale sonography with graded compression, as well as color Doppler and duplex ultrasound were performed to evaluate the lower extremity deep venous systems from the level of the common femoral vein and including the common femoral, femoral, profunda femoral, popliteal and calf veins including the posterior tibial, peroneal and gastrocnemius veins when visible. The superficial great saphenous vein was also interrogated. Spectral Doppler was utilized to evaluate flow at rest and with distal augmentation maneuvers in the common femoral, femoral and popliteal veins. COMPARISON:  None. FINDINGS: RIGHT LOWER EXTREMITY Common Femoral Vein: No evidence of thrombus. Normal compressibility, respiratory phasicity and response to augmentation. Saphenofemoral Junction: No evidence of thrombus. Normal compressibility and flow on color Doppler imaging. Profunda Femoral Vein: No evidence of thrombus. Normal compressibility and flow on color Doppler imaging. Femoral Vein: No evidence of thrombus. Normal compressibility, respiratory phasicity and response to augmentation. Popliteal Vein: No evidence of thrombus. Normal compressibility, respiratory phasicity and response  to augmentation. Calf Veins: Limited visualization because of body habitus. Tibial peroneal veins appear grossly patent and compressible. LEFT LOWER EXTREMITY Common Femoral Vein: No evidence of thrombus. Normal compressibility, respiratory phasicity and response to augmentation. Saphenofemoral Junction: No evidence of thrombus. Normal compressibility and flow on color Doppler imaging. Profunda Femoral Vein: No evidence of thrombus. Normal compressibility and flow on color Doppler imaging. Femoral Vein: No evidence of thrombus. Normal compressibility, respiratory phasicity and response to augmentation. Popliteal Vein: No evidence of thrombus. Normal compressibility, respiratory phasicity and response to augmentation. Calf Veins: Again limited because of body habitus. Posterior tibial vein appears patent. Peroneal vein not visualized. IMPRESSION: No significant femoropopliteal DVT in either extremity. Limited assessment of the calf  veins because of body habitus. Electronically Signed   By: Judie Petit.  Shick M.D.   On: 04/23/2021 08:02   DG Chest Port 1 View  Result Date: 04/15/2021 CLINICAL DATA:  sob EXAM: PORTABLE CHEST 1 VIEW COMPARISON:  April 16, 2014 FINDINGS: The cardiomediastinal silhouette is unchanged and enlarged in contour.Atherosclerotic calcifications. No pleural effusion. No pneumothorax. Perihilar vascular prominence without overt edema. Visualized abdomen is unremarkable. No acute osseous abnormality. IMPRESSION: No acute cardiopulmonary abnormality. Electronically Signed   By: Meda Klinefelter MD   On: 04/15/2021 15:55   DG C-Arm 1-60 Min  Result Date: 04/22/2021 CLINICAL DATA:  Right shoulder dislocation. EXAM: DG C-ARM 1-60 MIN; RIGHT SHOULDER - 2+ VIEW FLUOROSCOPY TIME:  Fluoroscopy Time:  48 seconds. Number of Acquired Spot Images: 3 COMPARISON:  04/22/2021. FINDINGS: Three C-arm fluoroscopic images were obtained intraoperatively and submitted for post operative interpretation. These images  demonstrate closed reduction of the right shoulder. The shoulder appears to be located. Please see the performing provider's procedural report for further detail. IMPRESSION: Intraoperative fluoroscopy during shoulder reduction. The right shoulder appears to be located. Post reduction radiographs could confirm and better evaluate for fracture. Electronically Signed   By: Feliberto Harts MD   On: 04/22/2021 16:25    Catarina Hartshorn, DO  [facility]  If 7PM-7AM, please contact night-coverage [email or web address] Password [email or web address] 04/23/2021, 10:00 AM   LOS: 1 day

## 2021-04-23 NOTE — Progress Notes (Signed)
Patient urinated with purewick but still complained of urge to urinate.   Bladder scan showed >800 mL in bladder. In and out cath performed per order. Dr. Arbutus Leas made aware of obtained from in and out cath.  Order to place foley catheter.   Urine culture, urinalysis and other urine labs obtained.

## 2021-04-23 NOTE — Progress Notes (Addendum)
Off BPAP for now awake , alert. On 6 liters , normal oxygen is 3 liters at home. Has kidney failure so bicarb most likely affected. Ph acid , PCO2 high chronic. Does not wear BiPAP for sleep.

## 2021-04-23 NOTE — Progress Notes (Signed)
Received call from Lab on this patient with a critical value on her PaCO2 of 72.8.  Sent a chat message to Dr. Arbutus Leas with this value, awaiting his instructions.

## 2021-04-23 NOTE — Progress Notes (Signed)
Placed back on BiPAP for HS, patient not having problems on 6 liters.

## 2021-04-23 NOTE — Progress Notes (Signed)
  Echocardiogram 2D Echocardiogram has been performed.  [name] Wonders 04/23/2021, 2:35 PM

## 2021-04-23 NOTE — Progress Notes (Signed)
Patient ID: [name], female   DOB: 11/17/1965, 55 y.o.   MRN: [id number]  Postop day 1 status post closed reduction right shoulder  This injury usually has a course of good stability postoperatively as long as a sling is maintained.  Recommend 2-week follow-up after discharge  Keep sling in place address any pain with appropriate medication with minimal opioid use as possible.

## 2021-04-24 ENCOUNTER — Inpatient Hospital Stay (HOSPITAL_COMMUNITY): Payer: Medicaid Other

## 2021-04-24 DIAGNOSIS — N179 Acute kidney failure, unspecified: Secondary | ICD-10-CM | POA: Diagnosis not present

## 2021-04-24 DIAGNOSIS — S43014S Anterior dislocation of right humerus, sequela: Secondary | ICD-10-CM | POA: Diagnosis not present

## 2021-04-24 DIAGNOSIS — J9622 Acute and chronic respiratory failure with hypercapnia: Secondary | ICD-10-CM | POA: Diagnosis not present

## 2021-04-24 DIAGNOSIS — J9621 Acute and chronic respiratory failure with hypoxia: Secondary | ICD-10-CM | POA: Diagnosis not present

## 2021-04-24 LAB — GLUCOSE, CAPILLARY
Glucose-Capillary: 85 mg/dL (ref 70–99)
Glucose-Capillary: 104 mg/dL — ABNORMAL HIGH (ref 70–99)
Glucose-Capillary: 162 mg/dL — ABNORMAL HIGH (ref 70–99)
Glucose-Capillary: 269 mg/dL — ABNORMAL HIGH (ref 70–99)

## 2021-04-24 LAB — CBC
HCT: 48 % — ABNORMAL HIGH (ref 36.0–46.0)
Hemoglobin: 14.3 g/dL (ref 12.0–15.0)
MCH: 28.4 pg (ref 26.0–34.0)
MCHC: 29.8 g/dL — ABNORMAL LOW (ref 30.0–36.0)
MCV: 95.2 fL (ref 80.0–100.0)
Platelets: 195 10*3/uL (ref 150–400)
RBC: 5.04 MIL/uL (ref 3.87–5.11)
RDW: 15.5 % (ref 11.5–15.5)
WBC: 6.6 10*3/uL (ref 4.0–10.5)
nRBC: 0 % (ref 0.0–0.2)

## 2021-04-24 LAB — ECHOCARDIOGRAM COMPLETE
Area-P 1/2: 2.29 cm2
Height: 68 in
MV VTI: 3.14 cm2
S' Lateral: 2.71 cm
Weight: 5548.54 oz

## 2021-04-24 LAB — BASIC METABOLIC PANEL
Anion gap: 8 (ref 5–15)
BUN: 28 mg/dL — ABNORMAL HIGH (ref 6–20)
CO2: 31 mmol/L (ref 22–32)
Calcium: 8.8 mg/dL — ABNORMAL LOW (ref 8.9–10.3)
Chloride: 106 mmol/L (ref 98–111)
Creatinine, Ser: 1.35 mg/dL — ABNORMAL HIGH (ref 0.44–1.00)
GFR, Estimated: 46 mL/min — ABNORMAL LOW (ref >60)
Glucose, Bld: 73 mg/dL (ref 70–99)
Potassium: 4.5 mmol/L (ref 3.5–5.1)
Sodium: 145 mmol/L (ref 135–145)

## 2021-04-24 LAB — PROCALCITONIN: Procalcitonin: <0.1 ng/mL

## 2021-04-24 LAB — BRAIN NATRIURETIC PEPTIDE: B Natriuretic Peptide: 69 pg/mL (ref 0.0–100.0)

## 2021-04-24 MED ORDER — VITAMIN B-12 100 MCG PO TABS
500.0000 ug | ORAL_TABLET | Freq: Every day | ORAL | Status: DC
  Administered 2021-04-26 – 2021-05-04 (×9): 500 ug via ORAL
  Filled 2021-04-24 (×4): qty 5
  Filled 2021-04-24 (×4): qty 1
  Filled 2021-04-24: qty 5

## 2021-04-24 MED ORDER — IOHEXOL 9 MG/ML PO SOLN
500.0000 mL | ORAL | Status: AC
Start: 2021-04-24 — End: 2021-04-24
  Administered 2021-04-24 (×2): 500 mL via ORAL

## 2021-04-24 MED ORDER — CYANOCOBALAMIN 1000 MCG/ML IJ SOLN
1000.0000 ug | Freq: Once | INTRAMUSCULAR | Status: AC
  Administered 2021-04-24: 1000 ug via INTRAMUSCULAR
  Filled 2021-04-24: qty 1

## 2021-04-24 NOTE — Progress Notes (Signed)
Was called to patient's room by RN.  RN stated that patient was desating and needed to be put back on Bipap.  Placed patient back on Bipap but had to take patient up to 100% to get her back up from mid to upper 80s.  Patient is back at 40% now with a sat of 97%.  Will report this to oncoming RT.

## 2021-04-24 NOTE — Progress Notes (Signed)
Patient ID: [name], female   DOB: 1965-10-02, 55 y.o.   MRN: [id number] Dr Tat called to alert me that the right shoulder has re-dislocated; the nurse removed the sling to bathe the patient.  She has already eaten so I ve decided to NOT try to put her under anesthesia at this time of day with current staffing levels.  She has been scheduled for surgery Monday

## 2021-04-24 NOTE — Progress Notes (Signed)
Helped patient with her meal.  Patient ate well, notified RN.  Patient currently still on 6L [place] with sat around 94%.  Will continue to monitor.  Bipap at bedside if needed.

## 2021-04-24 NOTE — Progress Notes (Addendum)
Dr. Romeo Apple called to inform this RN that patient's shoulder had re-dislocated and patient would have to have surgery again. This RN let Dr. Romeo Apple know that patient had a bath today by this RN and NT and sling was removed for duration of bath due to sling being soiled. Sling was replaced after bath. Dr. Romeo Apple informed this RN that sling was not to be removed per order. No ortho order noted for sling. But then informed it should have been passed off in report when patient was received from the 3rd floor. Unfortunately, yesterday when patient came from the 3rd floor, she was in acute respiratory distress and needed bipap. Most of the report from the 3rd floor nurse was about the oxygenation issues. This RN also informed that if unsure about sling, that a question should have been asked. This RN agreed and understood.

## 2021-04-24 NOTE — Progress Notes (Signed)
PROGRESS NOTE  CORONA POPOVICH TGG:[id number] DOB: [birth year] DOA: 04/22/2021 PCP: [facility]  Brief History:   55 y.o. female with medical history of current impairment, bipolar disorder, seizure disorder, hypertension, COPD, chronic respiratory failure on 3 L, diabetes mellitus type 2, CKD stage III presenting with mechanical fall on the a.m. of 04/22/2021.  The patient is a difficult historian secondary to cognitive impairment.  History is also supplemented by the patient's spouse.  Unfortunately, he is also a difficult historian.  Nevertheless, it appears that the patient had a mechanical fall getting out of bed on the morning of 04/22/2021.  She has not had a history of frequent falls.  She last fell over 1 year prior to this admission.  There was no loss of consciousness this morning.  After the fall, the patient was complaining of pain all over.  As result, the patient was brought to emergency department for further evaluation.  Notably, the patient was noted to have a fever by EMS of 101.0 F.  She was given acetaminophen by EMS.  Prior to my assessment, the patient was noted to have an increasing oxygen requirement.  She was placed on 6 L nasal cannula.  Her spouse relates that the patient has been having more dyspnea on exertion over the past 3 to 4 weeks.  Apparently, she had a telehealth visit with her PCP a couple weeks prior to this admission.  She was given some type of medication which the spouse and patient do not recall.  There was some mild improvement in her breathing, but she complains of shortness of breath with exertion once again.  She denies any chest pain, headache, nausea, vomiting, diarrhea, abdominal pain.   04/23/21 10AM--I arrive to find patient somnolent but aroused to voice and answered yes/no to simple questions Patient stated she was thirsty but denied sob, cp. Upon checking oxygen saturation myself personally, she was 46%.  I checked  the nasal cannula in the patient's nose and followed the tubing the end of which was on the floor (not connected to the wall mounted oxygen). Then I looked at the wall mounted oxygen device which was connected to oxygen tubing.  I followed the oxygen tubing connected to the wall and it terminated (was connected) to a nebulizer mask which was sitting in a bag hanging on the wall. I connected the patient's nasal cannula tubing from her nose to the oxygen and increased it to 6L.  Over a period of 3-5 min.  Her saturation increased to 91-92%.  I then ordered STAT ABG and CXR and placed orders for transfer to SDU.     Assessment/Plan: Acute on chronic respiratory failure with hypoxia and hypercarbia -Obtain ABG 7.281/72/85/28 on 0.5 -Check BNP--606 -Suspect this is likely due to the patient's OSA/OHS -She has never smoked -Chronically on 3 L nasal cannula at home -04/23/21--placed on bipap due to somnolence and hypercarbia -Currently on 6 L -Goal is to keep saturation greater 90% -CT chest--bibasilar atelectasis, no infiltrates or effusions -Echo EF 70-75%, no WMA; mild decrease RV function, mild AS -d/c clonazepam -continue duonebs -wean oxygen for saturation >90%   Anterior right shoulder dislocation -Orthopedics, Dr. Romeo Apple consult appreciated -7/29--closed reduction of shoulder dislocation -minimize hypnotic meds -Judicious opioids/pain control   Acute on chronic renal failure--CKD 3a -Difficult to clarify whether the patient has a degree of progression of underlying CKD -Previous baseline creatinine 1.0-1.4 -Renal ultrasound--no hydronephrosis  Fever -Check PCT--0.16 -Lactic acid 1.1 -continues to have fever up to 100.6 -Follow blood cultures--neg to date -Obtain UA and urine culture--follow -7/29 CT chest--no consolidation -obtain CT abd/pelvis   Uncontrolled diabetes mellitus type 2 with hyperglycemia -Check A1c--8.5 -Continue reduced dose Lantus -NovoLog sliding  scale   Bipolar disorder -Minimize hypnotic medications -Continue Abilify, Cymbalta   Morbid Obesity -BMI 42.27 -lifestyle modification   Leg pain -venous duplex--no DVT   Essential Hypertension -restart inderal and imdur -on IV metoprolol while inderal was held   Hyperlipidemia -restart statin  Low B12 -supplement po -give IM injection x 1             Status is: Inpatient   Remains inpatient appropriate because:Inpatient level of care appropriate due to severity of illness   Dispo: The patient is from: Home              Anticipated d/c is to: Home              Patient currently is not medically stable to d/c.              Difficult to place patient No                Family Communication:   son updated 7/31   Consultants:  none   Code Status:  FULL   DVT Prophylaxis:  [place] Heparin     Procedures: As Listed in Progress Note Above   Antibiotics: Cefepime 7/30>>    Subjective: Patient complains of right shoulder pain and abd pain.  Denies f/c cp, sob, n/v/d  Objective: Vitals:   04/24/21 1131 04/24/21 1200 04/24/21 1300 04/24/21 1400  BP:  139/87 (!) 155/78 133/77  Pulse: (!) 104 (!) 111 (!) 104 (!) 108  Resp: 17 19 (!) 21 14  Temp: 100.2 F (37.9 C)     TempSrc: Oral     SpO2: 95% 96% 94% 94%  Weight:      Height:        Intake/Output Summary (Last 24 hours) at 04/24/2021 1416 Last data filed at 04/24/2021 1200 Gross per 24 hour  Intake 996.45 ml  Output 2750 ml  Net -1753.55 ml   Weight change: 31.2 kg Exam:  General:  Pt is alert, follows commands appropriately, not in acute distress HEENT: No icterus, No thrush, No neck mass, [place]/AT Cardiovascular: RRR, S1/S2, no rubs, no gallops Respiratory: bibasilar rales. No wheeze Abdomen: Soft/+BS, non tender, non distended, no guarding Extremities: Non pitting edema, No lymphangitis, No petechiae, No rashes, no synovitis   Data Reviewed: I have personally reviewed following labs and  imaging studies Basic Metabolic Panel: Recent Labs  Lab 04/22/21 0855 04/23/21 0430 04/24/21 0444  NA 138 146* 145  K 4.8 4.9 4.5  CL 105 108 106  CO2 GLUCOSE 286* 126* 73  BUN 60* 42* 28*  CREATININE 2.49* 1.71* 1.35*  CALCIUM 8.5* 8.8* 8.8*  MG  --  2.0  --    Liver Function Tests: Recent Labs  Lab 04/22/21 0855 04/23/21 0430  AST 19 22  ALT 45* 40  ALKPHOS 132* 118  BILITOT 0.9 0.9  PROT 6.0* 6.0*  ALBUMIN 3.4* 3.4*   No results for input(s): LIPASE, AMYLASE in the last 168 hours. Recent Labs  Lab 04/22/21 1835  AMMONIA 28   Coagulation Profile: Recent Labs  Lab 04/22/21 0855  INR 1.1   CBC: Recent Labs  Lab 04/22/21 0855 04/23/21 0430 04/24/21 0444  WBC 8.1 9.9 6.6  NEUTROABS 6.1  --   --   HGB 15.2* 15.2* 14.3  HCT 51.1* 53.8* 48.0*  MCV 95.7 96.9 95.2  PLT 214 231 195   Cardiac Enzymes: No results for input(s): CKTOTAL, CKMB, CKMBINDEX, TROPONINI in the last 168 hours. BNP: Invalid input(s): POCBNP CBG: Recent Labs  Lab 04/23/21 1208 04/23/21 1622 04/23/21 2033 04/24/21 0742 04/24/21 1130  GLUCAP 126* 88 75 85 104*   HbA1C: Recent Labs    04/22/21 0855  HGBA1C 9.5*   Urine analysis:    Component Value Date/Time   COLORURINE YELLOW (A) 05/01/2017 1852   APPEARANCEUR CLEAR (A) 05/01/2017 1852   LABSPEC 1.023 05/01/2017 1852   PHURINE 5.0 05/01/2017 1852   GLUCOSEU >=500 (A) 05/01/2017 1852   HGBUR NEGATIVE 05/01/2017 1852   BILIRUBINUR NEGATIVE 05/01/2017 1852   KETONESUR NEGATIVE 05/01/2017 1852   PROTEINUR 100 (A) 05/01/2017 1852   UROBILINOGEN 0.2 04/11/2014 1927   NITRITE NEGATIVE 05/01/2017 1852   LEUKOCYTESUR NEGATIVE 05/01/2017 1852   Sepsis Labs: @LABRCNTIP (procalcitonin:4,lacticidven:4) ) Recent Results (from the past 240 hour(s))  Resp Panel by RT-PCR (Flu A&B, Covid) Nasopharyngeal Swab     Status: None   Collection Time: 04/15/21  3:19 PM   Specimen: Nasopharyngeal Swab; Nasopharyngeal(NP) swabs  in vial transport medium  Result Value Ref Range Status   SARS Coronavirus 2 by RT PCR NEGATIVE NEGATIVE Final    Comment: (NOTE) SARS-CoV-2 target nucleic acids are NOT DETECTED.  The SARS-CoV-2 RNA is generally detectable in upper respiratory specimens during the acute phase of infection. The lowest concentration of SARS-CoV-2 viral copies this assay can detect is 138 copies/mL. A negative result does not preclude SARS-Cov-2 infection and should not be used as the sole basis for treatment or other patient management decisions. A negative result may occur with  improper specimen collection/handling, submission of specimen other than nasopharyngeal swab, presence of viral mutation(s) within the areas targeted by this assay, and inadequate number of viral copies(<138 copies/mL). A negative result must be combined with clinical observations, patient history, and epidemiological information. The expected result is Negative.  Fact Sheet for Patients:  04/17/21  Fact Sheet for Healthcare Providers:  BloggerCourse.com  This test is no t yet approved or cleared by the [place] FDA and  has been authorized for detection and/or diagnosis of SARS-CoV-2 by FDA under an Emergency Use Authorization (EUA). This EUA will remain  in effect (meaning this test can be used) for the duration of the COVID-19 declaration under Section 564(b)(1) of the Act, 21 U.S.C.section 360bbb-3(b)(1), unless the authorization is terminated  or revoked sooner.       Influenza A by PCR NEGATIVE NEGATIVE Final   Influenza B by PCR NEGATIVE NEGATIVE Final    Comment: (NOTE) The Xpert Xpress SARS-CoV-2/FLU/RSV plus assay is intended as an aid in the diagnosis of influenza from Nasopharyngeal swab specimens and should not be used as a sole basis for treatment. Nasal washings and aspirates are unacceptable for Xpert Xpress  SARS-CoV-2/FLU/RSV testing.  Fact Sheet for Patients: [place]  Fact Sheet for Healthcare Providers: BloggerCourse.com  This test is not yet approved or cleared by the [place] FDA and has been authorized for detection and/or diagnosis of SARS-CoV-2 by FDA under an Emergency Use Authorization (EUA). This EUA will remain in effect (meaning this test can be used) for the duration of the COVID-19 declaration under Section 564(b)(1) of the Act, 21 U.S.C. section 360bbb-3(b)(1), unless the authorization is terminated or revoked.  Performed at [facility], [address], [place], [place] [address]   Blood culture (routine single)     Status: None (Preliminary result)   Collection Time: 04/22/21  8:55 AM   Specimen: BLOOD  Result Value Ref Range Status   Specimen Description BLOOD RIGHT HAND  Final   Special Requests   Final    BOTTLES DRAWN AEROBIC AND ANAEROBIC Blood Culture results may not be optimal due to an inadequate volume of blood received in culture bottles   Culture   Final    NO GROWTH 2 DAYS Performed at [facility], [address], [place], [place] [address]    Report Status PENDING  Incomplete  Resp Panel by RT-PCR (Flu A&B, Covid) Nasopharyngeal Swab     Status: None   Collection Time: 04/22/21  9:27 AM   Specimen: Nasopharyngeal Swab; Nasopharyngeal(NP) swabs in vial transport medium  Result Value Ref Range Status   SARS Coronavirus 2 by RT PCR NEGATIVE NEGATIVE Final    Comment: (NOTE) SARS-CoV-2 target nucleic acids are NOT DETECTED.  The SARS-CoV-2 RNA is generally detectable in upper respiratory specimens during the acute phase of infection. The lowest concentration of SARS-CoV-2 viral copies this assay can detect is 138 copies/mL. A negative result does not preclude SARS-Cov-2 infection and should not be used as the sole basis for treatment or other patient management decisions. A negative result  may occur with  improper specimen collection/handling, submission of specimen other than nasopharyngeal swab, presence of viral mutation(s) within the areas targeted by this assay, and inadequate number of viral copies(<138 copies/mL). A negative result must be combined with clinical observations, patient history, and epidemiological information. The expected result is Negative.  Fact Sheet for Patients:  BloggerCourse.com  Fact Sheet for Healthcare Providers:  SeriousBroker.it  This test is no t yet approved or cleared by the [place] FDA and  has been authorized for detection and/or diagnosis of SARS-CoV-2 by FDA under an Emergency Use Authorization (EUA). This EUA will remain  in effect (meaning this test can be used) for the duration of the COVID-19 declaration under Section 564(b)(1) of the Act, 21 U.S.C.section 360bbb-3(b)(1), unless the authorization is terminated  or revoked sooner.       Influenza A by PCR NEGATIVE NEGATIVE Final   Influenza B by PCR NEGATIVE NEGATIVE Final    Comment: (NOTE) The Xpert Xpress SARS-CoV-2/FLU/RSV plus assay is intended as an aid in the diagnosis of influenza from Nasopharyngeal swab specimens and should not be used as a sole basis for treatment. Nasal washings and aspirates are unacceptable for Xpert Xpress SARS-CoV-2/FLU/RSV testing.  Fact Sheet for Patients: BloggerCourse.com  Fact Sheet for Healthcare Providers: SeriousBroker.it  This test is not yet approved or cleared by the [place] FDA and has been authorized for detection and/or diagnosis of SARS-CoV-2 by FDA under an Emergency Use Authorization (EUA). This EUA will remain in effect (meaning this test can be used) for the duration of the COVID-19 declaration under Section 564(b)(1) of the Act, 21 U.S.C. section 360bbb-3(b)(1), unless the authorization is terminated  or revoked.  Performed at [facility], [address], [place], [place] [address]   MRSA Next Gen by PCR, Nasal     Status: None   Collection Time: 04/23/21 11:37 AM   Specimen: Nasal Mucosa; Nasal Swab  Result Value Ref Range Status   MRSA by PCR Next Gen NOT DETECTED NOT DETECTED Final    Comment: (NOTE) The GeneXpert MRSA Assay (FDA approved for NASAL  specimens only), is one component of a comprehensive MRSA colonization surveillance program. It is not intended to diagnose MRSA infection nor to guide or monitor treatment for MRSA infections. Test performance is not FDA approved in patients less than 59 years old. Performed at [facility], [address] [place], [place] [address]      Scheduled Meds:  ARIPiprazole  30 mg Oral Daily   aspirin EC  81 mg Oral q morning   Chlorhexidine Gluconate Cloth  6 each Topical Daily   clotrimazole  10 mg Oral 5 X Daily   docusate sodium  100 mg Oral TID   heparin  5,000 Units Subcutaneous Q8H   insulin aspart  0-15 Units Subcutaneous TID WC   insulin aspart  0-5 Units Subcutaneous QHS   insulin glargine-yfgn  30 Units Subcutaneous QHS   ipratropium-albuterol  3 mL Nebulization TID   isosorbide mononitrate  30 mg Oral Daily   metoprolol tartrate  2.5 mg Intravenous Q6H   mirabegron ER  25 mg Oral BID   nystatin   Topical BID   pravastatin  40 mg Oral q1800   Continuous Infusions:  ceFEPime (MAXIPIME) IV Stopped (04/24/21 1008)    Procedures/Studies: DG Chest 1 View  Result Date: 04/22/2021 CLINICAL DATA:  Fall today. PT c/o pain in pelvis and Rt shoulder/Rt upper chest since fall. Hx COPD, nonsmoker, diabetes, HTN, obesity EXAM: CHEST  1 VIEW COMPARISON:  04/15/2021 FINDINGS: Lungs are clear. Heart size upper limits normal. Aortic Atherosclerosis (ICD10-170.0). No effusion.  No pneumothorax. Right shoulder dislocation IMPRESSION: No acute cardiopulmonary disease. Right shoulder dislocation Electronically Signed   By: Corlis Leak M.D.    On: 04/22/2021 10:10   DG Pelvis 1-2 Views  Result Date: 04/22/2021 CLINICAL DATA:  Pain post fall EXAM: PELVIS - 1-2 VIEW COMPARISON:  CT 04/11/2014 FINDINGS: There is no evidence of pelvic fracture or diastasis. The right femoral neck is not well profiled. Chronic osteitis pubis. Degenerative changes in the lower lumbar spine. Surgical clips in the left lower quadrant. Bilateral pelvic phleboliths. No pelvic bone lesions are seen. IMPRESSION: No acute findings. Electronically Signed   By: Corlis Leak M.D.   On: 04/22/2021 10:12   DG Shoulder Right  Result Date: 04/22/2021 CLINICAL DATA:  Right shoulder dislocation. EXAM: DG C-ARM 1-60 MIN; RIGHT SHOULDER - 2+ VIEW FLUOROSCOPY TIME:  Fluoroscopy Time:  48 seconds. Number of Acquired Spot Images: 3 COMPARISON:  04/22/2021. FINDINGS: Three C-arm fluoroscopic images were obtained intraoperatively and submitted for post operative interpretation. These images demonstrate closed reduction of the right shoulder. The shoulder appears to be located. Please see the performing provider's procedural report for further detail. IMPRESSION: Intraoperative fluoroscopy during shoulder reduction. The right shoulder appears to be located. Post reduction radiographs could confirm and better evaluate for fracture. Electronically Signed   By: Feliberto Harts MD   On: 04/22/2021 16:25   DG Shoulder Right  Result Date: 04/22/2021 CLINICAL DATA:  Pain post fall EXAM: RIGHT SHOULDER - 2+ VIEW COMPARISON:  None. FINDINGS: Anterior right shoulder dislocation. Small cortical fragments project at the posterior margin of the glenoid. Proximal humerus appears intact. IMPRESSION: Anterior shoulder dislocation Electronically Signed   By: Corlis Leak M.D.   On: 04/22/2021 10:13   CT Head Wo Contrast  Result Date: 04/22/2021 CLINICAL DATA:  Larey Seat, pain EXAM: CT HEAD WITHOUT CONTRAST TECHNIQUE: Contiguous axial images were obtained from the base of the skull through the vertex without  intravenous contrast. COMPARISON:  02/07/2012 FINDINGS: Brain:  Mild parenchymal atrophy. Patchy areas of hypoattenuation in deep and periventricular white matter bilaterally. Negative for acute intracranial hemorrhage, mass lesion, acute infarction, midline shift, or mass-effect. Acute infarct may be inapparent on noncontrast CT. Ventricles and sulci symmetric. Vascular: No hyperdense vessel or unexpected calcification. Skull: Normal. Negative for fracture or focal lesion. Sinuses/Orbits: No acute finding. Other: None IMPRESSION: 1. Negative for bleed or other acute intracranial process. 2. Atrophy and nonspecific white matter changes. Electronically Signed   By: Corlis Leak  Hassell M.D.   On: 04/22/2021 10:09   CT CHEST WO CONTRAST  Result Date: 04/22/2021 CLINICAL DATA:  Dyspnea, chronic, unclear etiology Respiratory illness, nondiagnostic xray EXAM: CT CHEST WITHOUT CONTRAST TECHNIQUE: Multidetector CT imaging of the chest was performed following the standard protocol without IV contrast. COMPARISON:  CT chest abdomen pelvis 04/11/2014 FINDINGS: Cardiovascular: Normal heart size. Trace pericardial effusion. The thoracic aorta is normal in caliber. At least mild atherosclerotic plaque of the thoracic aorta. At least 2 vessel coronary artery calcifications. Question enlarged main pulmonary artery. Mediastinum/Nodes: No gross hilar adenopathy, noting limited sensitivity for the detection of hilar adenopathy on this noncontrast study. No enlarged mediastinal or axillary lymph nodes. Thyroid gland, trachea, and esophagus demonstrate no significant findings. Lungs/Pleura: Bilateral dependent posterior subsegmental atelectasis. Bilateral lower lobe base linear atelectasis versus scarring. No focal consolidation. Few scattered calcified and noncalcified pulmonary micronodules. No pulmonary mass. No pleural effusion. No pneumothorax. Upper Abdomen: There is a 1.6 cm fluid density lesion at the superior pole of the left kidney  likely represents a simple renal cyst. Otherwise no acute abnormality. Musculoskeletal: No chest wall abnormality. No suspicious lytic or blastic osseous lesions. No acute displaced fracture. Multilevel degenerative changes of the spine. Old posterior left third rib fracture. IMPRESSION: 1. No acute intrathoracic abnormality with limited evaluation on this noncontrast study. 2. Question enlarged main pulmonary artery suggestive of pulmonary hypertension. 3. Aortic Atherosclerosis (ICD10-I70.0) including coronary calcifications. Electronically Signed   By: Tish FredericksonMorgane  Naveau M.D.   On: 04/22/2021 20:52   US RENAL  Result Date: 04/22/2021 CLINICAL DATA:  Dyspnea.  Chronic stage III A renal disease. EXAM: RENAL / URINARY TRACT ULTRASOUND COMPLETE COMPARISON:  None. FINDINGS: Right Kidney: Renal measurements: 13.2 x 6.5 x 6.5 cm = volume: 292 mL. Echogenicity within normal limits. No mass or hydronephrosis visualized. Left Kidney: Limited evaluation. Renal measurements: 14.7 x 6.8 x 8.1 cm= volume: 423 mL. Echogenicity within normal limits. There is a 3.7 x 3.2 x 3.7 cm cystic lesion within the left kidney. No solid mass or hydronephrosis visualized. Urinary bladder: Appears normal for degree of bladder distention. Other: None. IMPRESSION: Grossly unremarkable renal ultrasound with a 3.7 cm left simple renal cyst. Please note otherwise limited evaluation of the left kidney. Electronically Signed   By: Tish FredericksonMorgane  Naveau M.D.   On: 04/22/2021 20:05   US Venous Img Lower Bilateral (DVT)  Result Date: 04/23/2021 CLINICAL DATA:  Chronic lower extremity pain and edema EXAM: BILATERAL LOWER EXTREMITY VENOUS DOPPLER ULTRASOUND TECHNIQUE: Gray-scale sonography with graded compression, as well as color Doppler and duplex ultrasound were performed to evaluate the lower extremity deep venous systems from the level of the common femoral vein and including the common femoral, femoral, profunda femoral, popliteal and calf veins  including the posterior tibial, peroneal and gastrocnemius veins when visible. The superficial great saphenous vein was also interrogated. Spectral Doppler was utilized to evaluate flow at rest and with distal augmentation maneuvers in the common femoral, femoral and popliteal veins. COMPARISON:  None. FINDINGS:  RIGHT LOWER EXTREMITY Common Femoral Vein: No evidence of thrombus. Normal compressibility, respiratory phasicity and response to augmentation. Saphenofemoral Junction: No evidence of thrombus. Normal compressibility and flow on color Doppler imaging. Profunda Femoral Vein: No evidence of thrombus. Normal compressibility and flow on color Doppler imaging. Femoral Vein: No evidence of thrombus. Normal compressibility, respiratory phasicity and response to augmentation. Popliteal Vein: No evidence of thrombus. Normal compressibility, respiratory phasicity and response to augmentation. Calf Veins: Limited visualization because of body habitus. Tibial peroneal veins appear grossly patent and compressible. LEFT LOWER EXTREMITY Common Femoral Vein: No evidence of thrombus. Normal compressibility, respiratory phasicity and response to augmentation. Saphenofemoral Junction: No evidence of thrombus. Normal compressibility and flow on color Doppler imaging. Profunda Femoral Vein: No evidence of thrombus. Normal compressibility and flow on color Doppler imaging. Femoral Vein: No evidence of thrombus. Normal compressibility, respiratory phasicity and response to augmentation. Popliteal Vein: No evidence of thrombus. Normal compressibility, respiratory phasicity and response to augmentation. Calf Veins: Again limited because of body habitus. Posterior tibial vein appears patent. Peroneal vein not visualized. IMPRESSION: No significant femoropopliteal DVT in either extremity. Limited assessment of the calf veins because of body habitus. Electronically Signed   By: Judie Petit.  Shick M.D.   On: 04/23/2021 08:02   DG CHEST PORT 1  VIEW  Result Date: 04/23/2021 CLINICAL DATA:  Shortness of breath.  Acute respiratory failure. EXAM: PORTABLE CHEST 1 VIEW COMPARISON:  April 22, 2021 FINDINGS: Stable cardiomegaly. The hila and mediastinum are not significantly changed given the low volume rotated portable technique. Increased interstitial markings bilaterally. Mild opacity in the bases, likely atelectasis. No other acute abnormalities. IMPRESSION: Findings are favored to represent cardiomegaly with pulmonary venous congestion/mild edema and bibasilar opacities, likely atelectasis. Electronically Signed   By: Gerome Sam III M.D   On: 04/23/2021 11:29   DG Chest Port 1 View  Result Date: 04/15/2021 CLINICAL DATA:  sob EXAM: PORTABLE CHEST 1 VIEW COMPARISON:  April 16, 2014 FINDINGS: The cardiomediastinal silhouette is unchanged and enlarged in contour.Atherosclerotic calcifications. No pleural effusion. No pneumothorax. Perihilar vascular prominence without overt edema. Visualized abdomen is unremarkable. No acute osseous abnormality. IMPRESSION: No acute cardiopulmonary abnormality. Electronically Signed   By: Meda Klinefelter MD   On: 04/15/2021 15:55   DG C-Arm 1-60 Min  Result Date: 04/22/2021 CLINICAL DATA:  Right shoulder dislocation. EXAM: DG C-ARM 1-60 MIN; RIGHT SHOULDER - 2+ VIEW FLUOROSCOPY TIME:  Fluoroscopy Time:  48 seconds. Number of Acquired Spot Images: 3 COMPARISON:  04/22/2021. FINDINGS: Three C-arm fluoroscopic images were obtained intraoperatively and submitted for post operative interpretation. These images demonstrate closed reduction of the right shoulder. The shoulder appears to be located. Please see the performing provider's procedural report for further detail. IMPRESSION: Intraoperative fluoroscopy during shoulder reduction. The right shoulder appears to be located. Post reduction radiographs could confirm and better evaluate for fracture. Electronically Signed   By: Feliberto Harts MD   On: 04/22/2021  16:25   ECHOCARDIOGRAM COMPLETE  Result Date: 04/24/2021    ECHOCARDIOGRAM REPORT   Patient Name:   [name] Date of Exam: 04/23/2021 Medical Rec #:  [id number]       Height:       68.0 in Accession #:    [id number]      Weight:       346.8 lb Date of Birth:  Nov 11, 1965       BSA:          2.582 m Patient Age:  55 years        BP:           160/67 mmHg Patient Gender: F               HR:           94 bpm. Exam Location:  Inpatient Procedure: 2D Echo, Cardiac Doppler and Color Doppler Indications:    Dyspnea R06.00  History:        Patient has prior history of Echocardiogram examinations, most                 recent 04/19/2014. COPD; Risk Factors:Hypertension and Diabetes.                 Parkinson's Disease. Seizures.  Sonographer:    Tiffany Dance Referring Phys: [phone] Kirkland Figg  Sonographer Comments: Echo performed with patient supine and on artificial respirator and Technically difficult study due to poor echo windows. IMPRESSIONS  1. Left ventricular ejection fraction, by estimation, is 70 to 75%. The left ventricle has hyperdynamic function. The left ventricle has no regional wall motion abnormalities. There is mild left ventricular hypertrophy. Left ventricular diastolic parameters are indeterminate.  2. Right ventricular systolic function is mildly reduced. The right ventricular size is mildly enlarged. Tricuspid regurgitation signal is inadequate for assessing PA pressure.  3. Left atrial size was mildly dilated.  4. Right atrial size was mildly dilated.  5. A small pericardial effusion is present.  6. The mitral valve is degenerative. Trivial mitral valve regurgitation. Mild mitral stenosis. The mean mitral valve gradient is 4.6 mmHg with average heart rate of 95 bpm. Severe mitral annular calcification.  7. The aortic valve is grossly normal. There is mild calcification of the aortic valve. Aortic valve regurgitation is not visualized. No aortic stenosis is present.  8. Increased flow velocities  may be secondary to anemia, thyrotoxicosis, hyperdynamic or high flow state. 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 Left ventricular ejection fraction, by estimation, is 70 to 75%. The left ventricle has hyperdynamic function. The left ventricle has no regional wall motion abnormalities.  The left ventricular internal cavity size was normal in size. There is mild left ventricular hypertrophy. Left ventricular diastolic parameters are indeterminate. Right Ventricle: The right ventricular size is mildly enlarged. Right vetricular wall thickness was not well visualized. Right ventricular systolic function is mildly reduced. Tricuspid regurgitation signal is inadequate for assessing PA pressure. Left Atrium: Left atrial size was mildly dilated. Right Atrium: Right atrial size was mildly dilated. Pericardium: A small pericardial effusion is present. Mitral Valve: The mitral valve is degenerative in appearance. Severe mitral annular calcification. Trivial mitral valve regurgitation. Mild mitral valve stenosis. The mean mitral valve gradient is 4.6 mmHg with average heart rate of 95 bpm. Tricuspid Valve: The tricuspid valve is grossly normal. Tricuspid valve regurgitation is trivial. No evidence of tricuspid stenosis. Aortic Valve: The aortic valve is grossly normal. There is mild calcification of the aortic valve. Aortic valve regurgitation is not visualized. No aortic stenosis is present. Pulmonic Valve: The pulmonic valve was grossly normal. Pulmonic valve regurgitation is trivial. No evidence of pulmonic stenosis. Aorta: The aortic root is normal in size and structure. Venous: The inferior vena cava was not well visualized. IVC assessment for right atrial pressure unable to be performed due to mechanical ventilation. IAS/Shunts: No atrial level shunt detected by color flow Doppler.  LEFT VENTRICLE PLAX 2D LVIDd:  4.04 cm  Diastology LVIDs:         2.71 cm  LV  e' medial:    6.22 cm/s LV PW:         1.30 cm  LV E/e' medial:  19.9 LV IVS:        1.11 cm  LV e' lateral:   9.20 cm/s LVOT diam:     1.90 cm  LV E/e' lateral: 13.5 LV SV:         106 LV SV Index:   41 LVOT Area:     2.84 cm  RIGHT VENTRICLE RV Basal diam:  3.98 cm RV Mid diam:    2.75 cm RV S prime:     16.20 cm/s TAPSE (M-mode): 2.0 cm LEFT ATRIUM             Index       RIGHT ATRIUM           Index LA diam:        3.30 cm 1.28 cm/m  RA Area:     18.60 cm LA Vol (A2C):   65.4 ml 25.33 ml/m RA Volume:   55.10 ml  21.34 ml/m LA Vol (A4C):   41.0 ml 15.88 ml/m LA Biplane Vol: 50.1 ml 19.40 ml/m  AORTIC VALVE LVOT Vmax:   181.00 cm/s LVOT Vmean:  124.000 cm/s LVOT VTI:    0.375 m  AORTA Ao Root diam: 3.10 cm Ao Asc diam:  3.50 cm MITRAL VALVE MV Area (PHT): 2.29 cm     SHUNTS MV Area VTI:   3.14 cm     Systemic VTI:  0.38 m MV Mean grad:  4.6 mmHg     Systemic Diam: 1.90 cm MV VTI:        0.34 m MV Decel Time: 331 msec MV E velocity: 124.00 cm/s MV A velocity: 144.00 cm/s MV E/A ratio:  0.86 Weston Brass MD Electronically signed by Weston Brass MD Signature Date/Time: 04/24/2021/9:10:32 AM    Final     Catarina Hartshorn, DO  [facility]  If 7PM-7AM, please contact night-coverage [email or web address] Password [email or web address] 04/24/2021, 2:16 PM   LOS: 2 days

## 2021-04-25 ENCOUNTER — Encounter (HOSPITAL_COMMUNITY): Payer: Self-pay | Admitting: Internal Medicine

## 2021-04-25 ENCOUNTER — Encounter (HOSPITAL_COMMUNITY)
Admission: EM | Disposition: A | Discharge status: Skilled Nursing Facility | Payer: Self-pay | Source: Home / Self Care | Attending: Internal Medicine

## 2021-04-25 ENCOUNTER — Inpatient Hospital Stay (HOSPITAL_COMMUNITY): Payer: Medicaid Other | Admitting: Certified Registered Nurse Anesthetist

## 2021-04-25 ENCOUNTER — Inpatient Hospital Stay (HOSPITAL_COMMUNITY): Payer: Medicaid Other

## 2021-04-25 DIAGNOSIS — J9622 Acute and chronic respiratory failure with hypercapnia: Secondary | ICD-10-CM | POA: Diagnosis not present

## 2021-04-25 DIAGNOSIS — S4291XA Fracture of right shoulder girdle, part unspecified, initial encounter for closed fracture: Secondary | ICD-10-CM | POA: Diagnosis present

## 2021-04-25 DIAGNOSIS — N179 Acute kidney failure, unspecified: Secondary | ICD-10-CM | POA: Diagnosis not present

## 2021-04-25 DIAGNOSIS — S43014S Anterior dislocation of right humerus, sequela: Secondary | ICD-10-CM | POA: Diagnosis not present

## 2021-04-25 DIAGNOSIS — J9621 Acute and chronic respiratory failure with hypoxia: Secondary | ICD-10-CM | POA: Diagnosis not present

## 2021-04-25 HISTORY — PX: SHOULDER CLOSED REDUCTION: SHX1051

## 2021-04-25 LAB — CBC
HCT: 47.7 % — ABNORMAL HIGH (ref 36.0–46.0)
Hemoglobin: 14.3 g/dL (ref 12.0–15.0)
MCH: 27.8 pg (ref 26.0–34.0)
MCHC: 30 g/dL (ref 30.0–36.0)
MCV: 92.8 fL (ref 80.0–100.0)
Platelets: 174 10*3/uL (ref 150–400)
RBC: 5.14 MIL/uL — ABNORMAL HIGH (ref 3.87–5.11)
RDW: 15 % (ref 11.5–15.5)
WBC: 5.8 10*3/uL (ref 4.0–10.5)
nRBC: 0 % (ref 0.0–0.2)

## 2021-04-25 LAB — URINE CULTURE: Culture: NO GROWTH

## 2021-04-25 LAB — GLUCOSE, CAPILLARY
Glucose-Capillary: 202 mg/dL — ABNORMAL HIGH (ref 70–99)
Glucose-Capillary: 190 mg/dL — ABNORMAL HIGH (ref 70–99)
Glucose-Capillary: 213 mg/dL — ABNORMAL HIGH (ref 70–99)
Glucose-Capillary: 415 mg/dL — ABNORMAL HIGH (ref 70–99)

## 2021-04-25 LAB — GLUCOSE, RANDOM: Glucose, Bld: 353 mg/dL — ABNORMAL HIGH (ref 70–99)

## 2021-04-25 LAB — BASIC METABOLIC PANEL
Anion gap: 11 (ref 5–15)
BUN: 26 mg/dL — ABNORMAL HIGH (ref 6–20)
CO2: 30 mmol/L (ref 22–32)
Calcium: 8.5 mg/dL — ABNORMAL LOW (ref 8.9–10.3)
Chloride: 99 mmol/L (ref 98–111)
Creatinine, Ser: 1.3 mg/dL — ABNORMAL HIGH (ref 0.44–1.00)
GFR, Estimated: 48 mL/min — ABNORMAL LOW (ref >60)
Glucose, Bld: 214 mg/dL — ABNORMAL HIGH (ref 70–99)
Potassium: 4.4 mmol/L (ref 3.5–5.1)
Sodium: 140 mmol/L (ref 135–145)

## 2021-04-25 SURGERY — CLOSED REDUCTION, SHOULDER
Anesthesia: Monitor Anesthesia Care | Site: Shoulder | Laterality: Right

## 2021-04-25 MED ORDER — METOPROLOL TARTRATE 5 MG/5ML IV SOLN
2.5000 mg | Freq: Once | INTRAVENOUS | Status: AC
  Administered 2021-04-25: 2.5 mg via INTRAVENOUS

## 2021-04-25 MED ORDER — FENTANYL CITRATE (PF) 100 MCG/2ML IJ SOLN
INTRAMUSCULAR | Status: AC
  Filled 2021-04-25: qty 2

## 2021-04-25 MED ORDER — FENTANYL CITRATE (PF) 100 MCG/2ML IJ SOLN
INTRAMUSCULAR | Status: DC | PRN
  Administered 2021-04-25: 50 ug via INTRAVENOUS

## 2021-04-25 MED ORDER — KETOROLAC TROMETHAMINE 30 MG/ML IJ SOLN
30.0000 mg | Freq: Once | INTRAMUSCULAR | Status: AC
  Administered 2021-04-25: 30 mg via INTRAVENOUS
  Filled 2021-04-25: qty 1

## 2021-04-25 MED ORDER — LACTATED RINGERS IV SOLN: INTRAVENOUS | Status: DC

## 2021-04-25 MED ORDER — SUCCINYLCHOLINE CHLORIDE 200 MG/10ML IV SOSY
PREFILLED_SYRINGE | INTRAVENOUS | Status: DC | PRN
  Administered 2021-04-25: 120 mg via INTRAVENOUS

## 2021-04-25 MED ORDER — DEXAMETHASONE SODIUM PHOSPHATE 10 MG/ML IJ SOLN
INTRAMUSCULAR | Status: AC
  Filled 2021-04-25: qty 1

## 2021-04-25 MED ORDER — SUCCINYLCHOLINE CHLORIDE 200 MG/10ML IV SOSY
PREFILLED_SYRINGE | INTRAVENOUS | Status: AC
  Filled 2021-04-25: qty 10

## 2021-04-25 MED ORDER — ORAL CARE MOUTH RINSE: 15.0000 mL | Freq: Once | OROMUCOSAL | Status: AC

## 2021-04-25 MED ORDER — ONDANSETRON HCL 4 MG/2ML IJ SOLN
INTRAMUSCULAR | Status: AC
  Filled 2021-04-25: qty 2

## 2021-04-25 MED ORDER — ONDANSETRON HCL 4 MG/2ML IJ SOLN: 4.0000 mg | Freq: Once | INTRAMUSCULAR | Status: DC | PRN

## 2021-04-25 MED ORDER — PROPOFOL 10 MG/ML IV BOLUS
INTRAVENOUS | Status: DC | PRN
  Administered 2021-04-25: 130 mg via INTRAVENOUS

## 2021-04-25 MED ORDER — DEXAMETHASONE SODIUM PHOSPHATE 10 MG/ML IJ SOLN
INTRAMUSCULAR | Status: DC | PRN
  Administered 2021-04-25: 4 mg via INTRAVENOUS

## 2021-04-25 MED ORDER — FENTANYL CITRATE (PF) 100 MCG/2ML IJ SOLN: 25.0000 ug | INTRAMUSCULAR | Status: DC | PRN

## 2021-04-25 MED ORDER — LIDOCAINE 2% (20 MG/ML) 5 ML SYRINGE
INTRAMUSCULAR | Status: DC | PRN
  Administered 2021-04-25: 100 mg via INTRAVENOUS

## 2021-04-25 MED ORDER — METOPROLOL TARTRATE 5 MG/5ML IV SOLN
INTRAVENOUS | Status: AC
  Administered 2021-04-25: 2.5 mg
  Filled 2021-04-25: qty 5

## 2021-04-25 MED ORDER — CHLORHEXIDINE GLUCONATE 0.12 % MT SOLN
15.0000 mL | Freq: Once | OROMUCOSAL | Status: AC
  Administered 2021-04-25: 15 mL via OROMUCOSAL

## 2021-04-25 MED ORDER — MIDAZOLAM HCL 2 MG/2ML IJ SOLN
INTRAMUSCULAR | Status: AC
  Filled 2021-04-25: qty 2

## 2021-04-25 MED ORDER — KETAMINE HCL 10 MG/ML IJ SOLN
INTRAMUSCULAR | Status: DC | PRN
  Administered 2021-04-25: 20 mg via INTRAVENOUS

## 2021-04-25 MED ORDER — LIDOCAINE HCL (PF) 2 % IJ SOLN
INTRAMUSCULAR | Status: AC
  Filled 2021-04-25: qty 5

## 2021-04-25 MED ORDER — METOPROLOL TARTRATE 5 MG/5ML IV SOLN: 2.5000 mg | Freq: Once | INTRAVENOUS | Status: DC

## 2021-04-25 MED ORDER — ONDANSETRON HCL 4 MG/2ML IJ SOLN
INTRAMUSCULAR | Status: DC | PRN
  Administered 2021-04-25: 4 mg via INTRAVENOUS

## 2021-04-25 MED ORDER — MIDAZOLAM HCL 5 MG/5ML IJ SOLN
INTRAMUSCULAR | Status: DC | PRN
  Administered 2021-04-25: 1 mg via INTRAVENOUS

## 2021-04-25 MED ORDER — KETAMINE HCL 50 MG/5ML IJ SOSY
PREFILLED_SYRINGE | INTRAMUSCULAR | Status: AC
  Filled 2021-04-25: qty 5

## 2021-04-25 SURGICAL SUPPLY — 2 items
CLOTH BEACON ORANGE TIMEOUT ST (SAFETY) ×2 IMPLANT
KIT TURNOVER KIT A (KITS) ×2 IMPLANT

## 2021-04-25 NOTE — Anesthesia Procedure Notes (Signed)
Procedure Name: Intubation Date/Time: 04/25/2021 1:31 PM Performed by: Julian Reil, CRNA Pre-anesthesia Checklist: Patient identified, Emergency Drugs available, Suction available and Patient being monitored Patient Re-evaluated:Patient Re-evaluated prior to induction Oxygen Delivery Method: Circle system utilized Preoxygenation: Pre-oxygenation with 100% oxygen Induction Type: IV induction Laryngoscope Size: Glidescope and 3 Grade View: Grade I Tube type: Oral Tube size: 7.5 mm Number of attempts: 1 Airway Equipment and Method: Stylet Placement Confirmation: ETT inserted through vocal cords under direct vision, positive ETCO2 and breath sounds checked- equal and bilateral Secured at: 23 cm Tube secured with: Tape Dental Injury: Teeth and Oropharynx as per pre-operative assessment  Comments: Glidescope used for large tongue, short thick neck, grade 4 MP

## 2021-04-25 NOTE — Brief Op Note (Signed)
04/25/2021  1:50 PM  PATIENT:  [name]  55 y.o. female  PRE-OPERATIVE DIAGNOSIS:  recurrent dislocation right shoulder  POST-OPERATIVE DIAGNOSIS:  recurrent dislocation right shoulder  PROCEDURE:  Procedure(s): CLOSED REDUCTION SHOULDER (Right)  SURGEON:  Surgeon(s) and Role:    Vickki Hearing, MD - Primary    * Oliver Barre, MD - Assisting  PHYSICIAN ASSISTANT:   ASSISTANTS: Dr Dallas Schimke   ANESTHESIA:   general  EBL: n/a  BLOOD ADMINISTERED:none  DRAINS: none   LOCAL MEDICATIONS USED:  MARCAINE     SPECIMEN:  No Specimen  DISPOSITION OF SPECIMEN:  N/A  COUNTS:  YES  TOURNIQUET:  * No tourniquets in log *  DICTATION: .Dragon Dictation  PLAN OF CARE: Admit to inpatient   PATIENT DISPOSITION:  PACU - hemodynamically stable.   Delay start of Pharmacological VTE agent (>24hrs) due to surgical blood loss or risk of bleeding: not applicable

## 2021-04-25 NOTE — Plan of Care (Signed)
  Problem: Acute Rehab PT Goals(only PT should resolve) Goal: Pt Will Go Supine/Side To Sit Flowsheets (Taken 04/25/2021 0955) Pt will go Supine/Side to Sit: with moderate assist Goal: Pt Will Go Sit To Supine/Side Flowsheets (Taken 04/25/2021 0955) Pt will go Sit to Supine/Side: with moderate assist Goal: Patient Will Perform Sitting Balance Flowsheets (Taken 04/25/2021 0955) Patient will perform sitting balance: with min guard assist Goal: Pt Will Transfer Bed To Chair/Chair To Bed Flowsheets (Taken 04/25/2021 0955) Pt will Transfer Bed to Chair/Chair to Bed: with mod assist Goal: Pt Will Perform Standing Balance Or Pre-Gait Flowsheets (Taken 04/25/2021 0955) Pt will perform standing balance or pre-gait: with minimal assist

## 2021-04-25 NOTE — Anesthesia Postprocedure Evaluation (Signed)
Anesthesia Post Note  Patient: [name]  Procedure(s) Performed: CLOSED REDUCTION SHOULDER (Right: Shoulder)  Patient location during evaluation: PACU Anesthesia Type: MAC Level of consciousness: awake and alert and oriented Pain management: pain level controlled Vital Signs Assessment: post-procedure vital signs reviewed and stable Respiratory status: spontaneous breathing, respiratory function stable and patient connected to nasal cannula oxygen Cardiovascular status: blood pressure returned to baseline and stable Postop Assessment: no apparent nausea or vomiting Anesthetic complications: no   No notable events documented.   Last Vitals:  Vitals:   04/25/21 1202 04/25/21 1350  BP: 128/76 135/88  Pulse:  (!) 103  Resp:  14  Temp:  (!) 38.2 C  SpO2:  94%    Last Pain:  Vitals:   04/25/21 1350  TempSrc:   PainSc: 4                  Mouhamed Glassco C Kane Kusek

## 2021-04-25 NOTE — Progress Notes (Signed)
PROGRESS NOTE  Starling MannsJanet F Leder ZOX:[id number] DOB: 1966/03/17 DOA: 04/22/2021 [facility]   Brief History:   55 y.o. female with medical history of current impairment, bipolar disorder, seizure disorder, hypertension, COPD, chronic respiratory failure on 3 L, diabetes mellitus type 2, CKD stage III presenting with mechanical fall on the a.m. of 04/22/2021.  The patient is a difficult historian secondary to cognitive impairment.  History is also supplemented by the patient's spouse.  Unfortunately, he is also a difficult historian.  Nevertheless, it appears that the patient had a mechanical fall getting out of bed on the morning of 04/22/2021.  She has not had a history of frequent falls.  She last fell over 1 year prior to this admission.  There was no loss of consciousness this morning.  After the fall, the patient was complaining of pain all over.  As result, the patient was brought to emergency department for further evaluation.  Notably, the patient was noted to have a fever by EMS of 101.0 F.  She was given acetaminophen by EMS.  Prior to my assessment, the patient was noted to have an increasing oxygen requirement.  She was placed on 6 L nasal cannula.  Her spouse relates that the patient has been having more dyspnea on exertion over the past 3 to 4 weeks.  Apparently, she had a telehealth visit with her PCP a couple weeks prior to this admission.  She was given some type of medication which the spouse and patient do not recall.  There was some mild improvement in her breathing, but she complains of shortness of breath with exertion once again.  She denies any chest pain, headache, nausea, vomiting, diarrhea, abdominal pain.   04/23/21 10AM--I arrive to find patient somnolent but aroused to voice and answered yes/no to simple questions Patient stated she was thirsty but denied sob, cp. Upon checking oxygen saturation myself personally, she was 46%.  I checked  the nasal cannula in the patient's nose and followed the tubing the end of which was on the floor (not connected to the wall mounted oxygen). Then I looked at the wall mounted oxygen device which was connected to oxygen tubing.  I followed the oxygen tubing connected to the wall and it terminated (was connected) to a nebulizer mask which was sitting in a bag hanging on the wall. I connected the patient's nasal cannula tubing from her nose to the oxygen and increased it to 6L.  Over a period of 3-5 min.  Her saturation increased to 91-92%.  I then ordered STAT ABG and CXR and placed orders for transfer to SDU.  On 04/24/21, patient was being bathed and her sling was removed.  The patient complained of pain in her right shoulder again.  Repeat xray showed recurrent anterior dislocation.  Dr. Romeo AppleHarrison was notified and she was taken back to OR 04/25/21 to have another closed reduction under conscious sedation.     Assessment/Plan: Acute on chronic respiratory failure with hypoxia and hypercarbia -Obtain ABG 7.281/72/85/28 on 0.5 -Check BNP--606 -Suspect this is likely due to the patient's OSA/OHS -She has never smoked -Chronically on 3 L nasal cannula at home -04/23/21--placed on bipap due to somnolence and hypercarbia -Currently on 6 L -Goal is to keep saturation greater 90% -CT chest--bibasilar atelectasis, no infiltrates or effusions -Echo EF 70-75%, no WMA; mild decrease RV function, mild AS -d/c clonazepam -continue duonebs -wean oxygen for saturation >  90%   Anterior right shoulder dislocation -Orthopedics, Dr. Romeo Apple consult appreciated -7/29--closed reduction of shoulder dislocation -minimize hypnotic meds -Judicious opioids/pain control -04/25/21--repeat closed reduction   Acute on chronic renal failure--CKD 3a -Difficult to clarify whether the patient has a degree of progression of underlying CKD -Previous baseline creatinine 1.0-1.4 -Renal ultrasound--no hydronephrosis    Fever -Check PCT--0.16 -Lactic acid 1.1 -continues to have fever up to 100.6 -Follow blood cultures--neg to date -Obtain UA and urine culture--neg -7/29 CT chest--no consolidation -obtain CT abd/pelvis--no acute findings -d/c abx and monitor   Uncontrolled diabetes mellitus type 2 with hyperglycemia -Check A1c--8.5 -Continue reduced dose Lantus -NovoLog sliding scale   Bipolar disorder -Minimize hypnotic medications -Continue Abilify, Cymbalta   Morbid Obesity -BMI 42.27 -lifestyle modification   Leg pain -venous duplex--no DVT   Essential Hypertension -restart inderal and imdur -on IV metoprolol while inderal was held   Hyperlipidemia -restart statin   Low B12 -supplement po -give IM injection x 1             Status is: Inpatient   Remains inpatient appropriate because:Inpatient level of care appropriate due to severity of illness   Dispo: The patient is from: Home              Anticipated d/c is to: Home              Patient currently is not medically stable to d/c.              Difficult to place patient No                 Family Communication:   son updated 8/1   Consultants:  none   Code Status:  FULL   DVT Prophylaxis:  [place] Heparin     Procedures: As Listed in Progress Note Above   Antibiotics: Cefepime 7/30>>8/1   Subjective: Patient complains of right shoulder pain.  Patient denies fevers, chills, headache, chest pain, dyspnea, nausea, vomiting, diarrhea, abdominal pain, dysuria, hematuria, hematochezia, and melena.   Objective: Vitals:   04/25/21 1202 04/25/21 1350 04/25/21 1430 04/25/21 1506  BP: 128/76 135/88    Pulse:  (!) 103    Resp:  14    Temp:  (!) 100.7 F (38.2 C) 99.8 F (37.7 C)   TempSrc:      SpO2:  94%  95%  Weight:      Height:        Intake/Output Summary (Last 24 hours) at 04/25/2021 1621 Last data filed at 04/25/2021 1400 Gross per 24 hour  Intake 1940 ml  Output 2575 ml  Net -635 ml   Weight  change: -3.4 kg Exam:  General:  Pt is alert, follows commands appropriately, not in acute distress HEENT: No icterus, No thrush, No neck mass, [place]/AT Cardiovascular: RRR, S1/S2, no rubs, no gallops Respiratory: bibasilar rales.  No wheeze Abdomen: Soft/+BS, non tender, non distended, no guarding Extremities: 1 + LEedema, No lymphangitis, No petechiae, No rashes, no synovitis   Data Reviewed: I have personally reviewed following labs and imaging studies Basic Metabolic Panel: Recent Labs  Lab 04/22/21 0855 04/23/21 0430 04/24/21 0444 04/25/21 0435  NA 138 146* 145 140  K 4.8 4.9 4.5 4.4  CL 105 108 106 99  CO2 GLUCOSE 286* 126* 73 214*  BUN 60* 42* 28* 26*  CREATININE 2.49* 1.71* 1.35* 1.30*  CALCIUM 8.5* 8.8* 8.8* 8.5*  MG  --  2.0  --   --  Liver Function Tests: Recent Labs  Lab 04/22/21 0855 04/23/21 0430  AST 19 22  ALT 45* 40  ALKPHOS 132* 118  BILITOT 0.9 0.9  PROT 6.0* 6.0*  ALBUMIN 3.4* 3.4*   No results for input(s): LIPASE, AMYLASE in the last 168 hours. Recent Labs  Lab 04/22/21 1835  AMMONIA 28   Coagulation Profile: Recent Labs  Lab 04/22/21 0855  INR 1.1   CBC: Recent Labs  Lab 04/22/21 0855 04/23/21 0430 04/24/21 0444 04/25/21 0435  WBC 8.1 9.9 6.6 5.8  NEUTROABS 6.1  --   --   --   HGB 15.2* 15.2* 14.3 14.3  HCT 51.1* 53.8* 48.0* 47.7*  MCV 95.7 96.9 95.2 92.8  PLT 214 231 195 174   Cardiac Enzymes: No results for input(s): CKTOTAL, CKMB, CKMBINDEX, TROPONINI in the last 168 hours. BNP: Invalid input(s): POCBNP CBG: Recent Labs  Lab 04/24/21 1130 04/24/21 1713 04/24/21 2142 04/25/21 0816 04/25/21 1136  GLUCAP 104* 162* 269* 202* 190*   HbA1C: No results for input(s): HGBA1C in the last 72 hours. Urine analysis:    Component Value Date/Time   COLORURINE YELLOW (A) 05/01/2017 1852   APPEARANCEUR CLEAR (A) 05/01/2017 1852   LABSPEC 1.023 05/01/2017 1852   PHURINE 5.0 05/01/2017 1852   GLUCOSEU >=500  (A) 05/01/2017 1852   HGBUR NEGATIVE 05/01/2017 1852   BILIRUBINUR NEGATIVE 05/01/2017 1852   KETONESUR NEGATIVE 05/01/2017 1852   PROTEINUR 100 (A) 05/01/2017 1852   UROBILINOGEN 0.2 04/11/2014 1927   NITRITE NEGATIVE 05/01/2017 1852   LEUKOCYTESUR NEGATIVE 05/01/2017 1852   Sepsis Labs: (procalcitonin:4,lacticidven:4) ) Recent Results (from the past 240 hour(s))  Blood culture (routine single)     Status: None (Preliminary result)   Collection Time: 04/22/21  8:55 AM   Specimen: BLOOD  Result Value Ref Range Status   Specimen Description BLOOD RIGHT HAND  Final   Special Requests   Final    BOTTLES DRAWN AEROBIC AND ANAEROBIC Blood Culture results may not be optimal due to an inadequate volume of blood received in culture bottles   Culture   Final    NO GROWTH 3 DAYS Performed at [facility], [address], [place], [place] [address]    Report Status PENDING  Incomplete  Resp Panel by RT-PCR (Flu A&B, Covid) Nasopharyngeal Swab     Status: None   Collection Time: 04/22/21  9:27 AM   Specimen: Nasopharyngeal Swab; Nasopharyngeal(NP) swabs in vial transport medium  Result Value Ref Range Status   SARS Coronavirus 2 by RT PCR NEGATIVE NEGATIVE Final    Comment: (NOTE) SARS-CoV-2 target nucleic acids are NOT DETECTED.  The SARS-CoV-2 RNA is generally detectable in upper respiratory specimens during the acute phase of infection. The lowest concentration of SARS-CoV-2 viral copies this assay can detect is 138 copies/mL. A negative result does not preclude SARS-Cov-2 infection and should not be used as the sole basis for treatment or other patient management decisions. A negative result may occur with  improper specimen collection/handling, submission of specimen other than nasopharyngeal swab, presence of viral mutation(s) within the areas targeted by this assay, and inadequate number of viral copies(<138 copies/mL). A negative result must be combined with clinical  observations, patient history, and epidemiological information. The expected result is Negative.  Fact Sheet for Patients:  BloggerCourse.com  Fact Sheet for Healthcare Providers:  SeriousBroker.it  This test is no t yet approved or cleared by the [place] FDA and  has been authorized for detection and/or diagnosis of  SARS-CoV-2 by FDA under an Emergency Use Authorization (EUA). This EUA will remain  in effect (meaning this test can be used) for the duration of the COVID-19 declaration under Section 564(b)(1) of the Act, 21 U.S.C.section 360bbb-3(b)(1), unless the authorization is terminated  or revoked sooner.       Influenza A by PCR NEGATIVE NEGATIVE Final   Influenza B by PCR NEGATIVE NEGATIVE Final    Comment: (NOTE) The Xpert Xpress SARS-CoV-2/FLU/RSV plus assay is intended as an aid in the diagnosis of influenza from Nasopharyngeal swab specimens and should not be used as a sole basis for treatment. Nasal washings and aspirates are unacceptable for Xpert Xpress SARS-CoV-2/FLU/RSV testing.  Fact Sheet for Patients: BloggerCourse.com  Fact Sheet for Healthcare Providers: SeriousBroker.it  This test is not yet approved or cleared by the [place] FDA and has been authorized for detection and/or diagnosis of SARS-CoV-2 by FDA under an Emergency Use Authorization (EUA). This EUA will remain in effect (meaning this test can be used) for the duration of the COVID-19 declaration under Section 564(b)(1) of the Act, 21 U.S.C. section 360bbb-3(b)(1), unless the authorization is terminated or revoked.  Performed at [facility], [address], [place], [place] [address]   MRSA Next Gen by PCR, Nasal     Status: None   Collection Time: 04/23/21 11:37 AM   Specimen: Nasal Mucosa; Nasal Swab  Result Value Ref Range Status   MRSA by PCR Next Gen NOT DETECTED NOT DETECTED  Final    Comment: (NOTE) The GeneXpert MRSA Assay (FDA approved for NASAL specimens only), is one component of a comprehensive MRSA colonization surveillance program. It is not intended to diagnose MRSA infection nor to guide or monitor treatment for MRSA infections. Test performance is not FDA approved in patients less than 93 years old. Performed at [facility], [address], [place], [place] [address]   Urine Culture     Status: None   Collection Time: 04/23/21  6:05 PM   Specimen: Urine, Clean Catch  Result Value Ref Range Status   Specimen Description   Final    URINE, CLEAN CATCH Performed at [facility], [address], [place], [place] [address]    Special Requests   Final    NONE Performed at [facility], [address], [place], [place] [address]    Culture   Final    NO GROWTH Performed at [facility], [address], [place], [place] [address]    Report Status 04/25/2021 FINAL  Final     Scheduled Meds:  ARIPiprazole  30 mg Oral Daily   aspirin EC  81 mg Oral q morning   Chlorhexidine Gluconate Cloth  6 each Topical Daily   clotrimazole  10 mg Oral 5 X Daily   docusate sodium  100 mg Oral TID   heparin  5,000 Units Subcutaneous Q8H   insulin aspart  0-15 Units Subcutaneous TID WC   insulin aspart  0-5 Units Subcutaneous QHS   insulin glargine-yfgn  30 Units Subcutaneous QHS   ipratropium-albuterol  3 mL Nebulization TID   isosorbide mononitrate  30 mg Oral Daily   metoprolol tartrate  2.5 mg Intravenous Q6H   mirabegron ER  25 mg Oral BID   nystatin   Topical BID   pravastatin  40 mg Oral q1800   vitamin B-12  500 mcg Oral Daily   Continuous Infusions:  ceFEPime (MAXIPIME) IV 2 g (04/25/21 1013)    Procedures/Studies: CT ABDOMEN PELVIS WO  CONTRAST  Result Date: 04/24/2021 CLINICAL DATA:  Abdominal pain and fever. Acute on chronic renal failure. Previous hysterectomy. EXAM: CT ABDOMEN AND PELVIS WITHOUT CONTRAST TECHNIQUE: Multidetector CT  imaging of the abdomen and pelvis was performed following the standard protocol without IV contrast. COMPARISON:  04/11/2014 FINDINGS: Lower chest: No acute findings. Hepatobiliary: No mass visualized on this unenhanced exam. Gallbladder is unremarkable. No evidence of biliary ductal dilatation. Pancreas: No mass or inflammatory process visualized on this unenhanced exam. Spleen:  Within normal limits in size. Adrenals/Urinary tract: No evidence of urolithiasis or hydronephrosis. Increased symmetric bilateral perinephric stranding is noted, and consistent with medical renal disease. Image degradation by motion artifact noted. Several lesions are seen in both kidneys which have increased in size since previous study, but cannot be characterized on this unenhanced exam. Urinary bladder is empty with Foley catheter in place. Stomach/Bowel: No evidence of obstruction, inflammatory process, or abnormal fluid collections. Vascular/Lymphatic: No pathologically enlarged lymph nodes identified. No evidence of abdominal aortic aneurysm. Aortic atherosclerotic calcification noted. Reproductive: Prior hysterectomy noted. Adnexal regions are unremarkable in appearance. Other: A small paraumbilical ventral hernia is again seen which contains only fat. New moderate diffuse body wall edema is also seen. Musculoskeletal:  No suspicious bone lesions identified. IMPRESSION: No evidence of urolithiasis or hydronephrosis. Increased symmetric bilateral perinephric stranding, which is nonspecific but likely secondary to medical renal disease. Increased size of several bilateral renal lesions, which cannot be characterized on this unenhanced exam. Abdomen MRI is recommended for further characterization, preferably without and with contrast if renal function permits. New diffuse body wall edema. Stable small paraumbilical ventral hernia containing only fat. Electronically Signed   By: Danae Orleans M.D.   On: 04/24/2021 19:42   DG Chest 1  View  Result Date: 04/22/2021 CLINICAL DATA:  Fall today. PT c/o pain in pelvis and Rt shoulder/Rt upper chest since fall. Hx COPD, nonsmoker, diabetes, HTN, obesity EXAM: CHEST  1 VIEW COMPARISON:  04/15/2021 FINDINGS: Lungs are clear. Heart size upper limits normal. Aortic Atherosclerosis (ICD10-170.0). No effusion.  No pneumothorax. Right shoulder dislocation IMPRESSION: No acute cardiopulmonary disease. Right shoulder dislocation Electronically Signed   By: Corlis Leak M.D.   On: 04/22/2021 10:10   DG Pelvis 1-2 Views  Result Date: 04/22/2021 CLINICAL DATA:  Pain post fall EXAM: PELVIS - 1-2 VIEW COMPARISON:  CT 04/11/2014 FINDINGS: There is no evidence of pelvic fracture or diastasis. The right femoral neck is not well profiled. Chronic osteitis pubis. Degenerative changes in the lower lumbar spine. Surgical clips in the left lower quadrant. Bilateral pelvic phleboliths. No pelvic bone lesions are seen. IMPRESSION: No acute findings. Electronically Signed   By: Corlis Leak M.D.   On: 04/22/2021 10:12   DG Shoulder Right  Result Date: 04/25/2021 CLINICAL DATA:  Right shoulder reduction. EXAM: DG C-ARM 1-60 MIN; RIGHT SHOULDER - 2+ VIEW FLUOROSCOPY TIME:  Fluoroscopy Time:  11 seconds. Number of Acquired Spot Images: 3 COMPARISON:  04/24/2021. FINDINGS: Three C-arm fluoroscopic images were obtained intraoperatively and submitted for post operative interpretation. These images demonstrate reduction of the right shoulder a which appears to be located but potentially slightly superiorly subluxed. Fracture fragment better characterized on prior radiographs. Please see the performing provider's procedural report for further detail. IMPRESSION: Intraoperative fluoroscopy, as detailed above. Electronically Signed   By: Feliberto Harts MD   On: 04/25/2021 15:47   DG Shoulder Right  Result Date: 04/24/2021 CLINICAL DATA:  Anterior shoulder dislocation patient reports dislocated right shoulder yesterday with  continued pain. EXAM: RIGHT SHOULDER - 2+ VIEW COMPARISON:  Shoulder radiograph 04/22/2021. Procedural fluoroscopy 04/22/2021 during shoulder dislocation reduction. FINDINGS: Recurrent anterior dislocation of the humeral head with respect to the glenoid. Small displaced fracture fragments laterally, donor site may be the lateral humeral head, however fragments also project adjacent to the glenoid on the [place] view. IMPRESSION: Recurrent anterior shoulder dislocation. Small displaced fracture fragments laterally, donor site may be the lateral humeral head, however fragments also project adjacent to the glenoid on the [place] view. Electronically Signed   By: Narda Rutherford M.D.   On: 04/24/2021 17:41   DG Shoulder Right  Result Date: 04/22/2021 CLINICAL DATA:  Right shoulder dislocation. EXAM: DG C-ARM 1-60 MIN; RIGHT SHOULDER - 2+ VIEW FLUOROSCOPY TIME:  Fluoroscopy Time:  48 seconds. Number of Acquired Spot Images: 3 COMPARISON:  04/22/2021. FINDINGS: Three C-arm fluoroscopic images were obtained intraoperatively and submitted for post operative interpretation. These images demonstrate closed reduction of the right shoulder. The shoulder appears to be located. Please see the performing provider's procedural report for further detail. IMPRESSION: Intraoperative fluoroscopy during shoulder reduction. The right shoulder appears to be located. Post reduction radiographs could confirm and better evaluate for fracture. Electronically Signed   By: Feliberto Harts MD   On: 04/22/2021 16:25   DG Shoulder Right  Result Date: 04/22/2021 CLINICAL DATA:  Pain post fall EXAM: RIGHT SHOULDER - 2+ VIEW COMPARISON:  None. FINDINGS: Anterior right shoulder dislocation. Small cortical fragments project at the posterior margin of the glenoid. Proximal humerus appears intact. IMPRESSION: Anterior shoulder dislocation Electronically Signed   By: Corlis Leak M.D.   On: 04/22/2021 10:13   CT Head Wo Contrast  Result Date:  04/22/2021 CLINICAL DATA:  Larey Seat, pain EXAM: CT HEAD WITHOUT CONTRAST TECHNIQUE: Contiguous axial images were obtained from the base of the skull through the vertex without intravenous contrast. COMPARISON:  02/07/2012 FINDINGS: Brain: Mild parenchymal atrophy. Patchy areas of hypoattenuation in deep and periventricular white matter bilaterally. Negative for acute intracranial hemorrhage, mass lesion, acute infarction, midline shift, or mass-effect. Acute infarct may be inapparent on noncontrast CT. Ventricles and sulci symmetric. Vascular: No hyperdense vessel or unexpected calcification. Skull: Normal. Negative for fracture or focal lesion. Sinuses/Orbits: No acute finding. Other: None IMPRESSION: 1. Negative for bleed or other acute intracranial process. 2. Atrophy and nonspecific white matter changes. Electronically Signed   By: Corlis Leak M.D.   On: 04/22/2021 10:09   CT CHEST WO CONTRAST  Result Date: 04/22/2021 CLINICAL DATA:  Dyspnea, chronic, unclear etiology Respiratory illness, nondiagnostic xray EXAM: CT CHEST WITHOUT CONTRAST TECHNIQUE: Multidetector CT imaging of the chest was performed following the standard protocol without IV contrast. COMPARISON:  CT chest abdomen pelvis 04/11/2014 FINDINGS: Cardiovascular: Normal heart size. Trace pericardial effusion. The thoracic aorta is normal in caliber. At least mild atherosclerotic plaque of the thoracic aorta. At least 2 vessel coronary artery calcifications. Question enlarged main pulmonary artery. Mediastinum/Nodes: No gross hilar adenopathy, noting limited sensitivity for the detection of hilar adenopathy on this noncontrast study. No enlarged mediastinal or axillary lymph nodes. Thyroid gland, trachea, and esophagus demonstrate no significant findings. Lungs/Pleura: Bilateral dependent posterior subsegmental atelectasis. Bilateral lower lobe base linear atelectasis versus scarring. No focal consolidation. Few scattered calcified and noncalcified  pulmonary micronodules. No pulmonary mass. No pleural effusion. No pneumothorax. Upper Abdomen: There is a 1.6 cm fluid density lesion at the superior pole of the left kidney likely represents a simple renal cyst. Otherwise no acute abnormality. Musculoskeletal: No chest  wall abnormality. No suspicious lytic or blastic osseous lesions. No acute displaced fracture. Multilevel degenerative changes of the spine. Old posterior left third rib fracture. IMPRESSION: 1. No acute intrathoracic abnormality with limited evaluation on this noncontrast study. 2. Question enlarged main pulmonary artery suggestive of pulmonary hypertension. 3. Aortic Atherosclerosis (ICD10-I70.0) including coronary calcifications. Electronically Signed   By: Tish Frederickson M.D.   On: 04/22/2021 20:52   US RENAL  Result Date: 04/22/2021 CLINICAL DATA:  Dyspnea.  Chronic stage III A renal disease. EXAM: RENAL / URINARY TRACT ULTRASOUND COMPLETE COMPARISON:  None. FINDINGS: Right Kidney: Renal measurements: 13.2 x 6.5 x 6.5 cm = volume: 292 mL. Echogenicity within normal limits. No mass or hydronephrosis visualized. Left Kidney: Limited evaluation. Renal measurements: 14.7 x 6.8 x 8.1 cm= volume: 423 mL. Echogenicity within normal limits. There is a 3.7 x 3.2 x 3.7 cm cystic lesion within the left kidney. No solid mass or hydronephrosis visualized. Urinary bladder: Appears normal for degree of bladder distention. Other: None. IMPRESSION: Grossly unremarkable renal ultrasound with a 3.7 cm left simple renal cyst. Please note otherwise limited evaluation of the left kidney. Electronically Signed   By: Tish Frederickson M.D.   On: 04/22/2021 20:05   US Venous Img Lower Bilateral (DVT)  Result Date: 04/23/2021 CLINICAL DATA:  Chronic lower extremity pain and edema EXAM: BILATERAL LOWER EXTREMITY VENOUS DOPPLER ULTRASOUND TECHNIQUE: Gray-scale sonography with graded compression, as well as color Doppler and duplex ultrasound were performed to  evaluate the lower extremity deep venous systems from the level of the common femoral vein and including the common femoral, femoral, profunda femoral, popliteal and calf veins including the posterior tibial, peroneal and gastrocnemius veins when visible. The superficial great saphenous vein was also interrogated. Spectral Doppler was utilized to evaluate flow at rest and with distal augmentation maneuvers in the common femoral, femoral and popliteal veins. COMPARISON:  None. FINDINGS: RIGHT LOWER EXTREMITY Common Femoral Vein: No evidence of thrombus. Normal compressibility, respiratory phasicity and response to augmentation. Saphenofemoral Junction: No evidence of thrombus. Normal compressibility and flow on color Doppler imaging. Profunda Femoral Vein: No evidence of thrombus. Normal compressibility and flow on color Doppler imaging. Femoral Vein: No evidence of thrombus. Normal compressibility, respiratory phasicity and response to augmentation. Popliteal Vein: No evidence of thrombus. Normal compressibility, respiratory phasicity and response to augmentation. Calf Veins: Limited visualization because of body habitus. Tibial peroneal veins appear grossly patent and compressible. LEFT LOWER EXTREMITY Common Femoral Vein: No evidence of thrombus. Normal compressibility, respiratory phasicity and response to augmentation. Saphenofemoral Junction: No evidence of thrombus. Normal compressibility and flow on color Doppler imaging. Profunda Femoral Vein: No evidence of thrombus. Normal compressibility and flow on color Doppler imaging. Femoral Vein: No evidence of thrombus. Normal compressibility, respiratory phasicity and response to augmentation. Popliteal Vein: No evidence of thrombus. Normal compressibility, respiratory phasicity and response to augmentation. Calf Veins: Again limited because of body habitus. Posterior tibial vein appears patent. Peroneal vein not visualized. IMPRESSION: No significant  femoropopliteal DVT in either extremity. Limited assessment of the calf veins because of body habitus. Electronically Signed   By: Judie Petit.  Shick M.D.   On: 04/23/2021 08:02   DG CHEST PORT 1 VIEW  Result Date: 04/23/2021 CLINICAL DATA:  Shortness of breath.  Acute respiratory failure. EXAM: PORTABLE CHEST 1 VIEW COMPARISON:  April 22, 2021 FINDINGS: Stable cardiomegaly. The hila and mediastinum are not significantly changed given the low volume rotated portable technique. Increased interstitial markings bilaterally. Mild opacity in the bases,  likely atelectasis. No other acute abnormalities. IMPRESSION: Findings are favored to represent cardiomegaly with pulmonary venous congestion/mild edema and bibasilar opacities, likely atelectasis. Electronically Signed   By: Gerome Sam III M.D   On: 04/23/2021 11:29   DG Chest Port 1 View  Result Date: 04/15/2021 CLINICAL DATA:  sob EXAM: PORTABLE CHEST 1 VIEW COMPARISON:  April 16, 2014 FINDINGS: The cardiomediastinal silhouette is unchanged and enlarged in contour.Atherosclerotic calcifications. No pleural effusion. No pneumothorax. Perihilar vascular prominence without overt edema. Visualized abdomen is unremarkable. No acute osseous abnormality. IMPRESSION: No acute cardiopulmonary abnormality. Electronically Signed   By: Meda Klinefelter MD   On: 04/15/2021 15:55   DG C-Arm 1-60 Min  Result Date: 04/25/2021 CLINICAL DATA:  Right shoulder reduction. EXAM: DG C-ARM 1-60 MIN; RIGHT SHOULDER - 2+ VIEW FLUOROSCOPY TIME:  Fluoroscopy Time:  11 seconds. Number of Acquired Spot Images: 3 COMPARISON:  04/24/2021. FINDINGS: Three C-arm fluoroscopic images were obtained intraoperatively and submitted for post operative interpretation. These images demonstrate reduction of the right shoulder a which appears to be located but potentially slightly superiorly subluxed. Fracture fragment better characterized on prior radiographs. Please see the performing provider's  procedural report for further detail. IMPRESSION: Intraoperative fluoroscopy, as detailed above. Electronically Signed   By: Feliberto Harts MD   On: 04/25/2021 15:47   DG C-Arm 1-60 Min  Result Date: 04/22/2021 CLINICAL DATA:  Right shoulder dislocation. EXAM: DG C-ARM 1-60 MIN; RIGHT SHOULDER - 2+ VIEW FLUOROSCOPY TIME:  Fluoroscopy Time:  48 seconds. Number of Acquired Spot Images: 3 COMPARISON:  04/22/2021. FINDINGS: Three C-arm fluoroscopic images were obtained intraoperatively and submitted for post operative interpretation. These images demonstrate closed reduction of the right shoulder. The shoulder appears to be located. Please see the performing provider's procedural report for further detail. IMPRESSION: Intraoperative fluoroscopy during shoulder reduction. The right shoulder appears to be located. Post reduction radiographs could confirm and better evaluate for fracture. Electronically Signed   By: Feliberto Harts MD   On: 04/22/2021 16:25   ECHOCARDIOGRAM COMPLETE  Result Date: 04/24/2021    ECHOCARDIOGRAM REPORT   Patient Name:   CHERY GIUSTO Date of Exam: 04/23/2021 Medical Rec #:  [id number]       Height:       68.0 in Accession #:    [id number]      Weight:       346.8 lb Date of Birth:  13-Dec-1965       BSA:          2.582 m Patient Age:    55 years        BP:           160/67 mmHg Patient Gender: F               HR:           94 bpm. Exam Location:  Inpatient Procedure: 2D Echo, Cardiac Doppler and Color Doppler Indications:    Dyspnea R06.00  History:        Patient has prior history of Echocardiogram examinations, most                 recent 04/19/2014. COPD; Risk Factors:Hypertension and Diabetes.                 Parkinson's Disease. Seizures.  Sonographer:    Tiffany Dance Referring Phys: [phone] Ziyanna Tolin  Sonographer Comments: Echo performed with patient supine and on artificial respirator and Technically difficult study due to poor echo  windows. IMPRESSIONS  1. Left ventricular  ejection fraction, by estimation, is 70 to 75%. The left ventricle has hyperdynamic function. The left ventricle has no regional wall motion abnormalities. There is mild left ventricular hypertrophy. Left ventricular diastolic parameters are indeterminate.  2. Right ventricular systolic function is mildly reduced. The right ventricular size is mildly enlarged. Tricuspid regurgitation signal is inadequate for assessing PA pressure.  3. Left atrial size was mildly dilated.  4. Right atrial size was mildly dilated.  5. A small pericardial effusion is present.  6. The mitral valve is degenerative. Trivial mitral valve regurgitation. Mild mitral stenosis. The mean mitral valve gradient is 4.6 mmHg with average heart rate of 95 bpm. Severe mitral annular calcification.  7. The aortic valve is grossly normal. There is mild calcification of the aortic valve. Aortic valve regurgitation is not visualized. No aortic stenosis is present.  8. Increased flow velocities may be secondary to anemia, thyrotoxicosis, hyperdynamic or high flow state. 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 Left ventricular ejection fraction, by estimation, is 70 to 75%. The left ventricle has hyperdynamic function. The left ventricle has no regional wall motion abnormalities.  The left ventricular internal cavity size was normal in size. There is mild left ventricular hypertrophy. Left ventricular diastolic parameters are indeterminate. Right Ventricle: The right ventricular size is mildly enlarged. Right vetricular wall thickness was not well visualized. Right ventricular systolic function is mildly reduced. Tricuspid regurgitation signal is inadequate for assessing PA pressure. Left Atrium: Left atrial size was mildly dilated. Right Atrium: Right atrial size was mildly dilated. Pericardium: A small pericardial effusion is present. Mitral Valve: The mitral valve is degenerative in appearance. Severe  mitral annular calcification. Trivial mitral valve regurgitation. Mild mitral valve stenosis. The mean mitral valve gradient is 4.6 mmHg with average heart rate of 95 bpm. Tricuspid Valve: The tricuspid valve is grossly normal. Tricuspid valve regurgitation is trivial. No evidence of tricuspid stenosis. Aortic Valve: The aortic valve is grossly normal. There is mild calcification of the aortic valve. Aortic valve regurgitation is not visualized. No aortic stenosis is present. Pulmonic Valve: The pulmonic valve was grossly normal. Pulmonic valve regurgitation is trivial. No evidence of pulmonic stenosis. Aorta: The aortic root is normal in size and structure. Venous: The inferior vena cava was not well visualized. IVC assessment for right atrial pressure unable to be performed due to mechanical ventilation. IAS/Shunts: No atrial level shunt detected by color flow Doppler.  LEFT VENTRICLE PLAX 2D LVIDd:         4.04 cm  Diastology LVIDs:         2.71 cm  LV e' medial:    6.22 cm/s LV PW:         1.30 cm  LV E/e' medial:  19.9 LV IVS:        1.11 cm  LV e' lateral:   9.20 cm/s LVOT diam:     1.90 cm  LV E/e' lateral: 13.5 LV SV:         106 LV SV Index:   41 LVOT Area:     2.84 cm  RIGHT VENTRICLE RV Basal diam:  3.98 cm RV Mid diam:    2.75 cm RV S prime:     16.20 cm/s TAPSE (M-mode): 2.0 cm LEFT ATRIUM             Index       RIGHT ATRIUM  Index LA diam:        3.30 cm 1.28 cm/m  RA Area:     18.60 cm LA Vol (A2C):   65.4 ml 25.33 ml/m RA Volume:   55.10 ml  21.34 ml/m LA Vol (A4C):   41.0 ml 15.88 ml/m LA Biplane Vol: 50.1 ml 19.40 ml/m  AORTIC VALVE LVOT Vmax:   181.00 cm/s LVOT Vmean:  124.000 cm/s LVOT VTI:    0.375 m  AORTA Ao Root diam: 3.10 cm Ao Asc diam:  3.50 cm MITRAL VALVE MV Area (PHT): 2.29 cm     SHUNTS MV Area VTI:   3.14 cm     Systemic VTI:  0.38 m MV Mean grad:  4.6 mmHg     Systemic Diam: 1.90 cm MV VTI:        0.34 m MV Decel Time: 331 msec MV E velocity: 124.00 cm/s MV A  velocity: 144.00 cm/s MV E/A ratio:  0.86 Weston Brass MD Electronically signed by Weston Brass MD Signature Date/Time: 04/24/2021/9:10:32 AM    Final     Catarina Hartshorn, DO  [facility]  If 7PM-7AM, please contact night-coverage [email or web address] Password [email or web address] 04/25/2021, 4:21 PM   LOS: 3 days

## 2021-04-25 NOTE — Progress Notes (Signed)
Called and spoke with patient husband with patient's consent and went over with husband [name]) patient needing to go for surgery/procedure for closed reduction right shoulder. Husband gave consent that ok to proceed and another nurse verified over the phone. Patient made aware also and ok with surgery/procedure but unable to sign at this time due to right arm being in sling.

## 2021-04-25 NOTE — Evaluation (Signed)
hysical Therapy Evaluation Patient Details Name: [name] MRN: [id number] DOB: 05-Jun-1966 Today's Date: 04/25/2021   History of Present Illness  [name] is a 55 y.o. female with medical history of current impairment, bipolar disorder, seizure disorder, hypertension, COPD, chronic respiratory failure on 3 L, diabetes mellitus type 2, CKD stage III presenting with mechanical fall on the a.m. of 04/22/2021.  The patient is a difficult historian secondary to cognitive impairment.  History is also supplemented by the patient's spouse.  Unfortunately, he is also a difficult historian.  Nevertheless, it appears that the patient had a mechanical fall getting out of bed on the morning of 04/22/2021.  She has not had a history of frequent falls.  She last fell over 1 year prior to this admission.  There was no loss of consciousness this morning.  After the fall, the patient was complaining of pain all over.  As result, the patient was brought to emergency department for further evaluation.  Notably, the patient was noted to have a fever by EMS of 101.0 F.  She was given acetaminophen by EMS.  Prior to my assessment, the patient was noted to have an increasing oxygen requirement.  She was placed on 6 L nasal cannula.  Her spouse relates that the patient has been having more dyspnea on exertion over the past 3 to 4 weeks.  Apparently, she had a telehealth visit with her PCP a couple weeks prior to this admission.  She was given some type of medication which the spouse and patient do not recall.  There was some mild improvement in her breathing, but she complains of shortness of breath with exertion once again. Pt Rt shoulder was dislocated and recieved a closed reduction on 04/22/2021.  Clinical Impression  At the time of chart review morning notes were not appearing when chart reviewed therefore this therapist was unaware of reinjury.  Pt was very pleasant and eager to begin LE exercises.  Pt was able to  complete all exercises with verbal cuing.  She tends to hold her breath and needs cuing to breath while completing the exercise but does well when instructed.  Pt fatigued after exercises therefore she was assisted by the therapist to obtain comfortable positioning in bed as she was not centered.  Pt agreeable to SNF and will need SNF to improve mobility and strength prior to going home.     Follow Up Recommendations SNF    Equipment Recommendations  None recommended by PT    Recommendations for Other Services       Precautions / Restrictions Precautions Precautions: Shoulder;Fall Type of Shoulder Precautions: dislocation Shoulder Interventions: Shoulder sling/immobilizer Restrictions Weight Bearing Restrictions: Yes RUE Weight Bearing: Non weight bearing      Mobility  Bed Mobility    Min assist to scoot to the center of the bed. No other bed mobility was attempted.                 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    Prior Function Level of Independence: Needs assistance   Gait / Transfers Assistance Needed: normally on 3L O2 and uses a rolling walker           Hand Dominance        Extremity/Trunk Assessment   Upper Extremity Assessment Upper Extremity Assessment: Defer to OT evaluation    Lower Extremity Assessment Lower Extremity Assessment: Generalized weakness        Communication   Communication: No difficulties  Cognition Arousal/Alertness: Awake/alert Behavior During Therapy: WFL for tasks assessed/performed Overall Cognitive Status: Within Functional Limits for tasks assessed                                        General Comments      Exercises General Exercises - Lower Extremity Ankle Circles/Pumps: Both;10 reps Quad Sets: Both;10 reps Gluteal Sets: Both;10 reps Heel Slides: Both;10 reps Hip ABduction/ADduction: Both;10 reps Straight Leg Raises: Both;5 reps   Assessment/Plan    PT Assessment Patient needs continued PT services  PT Problem List Decreased strength;Decreased activity tolerance;Decreased balance;Decreased mobility;Pain;Decreased knowledge of precautions       PT Treatment Interventions Functional mobility training;Therapeutic activities;Therapeutic exercise;Gait training    PT Goals (Current goals can be found in the Care Plan section)       Frequency Min 4X/week   Barriers to discharge        Co-evaluation               AM-PAC PT "6 Clicks" Mobility  Outcome Measure Help needed turning from your back to your side while in a flat bed without using bedrails?: Total Help needed moving from lying on your back to sitting on the side of a flat bed without using bedrails?: Total Help needed moving to and from a bed to a chair (including a wheelchair)?: Total Help needed standing up from a chair using your arms (e.g., wheelchair or bedside chair)?: Total Help needed to walk in hospital room?: Total Help needed climbing 3-5 steps with a railing? : Total 6 Click Score: 6    End of Session   Activity Tolerance: Patient limited by fatigue Patient left: in bed   PT Visit Diagnosis: Muscle weakness (generalized) (M62.81);History of falling (Z91.81);Pain Pain - Right/Left: Right Pain - part of body: Shoulder    Time: 0900-0930 PT Time Calculation (min) (ACUTE ONLY): 30 min   Charges:    PT Evaluation $PT Eval Moderate Complexity: 1 Mod         Virgina Organ, PT CLT [phone]  04/25/2021, 9:56 AM

## 2021-04-25 NOTE — Anesthesia Preprocedure Evaluation (Addendum)
Anesthesia Evaluation  Patient identified by MRN, date of birth, ID band Patient awake    Reviewed: Allergy & Precautions, NPO status , Patient's Chart, lab work & pertinent test results, reviewed documented beta blocker date and time   History of Anesthesia Complications Negative for: history of anesthetic complications  Airway Mallampati: III  TM Distance: >3 FB Neck ROM: Full    Dental  (+) Edentulous Upper, Edentulous Lower   Pulmonary shortness of breath, with exertion, lying and Long-Term Oxygen Therapy, sleep apnea (on BIPAP in hospital) and Oxygen sleep apnea , COPD,  oxygen dependent,    Pulmonary exam normal breath sounds clear to auscultation       Cardiovascular Exercise Tolerance: Poor hypertension, Pt. on medications and Pt. on home beta blockers  Rhythm:Regular Rate:Tachycardia - Systolic murmurs, - Diastolic murmurs, - Friction Rub, - Carotid Bruit and - Peripheral Edema 1. Left ventricular ejection fraction, by estimation, is 70 to 75%. The left ventricle has hyperdynamic function. The left ventricle has no regional wall motion abnormalities. There is mild left ventricular hypertrophy. Left ventricular diastolic parameters are indeterminate.  2. Right ventricular systolic function is mildly reduced. The right ventricular size is mildly enlarged. Tricuspid regurgitation signal is inadequate for assessing PA pressure.  3. Left atrial size was mildly dilated.  4. Right atrial size was mildly dilated.  5. A small pericardial effusion is present.  6. The mitral valve is degenerative. Trivial mitral valve regurgitation.  Mild mitral stenosis. The mean mitral valve gradient is 4.6 mmHg with average heart rate of 95 bpm. Severe mitral annular calcification.  7. The aortic valve is grossly normal. There is mild calcification of the aortic valve. Aortic valve regurgitation is not visualized. No aortic stenosis is present.   8. Increased flow velocities may be secondary to anemia, thyrotoxicosis, hyperdynamic or high flow state.    Neuro/Psych Seizures -,  PSYCHIATRIC DISORDERS Anxiety Bipolar Disorder  Neuromuscular disease (parkinsons disease)    GI/Hepatic negative GI ROS, Neg liver ROS,   Endo/Other  diabetes, Well Controlled, Type 2, Insulin Dependent  Renal/GU Renal InsufficiencyRenal disease     Musculoskeletal  (+) Arthritis ,   Abdominal   Peds  Hematology   Anesthesia Other Findings   Reproductive/Obstetrics                             Anesthesia Physical Anesthesia Plan  ASA: 4  Anesthesia Plan: MAC   Post-op Pain Management:    Induction: Intravenous  PONV Risk Score and Plan: TIVA  Airway Management Planned: Nasal Cannula, Natural Airway and Simple Face Mask  Additional Equipment:   Intra-op Plan:   Post-operative Plan: Possible Post-op intubation/ventilation  Informed Consent: I have reviewed the patients History and Physical, chart, labs and discussed the procedure including the risks, benefits and alternatives for the proposed anesthesia with the patient or authorized representative who has indicated his/her understanding and acceptance.     Dental advisory given  Plan Discussed with: CRNA and Surgeon  Anesthesia Plan Comments:        Anesthesia Quick Evaluation

## 2021-04-25 NOTE — Progress Notes (Signed)
Patient ID: [name], female   DOB: 22-Jul-1966, 55 y.o.   MRN: [id number]  DX: RIGHT SHOULDER RECURRENT DISLOCATION OF THE SHOULDER   [name] is in stable condition   Plan for closed reduction.  Fingers are warm and well perfused.  Sensation intact in the axillary patch.    All questions were answered and she is in agreement with this plan.    [name] A. Dallas Schimke, MD MS [facility] [address] [place],  [place]  [address] Phone: [phone] Fax: [phone]

## 2021-04-25 NOTE — Progress Notes (Signed)
Patient ID: [name], female   DOB: [birth year], 55 y.o.   MRN: [id number]  DX: RIGHT SHOULDER RECURRENT DISLOCATION OF THE SHOULDER   Hope is in stable condition   Plan for closed reduction right shoulder by myself or Dr Dallas Schimke

## 2021-04-25 NOTE — TOC Initial Note (Signed)
Transition of Care [facility]) - Initial/Assessment Note    Patient Details  Name: [name] MRN: [id number] Date of Birth: 09-28-1965  Transition of Care [facility]) CM/SW Contact:    Leitha Bleak, RN Phone Number: 04/25/2021, 1:28 PM  Clinical Narrative:     Patient admitted with acute on chronic respiratory failure, also has a dislocated shoulder. TOC spoke with her son. She lives at home with her husband. She walks and baths without assistance. Her husband assist with cooking and cleaning.  She is able to get into the car and go places as needed. PT is recommending SNF.  Patient has [employer]. TOC to follow for a pair for home health. Son is agreeable to outpatient PT if that is all TOC can set up.               Expected Discharge Plan: Home/Self Care Barriers to Discharge: Continued Medical Work up  Patient Goals and CMS Choice Patient states their goals for this hospitalization and ongoing recovery are:: to get better. CMS Medicare.gov Compare Post Acute Care list provided to:: Patient Represenative (must comment) Choice offered to / list presented to : Adult Children  Expected Discharge Plan and Services Expected Discharge Plan: Home/Self Care      Living arrangements for the past 2 months: Single Family Home     Prior Living Arrangements/Services Living arrangements for the past 2 months: Single Family Home Lives with:: Spouse Patient language and need for interpreter reviewed:: Yes        Need for Family Participation in Patient Care: Yes (Comment) Care giver support system in place?: Yes (comment)   Criminal Activity/Legal Involvement Pertinent to Current Situation/Hospitalization: No - Comment as needed  Activities of Daily Living Home Assistive Devices/Equipment: Wheelchair ADL Screening (condition at time of admission) Patient's cognitive ability adequate to safely complete daily activities?: Yes Is the patient deaf or have difficulty hearing?: No Does the  patient have difficulty seeing, even when wearing glasses/contacts?: No Does the patient have difficulty concentrating, remembering, or making decisions?: No Patient able to express need for assistance with ADLs?: Yes Does the patient have difficulty dressing or bathing?: Yes Independently performs ADLs?: No Communication: Independent Dressing (OT): Dependent Is this a change from baseline?: Pre-admission baseline Grooming: Dependent Is this a change from baseline?: Pre-admission baseline Feeding: Needs assistance Is this a change from baseline?: Pre-admission baseline Bathing: Dependent Is this a change from baseline?: Pre-admission baseline Toileting: Dependent Is this a change from baseline?: Pre-admission baseline In/Out Bed: Dependent Is this a change from baseline?: Pre-admission baseline Walks in Home: Dependent Is this a change from baseline?: Pre-admission baseline Does the patient have difficulty walking or climbing stairs?: Yes Weakness of Legs: Both Weakness of Arms/Hands: Both  Permission Sought/Granted      Permission granted to share info w Relationship: Son    Emotional Assessment      Alcohol / Substance Use: Not Applicable Psych Involvement: No (comment)  Admission diagnosis:  Fall [W19.XXXA] Hypoxia [R09.02] AKI (acute kidney injury) (HCC) [N17.9] Fall, initial encounter [W19.XXXA] Acute on chronic respiratory failure with hypoxia (HCC) [J96.21] Patient Active Problem List   Diagnosis Date Noted   Acute on chronic respiratory failure with hypoxia and hypercapnia (HCC) 04/23/2021   Acute on chronic respiratory failure with hypoxia (HCC) 04/22/2021   Acute renal failure superimposed on stage 3a chronic kidney disease (HCC) 04/22/2021   Fall    Anterior shoulder dislocation, right, initial encounter    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 - [place], [place] - [address] [place] [address] Phone: [phone] Fax: [phone]   Readmission Risk Interventions Readmission Risk Prevention Plan 04/25/2021  Transportation Screening Complete  Home Care Screening Complete  Medication Review (RN CM) Complete  Some recent data might be hidden

## 2021-04-25 NOTE — Transfer of Care (Signed)
Immediate Anesthesia Transfer of Care Note  Patient: [name]  Procedure(s) Performed: CLOSED REDUCTION SHOULDER (Right: Shoulder)  Patient Location: PACU  Anesthesia Type:General  Level of Consciousness: awake, alert  and oriented  Airway & Oxygen Therapy: Patient Spontanous Breathing and Patient connected to face mask oxygen  Post-op Assessment: Report given to RN and Post -op Vital signs reviewed and stable  Post vital signs: Reviewed and stable  Last Vitals:  Vitals Value Taken Time  BP 135/88 04/25/21 1350  Temp    Pulse 104 04/25/21 1351  Resp 14 04/25/21 1350  SpO2 93 % 04/25/21 1351  Vitals shown include unvalidated device data.  Last Pain:  Vitals:   04/25/21 1123  TempSrc: Oral  PainSc: 7       Patients Stated Pain Goal: 6 (04/25/21 1123)  Complications: No notable events documented.

## 2021-04-26 ENCOUNTER — Encounter (HOSPITAL_COMMUNITY): Payer: Self-pay | Admitting: Orthopedic Surgery

## 2021-04-26 DIAGNOSIS — J9621 Acute and chronic respiratory failure with hypoxia: Secondary | ICD-10-CM | POA: Diagnosis not present

## 2021-04-26 DIAGNOSIS — N179 Acute kidney failure, unspecified: Secondary | ICD-10-CM

## 2021-04-26 DIAGNOSIS — S4291XD Fracture of right shoulder girdle, part unspecified, subsequent encounter for fracture with routine healing: Secondary | ICD-10-CM

## 2021-04-26 DIAGNOSIS — S4291XA Fracture of right shoulder girdle, part unspecified, initial encounter for closed fracture: Secondary | ICD-10-CM | POA: Diagnosis not present

## 2021-04-26 DIAGNOSIS — J9622 Acute and chronic respiratory failure with hypercapnia: Secondary | ICD-10-CM | POA: Diagnosis not present

## 2021-04-26 DIAGNOSIS — N189 Chronic kidney disease, unspecified: Secondary | ICD-10-CM

## 2021-04-26 LAB — CBC
HCT: 46.4 % — ABNORMAL HIGH (ref 36.0–46.0)
Hemoglobin: 13.7 g/dL (ref 12.0–15.0)
MCH: 27.4 pg (ref 26.0–34.0)
MCHC: 29.5 g/dL — ABNORMAL LOW (ref 30.0–36.0)
MCV: 92.8 fL (ref 80.0–100.0)
Platelets: 178 10*3/uL (ref 150–400)
RBC: 5 MIL/uL (ref 3.87–5.11)
RDW: 14.9 % (ref 11.5–15.5)
WBC: 5 10*3/uL (ref 4.0–10.5)
nRBC: 0 % (ref 0.0–0.2)

## 2021-04-26 LAB — COMPREHENSIVE METABOLIC PANEL
ALT: 24 U/L (ref 0–44)
AST: 20 U/L (ref 15–41)
Albumin: 2.8 g/dL — ABNORMAL LOW (ref 3.5–5.0)
Alkaline Phosphatase: 74 U/L (ref 38–126)
Anion gap: 9 (ref 5–15)
BUN: 26 mg/dL — ABNORMAL HIGH (ref 6–20)
CO2: 31 mmol/L (ref 22–32)
Calcium: 8.6 mg/dL — ABNORMAL LOW (ref 8.9–10.3)
Chloride: 99 mmol/L (ref 98–111)
Creatinine, Ser: 1.13 mg/dL — ABNORMAL HIGH (ref 0.44–1.00)
GFR, Estimated: 57 mL/min — ABNORMAL LOW (ref >60)
Glucose, Bld: 289 mg/dL — ABNORMAL HIGH (ref 70–99)
Potassium: 4.9 mmol/L (ref 3.5–5.1)
Sodium: 139 mmol/L (ref 135–145)
Total Bilirubin: 1.1 mg/dL (ref 0.3–1.2)
Total Protein: 5.6 g/dL — ABNORMAL LOW (ref 6.5–8.1)

## 2021-04-26 LAB — RESPIRATORY PANEL BY PCR

## 2021-04-26 LAB — GLUCOSE, CAPILLARY
Glucose-Capillary: 262 mg/dL — ABNORMAL HIGH (ref 70–99)
Glucose-Capillary: 304 mg/dL — ABNORMAL HIGH (ref 70–99)
Glucose-Capillary: 345 mg/dL — ABNORMAL HIGH (ref 70–99)
Glucose-Capillary: 405 mg/dL — ABNORMAL HIGH (ref 70–99)

## 2021-04-26 LAB — GLUCOSE, RANDOM: Glucose, Bld: 382 mg/dL — ABNORMAL HIGH (ref 70–99)

## 2021-04-26 LAB — PROCALCITONIN: Procalcitonin: 0.11 ng/mL

## 2021-04-26 LAB — BRAIN NATRIURETIC PEPTIDE: B Natriuretic Peptide: 73 pg/mL (ref 0.0–100.0)

## 2021-04-26 MED ORDER — INSULIN GLARGINE-YFGN 100 UNIT/ML ~~LOC~~ SOLN
35.0000 [IU] | Freq: Every day | SUBCUTANEOUS | Status: DC
  Administered 2021-04-26 – 2021-04-27 (×2): 35 [IU] via SUBCUTANEOUS
  Filled 2021-04-26 (×2): qty 0.35

## 2021-04-26 MED ORDER — IPRATROPIUM-ALBUTEROL 0.5-2.5 (3) MG/3ML IN SOLN
3.0000 mL | Freq: Two times a day (BID) | RESPIRATORY_TRACT | Status: DC
  Administered 2021-04-26 – 2021-04-27 (×3): 3 mL via RESPIRATORY_TRACT
  Filled 2021-04-26 (×4): qty 3

## 2021-04-26 MED ORDER — FUROSEMIDE 10 MG/ML IJ SOLN
40.0000 mg | Freq: Once | INTRAMUSCULAR | Status: AC
  Administered 2021-04-26: 40 mg via INTRAVENOUS
  Filled 2021-04-26: qty 4

## 2021-04-26 NOTE — TOC Progression Note (Signed)
Transition of Care [facility]) - Progression Note    Patient Details  Name: NELI FOFANA MRN: [id number] Date of Birth: 28-Jun-1966  Transition of Care [facility]) CM/SW Contact  Leitha Bleak, RN Phone Number: 04/26/2021, 2:53 PM  Clinical Narrative:   Patient will not qualify for SNF. She is agreeable to home health.  [facility]inda with [facility] accepted the referral for HHPT/RN/SW.  TOC took CPAP aided form to patient for family to assist with getting patient additional help.   Expected Discharge Plan: Home/Self Care Barriers to Discharge: Continued Medical Work up  Expected Discharge Plan and Services Expected Discharge Plan: Home/Self Care    Living arrangements for the past 2 months: Single Family Home         HH Arranged: PT, RN, Social Work [facility] Agency: [facility] Date HH Agency Contacted: 04/26/21 Time HH Agency Contacted: 1453 Representative spoke with at [facility] Agency: Alroy Bailiff   Readmission Risk Interventions Readmission Risk Prevention Plan 04/25/2021  Transportation Screening Complete  Home Care Screening Complete  Medication Review (RN CM) Complete  Some recent data might be hidden

## 2021-04-26 NOTE — Progress Notes (Signed)
Pt on BIPAP for bedtime same setting pt tolerating well RT will continue to monitor

## 2021-04-26 NOTE — Progress Notes (Addendum)
Pt been on 6lpm HFNC for several days spo2 99-100% o2 decreased down to 4lpm cann to try to get patient back down to her baseline o2 of 2lpm cann RT will continue to monitor

## 2021-04-26 NOTE — Progress Notes (Signed)
PROGRESS NOTE  [name]:[id number] DOB: [birth year] DOA: 04/22/2021 PCP: [facility]   Brief History:   55 y.o. female with medical history of current impairment, bipolar disorder, seizure disorder, hypertension, COPD, chronic respiratory failure on 3 L, diabetes mellitus type 2, CKD stage III presenting with mechanical fall on the a.m. of 04/22/2021.  The patient is a difficult historian secondary to cognitive impairment.  History is also supplemented by the patient's spouse.  Unfortunately, he is also a difficult historian.  Nevertheless, it appears that the patient had a mechanical fall getting out of bed on the morning of 04/22/2021.  She has not had a history of frequent falls.  She last fell over 1 year prior to this admission.  There was no loss of consciousness this morning.  After the fall, the patient was complaining of pain all over.  As result, the patient was brought to emergency department for further evaluation.  Notably, the patient was noted to have a fever by EMS of 101.0 F.  She was given acetaminophen by EMS.  Prior to my assessment, the patient was noted to have an increasing oxygen requirement.  She was placed on 6 L nasal cannula.  Her spouse relates that the patient has been having more dyspnea on exertion over the past 3 to 4 weeks.  Apparently, she had a telehealth visit with her PCP a couple weeks prior to this admission.  She was given some type of medication which the spouse and patient do not recall.  There was some mild improvement in her breathing, but she complains of shortness of breath with exertion once again.  She denies any chest pain, headache, nausea, vomiting, diarrhea, abdominal pain.   04/23/21 10AM--I arrive to find patient somnolent but aroused to voice and answered yes/no to simple questions Patient stated she was thirsty but denied sob, cp. Upon checking oxygen saturation myself personally, she was 46%.  I checked  the nasal cannula in the patient's nose and followed the tubing the end of which was on the floor (not connected to the wall mounted oxygen). Then I looked at the wall mounted oxygen device which was connected to oxygen tubing.  I followed the oxygen tubing connected to the wall and it terminated (was connected) to a nebulizer mask which was sitting in a bag hanging on the wall. I connected the patient's nasal cannula tubing from her nose to the oxygen and increased it to 6L.  Over a period of 3-5 min.  Her saturation increased to 91-92%.  I then ordered STAT ABG and CXR and placed orders for transfer to SDU.   On 04/24/21, patient was being bathed and her sling was removed.  The patient complained of pain in her right shoulder again.  Repeat xray showed recurrent anterior dislocation.  Dr. Romeo Apple was notified and she was taken back to OR 04/25/21 to have another closed reduction under conscious sedation.     Assessment/Plan: Acute on chronic respiratory failure with hypoxia and hypercarbia -Obtain ABG 7.281/72/85/28 on 0.5 -Check BNP--606 -likely due to the patient's OSA/OHS and fluid overload -She has never smoked -Chronically on 3 L nasal cannula at home -04/23/21--placed on bipap due to somnolence and hypercarbia -Currently on 6 L -Goal is to keep saturation greater 90% -CT chest--bibasilar atelectasis, no infiltrates or effusions -Echo EF 70-75%, no WMA; mild decrease RV function, mild AS -d/c clonazepam -continue duonebs -wean oxygen for  saturation >90% -8/2 Lasix 40 mg IV x 1   Anterior right shoulder dislocation -Orthopedics, Dr. Romeo Apple consult appreciated -7/29--closed reduction of shoulder dislocation -minimize hypnotic meds -minimize opioids/pain control -04/25/21--repeat closed reduction   Acute on chronic renal failure--CKD 3a -Difficult to clarify whether the patient has a degree of progression of underlying CKD -Previous baseline creatinine 1.0-1.4 -Renal ultrasound--no  hydronephrosis   Fever -Check PCT--0.16 -Lactic acid 1.1 -continues to have fever up to 100.6 -Follow blood cultures--neg to date -Obtain UA and urine culture--neg -7/29 CT chest--no consolidation -obtain CT abd/pelvis--no acute findings -d/c abx and monitor -viral respiratory panel--neg   Uncontrolled diabetes mellitus type 2 with hyperglycemia -Check A1c--8.5 -increase lantus to 35 units -NovoLog sliding scale   Bipolar disorder -Minimize hypnotic medications -Continue Abilify, Cymbalta   Morbid Obesity -BMI 42.27 -lifestyle modification   Leg pain -venous duplex--no DVT   Essential Hypertension -restart inderal and imdur -on IV metoprolol while inderal was held>>restart inderal   Hyperlipidemia -restart statin   Low B12 -supplement po -give IM injection x 1             Status is: Inpatient   Remains inpatient appropriate because:Inpatient level of care appropriate due to severity of illness   Dispo: The patient is from: Home              Anticipated d/c is to: Home              Patient currently is not medically stable to d/c.              Difficult to place patient No                 Family Communication:   sister updated 8/2   Consultants:  none   Code Status:  FULL   DVT Prophylaxis:  [place] Heparin     Procedures: As Listed in Progress Note Above   Antibiotics: Cefepime 7/30>>8/1    Subjective: Patient denies fevers, chills, headache, chest pain, dyspnea, nausea, vomiting, diarrhea, abdominal pain, dysuria, hematuria, hematochezia, and melena.   Objective: Vitals:   04/26/21 0800 04/26/21 0807 04/26/21 0900 04/26/21 1000  BP: (!) 112/56  138/74 (!) 143/53  Pulse: 95  (!) 109   Resp:   (!) 27 18  Temp:  99.5 F (37.5 C)    TempSrc:  Oral    SpO2: 94%  90%   Weight:      Height:        Intake/Output Summary (Last 24 hours) at 04/26/2021 1208 Last data filed at 04/26/2021 1000 Gross per 24 hour  Intake 1480 ml  Output 2450  ml  Net -970 ml   Weight change: 0.9 kg Exam:  General:  Pt is alert, follows commands appropriately, not in acute distress HEENT: No icterus, No thrush, No neck mass, [place]/AT Cardiovascular: RRR, S1/S2, no rubs, no gallops Respiratory: bibasilar rales. No wheeze Abdomen: Soft/+BS, non tender, non distended, no guarding Extremities: No edema, No lymphangitis, No petechiae, No rashes, no synovitis   Data Reviewed: I have personally reviewed following labs and imaging studies Basic Metabolic Panel: Recent Labs  Lab 04/22/21 0855 04/23/21 0430 04/24/21 0444 04/25/21 0435 04/25/21 2149 04/26/21 0355  NA 138 146* 145 140  --  139  K 4.8 4.9 4.5 4.4  --  4.9  CL 105 108 106 99  --  99  CO2 26 30 31 30   --  31  GLUCOSE 286* 126* 73 214* 353* 289*  BUN  60* 42* 28* 26*  --  26*  CREATININE 2.49* 1.71* 1.35* 1.30*  --  1.13*  CALCIUM 8.5* 8.8* 8.8* 8.5*  --  8.6*  MG  --  2.0  --   --   --   --    Liver Function Tests: Recent Labs  Lab 04/22/21 0855 04/23/21 0430 04/26/21 0355  AST 19 22 20   ALT 45* 40 24  ALKPHOS 132* 118 74  BILITOT 0.9 0.9 1.1  PROT 6.0* 6.0* 5.6*  ALBUMIN 3.4* 3.4* 2.8*   No results for input(s): LIPASE, AMYLASE in the last 168 hours. Recent Labs  Lab 04/22/21 1835  AMMONIA 28   Coagulation Profile: Recent Labs  Lab 04/22/21 0855  INR 1.1   CBC: Recent Labs  Lab 04/22/21 0855 04/23/21 0430 04/24/21 0444 04/25/21 0435 04/26/21 0355  WBC 8.1 9.9 6.6 5.8 5.0  NEUTROABS 6.1  --   --   --   --   HGB 15.2* 15.2* 14.3 14.3 13.7  HCT 51.1* 53.8* 48.0* 47.7* 46.4*  MCV 95.7 96.9 95.2 92.8 92.8  PLT 214 231 195 174 178   Cardiac Enzymes: No results for input(s): CKTOTAL, CKMB, CKMBINDEX, TROPONINI in the last 168 hours. BNP: Invalid input(s): POCBNP CBG: Recent Labs  Lab 04/25/21 0816 04/25/21 1136 04/25/21 1719 04/25/21 2051 04/26/21 0801  GLUCAP 202* 190* 213* 415* 262*   HbA1C: No results for input(s): HGBA1C in the last 72  hours. Urine analysis:    Component Value Date/Time   COLORURINE YELLOW (A) 05/01/2017 1852   APPEARANCEUR CLEAR (A) 05/01/2017 1852   LABSPEC 1.023 05/01/2017 1852   PHURINE 5.0 05/01/2017 1852   GLUCOSEU >=500 (A) 05/01/2017 1852   HGBUR NEGATIVE 05/01/2017 1852   BILIRUBINUR NEGATIVE 05/01/2017 1852   KETONESUR NEGATIVE 05/01/2017 1852   PROTEINUR 100 (A) 05/01/2017 1852   UROBILINOGEN 0.2 04/11/2014 1927   NITRITE NEGATIVE 05/01/2017 1852   LEUKOCYTESUR NEGATIVE 05/01/2017 1852   Sepsis Labs: @LABRCNTIP (procalcitonin:4,lacticidven:4) ) Recent Results (from the past 240 hour(s))  Blood culture (routine single)     Status: None (Preliminary result)   Collection Time: 04/22/21  8:55 AM   Specimen: BLOOD  Result Value Ref Range Status   Specimen Description BLOOD RIGHT HAND  Final   Special Requests   Final    BOTTLES DRAWN AEROBIC AND ANAEROBIC Blood Culture results may not be optimal due to an inadequate volume of blood received in culture bottles   Culture   Final    NO GROWTH 3 DAYS Performed at [facility], [address] [place], [place] [address]    Report Status PENDING  Incomplete  Resp Panel by RT-PCR (Flu A&B, Covid) Nasopharyngeal Swab     Status: None   Collection Time: 04/22/21  9:27 AM   Specimen: Nasopharyngeal Swab; Nasopharyngeal(NP) swabs in vial transport medium  Result Value Ref Range Status   SARS Coronavirus 2 by RT PCR NEGATIVE NEGATIVE Final    Comment: (NOTE) SARS-CoV-2 target nucleic acids are NOT DETECTED.  The SARS-CoV-2 RNA is generally detectable in upper respiratory specimens during the acute phase of infection. The lowest concentration of SARS-CoV-2 viral copies this assay can detect is 138 copies/mL. A negative result does not preclude SARS-Cov-2 infection and should not be used as the sole basis for treatment or other patient management decisions. A negative result may occur with  improper specimen collection/handling, submission of  specimen other than nasopharyngeal swab, presence of viral mutation(s) within the areas targeted by this assay,  and inadequate number of viral copies(<138 copies/mL). A negative result must be combined with clinical observations, patient history, and epidemiological information. The expected result is Negative.  Fact Sheet for Patients:  BloggerCourse.com  Fact Sheet for Healthcare Providers:  SeriousBroker.it  This test is no t yet approved or cleared by the [place] FDA and  has been authorized for detection and/or diagnosis of SARS-CoV-2 by FDA under an Emergency Use Authorization (EUA). This EUA will remain  in effect (meaning this test can be used) for the duration of the COVID-19 declaration under Section 564(b)(1) of the Act, 21 U.S.C.section 360bbb-3(b)(1), unless the authorization is terminated  or revoked sooner.       Influenza A by PCR NEGATIVE NEGATIVE Final   Influenza B by PCR NEGATIVE NEGATIVE Final    Comment: (NOTE) The Xpert Xpress SARS-CoV-2/FLU/RSV plus assay is intended as an aid in the diagnosis of influenza from Nasopharyngeal swab specimens and should not be used as a sole basis for treatment. Nasal washings and aspirates are unacceptable for Xpert Xpress SARS-CoV-2/FLU/RSV testing.  Fact Sheet for Patients: BloggerCourse.com  Fact Sheet for Healthcare Providers: SeriousBroker.it  This test is not yet approved or cleared by the [place] FDA and has been authorized for detection and/or diagnosis of SARS-CoV-2 by FDA under an Emergency Use Authorization (EUA). This EUA will remain in effect (meaning this test can be used) for the duration of the COVID-19 declaration under Section 564(b)(1) of the Act, 21 U.S.C. section 360bbb-3(b)(1), unless the authorization is terminated or revoked.  Performed at [facility], [address]  [place], [place] [address]   MRSA Next Gen by PCR, Nasal     Status: None   Collection Time: 04/23/21 11:37 AM   Specimen: Nasal Mucosa; Nasal Swab  Result Value Ref Range Status   MRSA by PCR Next Gen NOT DETECTED NOT DETECTED Final    Comment: (NOTE) The GeneXpert MRSA Assay (FDA approved for NASAL specimens only), is one component of a comprehensive MRSA colonization surveillance program. It is not intended to diagnose MRSA infection nor to guide or monitor treatment for MRSA infections. Test performance is not FDA approved in patients less than 2 years old. Performed at [facility], [address] [place], [place] [address]   Urine Culture     Status: None   Collection Time: 04/23/21  6:05 PM   Specimen: Urine, Clean Catch  Result Value Ref Range Status   Specimen Description   Final    URINE, CLEAN CATCH Performed at [facility], [address], [place], [place] [address]    Special Requests   Final    NONE Performed at [facility], [address] [place], [place] [address]    Culture   Final    NO GROWTH Performed at [facility], [address], [place], [place] [address]    Report Status 04/25/2021 FINAL  Final  Respiratory (~20 pathogens) panel by PCR     Status: None   Collection Time: 04/25/21  8:05 PM   Specimen: Nasopharyngeal Swab; Respiratory  Result Value Ref Range Status   Adenovirus NOT DETECTED NOT DETECTED Final   Coronavirus 229E NOT DETECTED NOT DETECTED Final    Comment: (NOTE) The Coronavirus on the Respiratory Panel, DOES NOT test for the novel  Coronavirus (2019 nCoV)    Coronavirus HKU1 NOT DETECTED NOT DETECTED Final   Coronavirus NL63 NOT DETECTED NOT DETECTED Final   Coronavirus OC43 NOT DETECTED NOT DETECTED Final  Metapneumovirus NOT DETECTED NOT DETECTED Final   Rhinovirus / Enterovirus NOT DETECTED NOT DETECTED Final   Influenza A NOT DETECTED NOT DETECTED Final   Influenza B NOT DETECTED NOT DETECTED Final   Parainfluenza Virus 1  NOT DETECTED NOT DETECTED Final   Parainfluenza Virus 2 NOT DETECTED NOT DETECTED Final   Parainfluenza Virus 3 NOT DETECTED NOT DETECTED Final   Parainfluenza Virus 4 NOT DETECTED NOT DETECTED Final   Respiratory Syncytial Virus NOT DETECTED NOT DETECTED Final   Bordetella pertussis NOT DETECTED NOT DETECTED Final   Bordetella Parapertussis NOT DETECTED NOT DETECTED Final   Chlamydophila pneumoniae NOT DETECTED NOT DETECTED Final   Mycoplasma pneumoniae NOT DETECTED NOT DETECTED Final    Comment: Performed at [facility], [address], [place], [place] [address]     Scheduled Meds:  ARIPiprazole  30 mg Oral Daily   aspirin EC  81 mg Oral q morning   Chlorhexidine Gluconate Cloth  6 each Topical Daily   clotrimazole  10 mg Oral 5 X Daily   docusate sodium  100 mg Oral TID   furosemide  40 mg Intravenous Once   heparin  5,000 Units Subcutaneous Q8H   insulin aspart  0-15 Units Subcutaneous TID WC   insulin aspart  0-5 Units Subcutaneous QHS   insulin glargine-yfgn  35 Units Subcutaneous QHS   ipratropium-albuterol  3 mL Nebulization TID   isosorbide mononitrate  30 mg Oral Daily   metoprolol tartrate  2.5 mg Intravenous Q6H   mirabegron ER  25 mg Oral BID   nystatin   Topical BID   pravastatin  40 mg Oral q1800   vitamin B-12  500 mcg Oral Daily   Continuous Infusions:  Procedures/Studies: CT ABDOMEN PELVIS WO CONTRAST  Result Date: 04/24/2021 CLINICAL DATA:  Abdominal pain and fever. Acute on chronic renal failure. Previous hysterectomy. EXAM: CT ABDOMEN AND PELVIS WITHOUT CONTRAST TECHNIQUE: Multidetector CT imaging of the abdomen and pelvis was performed following the standard protocol without IV contrast. COMPARISON:  04/11/2014 FINDINGS: Lower chest: No acute findings. Hepatobiliary: No mass visualized on this unenhanced exam. Gallbladder is unremarkable. No evidence of biliary ductal dilatation. Pancreas: No mass or inflammatory process visualized on this unenhanced  exam. Spleen:  Within normal limits in size. Adrenals/Urinary tract: No evidence of urolithiasis or hydronephrosis. Increased symmetric bilateral perinephric stranding is noted, and consistent with medical renal disease. Image degradation by motion artifact noted. Several lesions are seen in both kidneys which have increased in size since previous study, but cannot be characterized on this unenhanced exam. Urinary bladder is empty with Foley catheter in place. Stomach/Bowel: No evidence of obstruction, inflammatory process, or abnormal fluid collections. Vascular/Lymphatic: No pathologically enlarged lymph nodes identified. No evidence of abdominal aortic aneurysm. Aortic atherosclerotic calcification noted. Reproductive: Prior hysterectomy noted. Adnexal regions are unremarkable in appearance. Other: A small paraumbilical ventral hernia is again seen which contains only fat. New moderate diffuse body wall edema is also seen. Musculoskeletal:  No suspicious bone lesions identified. IMPRESSION: No evidence of urolithiasis or hydronephrosis. Increased symmetric bilateral perinephric stranding, which is nonspecific but likely secondary to medical renal disease. Increased size of several bilateral renal lesions, which cannot be characterized on this unenhanced exam. Abdomen MRI is recommended for further characterization, preferably without and with contrast if renal function permits. New diffuse body wall edema. Stable small paraumbilical ventral hernia containing only fat. Electronically Signed   By: Danae Orleans M.D.   On: 04/24/2021 19:42  DG Chest 1 View  Result Date: 04/22/2021 CLINICAL DATA:  Fall today. PT c/o pain in pelvis and Rt shoulder/Rt upper chest since fall. Hx COPD, nonsmoker, diabetes, HTN, obesity EXAM: CHEST  1 VIEW COMPARISON:  04/15/2021 FINDINGS: Lungs are clear. Heart size upper limits normal. Aortic Atherosclerosis (ICD10-170.0). No effusion.  No pneumothorax. Right shoulder dislocation  IMPRESSION: No acute cardiopulmonary disease. Right shoulder dislocation Electronically Signed   By: Corlis Leak M.D.   On: 04/22/2021 10:10   DG Pelvis 1-2 Views  Result Date: 04/22/2021 CLINICAL DATA:  Pain post fall EXAM: PELVIS - 1-2 VIEW COMPARISON:  CT 04/11/2014 FINDINGS: There is no evidence of pelvic fracture or diastasis. The right femoral neck is not well profiled. Chronic osteitis pubis. Degenerative changes in the lower lumbar spine. Surgical clips in the left lower quadrant. Bilateral pelvic phleboliths. No pelvic bone lesions are seen. IMPRESSION: No acute findings. Electronically Signed   By: Corlis Leak M.D.   On: 04/22/2021 10:12   DG Shoulder Right  Result Date: 04/25/2021 CLINICAL DATA:  Right shoulder reduction. EXAM: DG C-ARM 1-60 MIN; RIGHT SHOULDER - 2+ VIEW FLUOROSCOPY TIME:  Fluoroscopy Time:  11 seconds. Number of Acquired Spot Images: 3 COMPARISON:  04/24/2021. FINDINGS: Three C-arm fluoroscopic images were obtained intraoperatively and submitted for post operative interpretation. These images demonstrate reduction of the right shoulder a which appears to be located but potentially slightly superiorly subluxed. Fracture fragment better characterized on prior radiographs. Please see the performing provider's procedural report for further detail. IMPRESSION: Intraoperative fluoroscopy, as detailed above. Electronically Signed   By: Feliberto Harts MD   On: 04/25/2021 15:47   DG Shoulder Right  Result Date: 04/24/2021 CLINICAL DATA:  Anterior shoulder dislocation patient reports dislocated right shoulder yesterday with continued pain. EXAM: RIGHT SHOULDER - 2+ VIEW COMPARISON:  Shoulder radiograph 04/22/2021. Procedural fluoroscopy 04/22/2021 during shoulder dislocation reduction. FINDINGS: Recurrent anterior dislocation of the humeral head with respect to the glenoid. Small displaced fracture fragments laterally, donor site may be the lateral humeral head, however fragments also  project adjacent to the glenoid on the [place] view. IMPRESSION: Recurrent anterior shoulder dislocation. Small displaced fracture fragments laterally, donor site may be the lateral humeral head, however fragments also project adjacent to the glenoid on the [place] view. Electronically Signed   By: Narda Rutherford M.D.   On: 04/24/2021 17:41   DG Shoulder Right  Result Date: 04/22/2021 CLINICAL DATA:  Right shoulder dislocation. EXAM: DG C-ARM 1-60 MIN; RIGHT SHOULDER - 2+ VIEW FLUOROSCOPY TIME:  Fluoroscopy Time:  48 seconds. Number of Acquired Spot Images: 3 COMPARISON:  04/22/2021. FINDINGS: Three C-arm fluoroscopic images were obtained intraoperatively and submitted for post operative interpretation. These images demonstrate closed reduction of the right shoulder. The shoulder appears to be located. Please see the performing provider's procedural report for further detail. IMPRESSION: Intraoperative fluoroscopy during shoulder reduction. The right shoulder appears to be located. Post reduction radiographs could confirm and better evaluate for fracture. Electronically Signed   By: Feliberto Harts MD   On: 04/22/2021 16:25   DG Shoulder Right  Result Date: 04/22/2021 CLINICAL DATA:  Pain post fall EXAM: RIGHT SHOULDER - 2+ VIEW COMPARISON:  None. FINDINGS: Anterior right shoulder dislocation. Small cortical fragments project at the posterior margin of the glenoid. Proximal humerus appears intact. IMPRESSION: Anterior shoulder dislocation Electronically Signed   By: Corlis Leak M.D.   On: 04/22/2021 10:13   CT Head Wo Contrast  Result Date: 04/22/2021 CLINICAL DATA:  Larey Seat,  pain EXAM: CT HEAD WITHOUT CONTRAST TECHNIQUE: Contiguous axial images were obtained from the base of the skull through the vertex without intravenous contrast. COMPARISON:  02/07/2012 FINDINGS: Brain: Mild parenchymal atrophy. Patchy areas of hypoattenuation in deep and periventricular white matter bilaterally. Negative for acute  intracranial hemorrhage, mass lesion, acute infarction, midline shift, or mass-effect. Acute infarct may be inapparent on noncontrast CT. Ventricles and sulci symmetric. Vascular: No hyperdense vessel or unexpected calcification. Skull: Normal. Negative for fracture or focal lesion. Sinuses/Orbits: No acute finding. Other: None IMPRESSION: 1. Negative for bleed or other acute intracranial process. 2. Atrophy and nonspecific white matter changes. Electronically Signed   By: Corlis Leak M.D.   On: 04/22/2021 10:09   CT CHEST WO CONTRAST  Result Date: 04/22/2021 CLINICAL DATA:  Dyspnea, chronic, unclear etiology Respiratory illness, nondiagnostic xray EXAM: CT CHEST WITHOUT CONTRAST TECHNIQUE: Multidetector CT imaging of the chest was performed following the standard protocol without IV contrast. COMPARISON:  CT chest abdomen pelvis 04/11/2014 FINDINGS: Cardiovascular: Normal heart size. Trace pericardial effusion. The thoracic aorta is normal in caliber. At least mild atherosclerotic plaque of the thoracic aorta. At least 2 vessel coronary artery calcifications. Question enlarged main pulmonary artery. Mediastinum/Nodes: No gross hilar adenopathy, noting limited sensitivity for the detection of hilar adenopathy on this noncontrast study. No enlarged mediastinal or axillary lymph nodes. Thyroid gland, trachea, and esophagus demonstrate no significant findings. Lungs/Pleura: Bilateral dependent posterior subsegmental atelectasis. Bilateral lower lobe base linear atelectasis versus scarring. No focal consolidation. Few scattered calcified and noncalcified pulmonary micronodules. No pulmonary mass. No pleural effusion. No pneumothorax. Upper Abdomen: There is a 1.6 cm fluid density lesion at the superior pole of the left kidney likely represents a simple renal cyst. Otherwise no acute abnormality. Musculoskeletal: No chest wall abnormality. No suspicious lytic or blastic osseous lesions. No acute displaced fracture.  Multilevel degenerative changes of the spine. Old posterior left third rib fracture. IMPRESSION: 1. No acute intrathoracic abnormality with limited evaluation on this noncontrast study. 2. Question enlarged main pulmonary artery suggestive of pulmonary hypertension. 3. Aortic Atherosclerosis (ICD10-I70.0) including coronary calcifications. Electronically Signed   By: Tish Frederickson M.D.   On: 04/22/2021 20:52   US RENAL  Result Date: 04/22/2021 CLINICAL DATA:  Dyspnea.  Chronic stage III A renal disease. EXAM: RENAL / URINARY TRACT ULTRASOUND COMPLETE COMPARISON:  None. FINDINGS: Right Kidney: Renal measurements: 13.2 x 6.5 x 6.5 cm = volume: 292 mL. Echogenicity within normal limits. No mass or hydronephrosis visualized. Left Kidney: Limited evaluation. Renal measurements: 14.7 x 6.8 x 8.1 cm= volume: 423 mL. Echogenicity within normal limits. There is a 3.7 x 3.2 x 3.7 cm cystic lesion within the left kidney. No solid mass or hydronephrosis visualized. Urinary bladder: Appears normal for degree of bladder distention. Other: None. IMPRESSION: Grossly unremarkable renal ultrasound with a 3.7 cm left simple renal cyst. Please note otherwise limited evaluation of the left kidney. Electronically Signed   By: Tish Frederickson M.D.   On: 04/22/2021 20:05   US Venous Img Lower Bilateral (DVT)  Result Date: 04/23/2021 CLINICAL DATA:  Chronic lower extremity pain and edema EXAM: BILATERAL LOWER EXTREMITY VENOUS DOPPLER ULTRASOUND TECHNIQUE: Gray-scale sonography with graded compression, as well as color Doppler and duplex ultrasound were performed to evaluate the lower extremity deep venous systems from the level of the common femoral vein and including the common femoral, femoral, profunda femoral, popliteal and calf veins including the posterior tibial, peroneal and gastrocnemius veins when visible. The superficial great saphenous vein  was also interrogated. Spectral Doppler was utilized to evaluate flow at rest  and with distal augmentation maneuvers in the common femoral, femoral and popliteal veins. COMPARISON:  None. FINDINGS: RIGHT LOWER EXTREMITY Common Femoral Vein: No evidence of thrombus. Normal compressibility, respiratory phasicity and response to augmentation. Saphenofemoral Junction: No evidence of thrombus. Normal compressibility and flow on color Doppler imaging. Profunda Femoral Vein: No evidence of thrombus. Normal compressibility and flow on color Doppler imaging. Femoral Vein: No evidence of thrombus. Normal compressibility, respiratory phasicity and response to augmentation. Popliteal Vein: No evidence of thrombus. Normal compressibility, respiratory phasicity and response to augmentation. Calf Veins: Limited visualization because of body habitus. Tibial peroneal veins appear grossly patent and compressible. LEFT LOWER EXTREMITY Common Femoral Vein: No evidence of thrombus. Normal compressibility, respiratory phasicity and response to augmentation. Saphenofemoral Junction: No evidence of thrombus. Normal compressibility and flow on color Doppler imaging. Profunda Femoral Vein: No evidence of thrombus. Normal compressibility and flow on color Doppler imaging. Femoral Vein: No evidence of thrombus. Normal compressibility, respiratory phasicity and response to augmentation. Popliteal Vein: No evidence of thrombus. Normal compressibility, respiratory phasicity and response to augmentation. Calf Veins: Again limited because of body habitus. Posterior tibial vein appears patent. Peroneal vein not visualized. IMPRESSION: No significant femoropopliteal DVT in either extremity. Limited assessment of the calf veins because of body habitus. Electronically Signed   By: Judie PetitM.  Shick M.D.   On: 04/23/2021 08:02   DG CHEST PORT 1 VIEW  Result Date: 04/23/2021 CLINICAL DATA:  Shortness of breath.  Acute respiratory failure. EXAM: PORTABLE CHEST 1 VIEW COMPARISON:  April 22, 2021 FINDINGS: Stable cardiomegaly. The hila  and mediastinum are not significantly changed given the low volume rotated portable technique. Increased interstitial markings bilaterally. Mild opacity in the bases, likely atelectasis. No other acute abnormalities. IMPRESSION: Findings are favored to represent cardiomegaly with pulmonary venous congestion/mild edema and bibasilar opacities, likely atelectasis. Electronically Signed   By: Gerome Samavid  Williams III M.D   On: 04/23/2021 11:29   DG Chest Port 1 View  Result Date: 04/15/2021 CLINICAL DATA:  sob EXAM: PORTABLE CHEST 1 VIEW COMPARISON:  April 16, 2014 FINDINGS: The cardiomediastinal silhouette is unchanged and enlarged in contour.Atherosclerotic calcifications. No pleural effusion. No pneumothorax. Perihilar vascular prominence without overt edema. Visualized abdomen is unremarkable. No acute osseous abnormality. IMPRESSION: No acute cardiopulmonary abnormality. Electronically Signed   By: Meda KlinefelterStephanie  Peacock MD   On: 04/15/2021 15:55   DG C-Arm 1-60 Min  Result Date: 04/25/2021 CLINICAL DATA:  Right shoulder reduction. EXAM: DG C-ARM 1-60 MIN; RIGHT SHOULDER - 2+ VIEW FLUOROSCOPY TIME:  Fluoroscopy Time:  11 seconds. Number of Acquired Spot Images: 3 COMPARISON:  04/24/2021. FINDINGS: Three C-arm fluoroscopic images were obtained intraoperatively and submitted for post operative interpretation. These images demonstrate reduction of the right shoulder a which appears to be located but potentially slightly superiorly subluxed. Fracture fragment better characterized on prior radiographs. Please see the performing provider's procedural report for further detail. IMPRESSION: Intraoperative fluoroscopy, as detailed above. Electronically Signed   By: Feliberto HartsFrederick S Jones MD   On: 04/25/2021 15:47   DG C-Arm 1-60 Min  Result Date: 04/22/2021 CLINICAL DATA:  Right shoulder dislocation. EXAM: DG C-ARM 1-60 MIN; RIGHT SHOULDER - 2+ VIEW FLUOROSCOPY TIME:  Fluoroscopy Time:  48 seconds. Number of Acquired Spot  Images: 3 COMPARISON:  04/22/2021. FINDINGS: Three C-arm fluoroscopic images were obtained intraoperatively and submitted for post operative interpretation. These images demonstrate closed reduction of the right shoulder. The shoulder appears to  be located. Please see the performing provider's procedural report for further detail. IMPRESSION: Intraoperative fluoroscopy during shoulder reduction. The right shoulder appears to be located. Post reduction radiographs could confirm and better evaluate for fracture. Electronically Signed   By: Feliberto Harts MD   On: 04/22/2021 16:25   ECHOCARDIOGRAM COMPLETE  Result Date: 04/24/2021    ECHOCARDIOGRAM REPORT   Patient Name:   [name] Date of Exam: 04/23/2021 Medical Rec #:  [id number]       Height:       68.0 in Accession #:    [id number]      Weight:       346.8 lb Date of Birth:  Feb 15, 1966       BSA:          2.582 m Patient Age:    55 years        BP:           160/67 mmHg Patient Gender: F               HR:           94 bpm. Exam Location:  Inpatient Procedure: 2D Echo, Cardiac Doppler and Color Doppler Indications:    Dyspnea R06.00  History:        Patient has prior history of Echocardiogram examinations, most                 recent 04/19/2014. COPD; Risk Factors:Hypertension and Diabetes.                 Parkinson's Disease. Seizures.  Sonographer:    Tiffany Dance Referring Phys: [phone] Montserrat Shek  Sonographer Comments: Echo performed with patient supine and on artificial respirator and Technically difficult study due to poor echo windows. IMPRESSIONS  1. Left ventricular ejection fraction, by estimation, is 70 to 75%. The left ventricle has hyperdynamic function. The left ventricle has no regional wall motion abnormalities. There is mild left ventricular hypertrophy. Left ventricular diastolic parameters are indeterminate.  2. Right ventricular systolic function is mildly reduced. The right ventricular size is mildly enlarged. Tricuspid regurgitation  signal is inadequate for assessing PA pressure.  3. Left atrial size was mildly dilated.  4. Right atrial size was mildly dilated.  5. A small pericardial effusion is present.  6. The mitral valve is degenerative. Trivial mitral valve regurgitation. Mild mitral stenosis. The mean mitral valve gradient is 4.6 mmHg with average heart rate of 95 bpm. Severe mitral annular calcification.  7. The aortic valve is grossly normal. There is mild calcification of the aortic valve. Aortic valve regurgitation is not visualized. No aortic stenosis is present.  8. Increased flow velocities may be secondary to anemia, thyrotoxicosis, hyperdynamic or high flow state. 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 Left ventricular ejection fraction, by estimation, is 70 to 75%. The left ventricle has hyperdynamic function. The left ventricle has no regional wall motion abnormalities.  The left ventricular internal cavity size was normal in size. There is mild left ventricular hypertrophy. Left ventricular diastolic parameters are indeterminate. Right Ventricle: The right ventricular size is mildly enlarged. Right vetricular wall thickness was not well visualized. Right ventricular systolic function is mildly reduced. Tricuspid regurgitation signal is inadequate for assessing PA pressure. Left Atrium: Left atrial size was mildly dilated. Right Atrium: Right atrial size was mildly dilated. Pericardium: A small pericardial effusion is present. Mitral Valve: The mitral valve is degenerative in appearance. Severe mitral  annular calcification. Trivial mitral valve regurgitation. Mild mitral valve stenosis. The mean mitral valve gradient is 4.6 mmHg with average heart rate of 95 bpm. Tricuspid Valve: The tricuspid valve is grossly normal. Tricuspid valve regurgitation is trivial. No evidence of tricuspid stenosis. Aortic Valve: The aortic valve is grossly normal. There is mild calcification of  the aortic valve. Aortic valve regurgitation is not visualized. No aortic stenosis is present. Pulmonic Valve: The pulmonic valve was grossly normal. Pulmonic valve regurgitation is trivial. No evidence of pulmonic stenosis. Aorta: The aortic root is normal in size and structure. Venous: The inferior vena cava was not well visualized. IVC assessment for right atrial pressure unable to be performed due to mechanical ventilation. IAS/Shunts: No atrial level shunt detected by color flow Doppler.  LEFT VENTRICLE PLAX 2D LVIDd:         4.04 cm  Diastology LVIDs:         2.71 cm  LV e' medial:    6.22 cm/s LV PW:         1.30 cm  LV E/e' medial:  19.9 LV IVS:        1.11 cm  LV e' lateral:   9.20 cm/s LVOT diam:     1.90 cm  LV E/e' lateral: 13.5 LV SV:         106 LV SV Index:   41 LVOT Area:     2.84 cm  RIGHT VENTRICLE RV Basal diam:  3.98 cm RV Mid diam:    2.75 cm RV S prime:     16.20 cm/s TAPSE (M-mode): 2.0 cm LEFT ATRIUM             Index       RIGHT ATRIUM           Index LA diam:        3.30 cm 1.28 cm/m  RA Area:     18.60 cm LA Vol (A2C):   65.4 ml 25.33 ml/m RA Volume:   55.10 ml  21.34 ml/m LA Vol (A4C):   41.0 ml 15.88 ml/m LA Biplane Vol: 50.1 ml 19.40 ml/m  AORTIC VALVE LVOT Vmax:   181.00 cm/s LVOT Vmean:  124.000 cm/s LVOT VTI:    0.375 m  AORTA Ao Root diam: 3.10 cm Ao Asc diam:  3.50 cm MITRAL VALVE MV Area (PHT): 2.29 cm     SHUNTS MV Area VTI:   3.14 cm     Systemic VTI:  0.38 m MV Mean grad:  4.6 mmHg     Systemic Diam: 1.90 cm MV VTI:        0.34 m MV Decel Time: 331 msec MV E velocity: 124.00 cm/s MV A velocity: 144.00 cm/s MV E/A ratio:  0.86 Weston Brass MD Electronically signed by Weston Brass MD Signature Date/Time: 04/24/2021/9:10:32 AM    Final     Catarina Hartshorn, DO  [facility]  If 7PM-7AM, please contact night-coverage [email or web address] Password [email or web address] 04/26/2021, 12:08 PM   LOS: 4 days

## 2021-04-26 NOTE — Op Note (Signed)
Date April 25, 2021  Operative note  Procedure closed reduction right shoulder  Diagnosis redislocation right shoulder  History 55 year old female fell and sustained a right shoulder fracture dislocation it was reduced and placed into a sling, however she was in the ICU and taking a bath with the nurses and the shoulder redislocated  She was advised to have a second closed reduction procedure  Surgeon was Romeo Apple assisted by Dr. Loretta Plume  Anesthesia was intubation and succinylcholine  Operative findings fracture dislocation of the shoulder anterior dislocation  Procedure the patient was brought to the operating room she had general anesthesia with intubation she was given succinylcholine and then traction countertraction maneuver was performed until the shoulder reduced  Several images were obtained including Grashey views AP views and scapular Y views to confirm reduction which was successful  The patient was extubated and taken to recovery room in stable condition  Recommendation is for sling to stay in place for 2 weeks

## 2021-04-26 NOTE — Progress Notes (Addendum)
Inpatient Diabetes Program Recommendations  AACE/ADA: New Consensus Statement on Inpatient Glycemic Control   Target Ranges:  Prepandial:   less than 140 mg/dL      Peak postprandial:   less than 180 mg/dL (1-2 hours)      Critically ill patients:  140 - 180 mg/dL  Results for ELIZETH, WEINRICH (MRN [id number]) as of 04/26/2021 06:59  Ref. Range 04/26/2021 03:55  Glucose Latest Ref Range: 70 - 99 mg/dL 553 (H)   Results for LASONJA, LAKINS (MRN [id number]) as of 04/26/2021 06:59  Ref. Range 04/25/2021 08:16 04/25/2021 11:36 04/25/2021 17:19 04/25/2021 20:51  Glucose-Capillary Latest Ref Range: 70 - 99 mg/dL 754 (H) 492 (H) 010 (H) 415 (H)  Results for JASDEEP, DEJARNETT (MRN [id number]) as of 04/26/2021 12:02  Ref. Range 04/22/2021 08:55  Hemoglobin A1C Latest Ref Range: 4.8 - 5.6 % 9.5 (H)   Review of Glycemic Control  Diabetes history: DM2 Outpatient Diabetes medications: Lantus 55 units BID,  Victoza 1.8 mg daily in AM Current orders for Inpatient glycemic control: Semglee 30 units QHS, Novolog 0-15 units TID with meals, Novolog 0-5 units QHS  Inpatient Diabetes Program Recommendations:    Insulin: Please consider increasing Semglee to 35 units QHS and ordering Novolog 4 units TID with meals for meal coverage if patient eats at least 50% of meals.  NOTE: Noted patient received one time Decadron 4 mg at 13:38 on 04/25/21 which is contributing to hyperglycemia.  Addendum 04/26/21@12 :18-Diabetes coordinator working from [employer]. Called patient over the phone to discuss DM management. Patient states that her husband does everything for her regarding her diabetes. She states she does not know what medications she is taking and she does not know what her glucose typically ranges. Patient asked that I call her husband to discuss as she can not provide any information. Called patient's husband Riyan Gavina) and spoke with him regarding his wife's DM control. He states that patient's DM is managed by [facility] and that she has been doing telephone visits for a long time. He reports that he gives patient Lantus 55 units in the morning and again at bedtime and Victoza 1.8 mg daily in the morning. He states that he does not give her any Novolog and he is not aware if she is prescribed that medication at all. He states that he no longer checks her glucose but when he use to check it, it was usually in the mid 100's in the mornings. He states that he does not do it any longer because patient would get upset and angry with him when he tried to check her glucose. He states that patient has not been in for blood work at PCP office in a long time because they want to do visits over the phone. Discussed current A1C of 9.5% indicating an average glucose of 226 mg/dl. Patient's husband not aware of what an A1C is. Explained what an A1C is discussed glucose and A1C goals. Explained that patient's glucose really needs to be checked at least 2 times per day. Discussed [employer] and how it works. Asked patient's husband to ask providers at [facility] about prescribing if he was interested in his wife using it for glucose monitoring. He states that patient eats 3 meals a day consistently and she drinks mainly water and juice. Discussed carbohydrates in juice and encouraged to decrease or eliminate juice. He notes that patient stays on the couch most of the day  and she does not want to get up and exercise or walk. Stressed importance of patient getting better DM control to decrease risk of further complications. Encouraged patient to reach out to [facility] and ask for an in-person visit for patient and ask that labs be done every 3-6 months. Patient's husband verbalized understanding of information and states he has no questions related to DM at this time.  Thanks, Orlando Penner, RN, MSN, CDE Diabetes Coordinator Inpatient Diabetes Program [phone] (Team Pager from  8am to 5pm)

## 2021-04-26 NOTE — Discharge Instructions (Signed)
Current A1C 9.5% on 04/22/21 indicating an average glucose of 226 mg/dl.  Goal A1C is 7% or less.   It is important that glucose be monitored at home especially since you take insulin. Please allow your husband to check your glucose at least 2 times per day.   If interested in using FreeStyle Libre (continuous glucose monitoring sensor) please ask providers at [facility] about prescribing it.

## 2021-04-27 ENCOUNTER — Inpatient Hospital Stay (HOSPITAL_COMMUNITY): Payer: Medicaid Other

## 2021-04-27 DIAGNOSIS — N179 Acute kidney failure, unspecified: Secondary | ICD-10-CM | POA: Diagnosis not present

## 2021-04-27 DIAGNOSIS — J9621 Acute and chronic respiratory failure with hypoxia: Secondary | ICD-10-CM | POA: Diagnosis not present

## 2021-04-27 DIAGNOSIS — N1831 Chronic kidney disease, stage 3a: Secondary | ICD-10-CM | POA: Diagnosis not present

## 2021-04-27 DIAGNOSIS — S43014A Anterior dislocation of right humerus, initial encounter: Secondary | ICD-10-CM | POA: Diagnosis not present

## 2021-04-27 LAB — D-DIMER, QUANTITATIVE: D-Dimer, Quant: 1.8 ug/mL-FEU — ABNORMAL HIGH (ref 0.00–0.50)

## 2021-04-27 LAB — BASIC METABOLIC PANEL
Anion gap: 12 (ref 5–15)
BUN: 25 mg/dL — ABNORMAL HIGH (ref 6–20)
CO2: 33 mmol/L — ABNORMAL HIGH (ref 22–32)
Calcium: 8.7 mg/dL — ABNORMAL LOW (ref 8.9–10.3)
Chloride: 93 mmol/L — ABNORMAL LOW (ref 98–111)
Creatinine, Ser: 1.27 mg/dL — ABNORMAL HIGH (ref 0.44–1.00)
GFR, Estimated: 50 mL/min — ABNORMAL LOW (ref >60)
Glucose, Bld: 278 mg/dL — ABNORMAL HIGH (ref 70–99)
Potassium: 4.2 mmol/L (ref 3.5–5.1)
Sodium: 138 mmol/L (ref 135–145)

## 2021-04-27 LAB — CBC
HCT: 45.6 % (ref 36.0–46.0)
Hemoglobin: 13.9 g/dL (ref 12.0–15.0)
MCH: 27.5 pg (ref 26.0–34.0)
MCHC: 30.5 g/dL (ref 30.0–36.0)
MCV: 90.1 fL (ref 80.0–100.0)
Platelets: 173 10*3/uL (ref 150–400)
RBC: 5.06 MIL/uL (ref 3.87–5.11)
RDW: 15.3 % (ref 11.5–15.5)
WBC: 5.4 10*3/uL (ref 4.0–10.5)
nRBC: 0 % (ref 0.0–0.2)

## 2021-04-27 LAB — CULTURE, BLOOD (SINGLE): Culture: NO GROWTH

## 2021-04-27 LAB — GLUCOSE, CAPILLARY
Glucose-Capillary: 269 mg/dL — ABNORMAL HIGH (ref 70–99)
Glucose-Capillary: 375 mg/dL — ABNORMAL HIGH (ref 70–99)
Glucose-Capillary: 348 mg/dL — ABNORMAL HIGH (ref 70–99)
Glucose-Capillary: 354 mg/dL — ABNORMAL HIGH (ref 70–99)

## 2021-04-27 MED ORDER — INSULIN ASPART 100 UNIT/ML IJ SOLN
6.0000 [IU] | Freq: Three times a day (TID) | INTRAMUSCULAR | Status: DC
  Administered 2021-04-27: 6 [IU] via SUBCUTANEOUS

## 2021-04-27 MED ORDER — INSULIN GLARGINE-YFGN 100 UNIT/ML ~~LOC~~ SOLN
15.0000 [IU] | Freq: Every day | SUBCUTANEOUS | Status: DC
  Administered 2021-04-27: 15 [IU] via SUBCUTANEOUS
  Filled 2021-04-27 (×2): qty 0.15

## 2021-04-27 MED ORDER — CLONAZEPAM 0.5 MG PO TABS
0.5000 mg | ORAL_TABLET | Freq: Two times a day (BID) | ORAL | Status: DC
  Administered 2021-04-27 – 2021-05-04 (×15): 0.5 mg via ORAL
  Filled 2021-04-27 (×15): qty 1

## 2021-04-27 MED ORDER — PROPRANOLOL HCL 20 MG PO TABS
60.0000 mg | ORAL_TABLET | Freq: Every day | ORAL | Status: DC
  Administered 2021-04-28 – 2021-05-03 (×6): 60 mg via ORAL
  Filled 2021-04-27 (×8): qty 3

## 2021-04-27 MED ORDER — IOHEXOL 350 MG/ML SOLN
100.0000 mL | Freq: Once | INTRAVENOUS | Status: AC | PRN
  Administered 2021-04-27: 100 mL via INTRAVENOUS

## 2021-04-27 NOTE — TOC Progression Note (Signed)
Transition of Care [facility]) - Progression Note    Patient Details  Name: [name] MRN: [id number] Date of Birth: [birth year]  Transition of Care [facility]) CM/SW Contact  Leitha Bleak, RN Phone Number: 04/27/2021, 2:17 PM  Clinical Narrative:   Patient needs hospital bed and hoyer lift. Called Shelia with [employer], she will get it delivered.   Expected Discharge Plan: Home/Self Care Barriers to Discharge: Continued Medical Work up  Expected Discharge Plan and Services Expected Discharge Plan: Home/Self Care     Living arrangements for the past 2 months: Single Family Home        HH Arranged: PT, RN, Social Work [facility]: Lambert Rhonda W Agency: [facility] Date HH Agency Contacted: 04/26/21 Time HH Agency Contacted: 1453 Representative spoke with at [facility] Agency: Alroy Bailiff   Readmission Risk Interventions Readmission Risk Prevention Plan 04/25/2021  Transportation Screening Complete  Home Care Screening Complete  Medication Review (RN CM) Complete  Some recent data might be hidden

## 2021-04-27 NOTE — Progress Notes (Signed)
Physical Therapy Treatment Patient Details Name: [name] MRN: [id number] DOB: 03-31-1966 Today's Date: 04/27/2021    History of Present Illness [name] is a 55 y.o. female with medical history of current impairment, bipolar disorder, seizure disorder, hypertension, COPD, chronic respiratory failure on 3 L, diabetes mellitus type 2, CKD stage III presenting with mechanical fall on the a.m. of 04/22/2021.  The patient is a difficult historian secondary to cognitive impairment.  History is also supplemented by the patient's spouse.  Unfortunately, he is also a difficult historian.  Nevertheless, it appears that the patient had a mechanical fall getting out of bed on the morning of 04/22/2021.  She has not had a history of frequent falls.  She last fell over 1 year prior to this admission.  There was no loss of consciousness this morning.  After the fall, the patient was complaining of pain all over.  As result, the patient was brought to emergency department for further evaluation.  Notably, the patient was noted to have a fever by EMS of 101.0 F.  She was given acetaminophen by EMS.  Prior to my assessment, the patient was noted to have an increasing oxygen requirement.  She was placed on 6 L nasal cannula.  Her spouse relates that the patient has been having more dyspnea on exertion over the past 3 to 4 weeks.  Apparently, she had a telehealth visit with her PCP a couple weeks prior to this admission.  She was given some type of medication which the spouse and patient do not recall.  There was some mild improvement in her breathing, but she complains of shortness of breath with exertion once again. Pt Rt shoulder was dislocated and recieved a closed reduction on 04/22/2021.    PT Comments    Patient demonstrates slow labored movement for sitting up at bedside with fair good carryover for using LUE to help pull self to sitting after frequent verbal/tactile cueing, able to stand leaning on RW  with LUE, but required repeated attempts before able to transfer to chair mostly due to apprehension and fear of falling.  Patient limited to a few unsteady labored side steps before having to sit due to fatigue.  Patient tolerated sitting up in chair after therapy - RN aware.  Patient will benefit from continued physical therapy in hospital and recommended venue below to increase strength, balance, endurance for safe ADLs and gait.     Follow Up Recommendations  SNF;Supervision for mobility/OOB;Supervision - Intermittent     Equipment Recommendations  Hospital bed;Other (comment) (Wide based quad cane)    Recommendations for Other Services       Precautions / Restrictions Precautions Precautions: Shoulder;Fall Type of Shoulder Precautions: dislocation Shoulder Interventions: Shoulder sling/immobilizer Restrictions Weight Bearing Restrictions: Yes RUE Weight Bearing: Non weight bearing    Mobility  Bed Mobility Overal bed mobility: Needs Assistance Bed Mobility: Supine to Sit     Supine to sit: Mod assist     General bed mobility comments: increased time, labored movement, frequent verbal/tactile cueing due to apprehension    Transfers Overall transfer level: Needs assistance Equipment used: Rolling walker (2 wheeled) Transfers: Sit to/from [employer] Sit to Stand: Min assist Stand pivot transfers: Mod assist       General transfer comment: slow labored movement using LUE to hold onto RW  Ambulation/Gait Ambulation/Gait assistance: Mod assist;Max assist Gait Distance (Feet): 4 Feet Assistive device: Rolling walker (2 wheeled) Gait Pattern/deviations: Decreased step length - right;Decreased step  length - left;Decreased stride length Gait velocity: decreased   General Gait Details: limited to a few slow labored side steps with difficulty advancing BLE due to c/o weakness and extreme fear of falling   Stairs             Wheelchair Mobility     Modified Rankin (Stroke Patients Only)       Balance Overall balance assessment: Needs assistance Sitting-balance support: Feet supported;No upper extremity supported Sitting balance-Leahy Scale: Fair Sitting balance - Comments: seated at EOB   Standing balance support: During functional activity;Single extremity supported Standing balance-Leahy Scale: Poor Standing balance comment: using RW and bilateral hand held assist to support trunk                            Cognition Arousal/Alertness: Awake/alert Behavior During Therapy: WFL for tasks assessed/performed Overall Cognitive Status: Within Functional Limits for tasks assessed                                        Exercises      General Comments        Pertinent Vitals/Pain Pain Assessment: Faces Faces Pain Scale: Hurts little more Pain Location: right shoulder with any pressure/movement Pain Descriptors / Indicators: Sore;Grimacing;Guarding Pain Intervention(s): Limited activity within patient's tolerance;Monitored during session    Home Living                      Prior Function            PT Goals (current goals can now be found in the care plan section) Progress towards PT goals: Progressing toward goals    Frequency    Min 3X/week      PT Plan Current plan remains appropriate    Co-evaluation              AM-PAC PT "6 Clicks" Mobility   Outcome Measure  Help needed turning from your back to your side while in a flat bed without using bedrails?: A Lot Help needed moving from lying on your back to sitting on the side of a flat bed without using bedrails?: A Lot Help needed moving to and from a bed to a chair (including a wheelchair)?: A Lot Help needed standing up from a chair using your arms (e.g., wheelchair or bedside chair)?: A Lot Help needed to walk in hospital room?: A Lot Help needed climbing 3-5 steps with a railing? : Total 6 Click Score:  11    End of Session Equipment Utilized During Treatment: Oxygen Activity Tolerance: Patient tolerated treatment well;Patient limited by fatigue Patient left: in chair;with call bell/phone within reach Nurse Communication: Mobility status PT Visit Diagnosis: Muscle weakness (generalized) (M62.81);History of falling (Z91.81);Pain;Unsteadiness on feet (R26.81) Pain - Right/Left: Right Pain - part of body: Shoulder     Time: 3748-2707 PT Time Calculation (min) (ACUTE ONLY): 30 min  Charges:  $Therapeutic Activity: 23-37 mins                     2:26 PM, 04/27/21 Ocie Bob, MPT Physical Therapist with [facility] [phone] office [phone] mobile phone

## 2021-04-27 NOTE — Progress Notes (Signed)
PROGRESS NOTE  [name] BJS:[id number] DOB: Feb 05, 1966 DOA: 04/22/2021 PCP: [facility]   Brief History:   55 y.o. female with medical history of current impairment, bipolar disorder, seizure disorder, hypertension, COPD, chronic respiratory failure on 3 L, diabetes mellitus type 2, CKD stage III presenting with mechanical fall on the a.m. of 04/22/2021.  The patient is a difficult historian secondary to cognitive impairment.  History is also supplemented by the patient's spouse.  Unfortunately, he is also a difficult historian.  Nevertheless, it appears that the patient had a mechanical fall getting out of bed on the morning of 04/22/2021.  She has not had a history of frequent falls.  She last fell over 1 year prior to this admission.  There was no loss of consciousness this morning.  After the fall, the patient was complaining of pain all over.  As result, the patient was brought to emergency department for further evaluation.  Notably, the patient was noted to have a fever by EMS of 101.0 F.  She was given acetaminophen by EMS.  Prior to my assessment, the patient was noted to have an increasing oxygen requirement.  She was placed on 6 L nasal cannula.  Her spouse relates that the patient has been having more dyspnea on exertion over the past 3 to 4 weeks.  Apparently, she had a telehealth visit with her PCP a couple weeks prior to this admission.  She was given some type of medication which the spouse and patient do not recall.  There was some mild improvement in her breathing, but she complains of shortness of breath with exertion once again.  She denies any chest pain, headache, nausea, vomiting, diarrhea, abdominal pain.   04/23/21 10AM--I arrive to find patient somnolent but aroused to voice and answered yes/no to simple questions Patient stated she was thirsty but denied sob, cp. Upon checking oxygen saturation myself personally, she was 46%.  I checked  the nasal cannula in the patient's nose and followed the tubing the end of which was on the floor (not connected to the wall mounted oxygen). Then I looked at the wall mounted oxygen device which was connected to oxygen tubing.  I followed the oxygen tubing connected to the wall and it terminated (was connected) to a nebulizer mask which was sitting in a bag hanging on the wall. I connected the patient's nasal cannula tubing from her nose to the oxygen and increased it to 6L.  Over a period of 3-5 min.  Her saturation increased to 91-92%.  I then ordered STAT ABG and CXR and placed orders for transfer to SDU.   On 04/24/21, patient was being bathed and her sling was removed.  The patient complained of pain in her right shoulder again.  Repeat xray showed recurrent anterior dislocation.  Dr. Romeo Apple was notified and she was taken back to OR 04/25/21 to have another closed reduction under conscious sedation.     Assessment/Plan: Acute on chronic respiratory failure with hypoxia and hypercarbia -Obtain ABG 7.281/72/85/28 on 0.5 -Check BNP--606, improved to 73 -likely due to the patient's OSA/OHS and fluid overload -She has never smoked -Chronically on 3 L nasal cannula at home -04/23/21--placed on bipap due to somnolence and hypercarbia -Currently on 6 L -Goal is to keep saturation greater 90% -CT chest--bibasilar atelectasis, no infiltrates or effusions -Echo EF 70-75%, no WMA; mild decrease RV function, mild AS -continue duonebs -wean oxygen  for saturation >90% -8/2 Lasix 40 mg IV x 1 -repeat chest xray today -check D dimer   Anterior right shoulder dislocation -Orthopedics, Dr. Romeo Apple consult appreciated -7/29--closed reduction of shoulder dislocation -minimize hypnotic meds -minimize opioids/pain control -04/25/21--repeat closed reduction   Acute on chronic renal failure--CKD 3a -Difficult to clarify whether the patient has a degree of progression of underlying CKD -Previous baseline  creatinine 1.0-1.4 -Renal ultrasound--no hydronephrosis -presented with creatinine of 2.4, now improved to 1.27   Fever -Check PCT--0.16 -Lactic acid 1.1 -Follow blood cultures--neg to date -Obtain UA and urine culture--neg -7/29 CT chest--no consolidation -obtain CT abd/pelvis--no acute findings -d/c abx and monitor -viral respiratory panel--neg -continue to follow for recurrence   Uncontrolled diabetes mellitus type 2 with hyperglycemia -Check A1c--8.5 -increase lantus to 35 units -NovoLog sliding scale   Bipolar disorder -Minimize hypnotic medications -Continue Abilify, Cymbalta   Morbid Obesity -BMI 51.89 -lifestyle modification   Leg pain -venous duplex--no DVT   Essential Hypertension -restart inderal and imdur -on IV metoprolol while inderal was held>>restart inderal   Hyperlipidemia -restart statin   Low B12 -supplement po -give IM injection x 1  Tremor -chronically on propranolol, which will be restarted -restart reduced dose klonopin      Status is: Inpatient   Remains inpatient appropriate because:Inpatient level of care appropriate due to severity of illness   Dispo: The patient is from: Home              Anticipated d/c is to: Home              Patient currently is not medically stable to d/c.              Difficult to place patient No                 Family Communication:   husband updated 8/3   Consultants:  none   Code Status:  FULL   DVT Prophylaxis:  [place] Heparin     Procedures: As Listed in Progress Note Above   Antibiotics: Cefepime 7/30>>8/1    Subjective: Patient feels short of breath on exertion. She feels weak. No cough or wheezing.    Objective: Vitals:   04/27/21 1002 04/27/21 1100 04/27/21 1125 04/27/21 1300  BP:  113/61  124/80  Pulse:   (!) 112   Resp: 20  (!) 26   Temp:   99 F (37.2 C)   TempSrc:   Oral   SpO2:   92% 99%  Weight:      Height:        Intake/Output Summary (Last 24 hours) at  04/27/2021 1409 Last data filed at 04/27/2021 1610 Gross per 24 hour  Intake 240 ml  Output 2550 ml  Net -2310 ml   Weight change:  Exam:  General:  Pt is alert, follows commands appropriately, not in acute distress HEENT: No icterus, No thrush, No neck mass, [place]/AT Cardiovascular: RRR, S1/S2, no rubs, no gallops Respiratory: bibasilar rales. No wheeze Abdomen: Soft/+BS, non tender, non distended, no guarding Extremities: No edema, No lymphangitis, No petechiae, No rashes, no synovitis   Data Reviewed: I have personally reviewed following labs and imaging studies Basic Metabolic Panel: Recent Labs  Lab 04/23/21 0430 04/24/21 0444 04/25/21 0435 04/25/21 2149 04/26/21 0355 04/26/21 2154 04/27/21 0453  NA 146* 145 140  --  139  --  138  K 4.9 4.5 4.4  --  4.9  --  4.2  CL 108 106 99  --  99  --  93*  CO2 30 31 30   --  31  --  33*  GLUCOSE 126* 73 214* 353* 289* 382* 278*  BUN 42* 28* 26*  --  26*  --  25*  CREATININE 1.71* 1.35* 1.30*  --  1.13*  --  1.27*  CALCIUM 8.8* 8.8* 8.5*  --  8.6*  --  8.7*  MG 2.0  --   --   --   --   --   --    Liver Function Tests: Recent Labs  Lab 04/22/21 0855 04/23/21 0430 04/26/21 0355  AST 19 22 20   ALT 45* 40 24  ALKPHOS 132* 118 74  BILITOT 0.9 0.9 1.1  PROT 6.0* 6.0* 5.6*  ALBUMIN 3.4* 3.4* 2.8*   No results for input(s): LIPASE, AMYLASE in the last 168 hours. Recent Labs  Lab 04/22/21 1835  AMMONIA 28   Coagulation Profile: Recent Labs  Lab 04/22/21 0855  INR 1.1   CBC: Recent Labs  Lab 04/22/21 0855 04/23/21 0430 04/24/21 0444 04/25/21 0435 04/26/21 0355 04/27/21 0453  WBC 8.1 9.9 6.6 5.8 5.0 5.4  NEUTROABS 6.1  --   --   --   --   --   HGB 15.2* 15.2* 14.3 14.3 13.7 13.9  HCT 51.1* 53.8* 48.0* 47.7* 46.4* 45.6  MCV 95.7 96.9 95.2 92.8 92.8 90.1  PLT 214 231 195 174 178 173   Cardiac Enzymes: No results for input(s): CKTOTAL, CKMB, CKMBINDEX, TROPONINI in the last 168 hours. BNP: Invalid input(s):  POCBNP CBG: Recent Labs  Lab 04/26/21 1229 04/26/21 1611 04/26/21 2123 04/27/21 0818 04/27/21 1126  GLUCAP 304* 345* 405* 269* 375*   HbA1C: No results for input(s): HGBA1C in the last 72 hours. Urine analysis:    Component Value Date/Time   COLORURINE YELLOW (A) 05/01/2017 1852   APPEARANCEUR CLEAR (A) 05/01/2017 1852   LABSPEC 1.023 05/01/2017 1852   PHURINE 5.0 05/01/2017 1852   GLUCOSEU >=500 (A) 05/01/2017 1852   HGBUR NEGATIVE 05/01/2017 1852   BILIRUBINUR NEGATIVE 05/01/2017 1852   KETONESUR NEGATIVE 05/01/2017 1852   PROTEINUR 100 (A) 05/01/2017 1852   UROBILINOGEN 0.2 04/11/2014 1927   NITRITE NEGATIVE 05/01/2017 1852   LEUKOCYTESUR NEGATIVE 05/01/2017 1852   Sepsis Labs: @LABRCNTIP (procalcitonin:4,lacticidven:4) ) Recent Results (from the past 240 hour(s))  Blood culture (routine single)     Status: None   Collection Time: 04/22/21  8:55 AM   Specimen: BLOOD  Result Value Ref Range Status   Specimen Description BLOOD RIGHT HAND  Final   Special Requests   Final    BOTTLES DRAWN AEROBIC AND ANAEROBIC Blood Culture results may not be optimal due to an inadequate volume of blood received in culture bottles   Culture   Final    NO GROWTH 5 DAYS Performed at [facility], [address] [place], [facility] [address]    Report Status 04/27/2021 FINAL  Final  Resp Panel by RT-PCR (Flu A&B, Covid) Nasopharyngeal Swab     Status: None   Collection Time: 04/22/21  9:27 AM   Specimen: Nasopharyngeal Swab; Nasopharyngeal(NP) swabs in vial transport medium  Result Value Ref Range Status   SARS Coronavirus 2 by RT PCR NEGATIVE NEGATIVE Final    Comment: (NOTE) SARS-CoV-2 target nucleic acids are NOT DETECTED.  The SARS-CoV-2 RNA is generally detectable in upper respiratory specimens during the acute phase of infection. The lowest concentration of SARS-CoV-2 viral copies this assay can detect is 138 copies/mL. A negative result  does not preclude SARS-Cov-2 infection  and should not be used as the sole basis for treatment or other patient management decisions. A negative result may occur with  improper specimen collection/handling, submission of specimen other than nasopharyngeal swab, presence of viral mutation(s) within the areas targeted by this assay, and inadequate number of viral copies(<138 copies/mL). A negative result must be combined with clinical observations, patient history, and epidemiological information. The expected result is Negative.  Fact Sheet for Patients:  BloggerCourse.com  Fact Sheet for Healthcare Providers:  SeriousBroker.it  This test is no t yet approved or cleared by the [place] FDA and  has been authorized for detection and/or diagnosis of SARS-CoV-2 by FDA under an Emergency Use Authorization (EUA). This EUA will remain  in effect (meaning this test can be used) for the duration of the COVID-19 declaration under Section 564(b)(1) of the Act, 21 U.S.C.section 360bbb-3(b)(1), unless the authorization is terminated  or revoked sooner.       Influenza A by PCR NEGATIVE NEGATIVE Final   Influenza B by PCR NEGATIVE NEGATIVE Final    Comment: (NOTE) The Xpert Xpress SARS-CoV-2/FLU/RSV plus assay is intended as an aid in the diagnosis of influenza from Nasopharyngeal swab specimens and should not be used as a sole basis for treatment. Nasal washings and aspirates are unacceptable for Xpert Xpress SARS-CoV-2/FLU/RSV testing.  Fact Sheet for Patients: BloggerCourse.com  Fact Sheet for Healthcare Providers: SeriousBroker.it  This test is not yet approved or cleared by the [place] FDA and has been authorized for detection and/or diagnosis of SARS-CoV-2 by FDA under an Emergency Use Authorization (EUA). This EUA will remain in effect (meaning this test can be used) for the duration of the COVID-19 declaration  under Section 564(b)(1) of the Act, 21 U.S.C. section 360bbb-3(b)(1), unless the authorization is terminated or revoked.  Performed at [facility], [address], [place], [place] [address]   MRSA Next Gen by PCR, Nasal     Status: None   Collection Time: 04/23/21 11:37 AM   Specimen: Nasal Mucosa; Nasal Swab  Result Value Ref Range Status   MRSA by PCR Next Gen NOT DETECTED NOT DETECTED Final    Comment: (NOTE) The GeneXpert MRSA Assay (FDA approved for NASAL specimens only), is one component of a comprehensive MRSA colonization surveillance program. It is not intended to diagnose MRSA infection nor to guide or monitor treatment for MRSA infections. Test performance is not FDA approved in patients less than 49 years old. Performed at [facility], [address] [place], [place] [address]   Urine Culture     Status: None   Collection Time: 04/23/21  6:05 PM   Specimen: Urine, Clean Catch  Result Value Ref Range Status   Specimen Description   Final    URINE, CLEAN CATCH Performed at [facility], [address], [place], [place] [address]    Special Requests   Final    NONE Performed at [facility], [address], [place], [place] [address]    Culture   Final    NO GROWTH Performed at [facility], [address] [place], [place] [address]    Report Status 04/25/2021 FINAL  Final  Respiratory (~20 pathogens) panel by PCR     Status: None   Collection Time: 04/25/21  8:05 PM   Specimen: Nasopharyngeal Swab; Respiratory  Result Value Ref Range Status   Adenovirus NOT DETECTED NOT DETECTED Final   Coronavirus 229E NOT DETECTED NOT DETECTED Final  Comment: (NOTE) The Coronavirus on the Respiratory Panel, DOES NOT test for the novel  Coronavirus (2019 nCoV)    Coronavirus HKU1 NOT DETECTED NOT DETECTED Final   Coronavirus NL63 NOT DETECTED NOT DETECTED Final   Coronavirus OC43 NOT DETECTED NOT DETECTED Final   Metapneumovirus NOT DETECTED NOT DETECTED Final    Rhinovirus / Enterovirus NOT DETECTED NOT DETECTED Final   Influenza A NOT DETECTED NOT DETECTED Final   Influenza B NOT DETECTED NOT DETECTED Final   Parainfluenza Virus 1 NOT DETECTED NOT DETECTED Final   Parainfluenza Virus 2 NOT DETECTED NOT DETECTED Final   Parainfluenza Virus 3 NOT DETECTED NOT DETECTED Final   Parainfluenza Virus 4 NOT DETECTED NOT DETECTED Final   Respiratory Syncytial Virus NOT DETECTED NOT DETECTED Final   Bordetella pertussis NOT DETECTED NOT DETECTED Final   Bordetella Parapertussis NOT DETECTED NOT DETECTED Final   Chlamydophila pneumoniae NOT DETECTED NOT DETECTED Final   Mycoplasma pneumoniae NOT DETECTED NOT DETECTED Final    Comment: Performed at [facility], [address], [place], [place] [address]     Scheduled Meds:  ARIPiprazole  30 mg Oral Daily   aspirin EC  81 mg Oral q morning   Chlorhexidine Gluconate Cloth  6 each Topical Daily   clonazePAM  0.5 mg Oral BID   clotrimazole  10 mg Oral 5 X Daily   docusate sodium  100 mg Oral TID   heparin  5,000 Units Subcutaneous Q8H   insulin aspart  0-15 Units Subcutaneous TID WC   insulin aspart  0-5 Units Subcutaneous QHS   insulin aspart  6 Units Subcutaneous TID WC   insulin glargine-yfgn  15 Units Subcutaneous Daily   insulin glargine-yfgn  35 Units Subcutaneous QHS   ipratropium-albuterol  3 mL Nebulization BID   isosorbide mononitrate  30 mg Oral Daily   mirabegron ER  25 mg Oral BID   nystatin   Topical BID   pravastatin  40 mg Oral q1800   propranolol  60 mg Oral Q1400   vitamin B-12  500 mcg Oral Daily   Continuous Infusions:  Procedures/Studies: CT ABDOMEN PELVIS WO CONTRAST  Result Date: 04/24/2021 CLINICAL DATA:  Abdominal pain and fever. Acute on chronic renal failure. Previous hysterectomy. EXAM: CT ABDOMEN AND PELVIS WITHOUT CONTRAST TECHNIQUE: Multidetector CT imaging of the abdomen and pelvis was performed following the standard protocol without IV contrast. COMPARISON:   04/11/2014 FINDINGS: Lower chest: No acute findings. Hepatobiliary: No mass visualized on this unenhanced exam. Gallbladder is unremarkable. No evidence of biliary ductal dilatation. Pancreas: No mass or inflammatory process visualized on this unenhanced exam. Spleen:  Within normal limits in size. Adrenals/Urinary tract: No evidence of urolithiasis or hydronephrosis. Increased symmetric bilateral perinephric stranding is noted, and consistent with medical renal disease. Image degradation by motion artifact noted. Several lesions are seen in both kidneys which have increased in size since previous study, but cannot be characterized on this unenhanced exam. Urinary bladder is empty with Foley catheter in place. Stomach/Bowel: No evidence of obstruction, inflammatory process, or abnormal fluid collections. Vascular/Lymphatic: No pathologically enlarged lymph nodes identified. No evidence of abdominal aortic aneurysm. Aortic atherosclerotic calcification noted. Reproductive: Prior hysterectomy noted. Adnexal regions are unremarkable in appearance. Other: A small paraumbilical ventral hernia is again seen which contains only fat. New moderate diffuse body wall edema is also seen. Musculoskeletal:  No suspicious bone lesions identified. IMPRESSION: No evidence of urolithiasis or hydronephrosis. Increased symmetric bilateral perinephric stranding, which is nonspecific but  likely secondary to medical renal disease. Increased size of several bilateral renal lesions, which cannot be characterized on this unenhanced exam. Abdomen MRI is recommended for further characterization, preferably without and with contrast if renal function permits. New diffuse body wall edema. Stable small paraumbilical ventral hernia containing only fat. Electronically Signed   By: Danae Orleans M.D.   On: 04/24/2021 19:42   DG Chest 1 View  Result Date: 04/22/2021 CLINICAL DATA:  Fall today. PT c/o pain in pelvis and Rt shoulder/Rt upper chest  since fall. Hx COPD, nonsmoker, diabetes, HTN, obesity EXAM: CHEST  1 VIEW COMPARISON:  04/15/2021 FINDINGS: Lungs are clear. Heart size upper limits normal. Aortic Atherosclerosis (ICD10-170.0). No effusion.  No pneumothorax. Right shoulder dislocation IMPRESSION: No acute cardiopulmonary disease. Right shoulder dislocation Electronically Signed   By: Corlis Leak M.D.   On: 04/22/2021 10:10   DG Pelvis 1-2 Views  Result Date: 04/22/2021 CLINICAL DATA:  Pain post fall EXAM: PELVIS - 1-2 VIEW COMPARISON:  CT 04/11/2014 FINDINGS: There is no evidence of pelvic fracture or diastasis. The right femoral neck is not well profiled. Chronic osteitis pubis. Degenerative changes in the lower lumbar spine. Surgical clips in the left lower quadrant. Bilateral pelvic phleboliths. No pelvic bone lesions are seen. IMPRESSION: No acute findings. Electronically Signed   By: Corlis Leak M.D.   On: 04/22/2021 10:12   DG Shoulder Right  Result Date: 04/25/2021 CLINICAL DATA:  Right shoulder reduction. EXAM: DG C-ARM 1-60 MIN; RIGHT SHOULDER - 2+ VIEW FLUOROSCOPY TIME:  Fluoroscopy Time:  11 seconds. Number of Acquired Spot Images: 3 COMPARISON:  04/24/2021. FINDINGS: Three C-arm fluoroscopic images were obtained intraoperatively and submitted for post operative interpretation. These images demonstrate reduction of the right shoulder a which appears to be located but potentially slightly superiorly subluxed. Fracture fragment better characterized on prior radiographs. Please see the performing provider's procedural report for further detail. IMPRESSION: Intraoperative fluoroscopy, as detailed above. Electronically Signed   By: Feliberto Harts MD   On: 04/25/2021 15:47   DG Shoulder Right  Result Date: 04/24/2021 CLINICAL DATA:  Anterior shoulder dislocation patient reports dislocated right shoulder yesterday with continued pain. EXAM: RIGHT SHOULDER - 2+ VIEW COMPARISON:  Shoulder radiograph 04/22/2021. Procedural fluoroscopy  04/22/2021 during shoulder dislocation reduction. FINDINGS: Recurrent anterior dislocation of the humeral head with respect to the glenoid. Small displaced fracture fragments laterally, donor site may be the lateral humeral head, however fragments also project adjacent to the glenoid on the [place] view. IMPRESSION: Recurrent anterior shoulder dislocation. Small displaced fracture fragments laterally, donor site may be the lateral humeral head, however fragments also project adjacent to the glenoid on the [place] view. Electronically Signed   By: Narda Rutherford M.D.   On: 04/24/2021 17:41   DG Shoulder Right  Result Date: 04/22/2021 CLINICAL DATA:  Right shoulder dislocation. EXAM: DG C-ARM 1-60 MIN; RIGHT SHOULDER - 2+ VIEW FLUOROSCOPY TIME:  Fluoroscopy Time:  48 seconds. Number of Acquired Spot Images: 3 COMPARISON:  04/22/2021. FINDINGS: Three C-arm fluoroscopic images were obtained intraoperatively and submitted for post operative interpretation. These images demonstrate closed reduction of the right shoulder. The shoulder appears to be located. Please see the performing provider's procedural report for further detail. IMPRESSION: Intraoperative fluoroscopy during shoulder reduction. The right shoulder appears to be located. Post reduction radiographs could confirm and better evaluate for fracture. Electronically Signed   By: Feliberto Harts MD   On: 04/22/2021 16:25   DG Shoulder Right  Result Date: 04/22/2021 CLINICAL  DATA:  Pain post fall EXAM: RIGHT SHOULDER - 2+ VIEW COMPARISON:  None. FINDINGS: Anterior right shoulder dislocation. Small cortical fragments project at the posterior margin of the glenoid. Proximal humerus appears intact. IMPRESSION: Anterior shoulder dislocation Electronically Signed   By: Corlis Leak M.D.   On: 04/22/2021 10:13   CT Head Wo Contrast  Result Date: 04/22/2021 CLINICAL DATA:  Larey Seat, pain EXAM: CT HEAD WITHOUT CONTRAST TECHNIQUE: Contiguous axial images were  obtained from the base of the skull through the vertex without intravenous contrast. COMPARISON:  02/07/2012 FINDINGS: Brain: Mild parenchymal atrophy. Patchy areas of hypoattenuation in deep and periventricular white matter bilaterally. Negative for acute intracranial hemorrhage, mass lesion, acute infarction, midline shift, or mass-effect. Acute infarct may be inapparent on noncontrast CT. Ventricles and sulci symmetric. Vascular: No hyperdense vessel or unexpected calcification. Skull: Normal. Negative for fracture or focal lesion. Sinuses/Orbits: No acute finding. Other: None IMPRESSION: 1. Negative for bleed or other acute intracranial process. 2. Atrophy and nonspecific white matter changes. Electronically Signed   By: Corlis Leak M.D.   On: 04/22/2021 10:09   CT CHEST WO CONTRAST  Result Date: 04/22/2021 CLINICAL DATA:  Dyspnea, chronic, unclear etiology Respiratory illness, nondiagnostic xray EXAM: CT CHEST WITHOUT CONTRAST TECHNIQUE: Multidetector CT imaging of the chest was performed following the standard protocol without IV contrast. COMPARISON:  CT chest abdomen pelvis 04/11/2014 FINDINGS: Cardiovascular: Normal heart size. Trace pericardial effusion. The thoracic aorta is normal in caliber. At least mild atherosclerotic plaque of the thoracic aorta. At least 2 vessel coronary artery calcifications. Question enlarged main pulmonary artery. Mediastinum/Nodes: No gross hilar adenopathy, noting limited sensitivity for the detection of hilar adenopathy on this noncontrast study. No enlarged mediastinal or axillary lymph nodes. Thyroid gland, trachea, and esophagus demonstrate no significant findings. Lungs/Pleura: Bilateral dependent posterior subsegmental atelectasis. Bilateral lower lobe base linear atelectasis versus scarring. No focal consolidation. Few scattered calcified and noncalcified pulmonary micronodules. No pulmonary mass. No pleural effusion. No pneumothorax. Upper Abdomen: There is a 1.6  cm fluid density lesion at the superior pole of the left kidney likely represents a simple renal cyst. Otherwise no acute abnormality. Musculoskeletal: No chest wall abnormality. No suspicious lytic or blastic osseous lesions. No acute displaced fracture. Multilevel degenerative changes of the spine. Old posterior left third rib fracture. IMPRESSION: 1. No acute intrathoracic abnormality with limited evaluation on this noncontrast study. 2. Question enlarged main pulmonary artery suggestive of pulmonary hypertension. 3. Aortic Atherosclerosis (ICD10-I70.0) including coronary calcifications. Electronically Signed   By: Tish Frederickson M.D.   On: 04/22/2021 20:52   US RENAL  Result Date: 04/22/2021 CLINICAL DATA:  Dyspnea.  Chronic stage III A renal disease. EXAM: RENAL / URINARY TRACT ULTRASOUND COMPLETE COMPARISON:  None. FINDINGS: Right Kidney: Renal measurements: 13.2 x 6.5 x 6.5 cm = volume: 292 mL. Echogenicity within normal limits. No mass or hydronephrosis visualized. Left Kidney: Limited evaluation. Renal measurements: 14.7 x 6.8 x 8.1 cm= volume: 423 mL. Echogenicity within normal limits. There is a 3.7 x 3.2 x 3.7 cm cystic lesion within the left kidney. No solid mass or hydronephrosis visualized. Urinary bladder: Appears normal for degree of bladder distention. Other: None. IMPRESSION: Grossly unremarkable renal ultrasound with a 3.7 cm left simple renal cyst. Please note otherwise limited evaluation of the left kidney. Electronically Signed   By: Tish Frederickson M.D.   On: 04/22/2021 20:05   US Venous Img Lower Bilateral (DVT)  Result Date: 04/23/2021 CLINICAL DATA:  Chronic lower extremity pain and edema  EXAM: BILATERAL LOWER EXTREMITY VENOUS DOPPLER ULTRASOUND TECHNIQUE: Gray-scale sonography with graded compression, as well as color Doppler and duplex ultrasound were performed to evaluate the lower extremity deep venous systems from the level of the common femoral vein and including the common  femoral, femoral, profunda femoral, popliteal and calf veins including the posterior tibial, peroneal and gastrocnemius veins when visible. The superficial great saphenous vein was also interrogated. Spectral Doppler was utilized to evaluate flow at rest and with distal augmentation maneuvers in the common femoral, femoral and popliteal veins. COMPARISON:  None. FINDINGS: RIGHT LOWER EXTREMITY Common Femoral Vein: No evidence of thrombus. Normal compressibility, respiratory phasicity and response to augmentation. Saphenofemoral Junction: No evidence of thrombus. Normal compressibility and flow on color Doppler imaging. Profunda Femoral Vein: No evidence of thrombus. Normal compressibility and flow on color Doppler imaging. Femoral Vein: No evidence of thrombus. Normal compressibility, respiratory phasicity and response to augmentation. Popliteal Vein: No evidence of thrombus. Normal compressibility, respiratory phasicity and response to augmentation. Calf Veins: Limited visualization because of body habitus. Tibial peroneal veins appear grossly patent and compressible. LEFT LOWER EXTREMITY Common Femoral Vein: No evidence of thrombus. Normal compressibility, respiratory phasicity and response to augmentation. Saphenofemoral Junction: No evidence of thrombus. Normal compressibility and flow on color Doppler imaging. Profunda Femoral Vein: No evidence of thrombus. Normal compressibility and flow on color Doppler imaging. Femoral Vein: No evidence of thrombus. Normal compressibility, respiratory phasicity and response to augmentation. Popliteal Vein: No evidence of thrombus. Normal compressibility, respiratory phasicity and response to augmentation. Calf Veins: Again limited because of body habitus. Posterior tibial vein appears patent. Peroneal vein not visualized. IMPRESSION: No significant femoropopliteal DVT in either extremity. Limited assessment of the calf veins because of body habitus. Electronically Signed    By: Judie Petit.  Shick M.D.   On: 04/23/2021 08:02   DG CHEST PORT 1 VIEW  Result Date: 04/23/2021 CLINICAL DATA:  Shortness of breath.  Acute respiratory failure. EXAM: PORTABLE CHEST 1 VIEW COMPARISON:  April 22, 2021 FINDINGS: Stable cardiomegaly. The hila and mediastinum are not significantly changed given the low volume rotated portable technique. Increased interstitial markings bilaterally. Mild opacity in the bases, likely atelectasis. No other acute abnormalities. IMPRESSION: Findings are favored to represent cardiomegaly with pulmonary venous congestion/mild edema and bibasilar opacities, likely atelectasis. Electronically Signed   By: Gerome Sam III M.D   On: 04/23/2021 11:29   DG Chest Port 1 View  Result Date: 04/15/2021 CLINICAL DATA:  sob EXAM: PORTABLE CHEST 1 VIEW COMPARISON:  April 16, 2014 FINDINGS: The cardiomediastinal silhouette is unchanged and enlarged in contour.Atherosclerotic calcifications. No pleural effusion. No pneumothorax. Perihilar vascular prominence without overt edema. Visualized abdomen is unremarkable. No acute osseous abnormality. IMPRESSION: No acute cardiopulmonary abnormality. Electronically Signed   By: Meda Klinefelter MD   On: 04/15/2021 15:55   DG C-Arm 1-60 Min  Result Date: 04/25/2021 CLINICAL DATA:  Right shoulder reduction. EXAM: DG C-ARM 1-60 MIN; RIGHT SHOULDER - 2+ VIEW FLUOROSCOPY TIME:  Fluoroscopy Time:  11 seconds. Number of Acquired Spot Images: 3 COMPARISON:  04/24/2021. FINDINGS: Three C-arm fluoroscopic images were obtained intraoperatively and submitted for post operative interpretation. These images demonstrate reduction of the right shoulder a which appears to be located but potentially slightly superiorly subluxed. Fracture fragment better characterized on prior radiographs. Please see the performing provider's procedural report for further detail. IMPRESSION: Intraoperative fluoroscopy, as detailed above. Electronically Signed   By: Feliberto Harts MD   On: 04/25/2021 15:47   DG C-Arm  1-60 Min  Result Date: 04/22/2021 CLINICAL DATA:  Right shoulder dislocation. EXAM: DG C-ARM 1-60 MIN; RIGHT SHOULDER - 2+ VIEW FLUOROSCOPY TIME:  Fluoroscopy Time:  48 seconds. Number of Acquired Spot Images: 3 COMPARISON:  04/22/2021. FINDINGS: Three C-arm fluoroscopic images were obtained intraoperatively and submitted for post operative interpretation. These images demonstrate closed reduction of the right shoulder. The shoulder appears to be located. Please see the performing provider's procedural report for further detail. IMPRESSION: Intraoperative fluoroscopy during shoulder reduction. The right shoulder appears to be located. Post reduction radiographs could confirm and better evaluate for fracture. Electronically Signed   By: Feliberto HartsFrederick S Jones MD   On: 04/22/2021 16:25   ECHOCARDIOGRAM COMPLETE  Result Date: 04/24/2021    ECHOCARDIOGRAM REPORT   Patient Name:   Casey MannsJANET F Hobbs Date of Exam: 04/23/2021 Medical Rec #:  [id number]       Height:       68.0 in Accession #:    [id number]      Weight:       346.8 lb Date of Birth:  09-12-1966       BSA:          2.582 m Patient Age:    55 years        BP:           160/67 mmHg Patient Gender: F               HR:           94 bpm. Exam Location:  Inpatient Procedure: 2D Echo, Cardiac Doppler and Color Doppler Indications:    Dyspnea R06.00  History:        Patient has prior history of Echocardiogram examinations, most                 recent 04/19/2014. COPD; Risk Factors:Hypertension and Diabetes.                 Parkinson's Disease. Seizures.  Sonographer:    Tiffany Dance Referring Phys: [phone] DAVID TAT  Sonographer Comments: Echo performed with patient supine and on artificial respirator and Technically difficult study due to poor echo windows. IMPRESSIONS  1. Left ventricular ejection fraction, by estimation, is 70 to 75%. The left ventricle has hyperdynamic function. The left ventricle has no regional wall motion  abnormalities. There is mild left ventricular hypertrophy. Left ventricular diastolic parameters are indeterminate.  2. Right ventricular systolic function is mildly reduced. The right ventricular size is mildly enlarged. Tricuspid regurgitation signal is inadequate for assessing PA pressure.  3. Left atrial size was mildly dilated.  4. Right atrial size was mildly dilated.  5. A small pericardial effusion is present.  6. The mitral valve is degenerative. Trivial mitral valve regurgitation. Mild mitral stenosis. The mean mitral valve gradient is 4.6 mmHg with average heart rate of 95 bpm. Severe mitral annular calcification.  7. The aortic valve is grossly normal. There is mild calcification of the aortic valve. Aortic valve regurgitation is not visualized. No aortic stenosis is present.  8. Increased flow velocities may be secondary to anemia, thyrotoxicosis, hyperdynamic or high flow state. 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 Left ventricular ejection fraction, by estimation, is 70 to 75%. The left ventricle has hyperdynamic function. The left ventricle has no regional wall motion abnormalities.  The left ventricular internal cavity size was normal in size. There is mild left ventricular hypertrophy. Left ventricular diastolic parameters are indeterminate. Right  Ventricle: The right ventricular size is mildly enlarged. Right vetricular wall thickness was not well visualized. Right ventricular systolic function is mildly reduced. Tricuspid regurgitation signal is inadequate for assessing PA pressure. Left Atrium: Left atrial size was mildly dilated. Right Atrium: Right atrial size was mildly dilated. Pericardium: A small pericardial effusion is present. Mitral Valve: The mitral valve is degenerative in appearance. Severe mitral annular calcification. Trivial mitral valve regurgitation. Mild mitral valve stenosis. The mean mitral valve gradient is 4.6 mmHg with  average heart rate of 95 bpm. Tricuspid Valve: The tricuspid valve is grossly normal. Tricuspid valve regurgitation is trivial. No evidence of tricuspid stenosis. Aortic Valve: The aortic valve is grossly normal. There is mild calcification of the aortic valve. Aortic valve regurgitation is not visualized. No aortic stenosis is present. Pulmonic Valve: The pulmonic valve was grossly normal. Pulmonic valve regurgitation is trivial. No evidence of pulmonic stenosis. Aorta: The aortic root is normal in size and structure. Venous: The inferior vena cava was not well visualized. IVC assessment for right atrial pressure unable to be performed due to mechanical ventilation. IAS/Shunts: No atrial level shunt detected by color flow Doppler.  LEFT VENTRICLE PLAX 2D LVIDd:         4.04 cm  Diastology LVIDs:         2.71 cm  LV e' medial:    6.22 cm/s LV PW:         1.30 cm  LV E/e' medial:  19.9 LV IVS:        1.11 cm  LV e' lateral:   9.20 cm/s LVOT diam:     1.90 cm  LV E/e' lateral: 13.5 LV SV:         106 LV SV Index:   41 LVOT Area:     2.84 cm  RIGHT VENTRICLE RV Basal diam:  3.98 cm RV Mid diam:    2.75 cm RV S prime:     16.20 cm/s TAPSE (M-mode): 2.0 cm LEFT ATRIUM             Index       RIGHT ATRIUM           Index LA diam:        3.30 cm 1.28 cm/m  RA Area:     18.60 cm LA Vol (A2C):   65.4 ml 25.33 ml/m RA Volume:   55.10 ml  21.34 ml/m LA Vol (A4C):   41.0 ml 15.88 ml/m LA Biplane Vol: 50.1 ml 19.40 ml/m  AORTIC VALVE LVOT Vmax:   181.00 cm/s LVOT Vmean:  124.000 cm/s LVOT VTI:    0.375 m  AORTA Ao Root diam: 3.10 cm Ao Asc diam:  3.50 cm MITRAL VALVE MV Area (PHT): 2.29 cm     SHUNTS MV Area VTI:   3.14 cm     Systemic VTI:  0.38 m MV Mean grad:  4.6 mmHg     Systemic Diam: 1.90 cm MV VTI:        0.34 m MV Decel Time: 331 msec MV E velocity: 124.00 cm/s MV A velocity: 144.00 cm/s MV E/A ratio:  0.86 Weston Brass MD Electronically signed by Weston Brass MD Signature Date/Time: 04/24/2021/9:10:32 AM     Final     Erick Blinks, MD  [facility]  If 7PM-7AM, please contact night-coverage [email or web address]  04/27/2021, 2:09 PM   LOS: 5 days

## 2021-04-27 NOTE — Progress Notes (Addendum)
Inpatient Diabetes Program Recommendations  AACE/ADA: New Consensus Statement on Inpatient Glycemic Control   Target Ranges:  Prepandial:   less than 140 mg/dL      Peak postprandial:   less than 180 mg/dL (1-2 hours)      Critically ill patients:  140 - 180 mg/dL  Results for [name], [name] (MRN [id number]) as of 04/27/2021 08:21  Ref. Range 04/26/2021 08:01 04/26/2021 12:29 04/26/2021 16:11 04/26/2021 21:23 04/27/2021 08:18  Glucose-Capillary Latest Ref Range: 70 - 99 mg/dL 010 (H) 272 (H) 536 (H) 405 (H) 269 (H)    Review of Glycemic Control  Diabetes history: DM2 Outpatient Diabetes medications: Lantus 55 units BID,  Victoza 1.8 mg daily in AM Current orders for Inpatient glycemic control: Semglee 35 units QHS, Novolog 0-15 units TID with meals, Novolog 0-5 units QHS  Inpatient Diabetes Program Recommendations:    Insulin: Please consider adding Semglee 15 units QAM and ordering Novolog 6 units TID with meals for meal coverage if patient eats at least 50% of meals.   Thanks, Orlando Penner, RN, MSN, CDE Diabetes Coordinator Inpatient Diabetes Program [phone] (Team Pager from 8am to 5pm)

## 2021-04-27 NOTE — Progress Notes (Signed)
Hospital bed-  Patient requires frequent re-positioning of the body in ways that cannot be achieved with an ordinary bed or wedge pillow, to eliminate pain, reduce pressure, and the head of the bed to be elevated more than 30 degrees most of the time due to acute on chronic respiratory failure with hypoxia, , Traumatic closed displaced fracture, obesity.

## 2021-04-28 DIAGNOSIS — N179 Acute kidney failure, unspecified: Secondary | ICD-10-CM | POA: Diagnosis not present

## 2021-04-28 DIAGNOSIS — N1831 Chronic kidney disease, stage 3a: Secondary | ICD-10-CM | POA: Diagnosis not present

## 2021-04-28 DIAGNOSIS — S43014A Anterior dislocation of right humerus, initial encounter: Secondary | ICD-10-CM | POA: Diagnosis not present

## 2021-04-28 DIAGNOSIS — G4730 Sleep apnea, unspecified: Secondary | ICD-10-CM | POA: Diagnosis not present

## 2021-04-28 DIAGNOSIS — J9622 Acute and chronic respiratory failure with hypercapnia: Secondary | ICD-10-CM | POA: Diagnosis not present

## 2021-04-28 DIAGNOSIS — J9811 Atelectasis: Secondary | ICD-10-CM | POA: Diagnosis not present

## 2021-04-28 DIAGNOSIS — J9621 Acute and chronic respiratory failure with hypoxia: Secondary | ICD-10-CM | POA: Diagnosis not present

## 2021-04-28 LAB — RENAL FUNCTION PANEL
Albumin: 2.9 g/dL — ABNORMAL LOW (ref 3.5–5.0)
Anion gap: 11 (ref 5–15)
BUN: 26 mg/dL — ABNORMAL HIGH (ref 6–20)
CO2: 33 mmol/L — ABNORMAL HIGH (ref 22–32)
Calcium: 8.8 mg/dL — ABNORMAL LOW (ref 8.9–10.3)
Chloride: 92 mmol/L — ABNORMAL LOW (ref 98–111)
Creatinine, Ser: 1.39 mg/dL — ABNORMAL HIGH (ref 0.44–1.00)
GFR, Estimated: 45 mL/min — ABNORMAL LOW (ref >60)
Glucose, Bld: 275 mg/dL — ABNORMAL HIGH (ref 70–99)
Phosphorus: 2.6 mg/dL (ref 2.5–4.6)
Potassium: 4 mmol/L (ref 3.5–5.1)
Sodium: 136 mmol/L (ref 135–145)

## 2021-04-28 LAB — GLUCOSE, CAPILLARY
Glucose-Capillary: 274 mg/dL — ABNORMAL HIGH (ref 70–99)
Glucose-Capillary: 351 mg/dL — ABNORMAL HIGH (ref 70–99)
Glucose-Capillary: 291 mg/dL — ABNORMAL HIGH (ref 70–99)
Glucose-Capillary: 352 mg/dL — ABNORMAL HIGH (ref 70–99)

## 2021-04-28 MED ORDER — LEVALBUTEROL HCL 0.63 MG/3ML IN NEBU
0.6300 mg | INHALATION_SOLUTION | Freq: Four times a day (QID) | RESPIRATORY_TRACT | Status: DC
  Filled 2021-04-28: qty 3

## 2021-04-28 MED ORDER — LEVALBUTEROL HCL 0.63 MG/3ML IN NEBU: 0.6300 mg | INHALATION_SOLUTION | Freq: Four times a day (QID) | RESPIRATORY_TRACT | Status: DC | PRN

## 2021-04-28 MED ORDER — INSULIN GLARGINE-YFGN 100 UNIT/ML ~~LOC~~ SOLN
40.0000 [IU] | Freq: Every day | SUBCUTANEOUS | Status: DC
  Administered 2021-04-28 – 2021-05-01 (×4): 40 [IU] via SUBCUTANEOUS
  Filled 2021-04-28 (×5): qty 0.4

## 2021-04-28 MED ORDER — INSULIN ASPART 100 UNIT/ML IJ SOLN
10.0000 [IU] | Freq: Three times a day (TID) | INTRAMUSCULAR | Status: DC
  Administered 2021-04-28 – 2021-04-29 (×4): 10 [IU] via SUBCUTANEOUS

## 2021-04-28 MED ORDER — INSULIN GLARGINE-YFGN 100 UNIT/ML ~~LOC~~ SOLN
25.0000 [IU] | Freq: Every day | SUBCUTANEOUS | Status: DC
  Administered 2021-04-28: 25 [IU] via SUBCUTANEOUS
  Filled 2021-04-28 (×5): qty 0.25

## 2021-04-28 MED ORDER — BUDESONIDE 0.25 MG/2ML IN SUSP
0.2500 mg | Freq: Two times a day (BID) | RESPIRATORY_TRACT | Status: DC
  Administered 2021-04-28 – 2021-05-04 (×12): 0.25 mg via RESPIRATORY_TRACT
  Filled 2021-04-28 (×12): qty 2

## 2021-04-28 NOTE — Progress Notes (Signed)
Inpatient Diabetes Program Recommendations  AACE/ADA: New Consensus Statement on Inpatient Glycemic Control   Target Ranges:  Prepandial:   less than 140 mg/dL      Peak postprandial:   less than 180 mg/dL (1-2 hours)      Critically ill patients:  140 - 180 mg/dL  Results for BRYLEY, CHRISMAN (MRN [id number]) as of 04/28/2021 07:17  Ref. Range 04/28/2021 03:29  Glucose Latest Ref Range: 70 - 99 mg/dL 440 (H)   Results for LUZMARIA, DEVAUX (MRN [id number]) as of 04/28/2021 07:17  Ref. Range 04/27/2021 08:18 04/27/2021 11:26 04/27/2021 16:46 04/27/2021 21:16  Glucose-Capillary Latest Ref Range: 70 - 99 mg/dL 387 (H) 564 (H) 332 (H) 354 (H)   Review of Glycemic Control  Diabetes history: DM2 Outpatient Diabetes medications: Lantus 55 units BID,  Victoza 1.8 mg daily in AM Current orders for Inpatient glycemic control: Semglee 15 units daily, Semglee 35 units QHS, Novolog 0-15 units TID with meals, Novolog 0-5 units QHS, Novolog 6 units TID with meals   Inpatient Diabetes Program Recommendations:     Insulin: Please consider increasing Semglee to 25 units QAM and Semglee 40 units QHS. Also, please consider increasing meal coverage to Novolog 10 units TID with meals if patient eats at least 50% of meals.   Thanks, Orlando Penner, RN, MSN, CDE Diabetes Coordinator Inpatient Diabetes Program [phone] (Team Pager from 8am to 5pm)

## 2021-04-28 NOTE — Consult Note (Signed)
[facility]   Patient name: [name] Admit date: 04/22/2021  DOB: [birth year] LOS: 6  MRN: [id number] Consult date: 04/28/2021  Referring provider: Dr. Kerry Hough, [facility]: Hypoxia    History:  55 yo female tripped on oxygen cord at home and injured her shoulder.  She normally uses 3 liters oxygen.  She was to also have outpt sleep study scheduled.  She never smoked cigarettes and no prior PFT in system.  She developed increased O2 needs while in hospital.  PCCM asked to assist with respiratory management.  She has been tried on Bipap in hospital and feels this helps  She also feels duoneb helps.  No prior history of asthma.  She carries a diagnosis of COPD, but unclear how this was determined.  She is not having cough, chest congestion, or wheezing.  She has not been mobilizing much since she has been in hospital.  Past medical history:  Anxiety, DM type 2, HTN, Bipolar, HLD, Vit D deficiency, Neuropathy  Significant events:  7/29 admit, ortho consulted >> closed reduction Rt shoulder 7/30 start on Bipap at night  8/01 closed reduction Rt shoulder  Studies:  CT chest 04/22/21 >> atelectasis, few scattered calcified micronodules, enlarged PA Doppler legs b/l 04/23/21 >> no DVT Echo 04/23/21 >> EF 70 to 75%, mild LVH, mild RV enlargement CT angio chest 04/27/21 >> atelectasis  Micro:    Lines:     Antibiotics:    Consults:  Orthopedics    Interim history:    Vital signs:  BP (!) 115/57   Pulse (!) 114   Temp 98.9 F (37.2 C) (Oral)   Resp 17   Ht 5\' 8"  (1.727 m)   Wt (!) 154.8 kg   SpO2 96%   BMI 51.89 kg/m   Intake/output:  I/O last 3 completed shifts: In: -  Out: 3325 [Urine:3325]   Physical exam:   General - alert Eyes - pupils reactive ENT - no sinus tenderness, no stridor Cardiac - regular rate/rhythm, no murmur Chest - equal breath sounds b/l, no wheezing or rales Abdomen - soft, non tender,  + bowel sounds Extremities - no cyanosis, clubbing, or edema Skin - no rashes Neuro - normal strength, moves extremities, follows commands Psych - normal mood and behavior  Best practice:   DVT - SQ heparin SUP - Not indicated Nutrition - Carb modified Mobility - PT   Assessment/plan:   Acute on chronic hypoxic and hypercapnic respiratory failure. - likely combination of atelectasis, volume overload, and sleep disordered breathing with obstructive sleep apnea and obesity hypoventilation syndrome - reported history of COPD, but she denies prior tobacco smoking; she does report clinical benefit from nebulizer therapy - continue supplemental oxygen to keep SpO2 90 to 95% - Bipap qhs and prn - bronchial hygiene - mobilize as able - goal even to negative fluid balance - continue pulmicort and prn duoneb - don't think she needs systemic steroids at this time - she will need outpt sleep testing and pulmonary function testing - needs to work on weight reduction  D/w Dr. .  Resolved hospital problems:    Goals of care/Family discussions:  Code status: full code  Labs:   CMP Latest Ref Rng & Units 04/28/2021 04/27/2021 04/26/2021  Glucose 70 - 99 mg/dL 06/26/2021) 573(U) 202(R)  BUN 6 - 20 mg/dL 427(C) 62(B) -  Creatinine 0.44 - 1.00 mg/dL 76(E) 8.31(D) -  Sodium 135 - 145 mmol/L 136 138 -  Potassium 3.5 -  5.1 mmol/L 4.0 4.2 -  Chloride 98 - 111 mmol/L 92(L) 93(L) -  CO2 22 - 32 mmol/L 33(H) 33(H) -  Calcium 8.9 - 10.3 mg/dL 7.5(T) 7.0(Y) -  Total Protein 6.5 - 8.1 g/dL - - -  Total Bilirubin 0.3 - 1.2 mg/dL - - -  Alkaline Phos 38 - 126 U/L - - -  AST 15 - 41 U/L - - -  ALT 0 - 44 U/L - - -    CBC Latest Ref Rng & Units 04/27/2021 04/26/2021 04/25/2021  WBC 4.0 - 10.5 K/uL 5.4 5.0 5.8  Hemoglobin 12.0 - 15.0 g/dL 17.4 94.4 96.7  Hematocrit 36.0 - 46.0 % 45.6 46.4(H) 47.7(H)  Platelets 150 - 400 K/uL 173 178 174    ABG    Component Value Date/Time   PHART 7.281 (L)  04/23/2021 1538   PCO2ART 72.3 (HH) 04/23/2021 1538   PO2ART 85.0 04/23/2021 1538   HCO3 28.0 04/23/2021 1538   TCO2 40 04/15/2014 0941   O2SAT 94.6 04/23/2021 1538    CBG (last 3)  Recent Labs    04/27/21 2116 04/28/21 0758 04/28/21 1221  GLUCAP 354* 274* 351*     Past surgical history:  She  has a past surgical history that includes Abdominal hysterectomy; Oophorectomy; Shoulder Closed Reduction (Right, 04/22/2021); and Shoulder Closed Reduction (Right, 04/25/2021).  Social history:  She  reports that she has never smoked. She has never used smokeless tobacco. She reports that she does not drink alcohol and does not use drugs.   Review of systems:  Reviewed and negative  Family history:  Her family history is not on file.    Medications:   No current facility-administered medications on file prior to encounter.   Current Outpatient Medications on File Prior to Encounter  Medication Sig   ARIPiprazole (ABILIFY) 30 MG tablet Take 30 mg by mouth every morning.     aspirin EC 81 MG tablet Take 81 mg by mouth every morning.     Biotin 10 MG CAPS Take 1 capsule by mouth daily.   Cholecalciferol (VITAMIN D3) 50 MCG (2000 UT) TABS Take 1 tablet by mouth daily.   docusate sodium (COLACE) 100 MG capsule Take 100 mg by mouth 3 (three) times daily.     DULoxetine (CYMBALTA) 60 MG capsule Take 60 mg by mouth 2 (two) times daily as needed (mood).   gabapentin (NEURONTIN) 300 MG capsule Take 1 capsule (300 mg total) by mouth 3 (three) times daily.   hydrOXYzine (ATARAX/VISTARIL) 25 MG tablet Take 25 mg by mouth 3 (three) times daily as needed for anxiety.   insulin aspart (NOVOLOG) 100 UNIT/ML injection CBG < 70: implement hypoglycemia protocol CBG 70 - 120: 0 units CBG 121 - 150: 3 units CBG 151 - 200: 4 units CBG 201 - 250: 7 units CBG 251 - 300: 11 units CBG 301 - 350: 15 units CBG 351 - 400: 20 units   insulin glargine (LANTUS) 100 UNIT/ML injection Inject 0.6 mLs (60 Units  total) into the skin daily.   isosorbide mononitrate (IMDUR) 30 MG 24 hr tablet Take 1 tablet (30 mg total) by mouth daily.   liraglutide (VICTOZA) 18 MG/3ML SOPN Inject 1.8 mg into the skin daily.   lisinopril (ZESTRIL) 2.5 MG tablet Take 2.5 mg by mouth in the morning and at bedtime.   magnesium chloride (SLOW-MAG) 64 MG TBEC SR tablet Take 1 tablet by mouth daily.   mirabegron ER (MYRBETRIQ) 25 MG TB24 tablet Take 1  tablet by mouth 2 (two) times daily.   Multiple Vitamins-Minerals (MULTIVITAMINS THER. W/MINERALS) TABS Take 1 tablet by mouth every morning.   pravastatin (PRAVACHOL) 40 MG tablet Take 40 mg by mouth daily.   propranolol (INDERAL) 60 MG tablet Take 60 mg by mouth daily. On an empty stomach   calcium carbonate (OS-CAL) 600 MG TABS Take 600 mg by mouth 2 (two) times daily with a meal.   (Patient not taking: No sig reported)   clonazePAM (KLONOPIN) 1 MG tablet Take 1 mg by mouth 2 (two) times daily.  (Patient not taking: No sig reported)   nystatin (MYCOSTATIN) 100000 UNIT/ML suspension Take by mouth. (Patient not taking: No sig reported)   predniSONE (DELTASONE) 20 MG tablet 2 tabs po daily x 3 days (Patient not taking: No sig reported)     Signature:  Coralyn Helling, MD [facility] Pager - [phone] 04/28/2021, 3:02 PM

## 2021-04-28 NOTE — Progress Notes (Signed)
PROGRESS NOTE  Casey MannsJanet F Shepherd [name] DOB: [birth year] DOA: 04/22/2021 [facility]   Brief History:   55 y.o. female with medical history of current impairment, bipolar disorder, seizure disorder, hypertension, COPD, chronic respiratory failure on 3 L, diabetes mellitus type 2, CKD stage III presenting with mechanical fall on the a.m. of 04/22/2021.  The patient is a difficult historian secondary to cognitive impairment.  History is also supplemented by the patient's spouse.  Unfortunately, he is also a difficult historian.  Nevertheless, it appears that the patient had a mechanical fall getting out of bed on the morning of 04/22/2021.  She has not had a history of frequent falls.  She last fell over 1 year prior to this admission.  There was no loss of consciousness this morning.  After the fall, the patient was complaining of pain all over.  As result, the patient was brought to emergency department for further evaluation.  Notably, the patient was noted to have a fever by EMS of 101.0 F.  She was given acetaminophen by EMS.  Prior to my assessment, the patient was noted to have an increasing oxygen requirement.  She was placed on 6 L nasal cannula.  Her spouse relates that the patient has been having more dyspnea on exertion over the past 3 to 4 weeks.  Apparently, she had a telehealth visit with her PCP a couple weeks prior to this admission.  She was given some type of medication which the spouse and patient do not recall.  There was some mild improvement in her breathing, but she complains of shortness of breath with exertion once again.  She denies any chest pain, headache, nausea, vomiting, diarrhea, abdominal pain.   04/23/21 10AM--I arrive to find patient somnolent but aroused to voice and answered yes/no to simple questions Patient stated she was thirsty but denied sob, cp. Upon checking oxygen saturation myself personally, she was 46%.  I checked  the nasal cannula in the patient's nose and followed the tubing the end of which was on the floor (not connected to the wall mounted oxygen). Then I looked at the wall mounted oxygen device which was connected to oxygen tubing.  I followed the oxygen tubing connected to the wall and it terminated (was connected) to a nebulizer mask which was sitting in a bag hanging on the wall. I connected the patient's nasal cannula tubing from her nose to the oxygen and increased it to 6L.  Over a period of 3-5 min.  Her saturation increased to 91-92%.  I then ordered STAT ABG and CXR and placed orders for transfer to SDU.   On 04/24/21, patient was being bathed and her sling was removed.  The patient complained of pain in her right shoulder again.  Repeat xray showed recurrent anterior dislocation.  Dr. Romeo AppleHarrison was notified and she was taken back to OR 04/25/21 to have another closed reduction under conscious sedation.     Assessment/Plan: Acute on chronic respiratory failure with hypoxia and hypercarbia -Obtain ABG 7.281/72/85/28 on 0.5 -Check BNP--606, improved to 73 -likely due to the patient's OSA/OHS and fluid overload -She has never smoked -Chronically on 3 L nasal cannula at home -04/23/21--placed on bipap due to somnolence and hypercarbia -Currently on 4 L -Goal is to keep saturation greater 90% -CT chest--bibasilar atelectasis, no infiltrates or effusions -Echo EF 70-75%, no WMA; mild decrease RV function, mild AS -continue duonebs -wean oxygen  for saturation >90% -8/2 Lasix 40 mg IV x 1 -D-dimer mildly elevated -CT angio chest negative for pulmonary embolism -Appreciate pulmonology input -Suspect that she is improving and probably approaching her baseline   Anterior right shoulder dislocation -Orthopedics, Dr. Romeo Apple consult appreciated -7/29--closed reduction of shoulder dislocation -minimize hypnotic meds -minimize opioids/pain control -04/25/21--repeat closed reduction   Acute on  chronic renal failure--CKD 3a -Difficult to clarify whether the patient has a degree of progression of underlying CKD -Previous baseline creatinine 1.0-1.4 -Renal ultrasound--no hydronephrosis -presented with creatinine of 2.4, now improved to 1.27   Fever -Check PCT--0.16 -Lactic acid 1.1 -Follow blood cultures--neg to date -Obtain UA and urine culture--neg -7/29 CT chest--no consolidation -obtain CT abd/pelvis--no acute findings -d/c abx and monitor -viral respiratory panel--neg -continue to follow for recurrence   Uncontrolled diabetes mellitus type 2 with hyperglycemia -Check A1c--8.5 -increase lantus to 40 units at bedtime and 25 units during the day -She is also on NovoLog 10 units 3 times daily with meals -NovoLog sliding scale   Bipolar disorder -Minimize hypnotic medications -Continue Abilify, Cymbalta   Morbid Obesity -BMI 51.89 -lifestyle modification   Leg pain -venous duplex--no DVT   Essential Hypertension -restart inderal and imdur -on IV metoprolol while inderal was held>>restarted inderal   Hyperlipidemia -restart statin   Low B12 -supplement po -give IM injection x 1  Tremor -chronically on propranolol, which will be restarted -restart reduced dose klonopin      Status is: Inpatient   Remains inpatient appropriate because:Inpatient level of care appropriate due to severity of illness   Dispo: The patient is from: Home              Anticipated d/c is to: Home              Patient currently is not medically stable to d/c.              Difficult to place patient No                 Family Communication:   husband updated 8/4   Consultants: Pulmonology   Code Status:  FULL   DVT Prophylaxis:  Rembrandt Heparin     Procedures: As Listed in Progress Note Above   Antibiotics: Cefepime 7/30>>8/1    Subjective: Feels her breathing is improving.  No wheezing.  She does feel better after receiving  bronchodilators.   Objective: Vitals:   04/28/21 1505 04/28/21 1600 04/28/21 1700 04/28/21 1704  BP:  113/64 117/71   Pulse: 95 85 81 83  Resp: 18 20 20  (!) 21  Temp:    98.7 F (37.1 C)  TempSrc:    Oral  SpO2:  98% 100% 97%  Weight:      Height:        Intake/Output Summary (Last 24 hours) at 04/28/2021 2020 Last data filed at 04/28/2021 1545 Gross per 24 hour  Intake --  Output 1775 ml  Net -1775 ml   Weight change:  Exam:  General:  Pt is alert, follows commands appropriately, not in acute distress HEENT: No icterus, No thrush, No neck mass, [place]/AT Cardiovascular: RRR, S1/S2, no rubs, no gallops Respiratory: bibasilar rales. No wheeze Abdomen: Soft/+BS, non tender, non distended, no guarding Extremities: No edema, No lymphangitis, No petechiae, No rashes, no synovitis   Data Reviewed: I have personally reviewed following labs and imaging studies Basic Metabolic Panel: Recent Labs  Lab 04/23/21 0430 04/24/21 0444 04/25/21 0435 04/25/21 2149 04/26/21 0355 04/26/21 2154 04/27/21  0453 04/28/21 0329  NA 146* 145 140  --  139  --  138 136  K 4.9 4.5 4.4  --  4.9  --  4.2 4.0  CL 108 106 99  --  99  --  93* 92*  CO2 --  31  --  33* 33*  GLUCOSE 126* 73 214* 353* 289* 382* 278* 275*  BUN 42* 28* 26*  --  26*  --  25* 26*  CREATININE 1.71* 1.35* 1.30*  --  1.13*  --  1.27* 1.39*  CALCIUM 8.8* 8.8* 8.5*  --  8.6*  --  8.7* 8.8*  MG 2.0  --   --   --   --   --   --   --   PHOS  --   --   --   --   --   --   --  2.6   Liver Function Tests: Recent Labs  Lab 04/22/21 0855 04/23/21 0430 04/26/21 0355 04/28/21 0329  AST --   ALT 45* 40 24  --   ALKPHOS 132* 118 74  --   BILITOT 0.9 0.9 1.1  --   PROT 6.0* 6.0* 5.6*  --   ALBUMIN 3.4* 3.4* 2.8* 2.9*   No results for input(s): LIPASE, AMYLASE in the last 168 hours. Recent Labs  Lab 04/22/21 1835  AMMONIA 28   Coagulation Profile: Recent Labs  Lab 04/22/21 0855  INR 1.1    CBC: Recent Labs  Lab 04/22/21 0855 04/23/21 0430 04/24/21 0444 04/25/21 0435 04/26/21 0355 04/27/21 0453  WBC 8.1 9.9 6.6 5.8 5.0 5.4  NEUTROABS 6.1  --   --   --   --   --   HGB 15.2* 15.2* 14.3 14.3 13.7 13.9  HCT 51.1* 53.8* 48.0* 47.7* 46.4* 45.6  MCV 95.7 96.9 95.2 92.8 92.8 90.1  PLT 214 231 195 174 178 173   Cardiac Enzymes: No results for input(s): CKTOTAL, CKMB, CKMBINDEX, TROPONINI in the last 168 hours. BNP: Invalid input(s): POCBNP CBG: Recent Labs  Lab 04/27/21 1646 04/27/21 2116 04/28/21 0758 04/28/21 1221 04/28/21 1702  GLUCAP 348* 354* 274* 351* 291*   HbA1C: No results for input(s): HGBA1C in the last 72 hours. Urine analysis:    Component Value Date/Time   COLORURINE YELLOW (A) 05/01/2017 1852   APPEARANCEUR CLEAR (A) 05/01/2017 1852   LABSPEC 1.023 05/01/2017 1852   PHURINE 5.0 05/01/2017 1852   GLUCOSEU >=500 (A) 05/01/2017 1852   HGBUR NEGATIVE 05/01/2017 1852   BILIRUBINUR NEGATIVE 05/01/2017 1852   KETONESUR NEGATIVE 05/01/2017 1852   PROTEINUR 100 (A) 05/01/2017 1852   UROBILINOGEN 0.2 04/11/2014 1927   NITRITE NEGATIVE 05/01/2017 1852   LEUKOCYTESUR NEGATIVE 05/01/2017 1852   Sepsis Labs: (procalcitonin:4,lacticidven:4) ) Recent Results (from the past 240 hour(s))  Blood culture (routine single)     Status: None   Collection Time: 04/22/21  8:55 AM   Specimen: BLOOD  Result Value Ref Range Status   Specimen Description BLOOD RIGHT HAND  Final   Special Requests   Final    BOTTLES DRAWN AEROBIC AND ANAEROBIC Blood Culture results may not be optimal due to an inadequate volume of blood received in culture bottles   Culture   Final    NO GROWTH 5 DAYS Performed at [facility], [address], [place], [place] [address]    Report Status 04/27/2021 FINAL  Final  Resp Panel by RT-PCR (Flu A&B, Covid)  Nasopharyngeal Swab     Status: None   Collection Time: 04/22/21  9:27 AM   Specimen: Nasopharyngeal Swab;  Nasopharyngeal(NP) swabs in vial transport medium  Result Value Ref Range Status   SARS Coronavirus 2 by RT PCR NEGATIVE NEGATIVE Final    Comment: (NOTE) SARS-CoV-2 target nucleic acids are NOT DETECTED.  The SARS-CoV-2 RNA is generally detectable in upper respiratory specimens during the acute phase of infection. The lowest concentration of SARS-CoV-2 viral copies this assay can detect is 138 copies/mL. A negative result does not preclude SARS-Cov-2 infection and should not be used as the sole basis for treatment or other patient management decisions. A negative result may occur with  improper specimen collection/handling, submission of specimen other than nasopharyngeal swab, presence of viral mutation(s) within the areas targeted by this assay, and inadequate number of viral copies(<138 copies/mL). A negative result must be combined with clinical observations, patient history, and epidemiological information. The expected result is Negative.  Fact Sheet for Patients:  BloggerCourse.com  Fact Sheet for Healthcare Providers:  SeriousBroker.it  This test is no t yet approved or cleared by the [place] FDA and  has been authorized for detection and/or diagnosis of SARS-CoV-2 by FDA under an Emergency Use Authorization (EUA). This EUA will remain  in effect (meaning this test can be used) for the duration of the COVID-19 declaration under Section 564(b)(1) of the Act, 21 U.S.C.section 360bbb-3(b)(1), unless the authorization is terminated  or revoked sooner.       Influenza A by PCR NEGATIVE NEGATIVE Final   Influenza B by PCR NEGATIVE NEGATIVE Final    Comment: (NOTE) The Xpert Xpress SARS-CoV-2/FLU/RSV plus assay is intended as an aid in the diagnosis of influenza from Nasopharyngeal swab specimens and should not be used as a sole basis for treatment. Nasal washings and aspirates are unacceptable for Xpert Xpress  SARS-CoV-2/FLU/RSV testing.  Fact Sheet for Patients: BloggerCourse.com  Fact Sheet for Healthcare Providers: SeriousBroker.it  This test is not yet approved or cleared by the [place] FDA and has been authorized for detection and/or diagnosis of SARS-CoV-2 by FDA under an Emergency Use Authorization (EUA). This EUA will remain in effect (meaning this test can be used) for the duration of the COVID-19 declaration under Section 564(b)(1) of the Act, 21 U.S.C. section 360bbb-3(b)(1), unless the authorization is terminated or revoked.  Performed at [facility], [address] [place], [place] [address]   MRSA Next Gen by PCR, Nasal     Status: None   Collection Time: 04/23/21 11:37 AM   Specimen: Nasal Mucosa; Nasal Swab  Result Value Ref Range Status   MRSA by PCR Next Gen NOT DETECTED NOT DETECTED Final    Comment: (NOTE) The GeneXpert MRSA Assay (FDA approved for NASAL specimens only), is one component of a comprehensive MRSA colonization surveillance program. It is not intended to diagnose MRSA infection nor to guide or monitor treatment for MRSA infections. Test performance is not FDA approved in patients less than 79 years old. Performed at [facility], [address], [place], [place] [address]   Urine Culture     Status: None   Collection Time: 04/23/21  6:05 PM   Specimen: Urine, Clean Catch  Result Value Ref Range Status   Specimen Description   Final    URINE, CLEAN CATCH Performed at [facility], [address], [place], [place] [address]    Special Requests   Final    NONE Performed at [facility], [address], [place], [place] [address]    Culture   Final    NO GROWTH Performed at [facility], [address] [place], [place] [address]    Report Status 04/25/2021 FINAL  Final  Respiratory (~20 pathogens) panel by PCR     Status: None   Collection Time: 04/25/21  8:05 PM   Specimen:  Nasopharyngeal Swab; Respiratory  Result Value Ref Range Status   Adenovirus NOT DETECTED NOT DETECTED Final   Coronavirus 229E NOT DETECTED NOT DETECTED Final    Comment: (NOTE) The Coronavirus on the Respiratory Panel, DOES NOT test for the novel  Coronavirus (2019 nCoV)    Coronavirus HKU1 NOT DETECTED NOT DETECTED Final   Coronavirus NL63 NOT DETECTED NOT DETECTED Final   Coronavirus OC43 NOT DETECTED NOT DETECTED Final   Metapneumovirus NOT DETECTED NOT DETECTED Final   Rhinovirus / Enterovirus NOT DETECTED NOT DETECTED Final   Influenza A NOT DETECTED NOT DETECTED Final   Influenza B NOT DETECTED NOT DETECTED Final   Parainfluenza Virus 1 NOT DETECTED NOT DETECTED Final   Parainfluenza Virus 2 NOT DETECTED NOT DETECTED Final   Parainfluenza Virus 3 NOT DETECTED NOT DETECTED Final   Parainfluenza Virus 4 NOT DETECTED NOT DETECTED Final   Respiratory Syncytial Virus NOT DETECTED NOT DETECTED Final   Bordetella pertussis NOT DETECTED NOT DETECTED Final   Bordetella Parapertussis NOT DETECTED NOT DETECTED Final   Chlamydophila pneumoniae NOT DETECTED NOT DETECTED Final   Mycoplasma pneumoniae NOT DETECTED NOT DETECTED Final    Comment: Performed at [facility], [address], [place], [place] [address]     Scheduled Meds:  ARIPiprazole  30 mg Oral Daily   aspirin EC  81 mg Oral q morning   budesonide (PULMICORT) nebulizer solution  0.25 mg Nebulization BID   Chlorhexidine Gluconate Cloth  6 each Topical Daily   clonazePAM  0.5 mg Oral BID   clotrimazole  10 mg Oral 5 X Daily   docusate sodium  100 mg Oral TID   heparin  5,000 Units Subcutaneous Q8H   insulin aspart  0-15 Units Subcutaneous TID WC   insulin aspart  0-5 Units Subcutaneous QHS   insulin aspart  10 Units Subcutaneous TID WC   insulin glargine-yfgn  25 Units Subcutaneous Daily   insulin glargine-yfgn  40 Units Subcutaneous QHS   isosorbide mononitrate  30 mg Oral Daily   mirabegron ER  25 mg Oral BID    nystatin   Topical BID   pravastatin  40 mg Oral q1800   propranolol  60 mg Oral Q1400   vitamin B-12  500 mcg Oral Daily   Continuous Infusions:  Procedures/Studies: CT ABDOMEN PELVIS WO CONTRAST  Result Date: 04/24/2021 CLINICAL DATA:  Abdominal pain and fever. Acute on chronic renal failure. Previous hysterectomy. EXAM: CT ABDOMEN AND PELVIS WITHOUT CONTRAST TECHNIQUE: Multidetector CT imaging of the abdomen and pelvis was performed following the standard protocol without IV contrast. COMPARISON:  04/11/2014 FINDINGS: Lower chest: No acute findings. Hepatobiliary: No mass visualized on this unenhanced exam. Gallbladder is unremarkable. No evidence of biliary ductal dilatation. Pancreas: No mass or inflammatory process visualized on this unenhanced exam. Spleen:  Within normal limits in size. Adrenals/Urinary tract: No evidence of urolithiasis or hydronephrosis. Increased symmetric bilateral perinephric stranding is noted, and consistent with medical renal disease. Image degradation by motion artifact noted. Several lesions are seen in both kidneys which have increased in size since previous study, but cannot be characterized on  this unenhanced exam. Urinary bladder is empty with Foley catheter in place. Stomach/Bowel: No evidence of obstruction, inflammatory process, or abnormal fluid collections. Vascular/Lymphatic: No pathologically enlarged lymph nodes identified. No evidence of abdominal aortic aneurysm. Aortic atherosclerotic calcification noted. Reproductive: Prior hysterectomy noted. Adnexal regions are unremarkable in appearance. Other: A small paraumbilical ventral hernia is again seen which contains only fat. New moderate diffuse body wall edema is also seen. Musculoskeletal:  No suspicious bone lesions identified. IMPRESSION: No evidence of urolithiasis or hydronephrosis. Increased symmetric bilateral perinephric stranding, which is nonspecific but likely secondary to medical renal disease.  Increased size of several bilateral renal lesions, which cannot be characterized on this unenhanced exam. Abdomen MRI is recommended for further characterization, preferably without and with contrast if renal function permits. New diffuse body wall edema. Stable small paraumbilical ventral hernia containing only fat. Electronically Signed   By: Danae Orleans M.D.   On: 04/24/2021 19:42   DG Chest 1 View  Result Date: 04/22/2021 CLINICAL DATA:  Fall today. PT c/o pain in pelvis and Rt shoulder/Rt upper chest since fall. Hx COPD, nonsmoker, diabetes, HTN, obesity EXAM: CHEST  1 VIEW COMPARISON:  04/15/2021 FINDINGS: Lungs are clear. Heart size upper limits normal. Aortic Atherosclerosis (ICD10-170.0). No effusion.  No pneumothorax. Right shoulder dislocation IMPRESSION: No acute cardiopulmonary disease. Right shoulder dislocation Electronically Signed   By: Corlis Leak M.D.   On: 04/22/2021 10:10   DG Pelvis 1-2 Views  Result Date: 04/22/2021 CLINICAL DATA:  Pain post fall EXAM: PELVIS - 1-2 VIEW COMPARISON:  CT 04/11/2014 FINDINGS: There is no evidence of pelvic fracture or diastasis. The right femoral neck is not well profiled. Chronic osteitis pubis. Degenerative changes in the lower lumbar spine. Surgical clips in the left lower quadrant. Bilateral pelvic phleboliths. No pelvic bone lesions are seen. IMPRESSION: No acute findings. Electronically Signed   By: Corlis Leak M.D.   On: 04/22/2021 10:12   DG Shoulder Right  Result Date: 04/25/2021 CLINICAL DATA:  Right shoulder reduction. EXAM: DG C-ARM 1-60 MIN; RIGHT SHOULDER - 2+ VIEW FLUOROSCOPY TIME:  Fluoroscopy Time:  11 seconds. Number of Acquired Spot Images: 3 COMPARISON:  04/24/2021. FINDINGS: Three C-arm fluoroscopic images were obtained intraoperatively and submitted for post operative interpretation. These images demonstrate reduction of the right shoulder a which appears to be located but potentially slightly superiorly subluxed. Fracture fragment  better characterized on prior radiographs. Please see the performing provider's procedural report for further detail. IMPRESSION: Intraoperative fluoroscopy, as detailed above. Electronically Signed   By: Feliberto Harts MD   On: 04/25/2021 15:47   DG Shoulder Right  Result Date: 04/24/2021 CLINICAL DATA:  Anterior shoulder dislocation patient reports dislocated right shoulder yesterday with continued pain. EXAM: RIGHT SHOULDER - 2+ VIEW COMPARISON:  Shoulder radiograph 04/22/2021. Procedural fluoroscopy 04/22/2021 during shoulder dislocation reduction. FINDINGS: Recurrent anterior dislocation of the humeral head with respect to the glenoid. Small displaced fracture fragments laterally, donor site may be the lateral humeral head, however fragments also project adjacent to the glenoid on the [place] view. IMPRESSION: Recurrent anterior shoulder dislocation. Small displaced fracture fragments laterally, donor site may be the lateral humeral head, however fragments also project adjacent to the glenoid on the [place] view. Electronically Signed   By: Narda Rutherford M.D.   On: 04/24/2021 17:41   DG Shoulder Right  Result Date: 04/22/2021 CLINICAL DATA:  Right shoulder dislocation. EXAM: DG C-ARM 1-60 MIN; RIGHT SHOULDER - 2+ VIEW FLUOROSCOPY TIME:  Fluoroscopy Time:  48 seconds.  Number of Acquired Spot Images: 3 COMPARISON:  04/22/2021. FINDINGS: Three C-arm fluoroscopic images were obtained intraoperatively and submitted for post operative interpretation. These images demonstrate closed reduction of the right shoulder. The shoulder appears to be located. Please see the performing provider's procedural report for further detail. IMPRESSION: Intraoperative fluoroscopy during shoulder reduction. The right shoulder appears to be located. Post reduction radiographs could confirm and better evaluate for fracture. Electronically Signed   By: Feliberto Harts MD   On: 04/22/2021 16:25   DG Shoulder  Right  Result Date: 04/22/2021 CLINICAL DATA:  Pain post fall EXAM: RIGHT SHOULDER - 2+ VIEW COMPARISON:  None. FINDINGS: Anterior right shoulder dislocation. Small cortical fragments project at the posterior margin of the glenoid. Proximal humerus appears intact. IMPRESSION: Anterior shoulder dislocation Electronically Signed   By: Corlis Leak M.D.   On: 04/22/2021 10:13   CT Head Wo Contrast  Result Date: 04/22/2021 CLINICAL DATA:  Larey Seat, pain EXAM: CT HEAD WITHOUT CONTRAST TECHNIQUE: Contiguous axial images were obtained from the base of the skull through the vertex without intravenous contrast. COMPARISON:  02/07/2012 FINDINGS: Brain: Mild parenchymal atrophy. Patchy areas of hypoattenuation in deep and periventricular white matter bilaterally. Negative for acute intracranial hemorrhage, mass lesion, acute infarction, midline shift, or mass-effect. Acute infarct may be inapparent on noncontrast CT. Ventricles and sulci symmetric. Vascular: No hyperdense vessel or unexpected calcification. Skull: Normal. Negative for fracture or focal lesion. Sinuses/Orbits: No acute finding. Other: None IMPRESSION: 1. Negative for bleed or other acute intracranial process. 2. Atrophy and nonspecific white matter changes. Electronically Signed   By: Corlis Leak M.D.   On: 04/22/2021 10:09   CT CHEST WO CONTRAST  Result Date: 04/22/2021 CLINICAL DATA:  Dyspnea, chronic, unclear etiology Respiratory illness, nondiagnostic xray EXAM: CT CHEST WITHOUT CONTRAST TECHNIQUE: Multidetector CT imaging of the chest was performed following the standard protocol without IV contrast. COMPARISON:  CT chest abdomen pelvis 04/11/2014 FINDINGS: Cardiovascular: Normal heart size. Trace pericardial effusion. The thoracic aorta is normal in caliber. At least mild atherosclerotic plaque of the thoracic aorta. At least 2 vessel coronary artery calcifications. Question enlarged main pulmonary artery. Mediastinum/Nodes: No gross hilar adenopathy,  noting limited sensitivity for the detection of hilar adenopathy on this noncontrast study. No enlarged mediastinal or axillary lymph nodes. Thyroid gland, trachea, and esophagus demonstrate no significant findings. Lungs/Pleura: Bilateral dependent posterior subsegmental atelectasis. Bilateral lower lobe base linear atelectasis versus scarring. No focal consolidation. Few scattered calcified and noncalcified pulmonary micronodules. No pulmonary mass. No pleural effusion. No pneumothorax. Upper Abdomen: There is a 1.6 cm fluid density lesion at the superior pole of the left kidney likely represents a simple renal cyst. Otherwise no acute abnormality. Musculoskeletal: No chest wall abnormality. No suspicious lytic or blastic osseous lesions. No acute displaced fracture. Multilevel degenerative changes of the spine. Old posterior left third rib fracture. IMPRESSION: 1. No acute intrathoracic abnormality with limited evaluation on this noncontrast study. 2. Question enlarged main pulmonary artery suggestive of pulmonary hypertension. 3. Aortic Atherosclerosis (ICD10-I70.0) including coronary calcifications. Electronically Signed   By: Tish Frederickson M.D.   On: 04/22/2021 20:52   CT Angio Chest Pulmonary Embolism (PE) W or WO Contrast  Result Date: 04/27/2021 CLINICAL DATA:  Acute on chronic respiratory failure with hypoxia, elevated d-dimer. EXAM: CT ANGIOGRAPHY CHEST WITH CONTRAST TECHNIQUE: Multidetector CT imaging of the chest was performed using the standard protocol during bolus administration of intravenous contrast. Multiplanar CT image reconstructions and MIPs were obtained to evaluate the vascular anatomy.  CONTRAST:  OMNIPAQUE IOHEXOL 350 MG/ML SOLN COMPARISON:  CT angiography chest 04/22/2021, CT abdomen pelvis 04/24/2021 FINDINGS: Cardiovascular: Fairly satisfactory opacification of the pulmonary arteries to the segmental level. No evidence of pulmonary embolism. Normal heart size. No significant  pericardial effusion. The thoracic aorta is normal in caliber. Mild atherosclerotic plaque of the thoracic aorta. Coronary artery calcifications. Mediastinum/Nodes: No enlarged mediastinal, hilar, or axillary lymph nodes. Thyroid gland, trachea, and esophagus demonstrate no significant findings. Lungs/Pleura: Bilateral lower lobe subsegmental atelectasis. No focal consolidation. No pulmonary nodule. No pulmonary mass. No pleural effusion. No pneumothorax. Upper Abdomen: Poorly visualized evaluated 2.1 cm density within left kidney with a no acute abnormality. Musculoskeletal: No abdominal wall hernia or abnormality. No suspicious lytic or blastic osseous lesions. No acute displaced fracture. Multilevel degenerative changes of the spine. Review of the MIP images confirms the above findings. IMPRESSION: 1. No central or proximal segmental pulmonary embolus. Limited evaluation more distally due to timing of contrast. 2. No acute intrathoracic abnormality. 3. Indeterminate 2.1 cm left renal lesion. Recommend MRI renal protocol for further evaluation. 4. Aortic Atherosclerosis (ICD10-I70.0) -including coronary artery calcifications. Electronically Signed   By: Tish Frederickson M.D.   On: 04/27/2021 23:47   US RENAL  Result Date: 04/22/2021 CLINICAL DATA:  Dyspnea.  Chronic stage III A renal disease. EXAM: RENAL / URINARY TRACT ULTRASOUND COMPLETE COMPARISON:  None. FINDINGS: Right Kidney: Renal measurements: 13.2 x 6.5 x 6.5 cm = volume: 292 mL. Echogenicity within normal limits. No mass or hydronephrosis visualized. Left Kidney: Limited evaluation. Renal measurements: 14.7 x 6.8 x 8.1 cm= volume: 423 mL. Echogenicity within normal limits. There is a 3.7 x 3.2 x 3.7 cm cystic lesion within the left kidney. No solid mass or hydronephrosis visualized. Urinary bladder: Appears normal for degree of bladder distention. Other: None. IMPRESSION: Grossly unremarkable renal ultrasound with a 3.7 cm left simple renal cyst.  Please note otherwise limited evaluation of the left kidney. Electronically Signed   By: Tish Frederickson M.D.   On: 04/22/2021 20:05   US Venous Img Lower Bilateral (DVT)  Result Date: 04/23/2021 CLINICAL DATA:  Chronic lower extremity pain and edema EXAM: BILATERAL LOWER EXTREMITY VENOUS DOPPLER ULTRASOUND TECHNIQUE: Gray-scale sonography with graded compression, as well as color Doppler and duplex ultrasound were performed to evaluate the lower extremity deep venous systems from the level of the common femoral vein and including the common femoral, femoral, profunda femoral, popliteal and calf veins including the posterior tibial, peroneal and gastrocnemius veins when visible. The superficial great saphenous vein was also interrogated. Spectral Doppler was utilized to evaluate flow at rest and with distal augmentation maneuvers in the common femoral, femoral and popliteal veins. COMPARISON:  None. FINDINGS: RIGHT LOWER EXTREMITY Common Femoral Vein: No evidence of thrombus. Normal compressibility, respiratory phasicity and response to augmentation. Saphenofemoral Junction: No evidence of thrombus. Normal compressibility and flow on color Doppler imaging. Profunda Femoral Vein: No evidence of thrombus. Normal compressibility and flow on color Doppler imaging. Femoral Vein: No evidence of thrombus. Normal compressibility, respiratory phasicity and response to augmentation. Popliteal Vein: No evidence of thrombus. Normal compressibility, respiratory phasicity and response to augmentation. Calf Veins: Limited visualization because of body habitus. Tibial peroneal veins appear grossly patent and compressible. LEFT LOWER EXTREMITY Common Femoral Vein: No evidence of thrombus. Normal compressibility, respiratory phasicity and response to augmentation. Saphenofemoral Junction: No evidence of thrombus. Normal compressibility and flow on color Doppler imaging. Profunda Femoral Vein: No evidence of thrombus. Normal  compressibility and flow on  color Doppler imaging. Femoral Vein: No evidence of thrombus. Normal compressibility, respiratory phasicity and response to augmentation. Popliteal Vein: No evidence of thrombus. Normal compressibility, respiratory phasicity and response to augmentation. Calf Veins: Again limited because of body habitus. Posterior tibial vein appears patent. Peroneal vein not visualized. IMPRESSION: No significant femoropopliteal DVT in either extremity. Limited assessment of the calf veins because of body habitus. Electronically Signed   By: Judie PetitM.  Shick M.D.   On: 04/23/2021 08:02   DG CHEST PORT 1 VIEW  Result Date: 04/27/2021 CLINICAL DATA:  Short of breath, COPD EXAM: PORTABLE CHEST 1 VIEW COMPARISON:  04/23/2021 FINDINGS: Single frontal view of the chest demonstrates an enlarged cardiac silhouette. There is central vascular congestion without airspace disease, effusion, or pneumothorax. No acute bony abnormalities. IMPRESSION: 1. Central vascular congestion without overt edema. Electronically Signed   By: Sharlet SalinaMichael  Brown M.D.   On: 04/27/2021 16:20   DG CHEST PORT 1 VIEW  Result Date: 04/23/2021 CLINICAL DATA:  Shortness of breath.  Acute respiratory failure. EXAM: PORTABLE CHEST 1 VIEW COMPARISON:  April 22, 2021 FINDINGS: Stable cardiomegaly. The hila and mediastinum are not significantly changed given the low volume rotated portable technique. Increased interstitial markings bilaterally. Mild opacity in the bases, likely atelectasis. No other acute abnormalities. IMPRESSION: Findings are favored to represent cardiomegaly with pulmonary venous congestion/mild edema and bibasilar opacities, likely atelectasis. Electronically Signed   By: Gerome Samavid  Williams III M.D   On: 04/23/2021 11:29   DG Chest Port 1 View  Result Date: 04/15/2021 CLINICAL DATA:  sob EXAM: PORTABLE CHEST 1 VIEW COMPARISON:  April 16, 2014 FINDINGS: The cardiomediastinal silhouette is unchanged and enlarged in  contour.Atherosclerotic calcifications. No pleural effusion. No pneumothorax. Perihilar vascular prominence without overt edema. Visualized abdomen is unremarkable. No acute osseous abnormality. IMPRESSION: No acute cardiopulmonary abnormality. Electronically Signed   By: Meda KlinefelterStephanie  Peacock MD   On: 04/15/2021 15:55   DG C-Arm 1-60 Min  Result Date: 04/25/2021 CLINICAL DATA:  Right shoulder reduction. EXAM: DG C-ARM 1-60 MIN; RIGHT SHOULDER - 2+ VIEW FLUOROSCOPY TIME:  Fluoroscopy Time:  11 seconds. Number of Acquired Spot Images: 3 COMPARISON:  04/24/2021. FINDINGS: Three C-arm fluoroscopic images were obtained intraoperatively and submitted for post operative interpretation. These images demonstrate reduction of the right shoulder a which appears to be located but potentially slightly superiorly subluxed. Fracture fragment better characterized on prior radiographs. Please see the performing provider's procedural report for further detail. IMPRESSION: Intraoperative fluoroscopy, as detailed above. Electronically Signed   By: Feliberto HartsFrederick S Jones MD   On: 04/25/2021 15:47   DG C-Arm 1-60 Min  Result Date: 04/22/2021 CLINICAL DATA:  Right shoulder dislocation. EXAM: DG C-ARM 1-60 MIN; RIGHT SHOULDER - 2+ VIEW FLUOROSCOPY TIME:  Fluoroscopy Time:  48 seconds. Number of Acquired Spot Images: 3 COMPARISON:  04/22/2021. FINDINGS: Three C-arm fluoroscopic images were obtained intraoperatively and submitted for post operative interpretation. These images demonstrate closed reduction of the right shoulder. The shoulder appears to be located. Please see the performing provider's procedural report for further detail. IMPRESSION: Intraoperative fluoroscopy during shoulder reduction. The right shoulder appears to be located. Post reduction radiographs could confirm and better evaluate for fracture. Electronically Signed   By: Feliberto HartsFrederick S Jones MD   On: 04/22/2021 16:25   ECHOCARDIOGRAM COMPLETE  Result Date: 04/24/2021     ECHOCARDIOGRAM REPORT   Patient Name:   Casey MannsJANET F Shepherd Date of Exam: 04/23/2021 Medical Rec #:  [id number]       Height:  68.0 in Accession #:    [id number]      Weight:       346.8 lb Date of Birth:  [birth year]       BSA:          2.582 m Patient Age:    55 years        BP:           160/67 mmHg Patient Gender: F               HR:           94 bpm. Exam Location:  Inpatient Procedure: 2D Echo, Cardiac Doppler and Color Doppler Indications:    Dyspnea R06.00  History:        Patient has prior history of Echocardiogram examinations, most                 recent 04/19/2014. COPD; Risk Factors:Hypertension and Diabetes.                 Parkinson's Disease. Seizures.  Sonographer:    Tiffany Dance Referring Phys: [phone] DAVID TAT  Sonographer Comments: Echo performed with patient supine and on artificial respirator and Technically difficult study due to poor echo windows. IMPRESSIONS  1. Left ventricular ejection fraction, by estimation, is 70 to 75%. The left ventricle has hyperdynamic function. The left ventricle has no regional wall motion abnormalities. There is mild left ventricular hypertrophy. Left ventricular diastolic parameters are indeterminate.  2. Right ventricular systolic function is mildly reduced. The right ventricular size is mildly enlarged. Tricuspid regurgitation signal is inadequate for assessing PA pressure.  3. Left atrial size was mildly dilated.  4. Right atrial size was mildly dilated.  5. A small pericardial effusion is present.  6. The mitral valve is degenerative. Trivial mitral valve regurgitation. Mild mitral stenosis. The mean mitral valve gradient is 4.6 mmHg with average heart rate of 95 bpm. Severe mitral annular calcification.  7. The aortic valve is grossly normal. There is mild calcification of the aortic valve. Aortic valve regurgitation is not visualized. No aortic stenosis is present.  8. Increased flow velocities may be secondary to anemia, thyrotoxicosis, hyperdynamic or  high flow state. 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 Left ventricular ejection fraction, by estimation, is 70 to 75%. The left ventricle has hyperdynamic function. The left ventricle has no regional wall motion abnormalities.  The left ventricular internal cavity size was normal in size. There is mild left ventricular hypertrophy. Left ventricular diastolic parameters are indeterminate. Right Ventricle: The right ventricular size is mildly enlarged. Right vetricular wall thickness was not well visualized. Right ventricular systolic function is mildly reduced. Tricuspid regurgitation signal is inadequate for assessing PA pressure. Left Atrium: Left atrial size was mildly dilated. Right Atrium: Right atrial size was mildly dilated. Pericardium: A small pericardial effusion is present. Mitral Valve: The mitral valve is degenerative in appearance. Severe mitral annular calcification. Trivial mitral valve regurgitation. Mild mitral valve stenosis. The mean mitral valve gradient is 4.6 mmHg with average heart rate of 95 bpm. Tricuspid Valve: The tricuspid valve is grossly normal. Tricuspid valve regurgitation is trivial. No evidence of tricuspid stenosis. Aortic Valve: The aortic valve is grossly normal. There is mild calcification of the aortic valve. Aortic valve regurgitation is not visualized. No aortic stenosis is present. Pulmonic Valve: The pulmonic valve was grossly normal. Pulmonic valve regurgitation is trivial. No evidence of pulmonic stenosis. Aorta: The aortic root is  normal in size and structure. Venous: The inferior vena cava was not well visualized. IVC assessment for right atrial pressure unable to be performed due to mechanical ventilation. IAS/Shunts: No atrial level shunt detected by color flow Doppler.  LEFT VENTRICLE PLAX 2D LVIDd:         4.04 cm  Diastology LVIDs:         2.71 cm  LV e' medial:    6.22 cm/s LV PW:         1.30 cm  LV E/e'  medial:  19.9 LV IVS:        1.11 cm  LV e' lateral:   9.20 cm/s LVOT diam:     1.90 cm  LV E/e' lateral: 13.5 LV SV:         106 LV SV Index:   41 LVOT Area:     2.84 cm  RIGHT VENTRICLE RV Basal diam:  3.98 cm RV Mid diam:    2.75 cm RV S prime:     16.20 cm/s TAPSE (M-mode): 2.0 cm LEFT ATRIUM             Index       RIGHT ATRIUM           Index LA diam:        3.30 cm 1.28 cm/m  RA Area:     18.60 cm LA Vol (A2C):   65.4 ml 25.33 ml/m RA Volume:   55.10 ml  21.34 ml/m LA Vol (A4C):   41.0 ml 15.88 ml/m LA Biplane Vol: 50.1 ml 19.40 ml/m  AORTIC VALVE LVOT Vmax:   181.00 cm/s LVOT Vmean:  124.000 cm/s LVOT VTI:    0.375 m  AORTA Ao Root diam: 3.10 cm Ao Asc diam:  3.50 cm MITRAL VALVE MV Area (PHT): 2.29 cm     SHUNTS MV Area VTI:   3.14 cm     Systemic VTI:  0.38 m MV Mean grad:  4.6 mmHg     Systemic Diam: 1.90 cm MV VTI:        0.34 m MV Decel Time: 331 msec MV E velocity: 124.00 cm/s MV A velocity: 144.00 cm/s MV E/A ratio:  0.86 Weston Brass MD Electronically signed by Weston Brass MD Signature Date/Time: 04/24/2021/9:10:32 AM    Final     Erick Blinks, MD  [facility]  If 7PM-7AM, please contact night-coverage [email or web address]  04/28/2021, 8:20 PM   LOS: 6 days

## 2021-04-28 NOTE — Plan of Care (Signed)
  Problem: Education: Goal: Knowledge of General Education information will improve Description Including pain rating scale, medication(s)/side effects and non-pharmacologic comfort measures Outcome: Progressing   Problem: Health Behavior/Discharge Planning: Goal: Ability to manage health-related needs will improve Outcome: Progressing   

## 2021-04-29 ENCOUNTER — Telehealth: Payer: Self-pay | Admitting: Pulmonary Disease

## 2021-04-29 DIAGNOSIS — J9621 Acute and chronic respiratory failure with hypoxia: Secondary | ICD-10-CM | POA: Diagnosis not present

## 2021-04-29 DIAGNOSIS — J9622 Acute and chronic respiratory failure with hypercapnia: Secondary | ICD-10-CM | POA: Diagnosis not present

## 2021-04-29 LAB — GLUCOSE, CAPILLARY
Glucose-Capillary: 228 mg/dL — ABNORMAL HIGH (ref 70–99)
Glucose-Capillary: 270 mg/dL — ABNORMAL HIGH (ref 70–99)
Glucose-Capillary: 225 mg/dL — ABNORMAL HIGH (ref 70–99)
Glucose-Capillary: 287 mg/dL — ABNORMAL HIGH (ref 70–99)

## 2021-04-29 LAB — CREATININE, SERUM
Creatinine, Ser: 1.18 mg/dL — ABNORMAL HIGH (ref 0.44–1.00)
GFR, Estimated: 54 mL/min — ABNORMAL LOW (ref >60)

## 2021-04-29 MED ORDER — INSULIN ASPART 100 UNIT/ML IJ SOLN
15.0000 [IU] | Freq: Three times a day (TID) | INTRAMUSCULAR | Status: DC
  Administered 2021-04-29 – 2021-05-02 (×10): 15 [IU] via SUBCUTANEOUS

## 2021-04-29 MED ORDER — INSULIN GLARGINE-YFGN 100 UNIT/ML ~~LOC~~ SOLN
40.0000 [IU] | Freq: Every day | SUBCUTANEOUS | Status: DC
  Administered 2021-04-29 – 2021-05-02 (×4): 40 [IU] via SUBCUTANEOUS
  Filled 2021-04-29 (×5): qty 0.4

## 2021-04-29 MED ORDER — NYSTATIN 100000 UNIT/GM EX POWD: Freq: Two times a day (BID) | CUTANEOUS | 0 refills | Status: DC

## 2021-04-29 MED ORDER — INSULIN GLARGINE 100 UNIT/ML ~~LOC~~ SOLN: 45.0000 [IU] | Freq: Two times a day (BID) | SUBCUTANEOUS | 2 refills | Status: DC

## 2021-04-29 MED ORDER — BUDESONIDE 0.25 MG/2ML IN SUSP: 0.2500 mg | Freq: Two times a day (BID) | RESPIRATORY_TRACT | 12 refills | Status: DC

## 2021-04-29 MED ORDER — IPRATROPIUM-ALBUTEROL 0.5-2.5 (3) MG/3ML IN SOLN
3.0000 mL | RESPIRATORY_TRACT | 0 refills | Status: DC | PRN
Start: 2021-04-29 — End: 2022-07-08

## 2021-04-29 NOTE — NC FL2 (Signed)
[place] MEDICAID FL2 LEVEL OF CARE SCREENING TOOL     IDENTIFICATION  Patient Name: [name] Birthdate: 01-16-1966 Sex: female Admission Date (Current Location): 04/22/2021  [facility] and [place] Number:  [employer] and Address:  [facility],  [address], [place] [address]      Provider Number: [id number]  Attending Physician Name and Address:  Erick Blinks, MD  Relative Name and Phone Number:  Rudy Jew [phone]    Current Level of Care: SNF Recommended Level of Care: Skilled Nursing Facility Prior Approval Number:    Date Approved/Denied:   PASRR Number: Pending  Discharge Plan: SNF    Current Diagnoses: Patient Active Problem List   Diagnosis Date Noted   Closed fracture dislocation of joint of right shoulder girdle    AKI (acute kidney injury) (HCC)    Traumatic closed displaced fracture of right shoulder with anterior dislocation 04/25/2021   Acute on chronic respiratory failure with hypoxia and hypercapnia (HCC) 04/23/2021   Acute on chronic respiratory failure with hypoxia (HCC) 04/22/2021   Acute renal failure superimposed on stage 3a chronic kidney disease (HCC) 04/22/2021   Fall    Anterior shoulder dislocation, right, initial encounter    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   Acute respiratory failure with hypoxia (HCC) 04/15/2014   Septic shock(785.52) 04/15/2014   ARF (acute renal failure) (HCC) 04/12/2014   SBO (small bowel obstruction) (HCC) 04/11/2014   Bipolar disorder (HCC) 04/11/2014   DM type 2 (diabetes mellitus, type 2) (HCC) 04/11/2014   Seizure disorder (HCC) 04/11/2014   DEGENERATIVE JOINT DISEASE, RIGHT KNEE 07/13/2008   KNEE PAIN 07/13/2008    Orientation RESPIRATION BLADDER Height & Weight     Self, Time, Situation, Place  O2 (4L) Incontinent Weight: (!) 341 lb 4.4 oz (154.8 kg) Height:  5\' 8"  (172.7 cm)  BEHAVIORAL SYMPTOMS/MOOD  NEUROLOGICAL BOWEL NUTRITION STATUS    Convulsions/Seizures Continent Diet  AMBULATORY STATUS COMMUNICATION OF NEEDS Skin   Extensive Assist Non-Verbally Normal                       Personal Care Assistance Level of Assistance  Bathing, Feeding, Dressing Bathing Assistance: Maximum assistance Feeding assistance: Limited assistance Dressing Assistance: Maximum assistance Total Care Assistance: Maximum assistance   Functional Limitations Info  Sight, Hearing, Speech Sight Info: Adequate Hearing Info: Adequate Speech Info: Adequate    SPECIAL CARE FACTORS FREQUENCY  PT (By licensed PT)     PT Frequency: 5x              Contractures Contractures Info: Not present    Additional Factors Info  Code Status Code Status Info: Full             Current Medications (04/29/2021):  This is the current hospital active medication list Current Facility-Administered Medications  Medication Dose Route Frequency Provider Last Rate Last Admin   acetaminophen (TYLENOL) tablet 650 mg  650 mg Oral Q6H PRN 06/29/2021, MD   650 mg at 04/29/21 0747   ARIPiprazole (ABILIFY) tablet 30 mg  30 mg Oral Daily 06/29/21, MD   30 mg at 04/29/21 1021   aspirin EC tablet 81 mg  81 mg Oral q morning 06/29/21, MD   81 mg at 04/29/21 1042   budesonide (PULMICORT) nebulizer solution 0.25 mg  0.25 mg Nebulization BID 06/29/21, MD   0.25 mg at 04/29/21 [phone]  Chlorhexidine Gluconate Cloth 2 % PADS 6 each  6 each Topical Daily Vickki Hearing, MD   6 each at 04/29/21 1023   clonazePAM (KLONOPIN) tablet 0.5 mg  0.5 mg Oral BID Erick Blinks, MD   0.5 mg at 04/29/21 1021   clotrimazole (MYCELEX) troche 10 mg  10 mg Oral 5 X Daily Vickki Hearing, MD   10 mg at 04/29/21 1042   docusate sodium (COLACE) capsule 100 mg  100 mg Oral TID Vickki Hearing, MD   100 mg at 04/29/21 1022   heparin injection 5,000 Units  5,000 Units Subcutaneous Q8H Vickki Hearing, MD   5,000 Units at 04/29/21 6389   insulin aspart (novoLOG) injection 0-15 Units  0-15 Units Subcutaneous TID WC Vickki Hearing, MD   8 Units at 04/29/21 1222   insulin aspart (novoLOG) injection 0-5 Units  0-5 Units Subcutaneous QHS Vickki Hearing, MD   5 Units at 04/28/21 2218   insulin aspart (novoLOG) injection 15 Units  15 Units Subcutaneous TID WC Erick Blinks, MD   15 Units at 04/29/21 1223   insulin glargine-yfgn (SEMGLEE) injection 40 Units  40 Units Subcutaneous QHS Erick Blinks, MD   40 Units at 04/28/21 2218   insulin glargine-yfgn (SEMGLEE) injection 40 Units  40 Units Subcutaneous Daily Erick Blinks, MD   40 Units at 04/29/21 1023   ipratropium-albuterol (DUONEB) 0.5-2.5 (3) MG/3ML nebulizer solution 3 mL  3 mL Nebulization Q4H PRN Vickki Hearing, MD       isosorbide mononitrate (IMDUR) 24 hr tablet 30 mg  30 mg Oral Daily Vickki Hearing, MD   30 mg at 04/29/21 1023   levalbuterol (XOPENEX) nebulizer solution 0.63 mg  0.63 mg Nebulization Q6H PRN Erick Blinks, MD       mirabegron ER (MYRBETRIQ) tablet 25 mg  25 mg Oral BID Vickki Hearing, MD   25 mg at 04/29/21 1022   nystatin (MYCOSTATIN/NYSTOP) topical powder   Topical BID Vickki Hearing, MD   Given at 04/29/21 1024   ondansetron (ZOFRAN) injection 4 mg  4 mg Intravenous Q6H PRN Vickki Hearing, MD       pravastatin (PRAVACHOL) tablet 40 mg  40 mg Oral q1800 Vickki Hearing, MD   40 mg at 04/28/21 1709   propranolol (INDERAL) tablet 60 mg  60 mg Oral Q1400 Erick Blinks, MD   60 mg at 04/28/21 1423   vitamin B-12 (CYANOCOBALAMIN) tablet 500 mcg  500 mcg Oral Daily Vickki Hearing, MD   500 mcg at 04/29/21 1022     Discharge Medications: Please see discharge summary for a list of discharge medications.  Relevant Imaging Results:  Relevant Lab Results:   Additional Information Pt SSN [id number]  Barry Brunner, LCSW

## 2021-04-29 NOTE — Telephone Encounter (Signed)
ATC Ed to get patient scheduled with Dr. Craige Cotta since his schedule is out for October, [facility]  If he calls back please schedule patient with Dr. Craige Cotta sometime in October.

## 2021-04-29 NOTE — Progress Notes (Signed)
PROGRESS NOTE  [name] [name] DOB: [birth year] DOA: 04/22/2021 [facility]   Brief History:   55 y.o. female with medical history of current impairment, bipolar disorder, seizure disorder, hypertension, COPD, chronic respiratory failure on 3 L, diabetes mellitus type 2, CKD stage III presenting with mechanical fall on the a.m. of 04/22/2021.  The patient is a difficult historian secondary to cognitive impairment.  History is also supplemented by the patient's spouse.  Unfortunately, he is also a difficult historian.  Nevertheless, it appears that the patient had a mechanical fall getting out of bed on the morning of 04/22/2021.  She has not had a history of frequent falls.  She last fell over 1 year prior to this admission.  There was no loss of consciousness this morning.  After the fall, the patient was complaining of pain all over.  As result, the patient was brought to emergency department for further evaluation.  Notably, the patient was noted to have a fever by EMS of 101.0 F.  She was given acetaminophen by EMS.  Prior to my assessment, the patient was noted to have an increasing oxygen requirement.  She was placed on 6 L nasal cannula.  Her spouse relates that the patient has been having more dyspnea on exertion over the past 3 to 4 weeks.  Apparently, she had a telehealth visit with her PCP a couple weeks prior to this admission.  She was given some type of medication which the spouse and patient do not recall.  There was some mild improvement in her breathing, but she complains of shortness of breath with exertion once again.  She denies any chest pain, headache, nausea, vomiting, diarrhea, abdominal pain.   04/23/21 10AM--I arrive to find patient somnolent but aroused to voice and answered yes/no to simple questions Patient stated she was thirsty but denied sob, cp. Upon checking oxygen saturation myself personally, she was 46%.  I checked  the nasal cannula in the patient's nose and followed the tubing the end of which was on the floor (not connected to the wall mounted oxygen). Then I looked at the wall mounted oxygen device which was connected to oxygen tubing.  I followed the oxygen tubing connected to the wall and it terminated (was connected) to a nebulizer mask which was sitting in a bag hanging on the wall. I connected the patient's nasal cannula tubing from her nose to the oxygen and increased it to 6L.  Over a period of 3-5 min.  Her saturation increased to 91-92%.  I then ordered STAT ABG and CXR and placed orders for transfer to SDU.   On 04/24/21, patient was being bathed and her sling was removed.  The patient complained of pain in her right shoulder again.  Repeat xray showed recurrent anterior dislocation.  Dr. Romeo AppleHarrison was notified and she was taken back to OR 04/25/21 to have another closed reduction under conscious sedation.     Assessment/Plan: Acute on chronic respiratory failure with hypoxia and hypercarbia -Obtain ABG 7.281/72/85/28 on 0.5 -Check BNP--606, improved to 73 -likely due to the patient's OSA/OHS and fluid overload -She has never smoked -Chronically on 3 L nasal cannula at home -04/23/21--placed on bipap due to somnolence and hypercarbia -Currently on 4 L -Goal is to keep saturation greater 90% -CT chest--bibasilar atelectasis, no infiltrates or effusions -Echo EF 70-75%, no WMA; mild decrease RV function, mild AS -continue duonebs -wean oxygen  for saturation >90% -8/2 Lasix 40 mg IV x 1 -D-dimer mildly elevated -CT angio chest negative for pulmonary embolism -Appreciate pulmonology input -Suspect that she is improving and probably approaching her baseline   Anterior right shoulder dislocation -Orthopedics, Dr. Romeo Apple consult appreciated -7/29--closed reduction of shoulder dislocation -minimize hypnotic meds -minimize opioids/pain control -04/25/21--repeat closed reduction   Acute on  chronic renal failure--CKD 3a -Difficult to clarify whether the patient has a degree of progression of underlying CKD -Previous baseline creatinine 1.0-1.4 -Renal ultrasound--no hydronephrosis -presented with creatinine of 2.4, now improved to 1.27   Fever -Check PCT--0.16 -Lactic acid 1.1 -Follow blood cultures--neg to date -Obtain UA and urine culture--neg -7/29 CT chest--no consolidation -obtain CT abd/pelvis--no acute findings -d/c abx and monitor -viral respiratory panel--neg -continue to follow for recurrence   Uncontrolled diabetes mellitus type 2 with hyperglycemia -Check A1c--8.5 -increase lantus to 40 units bid -She is also on NovoLog 15 units 3 times daily with meals -NovoLog sliding scale   Bipolar disorder -Minimize hypnotic medications -Continue Abilify, Cymbalta   Morbid Obesity -BMI 51.89 -lifestyle modification   Leg pain -venous duplex--no DVT   Essential Hypertension -restart inderal and imdur -on IV metoprolol while inderal was held>>restarted inderal   Hyperlipidemia -restart statin   Low B12 -supplement po -give IM injection x 1  Tremor -chronically on propranolol, which will be restarted -restart reduced dose klonopin      Status is: Inpatient   Remains inpatient appropriate because:Inpatient level of care appropriate due to severity of illness   Dispo: The patient is from: Home              Anticipated d/c is to: SNF              Patient currently is medically stable to d/c.              Difficult to place patient No                 Family Communication:   husband updated 8/5. He has requested that all information be communicated to him through his advocate Ed strader [phone]. He also reported that patient had been declared incompetent and that her husband is her legal gaurdian   Consultants: Pulmonology   Code Status:  FULL   DVT Prophylaxis:  [place] Heparin     Procedures: As Listed in Progress Note Above    Antibiotics: Cefepime 7/30>>8/1    Subjective: Feels better. Denies shortness of breath. She is still very weak   Objective: Vitals:   04/29/21 0736 04/29/21 0750 04/29/21 0910 04/29/21 1600  BP:   (!) 111/57 134/90  Pulse:   100 99  Resp:      Temp:  99.4 F (37.4 C)    TempSrc:      SpO2: 98%  93% 95%  Weight:      Height:        Intake/Output Summary (Last 24 hours) at 04/29/2021 2010 Last data filed at 04/29/2021 1800 Gross per 24 hour  Intake 1280 ml  Output 400 ml  Net 880 ml   Weight change:  Exam:  General exam: Alert, awake, oriented x 3 Respiratory system: Clear to auscultation. Respiratory effort normal. Cardiovascular system:RRR. No murmurs, rubs, gallops. Gastrointestinal system: Abdomen is nondistended, soft and nontender. No organomegaly or masses felt. Normal bowel sounds heard. Central nervous system: Alert and oriented. No focal neurological deficits. Extremities: No C/C/E, +pedal pulses Skin: No rashes, lesions or ulcers Psychiatry: Judgement and insight appear  normal. Mood & affect appropriate.     Data Reviewed: I have personally reviewed following labs and imaging studies Basic Metabolic Panel: Recent Labs  Lab 04/23/21 0430 04/24/21 0444 04/25/21 0435 04/25/21 2149 04/26/21 0355 04/26/21 2154 04/27/21 0453 04/28/21 0329 04/29/21 0413  NA 146* 145 140  --  139  --  138 136  --   K 4.9 4.5 4.4  --  4.9  --  4.2 4.0  --   CL 108 106 99  --  99  --  93* 92*  --   CO2 --  31  --  33* 33*  --   GLUCOSE 126* 73 214* 353* 289* 382* 278* 275*  --   BUN 42* 28* 26*  --  26*  --  25* 26*  --   CREATININE 1.71* 1.35* 1.30*  --  1.13*  --  1.27* 1.39* 1.18*  CALCIUM 8.8* 8.8* 8.5*  --  8.6*  --  8.7* 8.8*  --   MG 2.0  --   --   --   --   --   --   --   --   PHOS  --   --   --   --   --   --   --  2.6  --    Liver Function Tests: Recent Labs  Lab 04/23/21 0430 04/26/21 0355 04/28/21 0329  AST 22 20  --   ALT 40 24  --    ALKPHOS 118 74  --   BILITOT 0.9 1.1  --   PROT 6.0* 5.6*  --   ALBUMIN 3.4* 2.8* 2.9*   No results for input(s): LIPASE, AMYLASE in the last 168 hours. No results for input(s): AMMONIA in the last 168 hours.  Coagulation Profile: No results for input(s): INR, PROTIME in the last 168 hours.  CBC: Recent Labs  Lab 04/23/21 0430 04/24/21 0444 04/25/21 0435 04/26/21 0355 04/27/21 0453  WBC 9.9 6.6 5.8 5.0 5.4  HGB 15.2* 14.3 14.3 13.7 13.9  HCT 53.8* 48.0* 47.7* 46.4* 45.6  MCV 96.9 95.2 92.8 92.8 90.1  PLT 231 195 174 178 173   Cardiac Enzymes: No results for input(s): CKTOTAL, CKMB, CKMBINDEX, TROPONINI in the last 168 hours. BNP: Invalid input(s): POCBNP CBG: Recent Labs  Lab 04/28/21 1702 04/28/21 2151 04/29/21 0743 04/29/21 1217 04/29/21 1649  GLUCAP 291* 352* 228* 270* 225*   HbA1C: No results for input(s): HGBA1C in the last 72 hours. Urine analysis:    Component Value Date/Time   COLORURINE YELLOW (A) 05/01/2017 1852   APPEARANCEUR CLEAR (A) 05/01/2017 1852   LABSPEC 1.023 05/01/2017 1852   PHURINE 5.0 05/01/2017 1852   GLUCOSEU >=500 (A) 05/01/2017 1852   HGBUR NEGATIVE 05/01/2017 1852   BILIRUBINUR NEGATIVE 05/01/2017 1852   KETONESUR NEGATIVE 05/01/2017 1852   PROTEINUR 100 (A) 05/01/2017 1852   UROBILINOGEN 0.2 04/11/2014 1927   NITRITE NEGATIVE 05/01/2017 1852   LEUKOCYTESUR NEGATIVE 05/01/2017 1852   Sepsis Labs: (procalcitonin:4,lacticidven:4) ) Recent Results (from the past 240 hour(s))  Blood culture (routine single)     Status: None   Collection Time: 04/22/21  8:55 AM   Specimen: BLOOD  Result Value Ref Range Status   Specimen Description BLOOD RIGHT HAND  Final   Special Requests   Final    BOTTLES DRAWN AEROBIC AND ANAEROBIC Blood Culture results may not be optimal due to an inadequate volume of blood received in culture bottles   Culture  Final    NO GROWTH 5 DAYS Performed at [facility], [address],  [place], [place] [address]    Report Status 04/27/2021 FINAL  Final  Resp Panel by RT-PCR (Flu A&B, Covid) Nasopharyngeal Swab     Status: None   Collection Time: 04/22/21  9:27 AM   Specimen: Nasopharyngeal Swab; Nasopharyngeal(NP) swabs in vial transport medium  Result Value Ref Range Status   SARS Coronavirus 2 by RT PCR NEGATIVE NEGATIVE Final    Comment: (NOTE) SARS-CoV-2 target nucleic acids are NOT DETECTED.  The SARS-CoV-2 RNA is generally detectable in upper respiratory specimens during the acute phase of infection. The lowest concentration of SARS-CoV-2 viral copies this assay can detect is 138 copies/mL. A negative result does not preclude SARS-Cov-2 infection and should not be used as the sole basis for treatment or other patient management decisions. A negative result may occur with  improper specimen collection/handling, submission of specimen other than nasopharyngeal swab, presence of viral mutation(s) within the areas targeted by this assay, and inadequate number of viral copies(<138 copies/mL). A negative result must be combined with clinical observations, patient history, and epidemiological information. The expected result is Negative.  Fact Sheet for Patients:  BloggerCourse.com  Fact Sheet for Healthcare Providers:  SeriousBroker.it  This test is no t yet approved or cleared by the [place] FDA and  has been authorized for detection and/or diagnosis of SARS-CoV-2 by FDA under an Emergency Use Authorization (EUA). This EUA will remain  in effect (meaning this test can be used) for the duration of the COVID-19 declaration under Section 564(b)(1) of the Act, 21 U.S.C.section 360bbb-3(b)(1), unless the authorization is terminated  or revoked sooner.       Influenza A by PCR NEGATIVE NEGATIVE Final   Influenza B by PCR NEGATIVE NEGATIVE Final    Comment: (NOTE) The Xpert Xpress SARS-CoV-2/FLU/RSV plus assay is  intended as an aid in the diagnosis of influenza from Nasopharyngeal swab specimens and should not be used as a sole basis for treatment. Nasal washings and aspirates are unacceptable for Xpert Xpress SARS-CoV-2/FLU/RSV testing.  Fact Sheet for Patients: BloggerCourse.com  Fact Sheet for Healthcare Providers: SeriousBroker.it  This test is not yet approved or cleared by the [place] FDA and has been authorized for detection and/or diagnosis of SARS-CoV-2 by FDA under an Emergency Use Authorization (EUA). This EUA will remain in effect (meaning this test can be used) for the duration of the COVID-19 declaration under Section 564(b)(1) of the Act, 21 U.S.C. section 360bbb-3(b)(1), unless the authorization is terminated or revoked.  Performed at [facility], [address] [place], [place] [address]   MRSA Next Gen by PCR, Nasal     Status: None   Collection Time: 04/23/21 11:37 AM   Specimen: Nasal Mucosa; Nasal Swab  Result Value Ref Range Status   MRSA by PCR Next Gen NOT DETECTED NOT DETECTED Final    Comment: (NOTE) The GeneXpert MRSA Assay (FDA approved for NASAL specimens only), is one component of a comprehensive MRSA colonization surveillance program. It is not intended to diagnose MRSA infection nor to guide or monitor treatment for MRSA infections. Test performance is not FDA approved in patients less than 80 years old. Performed at [facility], [address], [place], [place] [address]   Urine Culture     Status: None   Collection Time: 04/23/21  6:05 PM   Specimen: Urine, Clean Catch  Result Value Ref Range Status   Specimen Description  Final    URINE, CLEAN CATCH Performed at [facility], [address], [place], [place] [address]    Special Requests   Final    NONE Performed at [facility], [address], [place], [place] [address]    Culture   Final    NO GROWTH Performed at [facility], [address], [place], [place] [address]    Report Status 04/25/2021 FINAL  Final  Respiratory (~20 pathogens) panel by PCR     Status: None   Collection Time: 04/25/21  8:05 PM   Specimen: Nasopharyngeal Swab; Respiratory  Result Value Ref Range Status   Adenovirus NOT DETECTED NOT DETECTED Final   Coronavirus 229E NOT DETECTED NOT DETECTED Final    Comment: (NOTE) The Coronavirus on the Respiratory Panel, DOES NOT test for the novel  Coronavirus (2019 nCoV)    Coronavirus HKU1 NOT DETECTED NOT DETECTED Final   Coronavirus NL63 NOT DETECTED NOT DETECTED Final   Coronavirus OC43 NOT DETECTED NOT DETECTED Final   Metapneumovirus NOT DETECTED NOT DETECTED Final   Rhinovirus / Enterovirus NOT DETECTED NOT DETECTED Final   Influenza A NOT DETECTED NOT DETECTED Final   Influenza B NOT DETECTED NOT DETECTED Final   Parainfluenza Virus 1 NOT DETECTED NOT DETECTED Final   Parainfluenza Virus 2 NOT DETECTED NOT DETECTED Final   Parainfluenza Virus 3 NOT DETECTED NOT DETECTED Final   Parainfluenza Virus 4 NOT DETECTED NOT DETECTED Final   Respiratory Syncytial Virus NOT DETECTED NOT DETECTED Final   Bordetella pertussis NOT DETECTED NOT DETECTED Final   Bordetella Parapertussis NOT DETECTED NOT DETECTED Final   Chlamydophila pneumoniae NOT DETECTED NOT DETECTED Final   Mycoplasma pneumoniae NOT DETECTED NOT DETECTED Final    Comment: Performed at [facility], [address] [place], [place] [address]     Scheduled Meds:  ARIPiprazole  30 mg Oral Daily   aspirin EC  81 mg Oral q morning   budesonide (PULMICORT) nebulizer solution  0.25 mg Nebulization BID   Chlorhexidine Gluconate Cloth  6 each Topical Daily   clonazePAM  0.5 mg Oral BID   clotrimazole  10 mg Oral 5 X Daily   docusate sodium  100 mg Oral TID   heparin  5,000 Units Subcutaneous Q8H   insulin aspart  0-15 Units Subcutaneous TID WC   insulin aspart  0-5 Units Subcutaneous QHS   insulin aspart  15 Units  Subcutaneous TID WC   insulin glargine-yfgn  40 Units Subcutaneous QHS   insulin glargine-yfgn  40 Units Subcutaneous Daily   isosorbide mononitrate  30 mg Oral Daily   mirabegron ER  25 mg Oral BID   nystatin   Topical BID   pravastatin  40 mg Oral q1800   propranolol  60 mg Oral Q1400   vitamin B-12  500 mcg Oral Daily   Continuous Infusions:  Procedures/Studies: CT ABDOMEN PELVIS WO CONTRAST  Result Date: 04/24/2021 CLINICAL DATA:  Abdominal pain and fever. Acute on chronic renal failure. Previous hysterectomy. EXAM: CT ABDOMEN AND PELVIS WITHOUT CONTRAST TECHNIQUE: Multidetector CT imaging of the abdomen and pelvis was performed following the standard protocol without IV contrast. COMPARISON:  04/11/2014 FINDINGS: Lower chest: No acute findings. Hepatobiliary: No mass visualized on this unenhanced exam. Gallbladder is unremarkable. No evidence of biliary ductal dilatation. Pancreas: No mass or inflammatory process visualized on this unenhanced exam. Spleen:  Within normal limits in size. Adrenals/Urinary tract: No evidence of urolithiasis or hydronephrosis. Increased symmetric bilateral  perinephric stranding is noted, and consistent with medical renal disease. Image degradation by motion artifact noted. Several lesions are seen in both kidneys which have increased in size since previous study, but cannot be characterized on this unenhanced exam. Urinary bladder is empty with Foley catheter in place. Stomach/Bowel: No evidence of obstruction, inflammatory process, or abnormal fluid collections. Vascular/Lymphatic: No pathologically enlarged lymph nodes identified. No evidence of abdominal aortic aneurysm. Aortic atherosclerotic calcification noted. Reproductive: Prior hysterectomy noted. Adnexal regions are unremarkable in appearance. Other: A small paraumbilical ventral hernia is again seen which contains only fat. New moderate diffuse body wall edema is also seen. Musculoskeletal:  No suspicious  bone lesions identified. IMPRESSION: No evidence of urolithiasis or hydronephrosis. Increased symmetric bilateral perinephric stranding, which is nonspecific but likely secondary to medical renal disease. Increased size of several bilateral renal lesions, which cannot be characterized on this unenhanced exam. Abdomen MRI is recommended for further characterization, preferably without and with contrast if renal function permits. New diffuse body wall edema. Stable small paraumbilical ventral hernia containing only fat. Electronically Signed   By: Danae Orleans M.D.   On: 04/24/2021 19:42   DG Chest 1 View  Result Date: 04/22/2021 CLINICAL DATA:  Fall today. PT c/o pain in pelvis and Rt shoulder/Rt upper chest since fall. Hx COPD, nonsmoker, diabetes, HTN, obesity EXAM: CHEST  1 VIEW COMPARISON:  04/15/2021 FINDINGS: Lungs are clear. Heart size upper limits normal. Aortic Atherosclerosis (ICD10-170.0). No effusion.  No pneumothorax. Right shoulder dislocation IMPRESSION: No acute cardiopulmonary disease. Right shoulder dislocation Electronically Signed   By: Corlis Leak M.D.   On: 04/22/2021 10:10   DG Pelvis 1-2 Views  Result Date: 04/22/2021 CLINICAL DATA:  Pain post fall EXAM: PELVIS - 1-2 VIEW COMPARISON:  CT 04/11/2014 FINDINGS: There is no evidence of pelvic fracture or diastasis. The right femoral neck is not well profiled. Chronic osteitis pubis. Degenerative changes in the lower lumbar spine. Surgical clips in the left lower quadrant. Bilateral pelvic phleboliths. No pelvic bone lesions are seen. IMPRESSION: No acute findings. Electronically Signed   By: Corlis Leak M.D.   On: 04/22/2021 10:12   DG Shoulder Right  Result Date: 04/25/2021 CLINICAL DATA:  Right shoulder reduction. EXAM: DG C-ARM 1-60 MIN; RIGHT SHOULDER - 2+ VIEW FLUOROSCOPY TIME:  Fluoroscopy Time:  11 seconds. Number of Acquired Spot Images: 3 COMPARISON:  04/24/2021. FINDINGS: Three C-arm fluoroscopic images were obtained  intraoperatively and submitted for post operative interpretation. These images demonstrate reduction of the right shoulder a which appears to be located but potentially slightly superiorly subluxed. Fracture fragment better characterized on prior radiographs. Please see the performing provider's procedural report for further detail. IMPRESSION: Intraoperative fluoroscopy, as detailed above. Electronically Signed   By: Feliberto Harts MD   On: 04/25/2021 15:47   DG Shoulder Right  Result Date: 04/24/2021 CLINICAL DATA:  Anterior shoulder dislocation patient reports dislocated right shoulder yesterday with continued pain. EXAM: RIGHT SHOULDER - 2+ VIEW COMPARISON:  Shoulder radiograph 04/22/2021. Procedural fluoroscopy 04/22/2021 during shoulder dislocation reduction. FINDINGS: Recurrent anterior dislocation of the humeral head with respect to the glenoid. Small displaced fracture fragments laterally, donor site may be the lateral humeral head, however fragments also project adjacent to the glenoid on the [place] view. IMPRESSION: Recurrent anterior shoulder dislocation. Small displaced fracture fragments laterally, donor site may be the lateral humeral head, however fragments also project adjacent to the glenoid on the [place] view. Electronically Signed   By: Ivette Loyal.D.  On: 04/24/2021 17:41   DG Shoulder Right  Result Date: 04/22/2021 CLINICAL DATA:  Right shoulder dislocation. EXAM: DG C-ARM 1-60 MIN; RIGHT SHOULDER - 2+ VIEW FLUOROSCOPY TIME:  Fluoroscopy Time:  48 seconds. Number of Acquired Spot Images: 3 COMPARISON:  04/22/2021. FINDINGS: Three C-arm fluoroscopic images were obtained intraoperatively and submitted for post operative interpretation. These images demonstrate closed reduction of the right shoulder. The shoulder appears to be located. Please see the performing provider's procedural report for further detail. IMPRESSION: Intraoperative fluoroscopy during shoulder reduction.  The right shoulder appears to be located. Post reduction radiographs could confirm and better evaluate for fracture. Electronically Signed   By: Feliberto Harts MD   On: 04/22/2021 16:25   DG Shoulder Right  Result Date: 04/22/2021 CLINICAL DATA:  Pain post fall EXAM: RIGHT SHOULDER - 2+ VIEW COMPARISON:  None. FINDINGS: Anterior right shoulder dislocation. Small cortical fragments project at the posterior margin of the glenoid. Proximal humerus appears intact. IMPRESSION: Anterior shoulder dislocation Electronically Signed   By: Corlis Leak M.D.   On: 04/22/2021 10:13   CT Head Wo Contrast  Result Date: 04/22/2021 CLINICAL DATA:  Larey Seat, pain EXAM: CT HEAD WITHOUT CONTRAST TECHNIQUE: Contiguous axial images were obtained from the base of the skull through the vertex without intravenous contrast. COMPARISON:  02/07/2012 FINDINGS: Brain: Mild parenchymal atrophy. Patchy areas of hypoattenuation in deep and periventricular white matter bilaterally. Negative for acute intracranial hemorrhage, mass lesion, acute infarction, midline shift, or mass-effect. Acute infarct may be inapparent on noncontrast CT. Ventricles and sulci symmetric. Vascular: No hyperdense vessel or unexpected calcification. Skull: Normal. Negative for fracture or focal lesion. Sinuses/Orbits: No acute finding. Other: None IMPRESSION: 1. Negative for bleed or other acute intracranial process. 2. Atrophy and nonspecific white matter changes. Electronically Signed   By: Corlis Leak M.D.   On: 04/22/2021 10:09   CT CHEST WO CONTRAST  Result Date: 04/22/2021 CLINICAL DATA:  Dyspnea, chronic, unclear etiology Respiratory illness, nondiagnostic xray EXAM: CT CHEST WITHOUT CONTRAST TECHNIQUE: Multidetector CT imaging of the chest was performed following the standard protocol without IV contrast. COMPARISON:  CT chest abdomen pelvis 04/11/2014 FINDINGS: Cardiovascular: Normal heart size. Trace pericardial effusion. The thoracic aorta is normal in  caliber. At least mild atherosclerotic plaque of the thoracic aorta. At least 2 vessel coronary artery calcifications. Question enlarged main pulmonary artery. Mediastinum/Nodes: No gross hilar adenopathy, noting limited sensitivity for the detection of hilar adenopathy on this noncontrast study. No enlarged mediastinal or axillary lymph nodes. Thyroid gland, trachea, and esophagus demonstrate no significant findings. Lungs/Pleura: Bilateral dependent posterior subsegmental atelectasis. Bilateral lower lobe base linear atelectasis versus scarring. No focal consolidation. Few scattered calcified and noncalcified pulmonary micronodules. No pulmonary mass. No pleural effusion. No pneumothorax. Upper Abdomen: There is a 1.6 cm fluid density lesion at the superior pole of the left kidney likely represents a simple renal cyst. Otherwise no acute abnormality. Musculoskeletal: No chest wall abnormality. No suspicious lytic or blastic osseous lesions. No acute displaced fracture. Multilevel degenerative changes of the spine. Old posterior left third rib fracture. IMPRESSION: 1. No acute intrathoracic abnormality with limited evaluation on this noncontrast study. 2. Question enlarged main pulmonary artery suggestive of pulmonary hypertension. 3. Aortic Atherosclerosis (ICD10-I70.0) including coronary calcifications. Electronically Signed   By: Tish Frederickson M.D.   On: 04/22/2021 20:52   CT Angio Chest Pulmonary Embolism (PE) W or WO Contrast  Result Date: 04/27/2021 CLINICAL DATA:  Acute on chronic respiratory failure with hypoxia, elevated d-dimer. EXAM: CT  ANGIOGRAPHY CHEST WITH CONTRAST TECHNIQUE: Multidetector CT imaging of the chest was performed using the standard protocol during bolus administration of intravenous contrast. Multiplanar CT image reconstructions and MIPs were obtained to evaluate the vascular anatomy. CONTRAST:  OMNIPAQUE IOHEXOL 350 MG/ML SOLN COMPARISON:  CT angiography chest 04/22/2021, CT  abdomen pelvis 04/24/2021 FINDINGS: Cardiovascular: Fairly satisfactory opacification of the pulmonary arteries to the segmental level. No evidence of pulmonary embolism. Normal heart size. No significant pericardial effusion. The thoracic aorta is normal in caliber. Mild atherosclerotic plaque of the thoracic aorta. Coronary artery calcifications. Mediastinum/Nodes: No enlarged mediastinal, hilar, or axillary lymph nodes. Thyroid gland, trachea, and esophagus demonstrate no significant findings. Lungs/Pleura: Bilateral lower lobe subsegmental atelectasis. No focal consolidation. No pulmonary nodule. No pulmonary mass. No pleural effusion. No pneumothorax. Upper Abdomen: Poorly visualized evaluated 2.1 cm density within left kidney with a no acute abnormality. Musculoskeletal: No abdominal wall hernia or abnormality. No suspicious lytic or blastic osseous lesions. No acute displaced fracture. Multilevel degenerative changes of the spine. Review of the MIP images confirms the above findings. IMPRESSION: 1. No central or proximal segmental pulmonary embolus. Limited evaluation more distally due to timing of contrast. 2. No acute intrathoracic abnormality. 3. Indeterminate 2.1 cm left renal lesion. Recommend MRI renal protocol for further evaluation. 4. Aortic Atherosclerosis (ICD10-I70.0) -including coronary artery calcifications. Electronically Signed   By: Tish Frederickson M.D.   On: 04/27/2021 23:47   US RENAL  Result Date: 04/22/2021 CLINICAL DATA:  Dyspnea.  Chronic stage III A renal disease. EXAM: RENAL / URINARY TRACT ULTRASOUND COMPLETE COMPARISON:  None. FINDINGS: Right Kidney: Renal measurements: 13.2 x 6.5 x 6.5 cm = volume: 292 mL. Echogenicity within normal limits. No mass or hydronephrosis visualized. Left Kidney: Limited evaluation. Renal measurements: 14.7 x 6.8 x 8.1 cm= volume: 423 mL. Echogenicity within normal limits. There is a 3.7 x 3.2 x 3.7 cm cystic lesion within the left kidney. No solid  mass or hydronephrosis visualized. Urinary bladder: Appears normal for degree of bladder distention. Other: None. IMPRESSION: Grossly unremarkable renal ultrasound with a 3.7 cm left simple renal cyst. Please note otherwise limited evaluation of the left kidney. Electronically Signed   By: Tish Frederickson M.D.   On: 04/22/2021 20:05   US Venous Img Lower Bilateral (DVT)  Result Date: 04/23/2021 CLINICAL DATA:  Chronic lower extremity pain and edema EXAM: BILATERAL LOWER EXTREMITY VENOUS DOPPLER ULTRASOUND TECHNIQUE: Gray-scale sonography with graded compression, as well as color Doppler and duplex ultrasound were performed to evaluate the lower extremity deep venous systems from the level of the common femoral vein and including the common femoral, femoral, profunda femoral, popliteal and calf veins including the posterior tibial, peroneal and gastrocnemius veins when visible. The superficial great saphenous vein was also interrogated. Spectral Doppler was utilized to evaluate flow at rest and with distal augmentation maneuvers in the common femoral, femoral and popliteal veins. COMPARISON:  None. FINDINGS: RIGHT LOWER EXTREMITY Common Femoral Vein: No evidence of thrombus. Normal compressibility, respiratory phasicity and response to augmentation. Saphenofemoral Junction: No evidence of thrombus. Normal compressibility and flow on color Doppler imaging. Profunda Femoral Vein: No evidence of thrombus. Normal compressibility and flow on color Doppler imaging. Femoral Vein: No evidence of thrombus. Normal compressibility, respiratory phasicity and response to augmentation. Popliteal Vein: No evidence of thrombus. Normal compressibility, respiratory phasicity and response to augmentation. Calf Veins: Limited visualization because of body habitus. Tibial peroneal veins appear grossly patent and compressible. LEFT LOWER EXTREMITY Common Femoral Vein: No evidence of  thrombus. Normal compressibility, respiratory  phasicity and response to augmentation. Saphenofemoral Junction: No evidence of thrombus. Normal compressibility and flow on color Doppler imaging. Profunda Femoral Vein: No evidence of thrombus. Normal compressibility and flow on color Doppler imaging. Femoral Vein: No evidence of thrombus. Normal compressibility, respiratory phasicity and response to augmentation. Popliteal Vein: No evidence of thrombus. Normal compressibility, respiratory phasicity and response to augmentation. Calf Veins: Again limited because of body habitus. Posterior tibial vein appears patent. Peroneal vein not visualized. IMPRESSION: No significant femoropopliteal DVT in either extremity. Limited assessment of the calf veins because of body habitus. Electronically Signed   By: Judie Petit.  Shick M.D.   On: 04/23/2021 08:02   DG CHEST PORT 1 VIEW  Result Date: 04/27/2021 CLINICAL DATA:  Short of breath, COPD EXAM: PORTABLE CHEST 1 VIEW COMPARISON:  04/23/2021 FINDINGS: Single frontal view of the chest demonstrates an enlarged cardiac silhouette. There is central vascular congestion without airspace disease, effusion, or pneumothorax. No acute bony abnormalities. IMPRESSION: 1. Central vascular congestion without overt edema. Electronically Signed   By: Sharlet Salina M.D.   On: 04/27/2021 16:20   DG CHEST PORT 1 VIEW  Result Date: 04/23/2021 CLINICAL DATA:  Shortness of breath.  Acute respiratory failure. EXAM: PORTABLE CHEST 1 VIEW COMPARISON:  April 22, 2021 FINDINGS: Stable cardiomegaly. The hila and mediastinum are not significantly changed given the low volume rotated portable technique. Increased interstitial markings bilaterally. Mild opacity in the bases, likely atelectasis. No other acute abnormalities. IMPRESSION: Findings are favored to represent cardiomegaly with pulmonary venous congestion/mild edema and bibasilar opacities, likely atelectasis. Electronically Signed   By: Gerome Sam III M.D   On: 04/23/2021 11:29   DG Chest  Port 1 View  Result Date: 04/15/2021 CLINICAL DATA:  sob EXAM: PORTABLE CHEST 1 VIEW COMPARISON:  April 16, 2014 FINDINGS: The cardiomediastinal silhouette is unchanged and enlarged in contour.Atherosclerotic calcifications. No pleural effusion. No pneumothorax. Perihilar vascular prominence without overt edema. Visualized abdomen is unremarkable. No acute osseous abnormality. IMPRESSION: No acute cardiopulmonary abnormality. Electronically Signed   By: Meda Klinefelter MD   On: 04/15/2021 15:55   DG C-Arm 1-60 Min  Result Date: 04/25/2021 CLINICAL DATA:  Right shoulder reduction. EXAM: DG C-ARM 1-60 MIN; RIGHT SHOULDER - 2+ VIEW FLUOROSCOPY TIME:  Fluoroscopy Time:  11 seconds. Number of Acquired Spot Images: 3 COMPARISON:  04/24/2021. FINDINGS: Three C-arm fluoroscopic images were obtained intraoperatively and submitted for post operative interpretation. These images demonstrate reduction of the right shoulder a which appears to be located but potentially slightly superiorly subluxed. Fracture fragment better characterized on prior radiographs. Please see the performing provider's procedural report for further detail. IMPRESSION: Intraoperative fluoroscopy, as detailed above. Electronically Signed   By: Feliberto Harts MD   On: 04/25/2021 15:47   DG C-Arm 1-60 Min  Result Date: 04/22/2021 CLINICAL DATA:  Right shoulder dislocation. EXAM: DG C-ARM 1-60 MIN; RIGHT SHOULDER - 2+ VIEW FLUOROSCOPY TIME:  Fluoroscopy Time:  48 seconds. Number of Acquired Spot Images: 3 COMPARISON:  04/22/2021. FINDINGS: Three C-arm fluoroscopic images were obtained intraoperatively and submitted for post operative interpretation. These images demonstrate closed reduction of the right shoulder. The shoulder appears to be located. Please see the performing provider's procedural report for further detail. IMPRESSION: Intraoperative fluoroscopy during shoulder reduction. The right shoulder appears to be located. Post reduction  radiographs could confirm and better evaluate for fracture. Electronically Signed   By: Feliberto Harts MD   On: 04/22/2021 16:25   ECHOCARDIOGRAM COMPLETE  Result Date: 04/24/2021    ECHOCARDIOGRAM REPORT   Patient Name:   Casey MannsJANET F Yeakle Date of Exam: 04/23/2021 Medical Rec #:  [id number]       Height:       68.0 in Accession #:    [id number]      Weight:       346.8 lb Date of Birth:  January 19, 1966       BSA:          2.582 m Patient Age:    55 years        BP:           160/67 mmHg Patient Gender: F               HR:           94 bpm. Exam Location:  Inpatient Procedure: 2D Echo, Cardiac Doppler and Color Doppler Indications:    Dyspnea R06.00  History:        Patient has prior history of Echocardiogram examinations, most                 recent 04/19/2014. COPD; Risk Factors:Hypertension and Diabetes.                 Parkinson's Disease. Seizures.  Sonographer:    Tiffany Dance Referring Phys: [id number] DAVID TAT  Sonographer Comments: Echo performed with patient supine and on artificial respirator and Technically difficult study due to poor echo windows. IMPRESSIONS  1. Left ventricular ejection fraction, by estimation, is 70 to 75%. The left ventricle has hyperdynamic function. The left ventricle has no regional wall motion abnormalities. There is mild left ventricular hypertrophy. Left ventricular diastolic parameters are indeterminate.  2. Right ventricular systolic function is mildly reduced. The right ventricular size is mildly enlarged. Tricuspid regurgitation signal is inadequate for assessing PA pressure.  3. Left atrial size was mildly dilated.  4. Right atrial size was mildly dilated.  5. A small pericardial effusion is present.  6. The mitral valve is degenerative. Trivial mitral valve regurgitation. Mild mitral stenosis. The mean mitral valve gradient is 4.6 mmHg with average heart rate of 95 bpm. Severe mitral annular calcification.  7. The aortic valve is grossly normal. There is mild calcification of  the aortic valve. Aortic valve regurgitation is not visualized. No aortic stenosis is present.  8. Increased flow velocities may be secondary to anemia, thyrotoxicosis, hyperdynamic or high flow state. 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 Left ventricular ejection fraction, by estimation, is 70 to 75%. The left ventricle has hyperdynamic function. The left ventricle has no regional wall motion abnormalities.  The left ventricular internal cavity size was normal in size. There is mild left ventricular hypertrophy. Left ventricular diastolic parameters are indeterminate. Right Ventricle: The right ventricular size is mildly enlarged. Right vetricular wall thickness was not well visualized. Right ventricular systolic function is mildly reduced. Tricuspid regurgitation signal is inadequate for assessing PA pressure. Left Atrium: Left atrial size was mildly dilated. Right Atrium: Right atrial size was mildly dilated. Pericardium: A small pericardial effusion is present. Mitral Valve: The mitral valve is degenerative in appearance. Severe mitral annular calcification. Trivial mitral valve regurgitation. Mild mitral valve stenosis. The mean mitral valve gradient is 4.6 mmHg with average heart rate of 95 bpm. Tricuspid Valve: The tricuspid valve is grossly normal. Tricuspid valve regurgitation is trivial. No evidence of tricuspid stenosis. Aortic Valve: The aortic valve is grossly normal. There is mild  calcification of the aortic valve. Aortic valve regurgitation is not visualized. No aortic stenosis is present. Pulmonic Valve: The pulmonic valve was grossly normal. Pulmonic valve regurgitation is trivial. No evidence of pulmonic stenosis. Aorta: The aortic root is normal in size and structure. Venous: The inferior vena cava was not well visualized. IVC assessment for right atrial pressure unable to be performed due to mechanical ventilation. IAS/Shunts: No atrial level  shunt detected by color flow Doppler.  LEFT VENTRICLE PLAX 2D LVIDd:         4.04 cm  Diastology LVIDs:         2.71 cm  LV e' medial:    6.22 cm/s LV PW:         1.30 cm  LV E/e' medial:  19.9 LV IVS:        1.11 cm  LV e' lateral:   9.20 cm/s LVOT diam:     1.90 cm  LV E/e' lateral: 13.5 LV SV:         106 LV SV Index:   41 LVOT Area:     2.84 cm  RIGHT VENTRICLE RV Basal diam:  3.98 cm RV Mid diam:    2.75 cm RV S prime:     16.20 cm/s TAPSE (M-mode): 2.0 cm LEFT ATRIUM             Index       RIGHT ATRIUM           Index LA diam:        3.30 cm 1.28 cm/m  RA Area:     18.60 cm LA Vol (A2C):   65.4 ml 25.33 ml/m RA Volume:   55.10 ml  21.34 ml/m LA Vol (A4C):   41.0 ml 15.88 ml/m LA Biplane Vol: 50.1 ml 19.40 ml/m  AORTIC VALVE LVOT Vmax:   181.00 cm/s LVOT Vmean:  124.000 cm/s LVOT VTI:    0.375 m  AORTA Ao Root diam: 3.10 cm Ao Asc diam:  3.50 cm MITRAL VALVE MV Area (PHT): 2.29 cm     SHUNTS MV Area VTI:   3.14 cm     Systemic VTI:  0.38 m MV Mean grad:  4.6 mmHg     Systemic Diam: 1.90 cm MV VTI:        0.34 m MV Decel Time: 331 msec MV E velocity: 124.00 cm/s MV A velocity: 144.00 cm/s MV E/A ratio:  0.86 Weston Brass MD Electronically signed by Weston Brass MD Signature Date/Time: 04/24/2021/9:10:32 AM    Final     Erick Blinks, MD  [facility]  If 7PM-7AM, please contact night-coverage [email or web address]  04/29/2021, 8:10 PM   LOS: 7 days

## 2021-04-29 NOTE — Plan of Care (Signed)

## 2021-04-29 NOTE — Progress Notes (Signed)
Patient's right arm sling removed with this RN and Larey Seat, RN as sling was soiled. Right arm was stabilized in position while sling was removed and new sling placed. Patient tolerated well with no complaints of worsening pain or discomfort.

## 2021-04-29 NOTE — Progress Notes (Signed)
Physical Therapy Treatment Patient Details Name: [name] MRN: [id number] DOB: 02-09-1966 Today's Date: 04/29/2021    History of Present Illness [name] is a 55 y.o. female with medical history of current impairment, bipolar disorder, seizure disorder, hypertension, COPD, chronic respiratory failure on 3 L, diabetes mellitus type 2, CKD stage III presenting with mechanical fall on the a.m. of 04/22/2021.  The patient is a difficult historian secondary to cognitive impairment.  History is also supplemented by the patient's spouse.  Unfortunately, he is also a difficult historian.  Nevertheless, it appears that the patient had a mechanical fall getting out of bed on the morning of 04/22/2021.  She has not had a history of frequent falls.  She last fell over 1 year prior to this admission.  There was no loss of consciousness this morning.  After the fall, the patient was complaining of pain all over.  As result, the patient was brought to emergency department for further evaluation.  Notably, the patient was noted to have a fever by EMS of 101.0 F.  She was given acetaminophen by EMS.  Prior to my assessment, the patient was noted to have an increasing oxygen requirement.  She was placed on 6 L nasal cannula.  Her spouse relates that the patient has been having more dyspnea on exertion over the past 3 to 4 weeks.  Apparently, she had a telehealth visit with her PCP a couple weeks prior to this admission.  She was given some type of medication which the spouse and patient do not recall.  There was some mild improvement in her breathing, but she complains of shortness of breath with exertion once again. Pt Rt shoulder was dislocated and recieved a closed reduction on 04/22/2021.    PT Comments    Patient agreeable for therapy.  Patient demonstrates slow labored movement for sitting up at bedside having to pull self to sitting using LUE and Mod assist, fair/good return for completing BLE  ROM/strengthening exercises while seated at bedside and requires repeated verbal/tactile cueing during transfer to chair with slow labored unsteady movement.  Patient tolerated sitting up in chair after therapy - RN aware.  Patient will benefit from continued physical therapy in hospital and recommended venue below to increase strength, balance, endurance for safe ADLs and gait.       Follow Up Recommendations  SNF;Supervision for mobility/OOB;Supervision - Intermittent     Equipment Recommendations  Other (comment) (to be determined)    Recommendations for Other Services       Precautions / Restrictions Precautions Precautions: Shoulder;Fall Type of Shoulder Precautions: dislocation Shoulder Interventions: Shoulder sling/immobilizer Restrictions Weight Bearing Restrictions: Yes RUE Weight Bearing: Non weight bearing    Mobility  Bed Mobility   Bed Mobility: Supine to Sit     Supine to sit: Mod assist     General bed mobility comments: increased time, labored movement    Transfers Overall transfer level: Needs assistance Equipment used: Rolling walker (2 wheeled) Transfers: Sit to/from [employer] Sit to Stand: Min assist Stand pivot transfers: Mod assist       General transfer comment: slow labored movement using LUE to hold onto RW  Ambulation/Gait Ambulation/Gait assistance: Mod assist;Max assist Gait Distance (Feet): 5 Feet Assistive device: Rolling walker (2 wheeled) Gait Pattern/deviations: Decreased step length - right;Decreased step length - left;Decreased stride length Gait velocity: decreased   General Gait Details: limited to a few slow labored side steps with difficulty advancing BLE due to c/o  weakness and extreme fear of falling   Stairs             Wheelchair Mobility    Modified Rankin (Stroke Patients Only)       Balance Overall balance assessment: Needs assistance Sitting-balance support: Feet supported;Single  extremity supported Sitting balance-Leahy Scale: Fair Sitting balance - Comments: fair/good seated at EOB   Standing balance support: During functional activity;Single extremity supported Standing balance-Leahy Scale: Poor Standing balance comment: fair/poor using LUE to hold onto RW                            Cognition Arousal/Alertness: Awake/alert Behavior During Therapy: [facility] for tasks assessed/performed;Anxious Overall Cognitive Status: Within Functional Limits for tasks assessed                                        Exercises General Exercises - Lower Extremity Ankle Circles/Pumps: AROM;Seated;Strengthening;Both;10 reps Long Arc Quad: Seated;AROM;Strengthening;Both;10 reps Hip Flexion/Marching: Seated;AROM;Strengthening;Both;10 reps    General Comments        Pertinent Vitals/Pain Pain Assessment: Faces Faces Pain Scale: Hurts a little bit Pain Location: right shoulder with any pressure/movement Pain Descriptors / Indicators: Sore;Grimacing;Guarding Pain Intervention(s): Limited activity within patient's tolerance;Monitored during session    Home Living                      Prior Function            PT Goals (current goals can now be found in the care plan section) Progress towards PT goals: Progressing toward goals    Frequency    Min 3X/week      PT Plan Current plan remains appropriate    Co-evaluation              AM-PAC PT "6 Clicks" Mobility   Outcome Measure  Help needed turning from your back to your side while in a flat bed without using bedrails?: A Lot Help needed moving from lying on your back to sitting on the side of a flat bed without using bedrails?: A Lot Help needed moving to and from a bed to a chair (including a wheelchair)?: A Lot Help needed standing up from a chair using your arms (e.g., wheelchair or bedside chair)?: A Lot Help needed to walk in hospital room?: A Lot Help needed  climbing 3-5 steps with a railing? : Total 6 Click Score: 11    End of Session Equipment Utilized During Treatment: Oxygen Activity Tolerance: Patient tolerated treatment well;Patient limited by fatigue Patient left: in chair;with call bell/phone within reach Nurse Communication: Mobility status PT Visit Diagnosis: Muscle weakness (generalized) (M62.81);History of falling (Z91.81);Pain;Unsteadiness on feet (R26.81) Pain - Right/Left: Right Pain - part of body: Shoulder     Time: 4580-9983 PT Time Calculation (min) (ACUTE ONLY): 28 min  Charges:  $Therapeutic Exercise: 8-22 mins $Therapeutic Activity: 8-22 mins                     4:11 PM, 04/29/21 Ocie Bob, MPT Physical Therapist with [facility] 336 [phone] office [phone] mobile phone

## 2021-04-29 NOTE — Telephone Encounter (Signed)
She is currently in hospital.  She will be discharged to SNF before going home.  Please schedule follow up appointment in 2 to 3 months with me.  She has asked that her appointment scheduling be coordinate through her patient advocate, Dierdre Highman at [phone].

## 2021-04-29 NOTE — TOC Progression Note (Signed)
Transition of Care [facility]) - Progression Note    Patient Details  Name: [name] MRN: [id number] Date of Birth: [birth year]  Transition of Care [facility]) CM/SW Contact  Barry Brunner, LCSW Phone Number: 04/29/2021, 4:31 PM  Clinical Narrative:    CSW contacted patient's friend listed as contact to inquire about DME delivery. Patient's friend reported that he did not believe patient was safe to discharge home and reported that he assist patient with majority of paperwork provided to government agencies and health care facilities. Patient's friend reported that the husband is unable to assist with transportation to doctor's appointments and is not able to assist with transfers. Patient's children are also unable to provide assistance due to proximity to patient and work requirements. CSW placed referral to [facility] facilities. Patient agreeable to sign over disability check during SNF stay. TOC to follow.    Expected Discharge Plan: Home/Self Care Barriers to Discharge: Continued Medical Work up  Expected Discharge Plan and Services Expected Discharge Plan: Home/Self Care       Living arrangements for the past 2 months: Single Family Home                           HH Arranged: PT, RN, Social Work [facility] Agency: [facility] Date HH Agency Contacted: 04/26/21 Time HH Agency Contacted: 1453 Representative spoke with at [facility] Agency: Alroy Bailiff   Social Determinants of Health (SDOH) Interventions    Readmission Risk Interventions Readmission Risk Prevention Plan 04/25/2021  Transportation Screening Complete  Home Care Screening Complete  Medication Review (RN CM) Complete  Some recent data might be hidden

## 2021-04-29 NOTE — Progress Notes (Signed)
Inpatient Diabetes Program Recommendations  AACE/ADA: New Consensus Statement on Inpatient Glycemic Control   Target Ranges:  Prepandial:   less than 140 mg/dL      Peak postprandial:   less than 180 mg/dL (1-2 hours)      Critically ill patients:  140 - 180 mg/dL   Results for [name], [name] (MRN [id number]) as of 04/29/2021 07:50  Ref. Range 04/28/2021 07:58 04/28/2021 12:21 04/28/2021 17:02 04/28/2021 21:51 04/29/2021 07:43  Glucose-Capillary Latest Ref Range: 70 - 99 mg/dL 753 (H) 005 (H) 110 (H) 352 (H) 228 (H)    Review of Glycemic Control  Diabetes history: DM2 Outpatient Diabetes medications: Lantus 55 units BID,  Victoza 1.8 mg daily in AM Current orders for Inpatient glycemic control: Semglee 25 units daily, Semglee 40 units QHS, Novolog 0-15 units TID with meals, Novolog 0-5 units QHS, Novolog 10 units TID with meals   Inpatient Diabetes Program Recommendations:     Insulin: Please consider increasing Semglee to 45 units QAM and Semglee 45 units QHS. Also, please consider increasing meal coverage to Novolog 15 units TID with meals if patient eats at least 50% of meals.   Thanks, Orlando Penner, RN, MSN, CDE Diabetes Coordinator Inpatient Diabetes Program [phone] (Team Pager from 8am to 5pm)

## 2021-04-29 NOTE — Consult Note (Signed)
[facility]   Patient name: [name] Admit date: 04/22/2021  DOB: 20-Sep-1966 LOS: 7  MRN: [id number] Consult date: 04/28/2021  Referring provider: Dr. Kerry Hough, [facility]: Hypoxia    History:  55 yo female tripped on oxygen cord at home and injured her shoulder.  She normally uses 3 liters oxygen.  She was to also have outpt sleep study scheduled.  She never smoked cigarettes and no prior PFT in system.  She developed increased O2 needs while in hospital.  PCCM asked to assist with respiratory management.  Past medical history:  Anxiety, DM type 2, HTN, Bipolar, HLD, Vit D deficiency, Neuropathy  Significant events:  7/29 admit, ortho consulted >> closed reduction Rt shoulder 7/30 start on Bipap at night  8/01 closed reduction Rt shoulder  Studies:  CT chest 04/22/21 >> atelectasis, few scattered calcified micronodules, enlarged PA Doppler legs b/l 04/23/21 >> no DVT Echo 04/23/21 >> EF 70 to 75%, mild LVH, mild RV enlargement CT angio chest 04/27/21 >> atelectasis  Micro:    Lines:     Antibiotics:    Consults:  Orthopedics    Interim history:  Breathing okay.  Not having cough.  Slept okay.  Vital signs:  BP (!) 111/57 (BP Location: Right Arm)   Pulse 100   Temp 99.4 F (37.4 C)   Resp (!) 28   Ht 5\' 8"  (1.727 m)   Wt (!) 154.8 kg   SpO2 93%   BMI 51.89 kg/m   Intake/output:  I/O last 3 completed shifts: In: -  Out: 1775 [Urine:1775]   Physical exam:   General - alert Eyes - pupils reactive ENT - no sinus tenderness, no stridor Cardiac - regular rate/rhythm, no murmur Chest - equal breath sounds b/l, no wheezing or rales Abdomen - soft, non tender, + bowel sounds Extremities - no cyanosis, clubbing, or edema, Rt arm in sling Skin - no rashes Neuro - normal strength, moves extremities, follows commands Psych - normal mood and behavior  Best practice:   DVT - SQ heparin SUP - Not  indicated Nutrition - Carb modified Mobility - PT   Assessment/plan:   Acute on chronic hypoxic and hypercapnic respiratory failure. - likely combination of atelectasis, volume overload, and sleep disordered breathing with obstructive sleep apnea and obesity hypoventilation syndrome - reported history of COPD, but she denies prior tobacco smoking; she does report clinical benefit from nebulizer therapy - goal SpO2 90 to 95% - can continue Bipap qhs while in hospital; will need to set up sleep testing as outpt - continue bronchial hygiene - mobilize as able - can continue pulmicort and duoneb for now; will need PFT as outpt to further assess for obstructive lung disease - work on weight reduction  Will have my office call to schedule follow up appointment in pulmonary office.  She has asked that this be coordinated through her patient advocate, Ed .  Resolved hospital problems:    Goals of care/Family discussions:  Code status: full code  Labs:   CMP Latest Ref Rng & Units 04/29/2021 04/28/2021 04/27/2021  Glucose 70 - 99 mg/dL - 06/27/2021) 983(J)  BUN 6 - 20 mg/dL - 825(K) 53(Z)  Creatinine 0.44 - 1.00 mg/dL 76(B) 3.41(P) 3.79(K)  Sodium 135 - 145 mmol/L - 136 138  Potassium 3.5 - 5.1 mmol/L - 4.0 4.2  Chloride 98 - 111 mmol/L - 92(L) 93(L)  CO2 22 - 32 mmol/L - 33(H) 33(H)  Calcium 8.9 - 10.3  mg/dL - 8.8(L) 8.7(L)  Total Protein 6.5 - 8.1 g/dL - - -  Total Bilirubin 0.3 - 1.2 mg/dL - - -  Alkaline Phos 38 - 126 U/L - - -  AST 15 - 41 U/L - - -  ALT 0 - 44 U/L - - -    CBC Latest Ref Rng & Units 04/27/2021 04/26/2021 04/25/2021  WBC 4.0 - 10.5 K/uL 5.4 5.0 5.8  Hemoglobin 12.0 - 15.0 g/dL 69.4 85.4 62.7  Hematocrit 36.0 - 46.0 % 45.6 46.4(H) 47.7(H)  Platelets 150 - 400 K/uL 173 178 174    ABG    Component Value Date/Time   PHART 7.281 (L) 04/23/2021 1538   PCO2ART 72.3 (HH) 04/23/2021 1538   PO2ART 85.0 04/23/2021 1538   HCO3 28.0 04/23/2021 1538   TCO2 40 04/15/2014  0941   O2SAT 94.6 04/23/2021 1538    CBG (last 3)  Recent Labs    04/28/21 2151 04/29/21 0743 04/29/21 1217  GLUCAP 352* 228* 270*     Signature:  Coralyn Helling, MD [facility] Pulmonary/Critical Care Pager - [phone] 04/29/2021, 2:17 PM

## 2021-04-30 LAB — GLUCOSE, CAPILLARY
Glucose-Capillary: 227 mg/dL — ABNORMAL HIGH (ref 70–99)
Glucose-Capillary: 231 mg/dL — ABNORMAL HIGH (ref 70–99)
Glucose-Capillary: 224 mg/dL — ABNORMAL HIGH (ref 70–99)
Glucose-Capillary: 228 mg/dL — ABNORMAL HIGH (ref 70–99)

## 2021-04-30 NOTE — Progress Notes (Signed)
PROGRESS NOTE  Casey MannsJanet F Shepherd [name] DOB: [birth year] DOA: 04/22/2021 [facility]   Brief History:   55 y.o. female with medical history of current impairment, bipolar disorder, seizure disorder, hypertension, COPD, chronic respiratory failure on 3 L, diabetes mellitus type 2, CKD stage III presenting with mechanical fall on the a.m. of 04/22/2021.  The patient is a difficult historian secondary to cognitive impairment.  History is also supplemented by the patient's spouse.  Unfortunately, he is also a difficult historian.  Nevertheless, it appears that the patient had a mechanical fall getting out of bed on the morning of 04/22/2021.  She has not had a history of frequent falls.  She last fell over 1 year prior to this admission.  There was no loss of consciousness this morning.  After the fall, the patient was complaining of pain all over.  As result, the patient was brought to emergency department for further evaluation.  Notably, the patient was noted to have a fever by EMS of 101.0 F.  She was given acetaminophen by EMS.  Prior to my assessment, the patient was noted to have an increasing oxygen requirement.  She was placed on 6 L nasal cannula.  Her spouse relates that the patient has been having more dyspnea on exertion over the past 3 to 4 weeks.  Apparently, she had a telehealth visit with her PCP a couple weeks prior to this admission.  She was given some type of medication which the spouse and patient do not recall.  There was some mild improvement in her breathing, but she complains of shortness of breath with exertion once again.  She denies any chest pain, headache, nausea, vomiting, diarrhea, abdominal pain.   04/23/21 10AM--I arrive to find patient somnolent but aroused to voice and answered yes/no to simple questions Patient stated she was thirsty but denied sob, cp. Upon checking oxygen saturation myself personally, she was 46%.  I checked  the nasal cannula in the patient's nose and followed the tubing the end of which was on the floor (not connected to the wall mounted oxygen). Then I looked at the wall mounted oxygen device which was connected to oxygen tubing.  I followed the oxygen tubing connected to the wall and it terminated (was connected) to a nebulizer mask which was sitting in a bag hanging on the wall. I connected the patient's nasal cannula tubing from her nose to the oxygen and increased it to 6L.  Over a period of 3-5 min.  Her saturation increased to 91-92%.  I then ordered STAT ABG and CXR and placed orders for transfer to SDU.   On 04/24/21, patient was being bathed and her sling was removed.  The patient complained of pain in her right shoulder again.  Repeat xray showed recurrent anterior dislocation.  Dr. Romeo AppleHarrison was notified and she was taken back to OR 04/25/21 to have another closed reduction under conscious sedation.     Assessment/Plan: Acute on chronic respiratory failure with hypoxia and hypercarbia -Obtain ABG 7.281/72/85/28 on 0.5 -Check BNP--606, improved to 73 -likely due to the patient's OSA/OHS and fluid overload -She has never smoked -Chronically on 3 L nasal cannula at home -04/23/21--placed on bipap due to somnolence and hypercarbia -Currently on 4 L -Goal is to keep saturation greater 90% -CT chest--bibasilar atelectasis, no infiltrates or effusions -Echo EF 70-75%, no WMA; mild decrease RV function, mild AS -continue duonebs -wean oxygen  for saturation >90% -8/2 Lasix 40 mg IV x 1 -D-dimer mildly elevated -CT angio chest negative for pulmonary embolism -Appreciate pulmonology input -Suspect that she is improving and probably approaching her baseline -Plans are to follow-up with pulmonology as an outpatient   Anterior right shoulder dislocation -Orthopedics, Dr. Romeo Apple consult appreciated -7/29--closed reduction of shoulder dislocation -minimize hypnotic meds -minimize opioids/pain  control -04/25/21--repeat closed reduction -Will need to keep his sling in place for at least 2 weeks -Follow-up with Dr. Romeo Apple in 2 weeks after discharge   Acute on chronic renal failure--CKD 3a -Difficult to clarify whether the patient has a degree of progression of underlying CKD -Previous baseline creatinine 1.0-1.4 -Renal ultrasound--no hydronephrosis -presented with creatinine of 2.4, now improved to 1.27   Fever -Check PCT--0.16 -Lactic acid 1.1 -Follow blood cultures--neg to date -Obtain UA and urine culture--neg -7/29 CT chest--no consolidation -obtain CT abd/pelvis--no acute findings -d/c abx and monitor -viral respiratory panel--neg -continue to follow for recurrence   Uncontrolled diabetes mellitus type 2 with hyperglycemia -Check A1c--8.5 -increase lantus to 40 units bid -She is also on NovoLog 15 units 3 times daily with meals -NovoLog sliding scale   Bipolar disorder -Minimize hypnotic medications -Continue Abilify, Cymbalta   Morbid Obesity -BMI 51.89 -lifestyle modification   Leg pain -venous duplex--no DVT   Essential Hypertension -Stable on Inderal and Imdur   Hyperlipidemia -restart statin   Low B12 -supplement po -give IM injection x 1  Tremor -chronically on propranolol -restart reduced dose klonopin      Status is: Inpatient   Remains inpatient appropriate because:Inpatient level of care appropriate due to severity of illness   Dispo: The patient is from: Home              Anticipated d/c is to: SNF              Patient currently is medically stable to d/c.              Difficult to place patient No                 Family Communication:   husband updated 8/5. He has requested that all information be communicated to him through his advocate Ed strader [phone]. He also reported that patient had been declared incompetent and that her husband is her legal gaurdian   Consultants: Pulmonology   Code Status:  FULL   DVT  Prophylaxis:  [place] Heparin     Procedures: As Listed in Progress Note Above   Antibiotics: Cefepime 7/30>>8/1    Subjective: No new complaints.   Objective: Vitals:   04/30/21 0322 04/30/21 0532 04/30/21 0755 04/30/21 1511  BP: 136/72 118/77  113/70  Pulse: 86 87  92  Resp: 17 18    Temp: 98.8 F (37.1 C) 98.8 F (37.1 C)  98.5 F (36.9 C)  TempSrc: Oral Oral  Oral  SpO2: 97% 97% 98% 92%  Weight:      Height:        Intake/Output Summary (Last 24 hours) at 04/30/2021 1841 Last data filed at 04/30/2021 1300 Gross per 24 hour  Intake 1360 ml  Output 500 ml  Net 860 ml   Weight change:  Exam:  General exam: Alert, awake, oriented x 3 Respiratory system: Clear to auscultation. Respiratory effort normal. Cardiovascular system:RRR. No murmurs, rubs, gallops. Gastrointestinal system: Abdomen is nondistended, soft and nontender. No organomegaly or masses felt. Normal bowel sounds heard. Central nervous system: Alert and oriented. No focal neurological  deficits. Extremities: No C/C/E, +pedal pulses Skin: No rashes, lesions or ulcers Psychiatry: Judgement and insight appear normal. Mood & affect appropriate.     Data Reviewed: I have personally reviewed following labs and imaging studies Basic Metabolic Panel: Recent Labs  Lab 04/24/21 0444 04/25/21 0435 04/25/21 2149 04/26/21 0355 04/26/21 2154 04/27/21 0453 04/28/21 0329 04/29/21 0413  NA 145 140  --  139  --  138 136  --   K 4.5 4.4  --  4.9  --  4.2 4.0  --   CL 106 99  --  99  --  93* 92*  --   CO2 31 30  --  31  --  33* 33*  --   GLUCOSE 73 214* 353* 289* 382* 278* 275*  --   BUN 28* 26*  --  26*  --  25* 26*  --   CREATININE 1.35* 1.30*  --  1.13*  --  1.27* 1.39* 1.18*  CALCIUM 8.8* 8.5*  --  8.6*  --  8.7* 8.8*  --   PHOS  --   --   --   --   --   --  2.6  --    Liver Function Tests: Recent Labs  Lab 04/26/21 0355 04/28/21 0329  AST 20  --   ALT 24  --   ALKPHOS 74  --   BILITOT 1.1  --    PROT 5.6*  --   ALBUMIN 2.8* 2.9*   No results for input(s): LIPASE, AMYLASE in the last 168 hours. No results for input(s): AMMONIA in the last 168 hours.  Coagulation Profile: No results for input(s): INR, PROTIME in the last 168 hours.  CBC: Recent Labs  Lab 04/24/21 0444 04/25/21 0435 04/26/21 0355 04/27/21 0453  WBC 6.6 5.8 5.0 5.4  HGB 14.3 14.3 13.7 13.9  HCT 48.0* 47.7* 46.4* 45.6  MCV 95.2 92.8 92.8 90.1  PLT 195 174 178 173   Cardiac Enzymes: No results for input(s): CKTOTAL, CKMB, CKMBINDEX, TROPONINI in the last 168 hours. BNP: Invalid input(s): POCBNP CBG: Recent Labs  Lab 04/29/21 1649 04/29/21 2102 04/30/21 0752 04/30/21 1121 04/30/21 1618  GLUCAP 225* 287* 227* 231* 224*   HbA1C: No results for input(s): HGBA1C in the last 72 hours. Urine analysis:    Component Value Date/Time   COLORURINE YELLOW (A) 05/01/2017 1852   APPEARANCEUR CLEAR (A) 05/01/2017 1852   LABSPEC 1.023 05/01/2017 1852   PHURINE 5.0 05/01/2017 1852   GLUCOSEU >=500 (A) 05/01/2017 1852   HGBUR NEGATIVE 05/01/2017 1852   BILIRUBINUR NEGATIVE 05/01/2017 1852   KETONESUR NEGATIVE 05/01/2017 1852   PROTEINUR 100 (A) 05/01/2017 1852   UROBILINOGEN 0.2 04/11/2014 1927   NITRITE NEGATIVE 05/01/2017 1852   LEUKOCYTESUR NEGATIVE 05/01/2017 1852   Sepsis Labs: (procalcitonin:4,lacticidven:4) ) Recent Results (from the past 240 hour(s))  Blood culture (routine single)     Status: None   Collection Time: 04/22/21  8:55 AM   Specimen: BLOOD  Result Value Ref Range Status   Specimen Description BLOOD RIGHT HAND  Final   Special Requests   Final    BOTTLES DRAWN AEROBIC AND ANAEROBIC Blood Culture results may not be optimal due to an inadequate volume of blood received in culture bottles   Culture   Final    NO GROWTH 5 DAYS Performed at [facility], [address] [place], [place] [address]    Report Status 04/27/2021 FINAL  Final  Resp Panel by RT-PCR (Flu A&B,  Covid) Nasopharyngeal Swab     Status: None   Collection Time: 04/22/21  9:27 AM   Specimen: Nasopharyngeal Swab; Nasopharyngeal(NP) swabs in vial transport medium  Result Value Ref Range Status   SARS Coronavirus 2 by RT PCR NEGATIVE NEGATIVE Final    Comment: (NOTE) SARS-CoV-2 target nucleic acids are NOT DETECTED.  The SARS-CoV-2 RNA is generally detectable in upper respiratory specimens during the acute phase of infection. The lowest concentration of SARS-CoV-2 viral copies this assay can detect is 138 copies/mL. A negative result does not preclude SARS-Cov-2 infection and should not be used as the sole basis for treatment or other patient management decisions. A negative result may occur with  improper specimen collection/handling, submission of specimen other than nasopharyngeal swab, presence of viral mutation(s) within the areas targeted by this assay, and inadequate number of viral copies(<138 copies/mL). A negative result must be combined with clinical observations, patient history, and epidemiological information. The expected result is Negative.  Fact Sheet for Patients:  BloggerCourse.com  Fact Sheet for Healthcare Providers:  SeriousBroker.it  This test is no t yet approved or cleared by the [place] FDA and  has been authorized for detection and/or diagnosis of SARS-CoV-2 by FDA under an Emergency Use Authorization (EUA). This EUA will remain  in effect (meaning this test can be used) for the duration of the COVID-19 declaration under Section 564(b)(1) of the Act, 21 U.S.C.section 360bbb-3(b)(1), unless the authorization is terminated  or revoked sooner.       Influenza A by PCR NEGATIVE NEGATIVE Final   Influenza B by PCR NEGATIVE NEGATIVE Final    Comment: (NOTE) The Xpert Xpress SARS-CoV-2/FLU/RSV plus assay is intended as an aid in the diagnosis of influenza from Nasopharyngeal swab specimens and should  not be used as a sole basis for treatment. Nasal washings and aspirates are unacceptable for Xpert Xpress SARS-CoV-2/FLU/RSV testing.  Fact Sheet for Patients: BloggerCourse.com  Fact Sheet for Healthcare Providers: SeriousBroker.it  This test is not yet approved or cleared by the [place] FDA and has been authorized for detection and/or diagnosis of SARS-CoV-2 by FDA under an Emergency Use Authorization (EUA). This EUA will remain in effect (meaning this test can be used) for the duration of the COVID-19 declaration under Section 564(b)(1) of the Act, 21 U.S.C. section 360bbb-3(b)(1), unless the authorization is terminated or revoked.  Performed at [facility], [address], [place], [place] [address]   MRSA Next Gen by PCR, Nasal     Status: None   Collection Time: 04/23/21 11:37 AM   Specimen: Nasal Mucosa; Nasal Swab  Result Value Ref Range Status   MRSA by PCR Next Gen NOT DETECTED NOT DETECTED Final    Comment: (NOTE) The GeneXpert MRSA Assay (FDA approved for NASAL specimens only), is one component of a comprehensive MRSA colonization surveillance program. It is not intended to diagnose MRSA infection nor to guide or monitor treatment for MRSA infections. Test performance is not FDA approved in patients less than 69 years old. Performed at [facility], [address] [place], [place] [address]   Urine Culture     Status: None   Collection Time: 04/23/21  6:05 PM   Specimen: Urine, Clean Catch  Result Value Ref Range Status   Specimen Description   Final    URINE, CLEAN CATCH Performed at [facility], [address] [place], [place] [address]    Special Requests   Final    NONE Performed at [facility], [address], [place], [place] [address]    Culture   Final    NO GROWTH Performed at [facility], [address], [place], [place] [address]    Report Status 04/25/2021 FINAL  Final  Respiratory  (~20 pathogens) panel by PCR     Status: None   Collection Time: 04/25/21  8:05 PM   Specimen: Nasopharyngeal Swab; Respiratory  Result Value Ref Range Status   Adenovirus NOT DETECTED NOT DETECTED Final   Coronavirus 229E NOT DETECTED NOT DETECTED Final    Comment: (NOTE) The Coronavirus on the Respiratory Panel, DOES NOT test for the novel  Coronavirus (2019 nCoV)    Coronavirus HKU1 NOT DETECTED NOT DETECTED Final   Coronavirus NL63 NOT DETECTED NOT DETECTED Final   Coronavirus OC43 NOT DETECTED NOT DETECTED Final   Metapneumovirus NOT DETECTED NOT DETECTED Final   Rhinovirus / Enterovirus NOT DETECTED NOT DETECTED Final   Influenza A NOT DETECTED NOT DETECTED Final   Influenza B NOT DETECTED NOT DETECTED Final   Parainfluenza Virus 1 NOT DETECTED NOT DETECTED Final   Parainfluenza Virus 2 NOT DETECTED NOT DETECTED Final   Parainfluenza Virus 3 NOT DETECTED NOT DETECTED Final   Parainfluenza Virus 4 NOT DETECTED NOT DETECTED Final   Respiratory Syncytial Virus NOT DETECTED NOT DETECTED Final   Bordetella pertussis NOT DETECTED NOT DETECTED Final   Bordetella Parapertussis NOT DETECTED NOT DETECTED Final   Chlamydophila pneumoniae NOT DETECTED NOT DETECTED Final   Mycoplasma pneumoniae NOT DETECTED NOT DETECTED Final    Comment: Performed at [facility], [address], [place], [place] [address]     Scheduled Meds:  ARIPiprazole  30 mg Oral Daily   aspirin EC  81 mg Oral q morning   budesonide (PULMICORT) nebulizer solution  0.25 mg Nebulization BID   Chlorhexidine Gluconate Cloth  6 each Topical Daily   clonazePAM  0.5 mg Oral BID   clotrimazole  10 mg Oral 5 X Daily   docusate sodium  100 mg Oral TID   heparin  5,000 Units Subcutaneous Q8H   insulin aspart  0-15 Units Subcutaneous TID WC   insulin aspart  0-5 Units Subcutaneous QHS   insulin aspart  15 Units Subcutaneous TID WC   insulin glargine-yfgn  40 Units Subcutaneous QHS   insulin glargine-yfgn  40 Units  Subcutaneous Daily   isosorbide mononitrate  30 mg Oral Daily   mirabegron ER  25 mg Oral BID   nystatin   Topical BID   pravastatin  40 mg Oral q1800   propranolol  60 mg Oral Q1400   vitamin B-12  500 mcg Oral Daily   Continuous Infusions:  Procedures/Studies: CT ABDOMEN PELVIS WO CONTRAST  Result Date: 04/24/2021 CLINICAL DATA:  Abdominal pain and fever. Acute on chronic renal failure. Previous hysterectomy. EXAM: CT ABDOMEN AND PELVIS WITHOUT CONTRAST TECHNIQUE: Multidetector CT imaging of the abdomen and pelvis was performed following the standard protocol without IV contrast. COMPARISON:  04/11/2014 FINDINGS: Lower chest: No acute findings. Hepatobiliary: No mass visualized on this unenhanced exam. Gallbladder is unremarkable. No evidence of biliary ductal dilatation. Pancreas: No mass or inflammatory process visualized on this unenhanced exam. Spleen:  Within normal limits in size. Adrenals/Urinary tract: No evidence of urolithiasis or hydronephrosis. Increased symmetric bilateral perinephric stranding is noted, and consistent with medical renal disease. Image degradation by motion artifact noted. Several lesions are seen in both kidneys which have increased in size since previous study, but cannot be characterized on  this unenhanced exam. Urinary bladder is empty with Foley catheter in place. Stomach/Bowel: No evidence of obstruction, inflammatory process, or abnormal fluid collections. Vascular/Lymphatic: No pathologically enlarged lymph nodes identified. No evidence of abdominal aortic aneurysm. Aortic atherosclerotic calcification noted. Reproductive: Prior hysterectomy noted. Adnexal regions are unremarkable in appearance. Other: A small paraumbilical ventral hernia is again seen which contains only fat. New moderate diffuse body wall edema is also seen. Musculoskeletal:  No suspicious bone lesions identified. IMPRESSION: No evidence of urolithiasis or hydronephrosis. Increased symmetric  bilateral perinephric stranding, which is nonspecific but likely secondary to medical renal disease. Increased size of several bilateral renal lesions, which cannot be characterized on this unenhanced exam. Abdomen MRI is recommended for further characterization, preferably without and with contrast if renal function permits. New diffuse body wall edema. Stable small paraumbilical ventral hernia containing only fat. Electronically Signed   By: Danae Orleans M.D.   On: 04/24/2021 19:42   DG Chest 1 View  Result Date: 04/22/2021 CLINICAL DATA:  Fall today. PT c/o pain in pelvis and Rt shoulder/Rt upper chest since fall. Hx COPD, nonsmoker, diabetes, HTN, obesity EXAM: CHEST  1 VIEW COMPARISON:  04/15/2021 FINDINGS: Lungs are clear. Heart size upper limits normal. Aortic Atherosclerosis (ICD10-170.0). No effusion.  No pneumothorax. Right shoulder dislocation IMPRESSION: No acute cardiopulmonary disease. Right shoulder dislocation Electronically Signed   By: Corlis Leak M.D.   On: 04/22/2021 10:10   DG Pelvis 1-2 Views  Result Date: 04/22/2021 CLINICAL DATA:  Pain post fall EXAM: PELVIS - 1-2 VIEW COMPARISON:  CT 04/11/2014 FINDINGS: There is no evidence of pelvic fracture or diastasis. The right femoral neck is not well profiled. Chronic osteitis pubis. Degenerative changes in the lower lumbar spine. Surgical clips in the left lower quadrant. Bilateral pelvic phleboliths. No pelvic bone lesions are seen. IMPRESSION: No acute findings. Electronically Signed   By: Corlis Leak M.D.   On: 04/22/2021 10:12   DG Shoulder Right  Result Date: 04/25/2021 CLINICAL DATA:  Right shoulder reduction. EXAM: DG C-ARM 1-60 MIN; RIGHT SHOULDER - 2+ VIEW FLUOROSCOPY TIME:  Fluoroscopy Time:  11 seconds. Number of Acquired Spot Images: 3 COMPARISON:  04/24/2021. FINDINGS: Three C-arm fluoroscopic images were obtained intraoperatively and submitted for post operative interpretation. These images demonstrate reduction of the right  shoulder a which appears to be located but potentially slightly superiorly subluxed. Fracture fragment better characterized on prior radiographs. Please see the performing provider's procedural report for further detail. IMPRESSION: Intraoperative fluoroscopy, as detailed above. Electronically Signed   By: Feliberto Harts MD   On: 04/25/2021 15:47   DG Shoulder Right  Result Date: 04/24/2021 CLINICAL DATA:  Anterior shoulder dislocation patient reports dislocated right shoulder yesterday with continued pain. EXAM: RIGHT SHOULDER - 2+ VIEW COMPARISON:  Shoulder radiograph 04/22/2021. Procedural fluoroscopy 04/22/2021 during shoulder dislocation reduction. FINDINGS: Recurrent anterior dislocation of the humeral head with respect to the glenoid. Small displaced fracture fragments laterally, donor site may be the lateral humeral head, however fragments also project adjacent to the glenoid on the [place] view. IMPRESSION: Recurrent anterior shoulder dislocation. Small displaced fracture fragments laterally, donor site may be the lateral humeral head, however fragments also project adjacent to the glenoid on the [place] view. Electronically Signed   By: Narda Rutherford M.D.   On: 04/24/2021 17:41   DG Shoulder Right  Result Date: 04/22/2021 CLINICAL DATA:  Right shoulder dislocation. EXAM: DG C-ARM 1-60 MIN; RIGHT SHOULDER - 2+ VIEW FLUOROSCOPY TIME:  Fluoroscopy Time:  48 seconds.  Number of Acquired Spot Images: 3 COMPARISON:  04/22/2021. FINDINGS: Three C-arm fluoroscopic images were obtained intraoperatively and submitted for post operative interpretation. These images demonstrate closed reduction of the right shoulder. The shoulder appears to be located. Please see the performing provider's procedural report for further detail. IMPRESSION: Intraoperative fluoroscopy during shoulder reduction. The right shoulder appears to be located. Post reduction radiographs could confirm and better evaluate for fracture.  Electronically Signed   By: Feliberto Harts MD   On: 04/22/2021 16:25   DG Shoulder Right  Result Date: 04/22/2021 CLINICAL DATA:  Pain post fall EXAM: RIGHT SHOULDER - 2+ VIEW COMPARISON:  None. FINDINGS: Anterior right shoulder dislocation. Small cortical fragments project at the posterior margin of the glenoid. Proximal humerus appears intact. IMPRESSION: Anterior shoulder dislocation Electronically Signed   By: Corlis Leak M.D.   On: 04/22/2021 10:13   CT Head Wo Contrast  Result Date: 04/22/2021 CLINICAL DATA:  Larey Seat, pain EXAM: CT HEAD WITHOUT CONTRAST TECHNIQUE: Contiguous axial images were obtained from the base of the skull through the vertex without intravenous contrast. COMPARISON:  02/07/2012 FINDINGS: Brain: Mild parenchymal atrophy. Patchy areas of hypoattenuation in deep and periventricular white matter bilaterally. Negative for acute intracranial hemorrhage, mass lesion, acute infarction, midline shift, or mass-effect. Acute infarct may be inapparent on noncontrast CT. Ventricles and sulci symmetric. Vascular: No hyperdense vessel or unexpected calcification. Skull: Normal. Negative for fracture or focal lesion. Sinuses/Orbits: No acute finding. Other: None IMPRESSION: 1. Negative for bleed or other acute intracranial process. 2. Atrophy and nonspecific white matter changes. Electronically Signed   By: Corlis Leak M.D.   On: 04/22/2021 10:09   CT CHEST WO CONTRAST  Result Date: 04/22/2021 CLINICAL DATA:  Dyspnea, chronic, unclear etiology Respiratory illness, nondiagnostic xray EXAM: CT CHEST WITHOUT CONTRAST TECHNIQUE: Multidetector CT imaging of the chest was performed following the standard protocol without IV contrast. COMPARISON:  CT chest abdomen pelvis 04/11/2014 FINDINGS: Cardiovascular: Normal heart size. Trace pericardial effusion. The thoracic aorta is normal in caliber. At least mild atherosclerotic plaque of the thoracic aorta. At least 2 vessel coronary artery calcifications.  Question enlarged main pulmonary artery. Mediastinum/Nodes: No gross hilar adenopathy, noting limited sensitivity for the detection of hilar adenopathy on this noncontrast study. No enlarged mediastinal or axillary lymph nodes. Thyroid gland, trachea, and esophagus demonstrate no significant findings. Lungs/Pleura: Bilateral dependent posterior subsegmental atelectasis. Bilateral lower lobe base linear atelectasis versus scarring. No focal consolidation. Few scattered calcified and noncalcified pulmonary micronodules. No pulmonary mass. No pleural effusion. No pneumothorax. Upper Abdomen: There is a 1.6 cm fluid density lesion at the superior pole of the left kidney likely represents a simple renal cyst. Otherwise no acute abnormality. Musculoskeletal: No chest wall abnormality. No suspicious lytic or blastic osseous lesions. No acute displaced fracture. Multilevel degenerative changes of the spine. Old posterior left third rib fracture. IMPRESSION: 1. No acute intrathoracic abnormality with limited evaluation on this noncontrast study. 2. Question enlarged main pulmonary artery suggestive of pulmonary hypertension. 3. Aortic Atherosclerosis (ICD10-I70.0) including coronary calcifications. Electronically Signed   By: Tish Frederickson M.D.   On: 04/22/2021 20:52   CT Angio Chest Pulmonary Embolism (PE) W or WO Contrast  Result Date: 04/27/2021 CLINICAL DATA:  Acute on chronic respiratory failure with hypoxia, elevated d-dimer. EXAM: CT ANGIOGRAPHY CHEST WITH CONTRAST TECHNIQUE: Multidetector CT imaging of the chest was performed using the standard protocol during bolus administration of intravenous contrast. Multiplanar CT image reconstructions and MIPs were obtained to evaluate the vascular anatomy.  CONTRAST:  OMNIPAQUE IOHEXOL 350 MG/ML SOLN COMPARISON:  CT angiography chest 04/22/2021, CT abdomen pelvis 04/24/2021 FINDINGS: Cardiovascular: Fairly satisfactory opacification of the pulmonary arteries to the  segmental level. No evidence of pulmonary embolism. Normal heart size. No significant pericardial effusion. The thoracic aorta is normal in caliber. Mild atherosclerotic plaque of the thoracic aorta. Coronary artery calcifications. Mediastinum/Nodes: No enlarged mediastinal, hilar, or axillary lymph nodes. Thyroid gland, trachea, and esophagus demonstrate no significant findings. Lungs/Pleura: Bilateral lower lobe subsegmental atelectasis. No focal consolidation. No pulmonary nodule. No pulmonary mass. No pleural effusion. No pneumothorax. Upper Abdomen: Poorly visualized evaluated 2.1 cm density within left kidney with a no acute abnormality. Musculoskeletal: No abdominal wall hernia or abnormality. No suspicious lytic or blastic osseous lesions. No acute displaced fracture. Multilevel degenerative changes of the spine. Review of the MIP images confirms the above findings. IMPRESSION: 1. No central or proximal segmental pulmonary embolus. Limited evaluation more distally due to timing of contrast. 2. No acute intrathoracic abnormality. 3. Indeterminate 2.1 cm left renal lesion. Recommend MRI renal protocol for further evaluation. 4. Aortic Atherosclerosis (ICD10-I70.0) -including coronary artery calcifications. Electronically Signed   By: Tish Frederickson M.D.   On: 04/27/2021 23:47   US RENAL  Result Date: 04/22/2021 CLINICAL DATA:  Dyspnea.  Chronic stage III A renal disease. EXAM: RENAL / URINARY TRACT ULTRASOUND COMPLETE COMPARISON:  None. FINDINGS: Right Kidney: Renal measurements: 13.2 x 6.5 x 6.5 cm = volume: 292 mL. Echogenicity within normal limits. No mass or hydronephrosis visualized. Left Kidney: Limited evaluation. Renal measurements: 14.7 x 6.8 x 8.1 cm= volume: 423 mL. Echogenicity within normal limits. There is a 3.7 x 3.2 x 3.7 cm cystic lesion within the left kidney. No solid mass or hydronephrosis visualized. Urinary bladder: Appears normal for degree of bladder distention. Other: None.  IMPRESSION: Grossly unremarkable renal ultrasound with a 3.7 cm left simple renal cyst. Please note otherwise limited evaluation of the left kidney. Electronically Signed   By: Tish Frederickson M.D.   On: 04/22/2021 20:05   US Venous Img Lower Bilateral (DVT)  Result Date: 04/23/2021 CLINICAL DATA:  Chronic lower extremity pain and edema EXAM: BILATERAL LOWER EXTREMITY VENOUS DOPPLER ULTRASOUND TECHNIQUE: Gray-scale sonography with graded compression, as well as color Doppler and duplex ultrasound were performed to evaluate the lower extremity deep venous systems from the level of the common femoral vein and including the common femoral, femoral, profunda femoral, popliteal and calf veins including the posterior tibial, peroneal and gastrocnemius veins when visible. The superficial great saphenous vein was also interrogated. Spectral Doppler was utilized to evaluate flow at rest and with distal augmentation maneuvers in the common femoral, femoral and popliteal veins. COMPARISON:  None. FINDINGS: RIGHT LOWER EXTREMITY Common Femoral Vein: No evidence of thrombus. Normal compressibility, respiratory phasicity and response to augmentation. Saphenofemoral Junction: No evidence of thrombus. Normal compressibility and flow on color Doppler imaging. Profunda Femoral Vein: No evidence of thrombus. Normal compressibility and flow on color Doppler imaging. Femoral Vein: No evidence of thrombus. Normal compressibility, respiratory phasicity and response to augmentation. Popliteal Vein: No evidence of thrombus. Normal compressibility, respiratory phasicity and response to augmentation. Calf Veins: Limited visualization because of body habitus. Tibial peroneal veins appear grossly patent and compressible. LEFT LOWER EXTREMITY Common Femoral Vein: No evidence of thrombus. Normal compressibility, respiratory phasicity and response to augmentation. Saphenofemoral Junction: No evidence of thrombus. Normal compressibility and  flow on color Doppler imaging. Profunda Femoral Vein: No evidence of thrombus. Normal compressibility and flow on  color Doppler imaging. Femoral Vein: No evidence of thrombus. Normal compressibility, respiratory phasicity and response to augmentation. Popliteal Vein: No evidence of thrombus. Normal compressibility, respiratory phasicity and response to augmentation. Calf Veins: Again limited because of body habitus. Posterior tibial vein appears patent. Peroneal vein not visualized. IMPRESSION: No significant femoropopliteal DVT in either extremity. Limited assessment of the calf veins because of body habitus. Electronically Signed   By: Judie Petit.  Shick M.D.   On: 04/23/2021 08:02   DG CHEST PORT 1 VIEW  Result Date: 04/27/2021 CLINICAL DATA:  Short of breath, COPD EXAM: PORTABLE CHEST 1 VIEW COMPARISON:  04/23/2021 FINDINGS: Single frontal view of the chest demonstrates an enlarged cardiac silhouette. There is central vascular congestion without airspace disease, effusion, or pneumothorax. No acute bony abnormalities. IMPRESSION: 1. Central vascular congestion without overt edema. Electronically Signed   By: Sharlet Salina M.D.   On: 04/27/2021 16:20   DG CHEST PORT 1 VIEW  Result Date: 04/23/2021 CLINICAL DATA:  Shortness of breath.  Acute respiratory failure. EXAM: PORTABLE CHEST 1 VIEW COMPARISON:  April 22, 2021 FINDINGS: Stable cardiomegaly. The hila and mediastinum are not significantly changed given the low volume rotated portable technique. Increased interstitial markings bilaterally. Mild opacity in the bases, likely atelectasis. No other acute abnormalities. IMPRESSION: Findings are favored to represent cardiomegaly with pulmonary venous congestion/mild edema and bibasilar opacities, likely atelectasis. Electronically Signed   By: Gerome Sam III M.D   On: 04/23/2021 11:29   DG Chest Port 1 View  Result Date: 04/15/2021 CLINICAL DATA:  sob EXAM: PORTABLE CHEST 1 VIEW COMPARISON:  April 16, 2014  FINDINGS: The cardiomediastinal silhouette is unchanged and enlarged in contour.Atherosclerotic calcifications. No pleural effusion. No pneumothorax. Perihilar vascular prominence without overt edema. Visualized abdomen is unremarkable. No acute osseous abnormality. IMPRESSION: No acute cardiopulmonary abnormality. Electronically Signed   By: Meda Klinefelter MD   On: 04/15/2021 15:55   DG C-Arm 1-60 Min  Result Date: 04/25/2021 CLINICAL DATA:  Right shoulder reduction. EXAM: DG C-ARM 1-60 MIN; RIGHT SHOULDER - 2+ VIEW FLUOROSCOPY TIME:  Fluoroscopy Time:  11 seconds. Number of Acquired Spot Images: 3 COMPARISON:  04/24/2021. FINDINGS: Three C-arm fluoroscopic images were obtained intraoperatively and submitted for post operative interpretation. These images demonstrate reduction of the right shoulder a which appears to be located but potentially slightly superiorly subluxed. Fracture fragment better characterized on prior radiographs. Please see the performing provider's procedural report for further detail. IMPRESSION: Intraoperative fluoroscopy, as detailed above. Electronically Signed   By: Feliberto Harts MD   On: 04/25/2021 15:47   DG C-Arm 1-60 Min  Result Date: 04/22/2021 CLINICAL DATA:  Right shoulder dislocation. EXAM: DG C-ARM 1-60 MIN; RIGHT SHOULDER - 2+ VIEW FLUOROSCOPY TIME:  Fluoroscopy Time:  48 seconds. Number of Acquired Spot Images: 3 COMPARISON:  04/22/2021. FINDINGS: Three C-arm fluoroscopic images were obtained intraoperatively and submitted for post operative interpretation. These images demonstrate closed reduction of the right shoulder. The shoulder appears to be located. Please see the performing provider's procedural report for further detail. IMPRESSION: Intraoperative fluoroscopy during shoulder reduction. The right shoulder appears to be located. Post reduction radiographs could confirm and better evaluate for fracture. Electronically Signed   By: Feliberto Harts MD   On:  04/22/2021 16:25   ECHOCARDIOGRAM COMPLETE  Result Date: 04/24/2021    ECHOCARDIOGRAM REPORT   Patient Name:   DENAJA VERHOEVEN Date of Exam: 04/23/2021 Medical Rec #:  [id number]       Height:  68.0 in Accession #:    [id number]      Weight:       346.8 lb Date of Birth:  Jul 11, 1966       BSA:          2.582 m Patient Age:    55 years        BP:           160/67 mmHg Patient Gender: F               HR:           94 bpm. Exam Location:  Inpatient Procedure: 2D Echo, Cardiac Doppler and Color Doppler Indications:    Dyspnea R06.00  History:        Patient has prior history of Echocardiogram examinations, most                 recent 04/19/2014. COPD; Risk Factors:Hypertension and Diabetes.                 Parkinson's Disease. Seizures.  Sonographer:    Tiffany Dance Referring Phys: [phone] DAVID TAT  Sonographer Comments: Echo performed with patient supine and on artificial respirator and Technically difficult study due to poor echo windows. IMPRESSIONS  1. Left ventricular ejection fraction, by estimation, is 70 to 75%. The left ventricle has hyperdynamic function. The left ventricle has no regional wall motion abnormalities. There is mild left ventricular hypertrophy. Left ventricular diastolic parameters are indeterminate.  2. Right ventricular systolic function is mildly reduced. The right ventricular size is mildly enlarged. Tricuspid regurgitation signal is inadequate for assessing PA pressure.  3. Left atrial size was mildly dilated.  4. Right atrial size was mildly dilated.  5. A small pericardial effusion is present.  6. The mitral valve is degenerative. Trivial mitral valve regurgitation. Mild mitral stenosis. The mean mitral valve gradient is 4.6 mmHg with average heart rate of 95 bpm. Severe mitral annular calcification.  7. The aortic valve is grossly normal. There is mild calcification of the aortic valve. Aortic valve regurgitation is not visualized. No aortic stenosis is present.  8. Increased flow  velocities may be secondary to anemia, thyrotoxicosis, hyperdynamic or high flow state. 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 Left ventricular ejection fraction, by estimation, is 70 to 75%. The left ventricle has hyperdynamic function. The left ventricle has no regional wall motion abnormalities.  The left ventricular internal cavity size was normal in size. There is mild left ventricular hypertrophy. Left ventricular diastolic parameters are indeterminate. Right Ventricle: The right ventricular size is mildly enlarged. Right vetricular wall thickness was not well visualized. Right ventricular systolic function is mildly reduced. Tricuspid regurgitation signal is inadequate for assessing PA pressure. Left Atrium: Left atrial size was mildly dilated. Right Atrium: Right atrial size was mildly dilated. Pericardium: A small pericardial effusion is present. Mitral Valve: The mitral valve is degenerative in appearance. Severe mitral annular calcification. Trivial mitral valve regurgitation. Mild mitral valve stenosis. The mean mitral valve gradient is 4.6 mmHg with average heart rate of 95 bpm. Tricuspid Valve: The tricuspid valve is grossly normal. Tricuspid valve regurgitation is trivial. No evidence of tricuspid stenosis. Aortic Valve: The aortic valve is grossly normal. There is mild calcification of the aortic valve. Aortic valve regurgitation is not visualized. No aortic stenosis is present. Pulmonic Valve: The pulmonic valve was grossly normal. Pulmonic valve regurgitation is trivial. No evidence of pulmonic stenosis. Aorta: The aortic root is  normal in size and structure. Venous: The inferior vena cava was not well visualized. IVC assessment for right atrial pressure unable to be performed due to mechanical ventilation. IAS/Shunts: No atrial level shunt detected by color flow Doppler.  LEFT VENTRICLE PLAX 2D LVIDd:         4.04 cm  Diastology LVIDs:          2.71 cm  LV e' medial:    6.22 cm/s LV PW:         1.30 cm  LV E/e' medial:  19.9 LV IVS:        1.11 cm  LV e' lateral:   9.20 cm/s LVOT diam:     1.90 cm  LV E/e' lateral: 13.5 LV SV:         106 LV SV Index:   41 LVOT Area:     2.84 cm  RIGHT VENTRICLE RV Basal diam:  3.98 cm RV Mid diam:    2.75 cm RV S prime:     16.20 cm/s TAPSE (M-mode): 2.0 cm LEFT ATRIUM             Index       RIGHT ATRIUM           Index LA diam:        3.30 cm 1.28 cm/m  RA Area:     18.60 cm LA Vol (A2C):   65.4 ml 25.33 ml/m RA Volume:   55.10 ml  21.34 ml/m LA Vol (A4C):   41.0 ml 15.88 ml/m LA Biplane Vol: 50.1 ml 19.40 ml/m  AORTIC VALVE LVOT Vmax:   181.00 cm/s LVOT Vmean:  124.000 cm/s LVOT VTI:    0.375 m  AORTA Ao Root diam: 3.10 cm Ao Asc diam:  3.50 cm MITRAL VALVE MV Area (PHT): 2.29 cm     SHUNTS MV Area VTI:   3.14 cm     Systemic VTI:  0.38 m MV Mean grad:  4.6 mmHg     Systemic Diam: 1.90 cm MV VTI:        0.34 m MV Decel Time: 331 msec MV E velocity: 124.00 cm/s MV A velocity: 144.00 cm/s MV E/A ratio:  0.86 Weston Brass MD Electronically signed by Weston Brass MD Signature Date/Time: 04/24/2021/9:10:32 AM    Final     Erick Blinks, MD  [facility]  If 7PM-7AM, please contact night-coverage [email or web address]  04/30/2021, 6:41 PM   LOS: 8 days

## 2021-04-30 NOTE — TOC Progression Note (Signed)
Transition of Care [facility]) - Progression Note    Patient Details  Name: CHANIN FRUMKIN MRN: [id number] Date of Birth: 1965-09-26  [facility] CM/SW Contact  Barry Brunner, LCSW Phone Number: 04/30/2021, 3:28 PM  Clinical Narrative:    Patient and family reported [facility] would be their preference. Revonda Standard with [facility] reported that they were most likely not able to make a bed offer due to patient's potential need for LTC. TOC to follow.    Expected Discharge Plan: Home/Self Care Barriers to Discharge: Continued Medical Work up  Expected Discharge Plan and Services Expected Discharge Plan: Home/Self Care       Living arrangements for the past 2 months: Single Family Home                           HH Arranged: PT, RN, Social Work [facility] Agency: [facility] Date HH Agency Contacted: 04/26/21 Time HH Agency Contacted: [phone] Representative spoke with at [facility] Agency: Alroy Bailiff   Social Determinants of Health (SDOH) Interventions    Readmission Risk Interventions Readmission Risk Prevention Plan 04/25/2021  Transportation Screening Complete  Home Care Screening Complete  Medication Review (RN CM) Complete  Some recent data might be hidden

## 2021-05-01 LAB — GLUCOSE, CAPILLARY
Glucose-Capillary: 200 mg/dL — ABNORMAL HIGH (ref 70–99)
Glucose-Capillary: 282 mg/dL — ABNORMAL HIGH (ref 70–99)
Glucose-Capillary: 181 mg/dL — ABNORMAL HIGH (ref 70–99)
Glucose-Capillary: 182 mg/dL — ABNORMAL HIGH (ref 70–99)

## 2021-05-01 NOTE — Plan of Care (Signed)
  Problem: Education: Goal: Knowledge of General Education information will improve Description Including pain rating scale, medication(s)/side effects and non-pharmacologic comfort measures Outcome: Progressing   

## 2021-05-01 NOTE — TOC Progression Note (Signed)
Transition of Care [facility]) - Progression Note    Patient Details  Name: [name] MRN: [id number] Date of Birth: [birth year]  Transition of Care [facility]) CM/SW Contact  Barry Brunner, LCSW Phone Number: 05/01/2021, 4:39 PM  Clinical Narrative:    Tonya with [facility] made bed offer for patient. CSW notified family friend. Erin reported that they will be able to take patient if agreeable to 30 days and if vaccinated for Covid 19 with at least 1st booster within six months. [place] reported that they are working to obtain bariatric bed for patient. TOC to follow.   Expected Discharge Plan: Home/Self Care Barriers to Discharge: Continued Medical Work up  Expected Discharge Plan and Services Expected Discharge Plan: Home/Self Care       Living arrangements for the past 2 months: Single Family Home                           HH Arranged: PT, RN, Social Work [facility] Agency: [facility] Date HH Agency Contacted: 04/26/21 Time HH Agency Contacted: [phone] Representative spoke with at [facility] Agency: Alroy Bailiff   Social Determinants of Health (SDOH) Interventions    Readmission Risk Interventions Readmission Risk Prevention Plan 04/25/2021  Transportation Screening Complete  Home Care Screening Complete  Medication Review (RN CM) Complete  Some recent data might be hidden

## 2021-05-01 NOTE — Progress Notes (Signed)
PROGRESS NOTE  [name] VZS:[id number] DOB: 1966-03-10 DOA: 04/22/2021 PCP: [facility]   Brief History:   55 y.o. female with medical history of current impairment, bipolar disorder, seizure disorder, hypertension, COPD, chronic respiratory failure on 3 L, diabetes mellitus type 2, CKD stage III presenting with mechanical fall on the a.m. of 04/22/2021.  The patient is a difficult historian secondary to cognitive impairment.  History is also supplemented by the patient's spouse.  Unfortunately, he is also a difficult historian.  Nevertheless, it appears that the patient had a mechanical fall getting out of bed on the morning of 04/22/2021.  She has not had a history of frequent falls.  She last fell over 1 year prior to this admission.  There was no loss of consciousness this morning.  After the fall, the patient was complaining of pain all over.  As result, the patient was brought to emergency department for further evaluation.  Notably, the patient was noted to have a fever by EMS of 101.0 F.  She was given acetaminophen by EMS.  Prior to my assessment, the patient was noted to have an increasing oxygen requirement.  She was placed on 6 L nasal cannula.  Her spouse relates that the patient has been having more dyspnea on exertion over the past 3 to 4 weeks.  Apparently, she had a telehealth visit with her PCP a couple weeks prior to this admission.  She was given some type of medication which the spouse and patient do not recall.  There was some mild improvement in her breathing, but she complains of shortness of breath with exertion once again.  She denies any chest pain, headache, nausea, vomiting, diarrhea, abdominal pain.   04/23/21 10AM--I arrive to find patient somnolent but aroused to voice and answered yes/no to simple questions Patient stated she was thirsty but denied sob, cp. Upon checking oxygen saturation myself personally, she was 46%.  I checked  the nasal cannula in the patient's nose and followed the tubing the end of which was on the floor (not connected to the wall mounted oxygen). Then I looked at the wall mounted oxygen device which was connected to oxygen tubing.  I followed the oxygen tubing connected to the wall and it terminated (was connected) to a nebulizer mask which was sitting in a bag hanging on the wall. I connected the patient's nasal cannula tubing from her nose to the oxygen and increased it to 6L.  Over a period of 3-5 min.  Her saturation increased to 91-92%.  I then ordered STAT ABG and CXR and placed orders for transfer to SDU.   On 04/24/21, patient was being bathed and her sling was removed.  The patient complained of pain in her right shoulder again.  Repeat xray showed recurrent anterior dislocation.  Dr. Romeo Apple was notified and she was taken back to OR 04/25/21 to have another closed reduction under conscious sedation.     Assessment/Plan: Acute on chronic respiratory failure with hypoxia and hypercarbia -Obtain ABG 7.281/72/85/28 on 0.5 -Check BNP--606, improved to 73 -likely due to the patient's OSA/OHS, atelectasis and fluid overload -She has never smoked -Chronically on 3 L nasal cannula at home -04/23/21--placed on bipap due to somnolence and hypercarbia -Currently on 4 L -Goal is to keep saturation greater 90% -CT chest--bibasilar atelectasis, no infiltrates or effusions -Echo EF 70-75%, no WMA; mild decrease RV function, mild AS -continue duonebs -wean  oxygen for saturation >90% -8/2 Lasix 40 mg IV x 1 -D-dimer mildly elevated -CT angio chest negative for pulmonary embolism -Appreciate pulmonology input -Suspect that she is improving and probably approaching her baseline -Plans are to follow-up with pulmonology as an outpatient to be considered for sleep study, PFTs   Anterior right shoulder dislocation -Orthopedics, Dr. Romeo Apple consult appreciated -7/29--closed reduction of shoulder  dislocation -minimize hypnotic meds -minimize opioids/pain control -04/25/21--repeat closed reduction -Will need to keep sling in place for at least 2 weeks -Follow-up with Dr. Romeo Apple in 2 weeks after discharge   Acute on chronic renal failure--CKD 3a -Difficult to clarify whether the patient has a degree of progression of underlying CKD -Previous baseline creatinine 1.0-1.4 -Renal ultrasound--no hydronephrosis -presented with creatinine of 2.4, now improved to 1.27   Fever -Check PCT--0.16 -Lactic acid 1.1 -Follow blood cultures--neg to date -Obtain UA and urine culture--neg -7/29 CT chest--no consolidation -obtain CT abd/pelvis--no acute findings -d/c abx and monitor -viral respiratory panel--neg -continue to follow for recurrence   Uncontrolled diabetes mellitus type 2 with hyperglycemia -Check A1c--8.5 -increase lantus to 40 units bid -She is also on NovoLog 15 units 3 times daily with meals -NovoLog sliding scale -Blood sugars currently stable   Bipolar disorder -Minimize hypnotic medications -Continue Abilify, Cymbalta   Morbid Obesity -BMI 51.89 -lifestyle modification   Leg pain -venous duplex--no DVT   Essential Hypertension -Stable on Inderal and Imdur   Hyperlipidemia -Continue statin   Low B12 -supplement po -give IM injection x 1  Tremor -chronically on propranolol -restarted reduced dose klonopin      Status is: Inpatient   Remains inpatient appropriate because:Inpatient level of care appropriate due to severity of illness   Dispo: The patient is from: Home              Anticipated d/c is to: SNF              Patient currently is medically stable to d/c.              Difficult to place patient No                 Family Communication:   husband updated 8/5. He has requested that all information be communicated to him through his advocate Ed strader [phone]. He also reported that patient had been declared incompetent and that  her husband is her legal gaurdian   Consultants: Pulmonology   Code Status:  FULL   DVT Prophylaxis:  [place] Heparin     Procedures: As Listed in Progress Note Above   Antibiotics: Cefepime 7/30>>8/1    Subjective: No shortness of breath, feels pain is managed.   Objective: Vitals:   05/01/21 0736 05/01/21 0830 05/01/21 1501 05/01/21 1947  BP:  125/79 100/63   Pulse:  (!) 109 86   Resp:      Temp:   98.2 F (36.8 C)   TempSrc:   Oral   SpO2: 93% 93% 96% 98%  Weight:      Height:        Intake/Output Summary (Last 24 hours) at 05/01/2021 2003 Last data filed at 05/01/2021 1700 Gross per 24 hour  Intake 2280 ml  Output 800 ml  Net 1480 ml   Weight change:  Exam:  General exam: Alert, awake, oriented x 3 Respiratory system: Clear to auscultation. Respiratory effort normal. Cardiovascular system:RRR. No murmurs, rubs, gallops. Gastrointestinal system: Abdomen is nondistended, soft and nontender. No organomegaly or masses felt. Normal bowel  sounds heard. Central nervous system: Alert and oriented. No focal neurological deficits. Extremities: Right arm is in sling Skin: No rashes, lesions or ulcers Psychiatry: Judgement and insight appear normal. Mood & affect appropriate.      Data Reviewed: I have personally reviewed following labs and imaging studies Basic Metabolic Panel: Recent Labs  Lab 04/25/21 0435 04/25/21 2149 04/26/21 0355 04/26/21 2154 04/27/21 0453 04/28/21 0329 04/29/21 0413  NA 140  --  139  --  138 136  --   K 4.4  --  4.9  --  4.2 4.0  --   CL 99  --  99  --  93* 92*  --   CO2 30  --  31  --  33* 33*  --   GLUCOSE 214* 353* 289* 382* 278* 275*  --   BUN 26*  --  26*  --  25* 26*  --   CREATININE 1.30*  --  1.13*  --  1.27* 1.39* 1.18*  CALCIUM 8.5*  --  8.6*  --  8.7* 8.8*  --   PHOS  --   --   --   --   --  2.6  --    Liver Function Tests: Recent Labs  Lab 04/26/21 0355 04/28/21 0329  AST 20  --   ALT 24  --   ALKPHOS 74  --    BILITOT 1.1  --   PROT 5.6*  --   ALBUMIN 2.8* 2.9*   No results for input(s): LIPASE, AMYLASE in the last 168 hours. No results for input(s): AMMONIA in the last 168 hours.  Coagulation Profile: No results for input(s): INR, PROTIME in the last 168 hours.  CBC: Recent Labs  Lab 04/25/21 0435 04/26/21 0355 04/27/21 0453  WBC 5.8 5.0 5.4  HGB 14.3 13.7 13.9  HCT 47.7* 46.4* 45.6  MCV 92.8 92.8 90.1  PLT 174 178 173   Cardiac Enzymes: No results for input(s): CKTOTAL, CKMB, CKMBINDEX, TROPONINI in the last 168 hours. BNP: Invalid input(s): POCBNP CBG: Recent Labs  Lab 04/30/21 1618 04/30/21 2300 05/01/21 0751 05/01/21 1103 05/01/21 1553  GLUCAP 224* 228* 200* 282* 181*   HbA1C: No results for input(s): HGBA1C in the last 72 hours. Urine analysis:    Component Value Date/Time   COLORURINE YELLOW (A) 05/01/2017 1852   APPEARANCEUR CLEAR (A) 05/01/2017 1852   LABSPEC 1.023 05/01/2017 1852   PHURINE 5.0 05/01/2017 1852   GLUCOSEU >=500 (A) 05/01/2017 1852   HGBUR NEGATIVE 05/01/2017 1852   BILIRUBINUR NEGATIVE 05/01/2017 1852   KETONESUR NEGATIVE 05/01/2017 1852   PROTEINUR 100 (A) 05/01/2017 1852   UROBILINOGEN 0.2 04/11/2014 1927   NITRITE NEGATIVE 05/01/2017 1852   LEUKOCYTESUR NEGATIVE 05/01/2017 1852   Sepsis Labs: @LABRCNTIP (procalcitonin:4,lacticidven:4) ) Recent Results (from the past 240 hour(s))  Blood culture (routine single)     Status: None   Collection Time: 04/22/21  8:55 AM   Specimen: BLOOD  Result Value Ref Range Status   Specimen Description BLOOD RIGHT HAND  Final   Special Requests   Final    BOTTLES DRAWN AEROBIC AND ANAEROBIC Blood Culture results may not be optimal due to an inadequate volume of blood received in culture bottles   Culture   Final    NO GROWTH 5 DAYS Performed at [facility], [address] [place], [place] [address]    Report Status 04/27/2021 FINAL  Final  Resp Panel by RT-PCR (Flu A&B, Covid) Nasopharyngeal  Swab  Status: None   Collection Time: 04/22/21  9:27 AM   Specimen: Nasopharyngeal Swab; Nasopharyngeal(NP) swabs in vial transport medium  Result Value Ref Range Status   SARS Coronavirus 2 by RT PCR NEGATIVE NEGATIVE Final    Comment: (NOTE) SARS-CoV-2 target nucleic acids are NOT DETECTED.  The SARS-CoV-2 RNA is generally detectable in upper respiratory specimens during the acute phase of infection. The lowest concentration of SARS-CoV-2 viral copies this assay can detect is 138 copies/mL. A negative result does not preclude SARS-Cov-2 infection and should not be used as the sole basis for treatment or other patient management decisions. A negative result may occur with  improper specimen collection/handling, submission of specimen other than nasopharyngeal swab, presence of viral mutation(s) within the areas targeted by this assay, and inadequate number of viral copies(<138 copies/mL). A negative result must be combined with clinical observations, patient history, and epidemiological information. The expected result is Negative.  Fact Sheet for Patients:  BloggerCourse.com  Fact Sheet for Healthcare Providers:  SeriousBroker.it  This test is no t yet approved or cleared by the [place] FDA and  has been authorized for detection and/or diagnosis of SARS-CoV-2 by FDA under an Emergency Use Authorization (EUA). This EUA will remain  in effect (meaning this test can be used) for the duration of the COVID-19 declaration under Section 564(b)(1) of the Act, 21 U.S.C.section 360bbb-3(b)(1), unless the authorization is terminated  or revoked sooner.       Influenza A by PCR NEGATIVE NEGATIVE Final   Influenza B by PCR NEGATIVE NEGATIVE Final    Comment: (NOTE) The Xpert Xpress SARS-CoV-2/FLU/RSV plus assay is intended as an aid in the diagnosis of influenza from Nasopharyngeal swab specimens and should not be used as a sole  basis for treatment. Nasal washings and aspirates are unacceptable for Xpert Xpress SARS-CoV-2/FLU/RSV testing.  Fact Sheet for Patients: BloggerCourse.com  Fact Sheet for Healthcare Providers: SeriousBroker.it  This test is not yet approved or cleared by the [place] FDA and has been authorized for detection and/or diagnosis of SARS-CoV-2 by FDA under an Emergency Use Authorization (EUA). This EUA will remain in effect (meaning this test can be used) for the duration of the COVID-19 declaration under Section 564(b)(1) of the Act, 21 U.S.C. section 360bbb-3(b)(1), unless the authorization is terminated or revoked.  Performed at [facility], [address] [place], [place] [address]   MRSA Next Gen by PCR, Nasal     Status: None   Collection Time: 04/23/21 11:37 AM   Specimen: Nasal Mucosa; Nasal Swab  Result Value Ref Range Status   MRSA by PCR Next Gen NOT DETECTED NOT DETECTED Final    Comment: (NOTE) The GeneXpert MRSA Assay (FDA approved for NASAL specimens only), is one component of a comprehensive MRSA colonization surveillance program. It is not intended to diagnose MRSA infection nor to guide or monitor treatment for MRSA infections. Test performance is not FDA approved in patients less than 75 years old. Performed at [facility], [address], [place], [place] [address]   Urine Culture     Status: None   Collection Time: 04/23/21  6:05 PM   Specimen: Urine, Clean Catch  Result Value Ref Range Status   Specimen Description   Final    URINE, CLEAN CATCH Performed at [facility], [address] [place], [place] [address]    Special Requests   Final    NONE Performed at [facility], [address], [place], [place] [address]  Culture   Final    NO GROWTH Performed at [facility], [address], [place], [place] [address]    Report Status 04/25/2021 FINAL  Final  Respiratory (~20 pathogens) panel  by PCR     Status: None   Collection Time: 04/25/21  8:05 PM   Specimen: Nasopharyngeal Swab; Respiratory  Result Value Ref Range Status   Adenovirus NOT DETECTED NOT DETECTED Final   Coronavirus 229E NOT DETECTED NOT DETECTED Final    Comment: (NOTE) The Coronavirus on the Respiratory Panel, DOES NOT test for the novel  Coronavirus (2019 nCoV)    Coronavirus HKU1 NOT DETECTED NOT DETECTED Final   Coronavirus NL63 NOT DETECTED NOT DETECTED Final   Coronavirus OC43 NOT DETECTED NOT DETECTED Final   Metapneumovirus NOT DETECTED NOT DETECTED Final   Rhinovirus / Enterovirus NOT DETECTED NOT DETECTED Final   Influenza A NOT DETECTED NOT DETECTED Final   Influenza B NOT DETECTED NOT DETECTED Final   Parainfluenza Virus 1 NOT DETECTED NOT DETECTED Final   Parainfluenza Virus 2 NOT DETECTED NOT DETECTED Final   Parainfluenza Virus 3 NOT DETECTED NOT DETECTED Final   Parainfluenza Virus 4 NOT DETECTED NOT DETECTED Final   Respiratory Syncytial Virus NOT DETECTED NOT DETECTED Final   Bordetella pertussis NOT DETECTED NOT DETECTED Final   Bordetella Parapertussis NOT DETECTED NOT DETECTED Final   Chlamydophila pneumoniae NOT DETECTED NOT DETECTED Final   Mycoplasma pneumoniae NOT DETECTED NOT DETECTED Final    Comment: Performed at [facility], [address] [place], [place] [address]     Scheduled Meds:  ARIPiprazole  30 mg Oral Daily   aspirin EC  81 mg Oral q morning   budesonide (PULMICORT) nebulizer solution  0.25 mg Nebulization BID   clonazePAM  0.5 mg Oral BID   clotrimazole  10 mg Oral 5 X Daily   docusate sodium  100 mg Oral TID   heparin  5,000 Units Subcutaneous Q8H   insulin aspart  0-15 Units Subcutaneous TID WC   insulin aspart  0-5 Units Subcutaneous QHS   insulin aspart  15 Units Subcutaneous TID WC   insulin glargine-yfgn  40 Units Subcutaneous QHS   insulin glargine-yfgn  40 Units Subcutaneous Daily   isosorbide mononitrate  30 mg Oral Daily   mirabegron  ER  25 mg Oral BID   nystatin   Topical BID   pravastatin  40 mg Oral q1800   propranolol  60 mg Oral Q1400   vitamin B-12  500 mcg Oral Daily   Continuous Infusions:  Procedures/Studies: CT ABDOMEN PELVIS WO CONTRAST  Result Date: 04/24/2021 CLINICAL DATA:  Abdominal pain and fever. Acute on chronic renal failure. Previous hysterectomy. EXAM: CT ABDOMEN AND PELVIS WITHOUT CONTRAST TECHNIQUE: Multidetector CT imaging of the abdomen and pelvis was performed following the standard protocol without IV contrast. COMPARISON:  04/11/2014 FINDINGS: Lower chest: No acute findings. Hepatobiliary: No mass visualized on this unenhanced exam. Gallbladder is unremarkable. No evidence of biliary ductal dilatation. Pancreas: No mass or inflammatory process visualized on this unenhanced exam. Spleen:  Within normal limits in size. Adrenals/Urinary tract: No evidence of urolithiasis or hydronephrosis. Increased symmetric bilateral perinephric stranding is noted, and consistent with medical renal disease. Image degradation by motion artifact noted. Several lesions are seen in both kidneys which have increased in size since previous study, but cannot be characterized on this unenhanced exam. Urinary bladder is empty with Foley catheter in place. Stomach/Bowel: No evidence of obstruction, inflammatory  process, or abnormal fluid collections. Vascular/Lymphatic: No pathologically enlarged lymph nodes identified. No evidence of abdominal aortic aneurysm. Aortic atherosclerotic calcification noted. Reproductive: Prior hysterectomy noted. Adnexal regions are unremarkable in appearance. Other: A small paraumbilical ventral hernia is again seen which contains only fat. New moderate diffuse body wall edema is also seen. Musculoskeletal:  No suspicious bone lesions identified. IMPRESSION: No evidence of urolithiasis or hydronephrosis. Increased symmetric bilateral perinephric stranding, which is nonspecific but likely secondary to  medical renal disease. Increased size of several bilateral renal lesions, which cannot be characterized on this unenhanced exam. Abdomen MRI is recommended for further characterization, preferably without and with contrast if renal function permits. New diffuse body wall edema. Stable small paraumbilical ventral hernia containing only fat. Electronically Signed   By: Danae Orleans M.D.   On: 04/24/2021 19:42   DG Chest 1 View  Result Date: 04/22/2021 CLINICAL DATA:  Fall today. PT c/o pain in pelvis and Rt shoulder/Rt upper chest since fall. Hx COPD, nonsmoker, diabetes, HTN, obesity EXAM: CHEST  1 VIEW COMPARISON:  04/15/2021 FINDINGS: Lungs are clear. Heart size upper limits normal. Aortic Atherosclerosis (ICD10-170.0). No effusion.  No pneumothorax. Right shoulder dislocation IMPRESSION: No acute cardiopulmonary disease. Right shoulder dislocation Electronically Signed   By: Corlis Leak M.D.   On: 04/22/2021 10:10   DG Pelvis 1-2 Views  Result Date: 04/22/2021 CLINICAL DATA:  Pain post fall EXAM: PELVIS - 1-2 VIEW COMPARISON:  CT 04/11/2014 FINDINGS: There is no evidence of pelvic fracture or diastasis. The right femoral neck is not well profiled. Chronic osteitis pubis. Degenerative changes in the lower lumbar spine. Surgical clips in the left lower quadrant. Bilateral pelvic phleboliths. No pelvic bone lesions are seen. IMPRESSION: No acute findings. Electronically Signed   By: Corlis Leak M.D.   On: 04/22/2021 10:12   DG Shoulder Right  Result Date: 04/25/2021 CLINICAL DATA:  Right shoulder reduction. EXAM: DG C-ARM 1-60 MIN; RIGHT SHOULDER - 2+ VIEW FLUOROSCOPY TIME:  Fluoroscopy Time:  11 seconds. Number of Acquired Spot Images: 3 COMPARISON:  04/24/2021. FINDINGS: Three C-arm fluoroscopic images were obtained intraoperatively and submitted for post operative interpretation. These images demonstrate reduction of the right shoulder a which appears to be located but potentially slightly superiorly  subluxed. Fracture fragment better characterized on prior radiographs. Please see the performing provider's procedural report for further detail. IMPRESSION: Intraoperative fluoroscopy, as detailed above. Electronically Signed   By: Feliberto Harts MD   On: 04/25/2021 15:47   DG Shoulder Right  Result Date: 04/24/2021 CLINICAL DATA:  Anterior shoulder dislocation patient reports dislocated right shoulder yesterday with continued pain. EXAM: RIGHT SHOULDER - 2+ VIEW COMPARISON:  Shoulder radiograph 04/22/2021. Procedural fluoroscopy 04/22/2021 during shoulder dislocation reduction. FINDINGS: Recurrent anterior dislocation of the humeral head with respect to the glenoid. Small displaced fracture fragments laterally, donor site may be the lateral humeral head, however fragments also project adjacent to the glenoid on the [place] view. IMPRESSION: Recurrent anterior shoulder dislocation. Small displaced fracture fragments laterally, donor site may be the lateral humeral head, however fragments also project adjacent to the glenoid on the [place] view. Electronically Signed   By: Narda Rutherford M.D.   On: 04/24/2021 17:41   DG Shoulder Right  Result Date: 04/22/2021 CLINICAL DATA:  Right shoulder dislocation. EXAM: DG C-ARM 1-60 MIN; RIGHT SHOULDER - 2+ VIEW FLUOROSCOPY TIME:  Fluoroscopy Time:  48 seconds. Number of Acquired Spot Images: 3 COMPARISON:  04/22/2021. FINDINGS: Three C-arm fluoroscopic images were obtained intraoperatively and  submitted for post operative interpretation. These images demonstrate closed reduction of the right shoulder. The shoulder appears to be located. Please see the performing provider's procedural report for further detail. IMPRESSION: Intraoperative fluoroscopy during shoulder reduction. The right shoulder appears to be located. Post reduction radiographs could confirm and better evaluate for fracture. Electronically Signed   By: Feliberto Harts MD   On: 04/22/2021 16:25    DG Shoulder Right  Result Date: 04/22/2021 CLINICAL DATA:  Pain post fall EXAM: RIGHT SHOULDER - 2+ VIEW COMPARISON:  None. FINDINGS: Anterior right shoulder dislocation. Small cortical fragments project at the posterior margin of the glenoid. Proximal humerus appears intact. IMPRESSION: Anterior shoulder dislocation Electronically Signed   By: Corlis Leak M.D.   On: 04/22/2021 10:13   CT Head Wo Contrast  Result Date: 04/22/2021 CLINICAL DATA:  Larey Seat, pain EXAM: CT HEAD WITHOUT CONTRAST TECHNIQUE: Contiguous axial images were obtained from the base of the skull through the vertex without intravenous contrast. COMPARISON:  02/07/2012 FINDINGS: Brain: Mild parenchymal atrophy. Patchy areas of hypoattenuation in deep and periventricular white matter bilaterally. Negative for acute intracranial hemorrhage, mass lesion, acute infarction, midline shift, or mass-effect. Acute infarct may be inapparent on noncontrast CT. Ventricles and sulci symmetric. Vascular: No hyperdense vessel or unexpected calcification. Skull: Normal. Negative for fracture or focal lesion. Sinuses/Orbits: No acute finding. Other: None IMPRESSION: 1. Negative for bleed or other acute intracranial process. 2. Atrophy and nonspecific white matter changes. Electronically Signed   By: Corlis Leak M.D.   On: 04/22/2021 10:09   CT CHEST WO CONTRAST  Result Date: 04/22/2021 CLINICAL DATA:  Dyspnea, chronic, unclear etiology Respiratory illness, nondiagnostic xray EXAM: CT CHEST WITHOUT CONTRAST TECHNIQUE: Multidetector CT imaging of the chest was performed following the standard protocol without IV contrast. COMPARISON:  CT chest abdomen pelvis 04/11/2014 FINDINGS: Cardiovascular: Normal heart size. Trace pericardial effusion. The thoracic aorta is normal in caliber. At least mild atherosclerotic plaque of the thoracic aorta. At least 2 vessel coronary artery calcifications. Question enlarged main pulmonary artery. Mediastinum/Nodes: No gross  hilar adenopathy, noting limited sensitivity for the detection of hilar adenopathy on this noncontrast study. No enlarged mediastinal or axillary lymph nodes. Thyroid gland, trachea, and esophagus demonstrate no significant findings. Lungs/Pleura: Bilateral dependent posterior subsegmental atelectasis. Bilateral lower lobe base linear atelectasis versus scarring. No focal consolidation. Few scattered calcified and noncalcified pulmonary micronodules. No pulmonary mass. No pleural effusion. No pneumothorax. Upper Abdomen: There is a 1.6 cm fluid density lesion at the superior pole of the left kidney likely represents a simple renal cyst. Otherwise no acute abnormality. Musculoskeletal: No chest wall abnormality. No suspicious lytic or blastic osseous lesions. No acute displaced fracture. Multilevel degenerative changes of the spine. Old posterior left third rib fracture. IMPRESSION: 1. No acute intrathoracic abnormality with limited evaluation on this noncontrast study. 2. Question enlarged main pulmonary artery suggestive of pulmonary hypertension. 3. Aortic Atherosclerosis (ICD10-I70.0) including coronary calcifications. Electronically Signed   By: Tish Frederickson M.D.   On: 04/22/2021 20:52   CT Angio Chest Pulmonary Embolism (PE) W or WO Contrast  Result Date: 04/27/2021 CLINICAL DATA:  Acute on chronic respiratory failure with hypoxia, elevated d-dimer. EXAM: CT ANGIOGRAPHY CHEST WITH CONTRAST TECHNIQUE: Multidetector CT imaging of the chest was performed using the standard protocol during bolus administration of intravenous contrast. Multiplanar CT image reconstructions and MIPs were obtained to evaluate the vascular anatomy. CONTRAST:  OMNIPAQUE IOHEXOL 350 MG/ML SOLN COMPARISON:  CT angiography chest 04/22/2021, CT abdomen pelvis 04/24/2021  FINDINGS: Cardiovascular: Fairly satisfactory opacification of the pulmonary arteries to the segmental level. No evidence of pulmonary embolism. Normal heart  size. No significant pericardial effusion. The thoracic aorta is normal in caliber. Mild atherosclerotic plaque of the thoracic aorta. Coronary artery calcifications. Mediastinum/Nodes: No enlarged mediastinal, hilar, or axillary lymph nodes. Thyroid gland, trachea, and esophagus demonstrate no significant findings. Lungs/Pleura: Bilateral lower lobe subsegmental atelectasis. No focal consolidation. No pulmonary nodule. No pulmonary mass. No pleural effusion. No pneumothorax. Upper Abdomen: Poorly visualized evaluated 2.1 cm density within left kidney with a no acute abnormality. Musculoskeletal: No abdominal wall hernia or abnormality. No suspicious lytic or blastic osseous lesions. No acute displaced fracture. Multilevel degenerative changes of the spine. Review of the MIP images confirms the above findings. IMPRESSION: 1. No central or proximal segmental pulmonary embolus. Limited evaluation more distally due to timing of contrast. 2. No acute intrathoracic abnormality. 3. Indeterminate 2.1 cm left renal lesion. Recommend MRI renal protocol for further evaluation. 4. Aortic Atherosclerosis (ICD10-I70.0) -including coronary artery calcifications. Electronically Signed   By: Tish Frederickson M.D.   On: 04/27/2021 23:47   US RENAL  Result Date: 04/22/2021 CLINICAL DATA:  Dyspnea.  Chronic stage III A renal disease. EXAM: RENAL / URINARY TRACT ULTRASOUND COMPLETE COMPARISON:  None. FINDINGS: Right Kidney: Renal measurements: 13.2 x 6.5 x 6.5 cm = volume: 292 mL. Echogenicity within normal limits. No mass or hydronephrosis visualized. Left Kidney: Limited evaluation. Renal measurements: 14.7 x 6.8 x 8.1 cm= volume: 423 mL. Echogenicity within normal limits. There is a 3.7 x 3.2 x 3.7 cm cystic lesion within the left kidney. No solid mass or hydronephrosis visualized. Urinary bladder: Appears normal for degree of bladder distention. Other: None. IMPRESSION: Grossly unremarkable renal ultrasound with a 3.7 cm left  simple renal cyst. Please note otherwise limited evaluation of the left kidney. Electronically Signed   By: Tish Frederickson M.D.   On: 04/22/2021 20:05   US Venous Img Lower Bilateral (DVT)  Result Date: 04/23/2021 CLINICAL DATA:  Chronic lower extremity pain and edema EXAM: BILATERAL LOWER EXTREMITY VENOUS DOPPLER ULTRASOUND TECHNIQUE: Gray-scale sonography with graded compression, as well as color Doppler and duplex ultrasound were performed to evaluate the lower extremity deep venous systems from the level of the common femoral vein and including the common femoral, femoral, profunda femoral, popliteal and calf veins including the posterior tibial, peroneal and gastrocnemius veins when visible. The superficial great saphenous vein was also interrogated. Spectral Doppler was utilized to evaluate flow at rest and with distal augmentation maneuvers in the common femoral, femoral and popliteal veins. COMPARISON:  None. FINDINGS: RIGHT LOWER EXTREMITY Common Femoral Vein: No evidence of thrombus. Normal compressibility, respiratory phasicity and response to augmentation. Saphenofemoral Junction: No evidence of thrombus. Normal compressibility and flow on color Doppler imaging. Profunda Femoral Vein: No evidence of thrombus. Normal compressibility and flow on color Doppler imaging. Femoral Vein: No evidence of thrombus. Normal compressibility, respiratory phasicity and response to augmentation. Popliteal Vein: No evidence of thrombus. Normal compressibility, respiratory phasicity and response to augmentation. Calf Veins: Limited visualization because of body habitus. Tibial peroneal veins appear grossly patent and compressible. LEFT LOWER EXTREMITY Common Femoral Vein: No evidence of thrombus. Normal compressibility, respiratory phasicity and response to augmentation. Saphenofemoral Junction: No evidence of thrombus. Normal compressibility and flow on color Doppler imaging. Profunda Femoral Vein: No evidence of  thrombus. Normal compressibility and flow on color Doppler imaging. Femoral Vein: No evidence of thrombus. Normal compressibility, respiratory phasicity and response to augmentation. Popliteal  Vein: No evidence of thrombus. Normal compressibility, respiratory phasicity and response to augmentation. Calf Veins: Again limited because of body habitus. Posterior tibial vein appears patent. Peroneal vein not visualized. IMPRESSION: No significant femoropopliteal DVT in either extremity. Limited assessment of the calf veins because of body habitus. Electronically Signed   By: Judie Petit.  Shick M.D.   On: 04/23/2021 08:02   DG CHEST PORT 1 VIEW  Result Date: 04/27/2021 CLINICAL DATA:  Short of breath, COPD EXAM: PORTABLE CHEST 1 VIEW COMPARISON:  04/23/2021 FINDINGS: Single frontal view of the chest demonstrates an enlarged cardiac silhouette. There is central vascular congestion without airspace disease, effusion, or pneumothorax. No acute bony abnormalities. IMPRESSION: 1. Central vascular congestion without overt edema. Electronically Signed   By: Sharlet Salina M.D.   On: 04/27/2021 16:20   DG CHEST PORT 1 VIEW  Result Date: 04/23/2021 CLINICAL DATA:  Shortness of breath.  Acute respiratory failure. EXAM: PORTABLE CHEST 1 VIEW COMPARISON:  April 22, 2021 FINDINGS: Stable cardiomegaly. The hila and mediastinum are not significantly changed given the low volume rotated portable technique. Increased interstitial markings bilaterally. Mild opacity in the bases, likely atelectasis. No other acute abnormalities. IMPRESSION: Findings are favored to represent cardiomegaly with pulmonary venous congestion/mild edema and bibasilar opacities, likely atelectasis. Electronically Signed   By: Gerome Sam III M.D   On: 04/23/2021 11:29   DG Chest Port 1 View  Result Date: 04/15/2021 CLINICAL DATA:  sob EXAM: PORTABLE CHEST 1 VIEW COMPARISON:  April 16, 2014 FINDINGS: The cardiomediastinal silhouette is unchanged and enlarged in  contour.Atherosclerotic calcifications. No pleural effusion. No pneumothorax. Perihilar vascular prominence without overt edema. Visualized abdomen is unremarkable. No acute osseous abnormality. IMPRESSION: No acute cardiopulmonary abnormality. Electronically Signed   By: Meda Klinefelter MD   On: 04/15/2021 15:55   DG C-Arm 1-60 Min  Result Date: 04/25/2021 CLINICAL DATA:  Right shoulder reduction. EXAM: DG C-ARM 1-60 MIN; RIGHT SHOULDER - 2+ VIEW FLUOROSCOPY TIME:  Fluoroscopy Time:  11 seconds. Number of Acquired Spot Images: 3 COMPARISON:  04/24/2021. FINDINGS: Three C-arm fluoroscopic images were obtained intraoperatively and submitted for post operative interpretation. These images demonstrate reduction of the right shoulder a which appears to be located but potentially slightly superiorly subluxed. Fracture fragment better characterized on prior radiographs. Please see the performing provider's procedural report for further detail. IMPRESSION: Intraoperative fluoroscopy, as detailed above. Electronically Signed   By: Feliberto Harts MD   On: 04/25/2021 15:47   DG C-Arm 1-60 Min  Result Date: 04/22/2021 CLINICAL DATA:  Right shoulder dislocation. EXAM: DG C-ARM 1-60 MIN; RIGHT SHOULDER - 2+ VIEW FLUOROSCOPY TIME:  Fluoroscopy Time:  48 seconds. Number of Acquired Spot Images: 3 COMPARISON:  04/22/2021. FINDINGS: Three C-arm fluoroscopic images were obtained intraoperatively and submitted for post operative interpretation. These images demonstrate closed reduction of the right shoulder. The shoulder appears to be located. Please see the performing provider's procedural report for further detail. IMPRESSION: Intraoperative fluoroscopy during shoulder reduction. The right shoulder appears to be located. Post reduction radiographs could confirm and better evaluate for fracture. Electronically Signed   By: Feliberto Harts MD   On: 04/22/2021 16:25   ECHOCARDIOGRAM COMPLETE  Result Date: 04/24/2021     ECHOCARDIOGRAM REPORT   Patient Name:   ANGELL PINCOCK Date of Exam: 04/23/2021 Medical Rec #:  [id number]       Height:       68.0 in Accession #:    [id number]      Weight:  346.8 lb Date of Birth:  05-15-1966       BSA:          2.582 m Patient Age:    55 years        BP:           160/67 mmHg Patient Gender: F               HR:           94 bpm. Exam Location:  Inpatient Procedure: 2D Echo, Cardiac Doppler and Color Doppler Indications:    Dyspnea R06.00  History:        Patient has prior history of Echocardiogram examinations, most                 recent 04/19/2014. COPD; Risk Factors:Hypertension and Diabetes.                 Parkinson's Disease. Seizures.  Sonographer:    Tiffany Dance Referring Phys: [phone] DAVID TAT  Sonographer Comments: Echo performed with patient supine and on artificial respirator and Technically difficult study due to poor echo windows. IMPRESSIONS  1. Left ventricular ejection fraction, by estimation, is 70 to 75%. The left ventricle has hyperdynamic function. The left ventricle has no regional wall motion abnormalities. There is mild left ventricular hypertrophy. Left ventricular diastolic parameters are indeterminate.  2. Right ventricular systolic function is mildly reduced. The right ventricular size is mildly enlarged. Tricuspid regurgitation signal is inadequate for assessing PA pressure.  3. Left atrial size was mildly dilated.  4. Right atrial size was mildly dilated.  5. A small pericardial effusion is present.  6. The mitral valve is degenerative. Trivial mitral valve regurgitation. Mild mitral stenosis. The mean mitral valve gradient is 4.6 mmHg with average heart rate of 95 bpm. Severe mitral annular calcification.  7. The aortic valve is grossly normal. There is mild calcification of the aortic valve. Aortic valve regurgitation is not visualized. No aortic stenosis is present.  8. Increased flow velocities may be secondary to anemia, thyrotoxicosis, hyperdynamic or  high flow state. 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 Left ventricular ejection fraction, by estimation, is 70 to 75%. The left ventricle has hyperdynamic function. The left ventricle has no regional wall motion abnormalities.  The left ventricular internal cavity size was normal in size. There is mild left ventricular hypertrophy. Left ventricular diastolic parameters are indeterminate. Right Ventricle: The right ventricular size is mildly enlarged. Right vetricular wall thickness was not well visualized. Right ventricular systolic function is mildly reduced. Tricuspid regurgitation signal is inadequate for assessing PA pressure. Left Atrium: Left atrial size was mildly dilated. Right Atrium: Right atrial size was mildly dilated. Pericardium: A small pericardial effusion is present. Mitral Valve: The mitral valve is degenerative in appearance. Severe mitral annular calcification. Trivial mitral valve regurgitation. Mild mitral valve stenosis. The mean mitral valve gradient is 4.6 mmHg with average heart rate of 95 bpm. Tricuspid Valve: The tricuspid valve is grossly normal. Tricuspid valve regurgitation is trivial. No evidence of tricuspid stenosis. Aortic Valve: The aortic valve is grossly normal. There is mild calcification of the aortic valve. Aortic valve regurgitation is not visualized. No aortic stenosis is present. Pulmonic Valve: The pulmonic valve was grossly normal. Pulmonic valve regurgitation is trivial. No evidence of pulmonic stenosis. Aorta: The aortic root is normal in size and structure. Venous: The inferior vena cava was not well visualized. IVC assessment for right atrial pressure  unable to be performed due to mechanical ventilation. IAS/Shunts: No atrial level shunt detected by color flow Doppler.  LEFT VENTRICLE PLAX 2D LVIDd:         4.04 cm  Diastology LVIDs:         2.71 cm  LV e' medial:    6.22 cm/s LV PW:         1.30 cm  LV E/e'  medial:  19.9 LV IVS:        1.11 cm  LV e' lateral:   9.20 cm/s LVOT diam:     1.90 cm  LV E/e' lateral: 13.5 LV SV:         106 LV SV Index:   41 LVOT Area:     2.84 cm  RIGHT VENTRICLE RV Basal diam:  3.98 cm RV Mid diam:    2.75 cm RV S prime:     16.20 cm/s TAPSE (M-mode): 2.0 cm LEFT ATRIUM             Index       RIGHT ATRIUM           Index LA diam:        3.30 cm 1.28 cm/m  RA Area:     18.60 cm LA Vol (A2C):   65.4 ml 25.33 ml/m RA Volume:   55.10 ml  21.34 ml/m LA Vol (A4C):   41.0 ml 15.88 ml/m LA Biplane Vol: 50.1 ml 19.40 ml/m  AORTIC VALVE LVOT Vmax:   181.00 cm/s LVOT Vmean:  124.000 cm/s LVOT VTI:    0.375 m  AORTA Ao Root diam: 3.10 cm Ao Asc diam:  3.50 cm MITRAL VALVE MV Area (PHT): 2.29 cm     SHUNTS MV Area VTI:   3.14 cm     Systemic VTI:  0.38 m MV Mean grad:  4.6 mmHg     Systemic Diam: 1.90 cm MV VTI:        0.34 m MV Decel Time: 331 msec MV E velocity: 124.00 cm/s MV A velocity: 144.00 cm/s MV E/A ratio:  0.86 Weston BrassGayatri Acharya MD Electronically signed by Weston BrassGayatri Acharya MD Signature Date/Time: 04/24/2021/9:10:32 AM    Final     Erick BlinksJehanzeb Aeric Burnham, MD  [facility]  If 7PM-7AM, please contact night-coverage [email or web address]  05/01/2021, 8:03 PM   LOS: 9 days

## 2021-05-02 LAB — GLUCOSE, CAPILLARY
Glucose-Capillary: 159 mg/dL — ABNORMAL HIGH (ref 70–99)
Glucose-Capillary: 237 mg/dL — ABNORMAL HIGH (ref 70–99)
Glucose-Capillary: 253 mg/dL — ABNORMAL HIGH (ref 70–99)
Glucose-Capillary: 192 mg/dL — ABNORMAL HIGH (ref 70–99)

## 2021-05-02 LAB — CBC
HCT: 49.1 % — ABNORMAL HIGH (ref 36.0–46.0)
Hemoglobin: 14.3 g/dL (ref 12.0–15.0)
MCH: 27.4 pg (ref 26.0–34.0)
MCHC: 29.1 g/dL — ABNORMAL LOW (ref 30.0–36.0)
MCV: 94.1 fL (ref 80.0–100.0)
Platelets: 195 10*3/uL (ref 150–400)
RBC: 5.22 MIL/uL — ABNORMAL HIGH (ref 3.87–5.11)
RDW: 15 % (ref 11.5–15.5)
WBC: 4.6 10*3/uL (ref 4.0–10.5)
nRBC: 0 % (ref 0.0–0.2)

## 2021-05-02 LAB — BASIC METABOLIC PANEL
Anion gap: 11 (ref 5–15)
BUN: 26 mg/dL — ABNORMAL HIGH (ref 6–20)
CO2: 34 mmol/L — ABNORMAL HIGH (ref 22–32)
Calcium: 9.4 mg/dL (ref 8.9–10.3)
Chloride: 95 mmol/L — ABNORMAL LOW (ref 98–111)
Creatinine, Ser: 1.29 mg/dL — ABNORMAL HIGH (ref 0.44–1.00)
GFR, Estimated: 49 mL/min — ABNORMAL LOW (ref >60)
Glucose, Bld: 151 mg/dL — ABNORMAL HIGH (ref 70–99)
Potassium: 3.8 mmol/L (ref 3.5–5.1)
Sodium: 140 mmol/L (ref 135–145)

## 2021-05-02 MED ORDER — INSULIN ASPART 100 UNIT/ML IJ SOLN
18.0000 [IU] | Freq: Three times a day (TID) | INTRAMUSCULAR | Status: DC
  Administered 2021-05-02: 18 [IU] via SUBCUTANEOUS

## 2021-05-02 MED ORDER — INSULIN GLARGINE-YFGN 100 UNIT/ML ~~LOC~~ SOLN
42.0000 [IU] | Freq: Every day | SUBCUTANEOUS | Status: DC
  Administered 2021-05-03 – 2021-05-04 (×2): 42 [IU] via SUBCUTANEOUS
  Filled 2021-05-02 (×4): qty 0.42

## 2021-05-02 MED ORDER — INSULIN GLARGINE-YFGN 100 UNIT/ML ~~LOC~~ SOLN
42.0000 [IU] | Freq: Every day | SUBCUTANEOUS | Status: DC
  Administered 2021-05-02 – 2021-05-03 (×2): 42 [IU] via SUBCUTANEOUS
  Filled 2021-05-02 (×3): qty 0.42

## 2021-05-02 NOTE — Progress Notes (Signed)
   05/02/21 1503  Assess: MEWS Score  BP 108/68  Pulse Rate (!) 113  Assess: MEWS Score  MEWS Temp 0  MEWS Systolic 0  MEWS Pulse 2  MEWS RR 0  MEWS LOC 0  MEWS Score 2  MEWS Score Color Yellow  Assess: if the MEWS score is Yellow or Red  Were vital signs taken at a resting state? Yes  Focused Assessment No change from prior assessment  Early Detection of Sepsis Score *See Row Information* Low  MEWS guidelines implemented *See Row Information* Yes  Take Vital Signs  Increase Vital Sign Frequency  Yellow: Q 2hr X 2 then Q 4hr X 2, if remains yellow, continue Q 4hrs  Escalate  MEWS: Escalate Yellow: discuss with charge nurse/RN and consider discussing with provider and RRT  Notify: Charge Nurse/RN  Name of Charge Nurse/RN Notified Chales Abrahams RN  Date Charge Nurse/RN Notified 05/02/21  Time Charge Nurse/RN Notified 1529  Notify: Provider  Provider Name/Title Blake Divine  Date Provider Notified 05/02/21  Time Provider Notified 1529  Notification Type Page  Notification Reason Other (Comment) (heart rate and yellow mews score)  Provider response No new orders  Date of Provider Response 05/02/21  Time of Provider Response 1530

## 2021-05-02 NOTE — Progress Notes (Signed)
Inpatient Diabetes Program Recommendations  AACE/ADA: New Consensus Statement on Inpatient Glycemic Control   Target Ranges:  Prepandial:   less than 140 mg/dL      Peak postprandial:   less than 180 mg/dL (1-2 hours)      Critically ill patients:  140 - 180 mg/dL      Review of Glycemic Control Results for [name], OSBORNE (MRN [id number]) as of 05/02/2021 14:23  Ref. Range 05/01/2021 07:51 05/01/2021 11:03 05/01/2021 15:53 05/01/2021 20:20 05/02/2021 07:30 05/02/2021 11:25  Glucose-Capillary Latest Ref Range: 70 - 99 mg/dL 338 (H) 250 (H) 539 (H) 182 (H) 159 (H) 237 (H)   Diabetes history: DM2 Outpatient Diabetes medications: Lantus 55 units BID,  Victoza 1.8 mg daily in AM Current orders for Inpatient glycemic control: Semglee 25 units daily, Semglee 40 units QHS, Novolog 0-15 units TID with meals, Novolog 0-5 units QHS, Novolog 10 units TID with meals   Inpatient Diabetes Program Recommendations:     -  Increase Semglee to 42 units QAM and Semglee 42 units QHS -  Increase meal coverage to Novolog 20 units TID with meals if patient eats at least 50% of meals.   Thanks,  Christena Deem RN, MSN, BC-ADM Inpatient Diabetes Coordinator Team Pager [phone] (8a-5p)

## 2021-05-02 NOTE — Progress Notes (Signed)
PROGRESS NOTE    [name]  NLG:[id number] DOB: 11-07-1965 DOA: 04/22/2021 PCP: [facility]    Chief Complaint  Patient presents with   Fall    Brief Narrative: 55 y.o. female with medical history of current impairment, bipolar disorder, seizure disorder, hypertension, COPD, chronic respiratory failure on 3 L, diabetes mellitus type 2, CKD stage III presenting with mechanical fall on the a.m. of 04/22/2021. She was found to have anterior right shoulder dislocation.   Assessment & Plan:   Active Problems:   Acute on chronic respiratory failure with hypoxia (HCC)   Acute renal failure superimposed on stage 3a chronic kidney disease (HCC)   Anterior shoulder dislocation, right, initial encounter   Acute on chronic respiratory failure with hypoxia and hypercapnia (HCC)   Traumatic closed displaced fracture of right shoulder with anterior dislocation   Closed fracture dislocation of joint of right shoulder girdle   AKI (acute kidney injury) (HCC)   Acute on chronic respiratory failure with hypoxia and hypercarbia.  Secondary to OSA/OHS, on 3 lit of [place] oxygen, currently on 4 lit of [place] oxygen.  CT angio neg for PE,  Echocardiogram showed LVEF of 70% ,no wall motion abnormalities.  Recommend outpatient follow up with pulmonology for PFT's and sleep study.    Anterior right shoulder dislocation:  Orthopedics, Dr. Romeo Apple consult appreciated -7/29- and again on 04/25/21 -closed reduction of shoulder dislocation Keep sling in place for atleast 2 weeks.  Follow up with orthopedics in 2 weeks post discharge.    Fever:  So far work up has been negative.    Uncontrolled DM type 2:  Insulin dependent. Uncontrolled with hyperglycemia:  CBG (last 3)  Recent Labs    05/02/21 0730 05/02/21 1125 05/02/21 1614  GLUCAP 159* 237* 253*   Increase the novolog and lantus.     Body mass index is 51.89 kg/m. Morbid obesity    Hypertension; Well  controlled on Imdur and Inderal.    Vit b12 deficiency:  Supplementation given.    Hyperlipidemia:  Resume statin.     Bipolar disorder:  Resume home meds.     DVT prophylaxis: (sq Heparin) Code Status: Full code.  Family Communication: none at bedside.  Disposition:   Status is: Inpatient  Remains inpatient appropriate because:Unsafe d/c plan  Dispo: The patient is from: Home              Anticipated d/c is to: SNF              Patient currently is not medically stable to d/c.   Difficult to place patient No       Consultants:  None.   Procedures: (none.   Antimicrobials:  Antibiotics Given (last 72 hours)     None         Subjective: No new complaints.   Objective: Vitals:   05/02/21 0720 05/02/21 0849 05/02/21 1346 05/02/21 1503  BP:  (!) 135/92 93/62 108/68  Pulse:  (!) 109 100 (!) 113  Resp:   18   Temp:   98.1 F (36.7 C)   TempSrc:   Oral   SpO2: 94% 97% 94%   Weight:      Height:        Intake/Output Summary (Last 24 hours) at 05/02/2021 1703 Last data filed at 05/02/2021 1300 Gross per 24 hour  Intake 600 ml  Output 901 ml  Net -301 ml   Filed Weights   04/23/21 1116 04/25/21 0400  04/26/21 0500  Weight: (!) 157.3 kg (!) 153.9 kg (!) 154.8 kg    Examination:  General exam: Appears calm and comfortable  Respiratory system: Clear to auscultation. Respiratory effort normal. Cardiovascular system: S1 & S2 heard, RRR. No pedal edema. Gastrointestinal system: Abdomen is nondistended, soft and nontender.  Normal bowel sounds heard. Central nervous system: Alert and oriented. No focal neurological deficits. Extremities: Symmetric 5 x 5 power. Skin: No rashes, lesions or ulcers Psychiatry: Mood & affect appropriate.     Data Reviewed: I have personally reviewed following labs and imaging studies  CBC: Recent Labs  Lab 04/26/21 0355 04/27/21 0453 05/02/21 0607  WBC 5.0 5.4 4.6  HGB 13.7 13.9 14.3  HCT 46.4* 45.6 49.1*   MCV 92.8 90.1 94.1  PLT 178 173 195    Basic Metabolic Panel: Recent Labs  Lab 04/26/21 0355 04/26/21 2154 04/27/21 0453 04/28/21 0329 04/29/21 0413 05/02/21 0607  NA 139  --  138 136  --  140  K 4.9  --  4.2 4.0  --  3.8  CL 99  --  93* 92*  --  95*  CO2 31  --  33* 33*  --  34*  GLUCOSE 289* 382* 278* 275*  --  151*  BUN 26*  --  25* 26*  --  26*  CREATININE 1.13*  --  1.27* 1.39* 1.18* 1.29*  CALCIUM 8.6*  --  8.7* 8.8*  --  9.4  PHOS  --   --   --  2.6  --   --     GFR: Estimated Creatinine Clearance: 77.1 mL/min (A) (by C-G formula based on SCr of 1.29 mg/dL (H)).  Liver Function Tests: Recent Labs  Lab 04/26/21 0355 04/28/21 0329  AST 20  --   ALT 24  --   ALKPHOS 74  --   BILITOT 1.1  --   PROT 5.6*  --   ALBUMIN 2.8* 2.9*    CBG: Recent Labs  Lab 05/01/21 1553 05/01/21 2020 05/02/21 0730 05/02/21 1125 05/02/21 1614  GLUCAP 181* 182* 159* 237* 253*     Recent Results (from the past 240 hour(s))  MRSA Next Gen by PCR, Nasal     Status: None   Collection Time: 04/23/21 11:37 AM   Specimen: Nasal Mucosa; Nasal Swab  Result Value Ref Range Status   MRSA by PCR Next Gen NOT DETECTED NOT DETECTED Final    Comment: (NOTE) The GeneXpert MRSA Assay (FDA approved for NASAL specimens only), is one component of a comprehensive MRSA colonization surveillance program. It is not intended to diagnose MRSA infection nor to guide or monitor treatment for MRSA infections. Test performance is not FDA approved in patients less than 19 years old. Performed at [facility], [address] [place], [place] [address]   Urine Culture     Status: None   Collection Time: 04/23/21  6:05 PM   Specimen: Urine, Clean Catch  Result Value Ref Range Status   Specimen Description   Final    URINE, CLEAN CATCH Performed at [facility], [address], [place], [place] [address]    Special Requests   Final    NONE Performed at [facility], [address]  [place], [place] [address]    Culture   Final    NO GROWTH Performed at [facility], [address], [place], [place] [address]    Report Status 04/25/2021 FINAL  Final  Respiratory (~20 pathogens) panel by PCR  Status: None   Collection Time: 04/25/21  8:05 PM   Specimen: Nasopharyngeal Swab; Respiratory  Result Value Ref Range Status   Adenovirus NOT DETECTED NOT DETECTED Final   Coronavirus 229E NOT DETECTED NOT DETECTED Final    Comment: (NOTE) The Coronavirus on the Respiratory Panel, DOES NOT test for the novel  Coronavirus (2019 nCoV)    Coronavirus HKU1 NOT DETECTED NOT DETECTED Final   Coronavirus NL63 NOT DETECTED NOT DETECTED Final   Coronavirus OC43 NOT DETECTED NOT DETECTED Final   Metapneumovirus NOT DETECTED NOT DETECTED Final   Rhinovirus / Enterovirus NOT DETECTED NOT DETECTED Final   Influenza A NOT DETECTED NOT DETECTED Final   Influenza B NOT DETECTED NOT DETECTED Final   Parainfluenza Virus 1 NOT DETECTED NOT DETECTED Final   Parainfluenza Virus 2 NOT DETECTED NOT DETECTED Final   Parainfluenza Virus 3 NOT DETECTED NOT DETECTED Final   Parainfluenza Virus 4 NOT DETECTED NOT DETECTED Final   Respiratory Syncytial Virus NOT DETECTED NOT DETECTED Final   Bordetella pertussis NOT DETECTED NOT DETECTED Final   Bordetella Parapertussis NOT DETECTED NOT DETECTED Final   Chlamydophila pneumoniae NOT DETECTED NOT DETECTED Final   Mycoplasma pneumoniae NOT DETECTED NOT DETECTED Final    Comment: Performed at [facility], [address], [place], [place] [address]         Radiology Studies: No results found.      Scheduled Meds:  ARIPiprazole  30 mg Oral Daily   aspirin EC  81 mg Oral q morning   budesonide (PULMICORT) nebulizer solution  0.25 mg Nebulization BID   clonazePAM  0.5 mg Oral BID   clotrimazole  10 mg Oral 5 X Daily   docusate sodium  100 mg Oral TID   heparin  5,000 Units Subcutaneous Q8H   insulin aspart  0-15 Units  Subcutaneous TID WC   insulin aspart  0-5 Units Subcutaneous QHS   insulin aspart  15 Units Subcutaneous TID WC   insulin glargine-yfgn  40 Units Subcutaneous QHS   insulin glargine-yfgn  40 Units Subcutaneous Daily   isosorbide mononitrate  30 mg Oral Daily   mirabegron ER  25 mg Oral BID   nystatin   Topical BID   pravastatin  40 mg Oral q1800   propranolol  60 mg Oral Q1400   vitamin B-12  500 mcg Oral Daily   Continuous Infusions:   LOS: 10 days        Kathlen Mody, MD [facility]   To contact the attending provider between 7A-7P or the covering provider during after hours 7P-7A, please log into the web site [email or web address] and access using universal [place] password for that web site. If you do not have the password, please call the hospital operator.  05/02/2021, 5:03 PM

## 2021-05-02 NOTE — Progress Notes (Signed)
Pt on BIPAP for hs pt tolerating well RT will continue to monitor

## 2021-05-03 LAB — GLUCOSE, CAPILLARY
Glucose-Capillary: 127 mg/dL — ABNORMAL HIGH (ref 70–99)
Glucose-Capillary: 214 mg/dL — ABNORMAL HIGH (ref 70–99)
Glucose-Capillary: 100 mg/dL — ABNORMAL HIGH (ref 70–99)
Glucose-Capillary: 108 mg/dL — ABNORMAL HIGH (ref 70–99)

## 2021-05-03 MED ORDER — INSULIN ASPART 100 UNIT/ML IJ SOLN
20.0000 [IU] | Freq: Three times a day (TID) | INTRAMUSCULAR | Status: DC
  Administered 2021-05-03 – 2021-05-04 (×5): 20 [IU] via SUBCUTANEOUS

## 2021-05-03 NOTE — TOC Progression Note (Signed)
Transition of Care [facility]) - Progression Note    Patient Details  Name: [name] MRN: [id number] Date of Birth: [birth year]  Transition of [facility]) CM/SW Contact  Leitha Bleak, RN Phone Number: 05/03/2021, 3:51 PM  Clinical Narrative:   [facility] accepted, she will go to room 40A. RN to call report to [phone]. TOC to schedule EMS for Wednesday AM. MD/RN updated.  Vaccine card faxed to [facility] in admissions.    Expected Discharge Plan: Home/Self Care Barriers to Discharge: No SNF bed  Expected Discharge Plan and Services Expected Discharge Plan: Home/Self Care    Living arrangements for the past 2 months: Single Family Home Expected Discharge Date: 05/03/21                  HH Arranged: PT, RN, Social Work [employer] Agency: [facility] ([facility]) Date HH Agency Contacted: 04/26/21 Time HH Agency Contacted: 1453 Representative spoke with at [facility] Agency: Alroy Bailiff  Readmission Risk Interventions Readmission Risk Prevention Plan 04/25/2021  Transportation Screening Complete  Home Care Screening Complete  Medication Review (RN CM) Complete  Some recent data might be hidden

## 2021-05-03 NOTE — Progress Notes (Signed)
Physical Therapy Treatment Patient Details Name: [name] MRN: [id number] DOB: 18-Nov-1965 Today's Date: 05/03/2021    History of Present Illness [name] is a 55 y.o. female presenting with mechanical fall on the a.m. of 04/22/2021. Pt Rt shoulder was dislocated and recieved a closed reduction on 04/22/2021; 04/25/21 xray showed recurrent anterior dislocation, returned to OR for another closed reduction under conscious sedation. Notably, the patient was noted to have a fever by EMS of 101.0 F.  She was given acetaminophen by EMS.  Prior to my assessment, the patient was noted to have an increasing oxygen requirement.  She was placed on 6 L nasal cannula.  Her spouse relates that the patient has been having more dyspnea on exertion over the past 3 to 4 weeks. PMH: bipolar disorder, seizure disorder, HTN, COPD, chronic respiratory failure on 3 L, diabetes mellitus type 2, CKD stage III    PT Comments    Pt motivated to get OOB, requests therapy uses hoyer but agreeable with encouragement and education to perform without hoyer. Pt requires mod A to upright trunk into sitting due to RUE NWB. Once seated EOB, pt reports "I'm having a panic attack" but able to soothe and encourage with therapy. Pt requires min A +2 to power to stand and mod A +2 to pivot to drop arm recliner at bedside, cues for sequencing and motivation. Pt fatigues with mobility, on 3L O2 with SpO2 93-94%. Will continue to progress acute PT as able.   Follow Up Recommendations  SNF     Equipment Recommendations  Other (comment) (to be determined)    Recommendations for Other Services       Precautions / Restrictions Precautions Precautions: Fall;Shoulder Shoulder Interventions: Shoulder sling/immobilizer Required Braces or Orthoses: Sling Restrictions Weight Bearing Restrictions: Yes RUE Weight Bearing: Non weight bearing    Mobility  Bed Mobility Overal bed mobility: Needs Assistance Bed Mobility: Supine to  Sit  Supine to sit: Mod assist;HOB elevated  General bed mobility comments: significantly increased time and assist due to RUE NWB, pt pulling on therapist's hand with LUE to upright trunk unto sitting    Transfers Overall transfer level: Needs assistance Equipment used: 2 person hand held assist Transfers: Sit to/from [employer] Sit to Stand: Min assist;+2 physical assistance;From elevated surface Stand pivot transfers: Mod assist;+2 physical assistance  General transfer comment: min A +2 to power to stand, constant VCs for sequencing and encouragement to pivot over to recliner with mod A +2, increased time  Ambulation/Gait                 Stairs             Wheelchair Mobility    Modified Rankin (Stroke Patients Only)       Balance Overall balance assessment: Needs assistance Sitting-balance support: Feet supported Sitting balance-Leahy Scale: Fair Sitting balance - Comments: seated EOB   Standing balance support: During functional activity Standing balance-Leahy Scale: Poor Standing balance comment: reliant on UE support     Cognition Arousal/Alertness: Awake/alert Behavior During Therapy: Anxious;WFL for tasks assessed/performed Overall Cognitive Status: Within Functional Limits for tasks assessed  General Comments: Pt anxious, verbalizes "I'm having a panic attack" multiple times during session that improves with encouragement and soothing techniques and then pt able to continue      Exercises      General Comments General comments (skin integrity, edema, etc.): Pt on 3L O2 with SpO2 93-94%      Pertinent  Vitals/Pain Pain Assessment: 0-10 Pain Score: 5  Pain Location: R shoulder Pain Descriptors / Indicators: Aching;Sore;Grimacing Pain Intervention(s): Limited activity within patient's tolerance;Monitored during session;Premedicated before session;Utilized relaxation techniques    Home Living                       Prior Function            PT Goals (current goals can now be found in the care plan section) Progress towards PT goals: Progressing toward goals    Frequency    Min 3X/week      PT Plan Current plan remains appropriate    Co-evaluation              AM-PAC PT "6 Clicks" Mobility   Outcome Measure  Help needed turning from your back to your side while in a flat bed without using bedrails?: A Lot Help needed moving from lying on your back to sitting on the side of a flat bed without using bedrails?: A Lot Help needed moving to and from a bed to a chair (including a wheelchair)?: A Lot Help needed standing up from a chair using your arms (e.g., wheelchair or bedside chair)?: A Lot Help needed to walk in hospital room?: A Lot Help needed climbing 3-5 steps with a railing? : Total 6 Click Score: 11    End of Session Equipment Utilized During Treatment: Oxygen Activity Tolerance: Patient tolerated treatment well;Patient limited by fatigue Patient left: in chair;with call bell/phone within reach Nurse Communication: Mobility status PT Visit Diagnosis: Muscle weakness (generalized) (M62.81);History of falling (Z91.81);Pain;Unsteadiness on feet (R26.81) Pain - Right/Left: Right Pain - part of body: Shoulder     Time: 5284-1324 PT Time Calculation (min) (ACUTE ONLY): 26 min  Charges:  $Therapeutic Activity: 23-37 mins                      Tori Catrina Fellenz PT, DPT 05/03/21, 1:09 PM

## 2021-05-03 NOTE — Discharge Summary (Addendum)
Physician Discharge Summary  [name] PPI:[id number] DOB: [birth year] DOA: 04/22/2021  PCP: [facility]  Admit date: 04/22/2021  Discharge date: 05/04/2021  Admitted From:Home  Disposition:  SNF  Recommendations for Outpatient Follow-up:  Follow up with PCP in 1-2 weeks Follow up with Orthopedics Dr. Romeo Apple in 2 weeks to reevaluate shoulder dislocation Follow-up with pulmonology with referral sent for PFTs and sleep study Continue medications as prescribed  Home Health: None  Equipment/Devices: None  Discharge Condition:Stable  CODE STATUS: Full  Diet recommendation: Heart Healthy/carb modified  Brief/Interim Summary:  55 y.o. female with medical history of current impairment, bipolar disorder, seizure disorder, hypertension, COPD, chronic respiratory failure on 3 L, diabetes mellitus type 2, CKD stage III presenting with mechanical fall on the a.m. of 04/22/2021. She was found to have anterior right shoulder dislocation.  This was reduced by orthopedics on 7/29 and then again on 8/1.  She is supposed to keep her sling in place over the course of 2 weeks and then follow-up with orthopedics outpatient for which referral has been sent.  Additionally, she was noted to develop some acute on chronic hypoxemic and hypercarbic respiratory failure secondary to obstructive sleep apnea and requires 3 L nasal cannula oxygen.  Plan will be to continue oxygen and follow-up with pulmonology for outpatient PFTs and sleep study.  Her echocardiogram was unremarkable with LVEF of 70% and CT angiogram was negative for PE.  Discharge Diagnoses:  Active Problems:   Acute on chronic respiratory failure with hypoxia (HCC)   Acute renal failure superimposed on stage 3a chronic kidney disease (HCC)   Anterior shoulder dislocation, right, initial encounter   Acute on chronic respiratory failure with hypoxia and hypercapnia (HCC)   Traumatic closed displaced fracture of right  shoulder with anterior dislocation   Closed fracture dislocation of joint of right shoulder girdle   AKI (acute kidney injury) (HCC)  Principal discharge diagnosis: Acute on chronic combined respiratory failure related to OSA/OHS along with anterior right shoulder dislocation.  Discharge Instructions  Discharge Instructions     Ambulatory referral to Orthopedic Surgery   Complete by: As directed    Ambulatory referral to Pulmonology   Complete by: As directed    Reason for referral: Asthma/COPD   Diet - low sodium heart healthy   Complete by: As directed    Increase activity slowly   Complete by: As directed    No wound care   Complete by: As directed       Allergies as of 05/04/2021       Reactions   Bee Venom Hives        Medication List     STOP taking these medications    calcium carbonate 600 MG Tabs tablet Commonly known as: OS-CAL   clonazePAM 1 MG tablet Commonly known as: KLONOPIN   gabapentin 300 MG capsule Commonly known as: NEURONTIN   lisinopril 2.5 MG tablet Commonly known as: ZESTRIL   nystatin 100000 UNIT/ML suspension Commonly known as: MYCOSTATIN   predniSONE 20 MG tablet Commonly known as: DELTASONE       TAKE these medications    ARIPiprazole 30 MG tablet Commonly known as: ABILIFY Take 30 mg by mouth every morning.   aspirin EC 81 MG tablet Take 81 mg by mouth every morning.   Biotin 10 MG Caps Take 1 capsule by mouth daily.   budesonide 0.25 MG/2ML nebulizer solution Commonly known as: PULMICORT Take 2 mLs (0.25 mg total) by nebulization  2 (two) times daily.   docusate sodium 100 MG capsule Commonly known as: COLACE Take 100 mg by mouth 3 (three) times daily.   DULoxetine 60 MG capsule Commonly known as: CYMBALTA Take 60 mg by mouth 2 (two) times daily as needed (mood).   hydrOXYzine 25 MG tablet Commonly known as: ATARAX/VISTARIL Take 25 mg by mouth 3 (three) times daily as needed for anxiety.   insulin aspart  100 UNIT/ML injection Commonly known as: novoLOG CBG < 70: implement hypoglycemia protocol CBG 70 - 120: 0 units CBG 121 - 150: 3 units CBG 151 - 200: 4 units CBG 201 - 250: 7 units CBG 251 - 300: 11 units CBG 301 - 350: 15 units CBG 351 - 400: 20 units   insulin glargine 100 UNIT/ML injection Commonly known as: LANTUS Inject 0.45 mLs (45 Units total) into the skin 2 (two) times daily. What changed:  how much to take when to take this   ipratropium-albuterol 0.5-2.5 (3) MG/3ML Soln Commonly known as: DUONEB Take 3 mLs by nebulization every 4 (four) hours as needed.   isosorbide mononitrate 30 MG 24 hr tablet Commonly known as: IMDUR Take 1 tablet (30 mg total) by mouth daily.   mirabegron ER 25 MG Tb24 tablet Commonly known as: MYRBETRIQ Take 1 tablet by mouth 2 (two) times daily.   multivitamins ther. w/minerals Tabs tablet Take 1 tablet by mouth every morning.   nystatin powder Commonly known as: MYCOSTATIN/NYSTOP Apply topically 2 (two) times daily.   pravastatin 40 MG tablet Commonly known as: PRAVACHOL Take 40 mg by mouth daily.   propranolol 60 MG tablet Commonly known as: INDERAL Take 60 mg by mouth daily. On an empty stomach   Slow-Mag 64 MG Tbec SR tablet Generic drug: magnesium chloride Take 1 tablet by mouth daily.   Victoza 18 MG/3ML Sopn Generic drug: liraglutide Inject 1.8 mg into the skin daily.   Vitamin D3 50 MCG (2000 UT) Tabs Take 1 tablet by mouth daily.               Durable Medical Equipment  (From admission, onward)           Start     Ordered   04/27/21 1414  For home use only DME Hospital bed  Once       Question Answer Comment  Length of Need Lifetime   Patient has (list medical condition): acute on chronic respiratory failure/ obesity/ dislocated shoulder   The above medical condition requires: Patient requires the ability to reposition frequently   Head must be elevated greater than: 30 degrees   Bed type  Semi-electric   Hoyer Lift Yes   Support Surface: Gel Overlay      04/27/21 1416            Contact information for follow-up providers     [facility].   Specialty: Home Health Services Why: PT/RN/Aide will call to schedule first home visit.        [facility] Follow up.   Why: Hospital bed with [facility] lift.        [facility] Follow up in 2 week(s).   Contact information: [address] [place] [address] [phone]              Contact information for after-discharge care     Destination     [facility] Preferred SNF .   Service: Skilled Nursing Contact information: [phone]  [address] [place] [address] [phone]                    Allergies  Allergen Reactions   Bee Venom Hives    Consultations: Orthopedics   Procedures/Studies: CT ABDOMEN PELVIS WO CONTRAST  Result Date: 04/24/2021 CLINICAL DATA:  Abdominal pain and fever. Acute on chronic renal failure. Previous hysterectomy. EXAM: CT ABDOMEN AND PELVIS WITHOUT CONTRAST TECHNIQUE: Multidetector CT imaging of the abdomen and pelvis was performed following the standard protocol without IV contrast. COMPARISON:  04/11/2014 FINDINGS: Lower chest: No acute findings. Hepatobiliary: No mass visualized on this unenhanced exam. Gallbladder is unremarkable. No evidence of biliary ductal dilatation. Pancreas: No mass or inflammatory process visualized on this unenhanced exam. Spleen:  Within normal limits in size. Adrenals/Urinary tract: No evidence of urolithiasis or hydronephrosis. Increased symmetric bilateral perinephric stranding is noted, and consistent with medical renal disease. Image degradation by motion artifact noted. Several lesions are seen in both kidneys which have increased in size since previous study, but cannot be characterized on this unenhanced exam. Urinary bladder is empty with Foley  catheter in place. Stomach/Bowel: No evidence of obstruction, inflammatory process, or abnormal fluid collections. Vascular/Lymphatic: No pathologically enlarged lymph nodes identified. No evidence of abdominal aortic aneurysm. Aortic atherosclerotic calcification noted. Reproductive: Prior hysterectomy noted. Adnexal regions are unremarkable in appearance. Other: A small paraumbilical ventral hernia is again seen which contains only fat. New moderate diffuse body wall edema is also seen. Musculoskeletal:  No suspicious bone lesions identified. IMPRESSION: No evidence of urolithiasis or hydronephrosis. Increased symmetric bilateral perinephric stranding, which is nonspecific but likely secondary to medical renal disease. Increased size of several bilateral renal lesions, which cannot be characterized on this unenhanced exam. Abdomen MRI is recommended for further characterization, preferably without and with contrast if renal function permits. New diffuse body wall edema. Stable small paraumbilical ventral hernia containing only fat. Electronically Signed   By: Danae Orleans M.D.   On: 04/24/2021 19:42   DG Chest 1 View  Result Date: 04/22/2021 CLINICAL DATA:  Fall today. PT c/o pain in pelvis and Rt shoulder/Rt upper chest since fall. Hx COPD, nonsmoker, diabetes, HTN, obesity EXAM: CHEST  1 VIEW COMPARISON:  04/15/2021 FINDINGS: Lungs are clear. Heart size upper limits normal. Aortic Atherosclerosis (ICD10-170.0). No effusion.  No pneumothorax. Right shoulder dislocation IMPRESSION: No acute cardiopulmonary disease. Right shoulder dislocation Electronically Signed   By: Corlis Leak M.D.   On: 04/22/2021 10:10   DG Pelvis 1-2 Views  Result Date: 04/22/2021 CLINICAL DATA:  Pain post fall EXAM: PELVIS - 1-2 VIEW COMPARISON:  CT 04/11/2014 FINDINGS: There is no evidence of pelvic fracture or diastasis. The right femoral neck is not well profiled. Chronic osteitis pubis. Degenerative changes in the lower lumbar  spine. Surgical clips in the left lower quadrant. Bilateral pelvic phleboliths. No pelvic bone lesions are seen. IMPRESSION: No acute findings. Electronically Signed   By: Corlis Leak M.D.   On: 04/22/2021 10:12   DG Shoulder Right  Result Date: 04/25/2021 CLINICAL DATA:  Right shoulder reduction. EXAM: DG C-ARM 1-60 MIN; RIGHT SHOULDER - 2+ VIEW FLUOROSCOPY TIME:  Fluoroscopy Time:  11 seconds. Number of Acquired Spot Images: 3 COMPARISON:  04/24/2021. FINDINGS: Three C-arm fluoroscopic images were obtained intraoperatively and submitted for post operative interpretation. These images demonstrate reduction of the right shoulder a which appears to be located but potentially slightly superiorly subluxed. Fracture fragment better characterized on prior radiographs. Please see the performing provider's  procedural report for further detail. IMPRESSION: Intraoperative fluoroscopy, as detailed above. Electronically Signed   By: Feliberto Harts MD   On: 04/25/2021 15:47   DG Shoulder Right  Result Date: 04/24/2021 CLINICAL DATA:  Anterior shoulder dislocation patient reports dislocated right shoulder yesterday with continued pain. EXAM: RIGHT SHOULDER - 2+ VIEW COMPARISON:  Shoulder radiograph 04/22/2021. Procedural fluoroscopy 04/22/2021 during shoulder dislocation reduction. FINDINGS: Recurrent anterior dislocation of the humeral head with respect to the glenoid. Small displaced fracture fragments laterally, donor site may be the lateral humeral head, however fragments also project adjacent to the glenoid on the [place] view. IMPRESSION: Recurrent anterior shoulder dislocation. Small displaced fracture fragments laterally, donor site may be the lateral humeral head, however fragments also project adjacent to the glenoid on the [place] view. Electronically Signed   By: Narda Rutherford M.D.   On: 04/24/2021 17:41   DG Shoulder Right  Result Date: 04/22/2021 CLINICAL DATA:  Right shoulder dislocation. EXAM: DG  C-ARM 1-60 MIN; RIGHT SHOULDER - 2+ VIEW FLUOROSCOPY TIME:  Fluoroscopy Time:  48 seconds. Number of Acquired Spot Images: 3 COMPARISON:  04/22/2021. FINDINGS: Three C-arm fluoroscopic images were obtained intraoperatively and submitted for post operative interpretation. These images demonstrate closed reduction of the right shoulder. The shoulder appears to be located. Please see the performing provider's procedural report for further detail. IMPRESSION: Intraoperative fluoroscopy during shoulder reduction. The right shoulder appears to be located. Post reduction radiographs could confirm and better evaluate for fracture. Electronically Signed   By: Feliberto Harts MD   On: 04/22/2021 16:25   DG Shoulder Right  Result Date: 04/22/2021 CLINICAL DATA:  Pain post fall EXAM: RIGHT SHOULDER - 2+ VIEW COMPARISON:  None. FINDINGS: Anterior right shoulder dislocation. Small cortical fragments project at the posterior margin of the glenoid. Proximal humerus appears intact. IMPRESSION: Anterior shoulder dislocation Electronically Signed   By: Corlis Leak M.D.   On: 04/22/2021 10:13   CT Head Wo Contrast  Result Date: 04/22/2021 CLINICAL DATA:  Larey Seat, pain EXAM: CT HEAD WITHOUT CONTRAST TECHNIQUE: Contiguous axial images were obtained from the base of the skull through the vertex without intravenous contrast. COMPARISON:  02/07/2012 FINDINGS: Brain: Mild parenchymal atrophy. Patchy areas of hypoattenuation in deep and periventricular white matter bilaterally. Negative for acute intracranial hemorrhage, mass lesion, acute infarction, midline shift, or mass-effect. Acute infarct may be inapparent on noncontrast CT. Ventricles and sulci symmetric. Vascular: No hyperdense vessel or unexpected calcification. Skull: Normal. Negative for fracture or focal lesion. Sinuses/Orbits: No acute finding. Other: None IMPRESSION: 1. Negative for bleed or other acute intracranial process. 2. Atrophy and nonspecific white matter  changes. Electronically Signed   By: Corlis Leak M.D.   On: 04/22/2021 10:09   CT CHEST WO CONTRAST  Result Date: 04/22/2021 CLINICAL DATA:  Dyspnea, chronic, unclear etiology Respiratory illness, nondiagnostic xray EXAM: CT CHEST WITHOUT CONTRAST TECHNIQUE: Multidetector CT imaging of the chest was performed following the standard protocol without IV contrast. COMPARISON:  CT chest abdomen pelvis 04/11/2014 FINDINGS: Cardiovascular: Normal heart size. Trace pericardial effusion. The thoracic aorta is normal in caliber. At least mild atherosclerotic plaque of the thoracic aorta. At least 2 vessel coronary artery calcifications. Question enlarged main pulmonary artery. Mediastinum/Nodes: No gross hilar adenopathy, noting limited sensitivity for the detection of hilar adenopathy on this noncontrast study. No enlarged mediastinal or axillary lymph nodes. Thyroid gland, trachea, and esophagus demonstrate no significant findings. Lungs/Pleura: Bilateral dependent posterior subsegmental atelectasis. Bilateral lower lobe base linear atelectasis versus scarring. No focal  consolidation. Few scattered calcified and noncalcified pulmonary micronodules. No pulmonary mass. No pleural effusion. No pneumothorax. Upper Abdomen: There is a 1.6 cm fluid density lesion at the superior pole of the left kidney likely represents a simple renal cyst. Otherwise no acute abnormality. Musculoskeletal: No chest wall abnormality. No suspicious lytic or blastic osseous lesions. No acute displaced fracture. Multilevel degenerative changes of the spine. Old posterior left third rib fracture. IMPRESSION: 1. No acute intrathoracic abnormality with limited evaluation on this noncontrast study. 2. Question enlarged main pulmonary artery suggestive of pulmonary hypertension. 3. Aortic Atherosclerosis (ICD10-I70.0) including coronary calcifications. Electronically Signed   By: Tish Frederickson M.D.   On: 04/22/2021 20:52   CT Angio Chest Pulmonary  Embolism (PE) W or WO Contrast  Result Date: 04/27/2021 CLINICAL DATA:  Acute on chronic respiratory failure with hypoxia, elevated d-dimer. EXAM: CT ANGIOGRAPHY CHEST WITH CONTRAST TECHNIQUE: Multidetector CT imaging of the chest was performed using the standard protocol during bolus administration of intravenous contrast. Multiplanar CT image reconstructions and MIPs were obtained to evaluate the vascular anatomy. CONTRAST:  OMNIPAQUE IOHEXOL 350 MG/ML SOLN COMPARISON:  CT angiography chest 04/22/2021, CT abdomen pelvis 04/24/2021 FINDINGS: Cardiovascular: Fairly satisfactory opacification of the pulmonary arteries to the segmental level. No evidence of pulmonary embolism. Normal heart size. No significant pericardial effusion. The thoracic aorta is normal in caliber. Mild atherosclerotic plaque of the thoracic aorta. Coronary artery calcifications. Mediastinum/Nodes: No enlarged mediastinal, hilar, or axillary lymph nodes. Thyroid gland, trachea, and esophagus demonstrate no significant findings. Lungs/Pleura: Bilateral lower lobe subsegmental atelectasis. No focal consolidation. No pulmonary nodule. No pulmonary mass. No pleural effusion. No pneumothorax. Upper Abdomen: Poorly visualized evaluated 2.1 cm density within left kidney with a no acute abnormality. Musculoskeletal: No abdominal wall hernia or abnormality. No suspicious lytic or blastic osseous lesions. No acute displaced fracture. Multilevel degenerative changes of the spine. Review of the MIP images confirms the above findings. IMPRESSION: 1. No central or proximal segmental pulmonary embolus. Limited evaluation more distally due to timing of contrast. 2. No acute intrathoracic abnormality. 3. Indeterminate 2.1 cm left renal lesion. Recommend MRI renal protocol for further evaluation. 4. Aortic Atherosclerosis (ICD10-I70.0) -including coronary artery calcifications. Electronically Signed   By: Tish Frederickson M.D.   On: 04/27/2021 23:47   US  RENAL  Result Date: 04/22/2021 CLINICAL DATA:  Dyspnea.  Chronic stage III A renal disease. EXAM: RENAL / URINARY TRACT ULTRASOUND COMPLETE COMPARISON:  None. FINDINGS: Right Kidney: Renal measurements: 13.2 x 6.5 x 6.5 cm = volume: 292 mL. Echogenicity within normal limits. No mass or hydronephrosis visualized. Left Kidney: Limited evaluation. Renal measurements: 14.7 x 6.8 x 8.1 cm= volume: 423 mL. Echogenicity within normal limits. There is a 3.7 x 3.2 x 3.7 cm cystic lesion within the left kidney. No solid mass or hydronephrosis visualized. Urinary bladder: Appears normal for degree of bladder distention. Other: None. IMPRESSION: Grossly unremarkable renal ultrasound with a 3.7 cm left simple renal cyst. Please note otherwise limited evaluation of the left kidney. Electronically Signed   By: Tish Frederickson M.D.   On: 04/22/2021 20:05   US Venous Img Lower Bilateral (DVT)  Result Date: 04/23/2021 CLINICAL DATA:  Chronic lower extremity pain and edema EXAM: BILATERAL LOWER EXTREMITY VENOUS DOPPLER ULTRASOUND TECHNIQUE: Gray-scale sonography with graded compression, as well as color Doppler and duplex ultrasound were performed to evaluate the lower extremity deep venous systems from the level of the common femoral vein and including the common femoral, femoral, profunda femoral, popliteal and  calf veins including the posterior tibial, peroneal and gastrocnemius veins when visible. The superficial great saphenous vein was also interrogated. Spectral Doppler was utilized to evaluate flow at rest and with distal augmentation maneuvers in the common femoral, femoral and popliteal veins. COMPARISON:  None. FINDINGS: RIGHT LOWER EXTREMITY Common Femoral Vein: No evidence of thrombus. Normal compressibility, respiratory phasicity and response to augmentation. Saphenofemoral Junction: No evidence of thrombus. Normal compressibility and flow on color Doppler imaging. Profunda Femoral Vein: No evidence of thrombus.  Normal compressibility and flow on color Doppler imaging. Femoral Vein: No evidence of thrombus. Normal compressibility, respiratory phasicity and response to augmentation. Popliteal Vein: No evidence of thrombus. Normal compressibility, respiratory phasicity and response to augmentation. Calf Veins: Limited visualization because of body habitus. Tibial peroneal veins appear grossly patent and compressible. LEFT LOWER EXTREMITY Common Femoral Vein: No evidence of thrombus. Normal compressibility, respiratory phasicity and response to augmentation. Saphenofemoral Junction: No evidence of thrombus. Normal compressibility and flow on color Doppler imaging. Profunda Femoral Vein: No evidence of thrombus. Normal compressibility and flow on color Doppler imaging. Femoral Vein: No evidence of thrombus. Normal compressibility, respiratory phasicity and response to augmentation. Popliteal Vein: No evidence of thrombus. Normal compressibility, respiratory phasicity and response to augmentation. Calf Veins: Again limited because of body habitus. Posterior tibial vein appears patent. Peroneal vein not visualized. IMPRESSION: No significant femoropopliteal DVT in either extremity. Limited assessment of the calf veins because of body habitus. Electronically Signed   By: Judie Petit.  Shick M.D.   On: 04/23/2021 08:02   DG CHEST PORT 1 VIEW  Result Date: 04/27/2021 CLINICAL DATA:  Short of breath, COPD EXAM: PORTABLE CHEST 1 VIEW COMPARISON:  04/23/2021 FINDINGS: Single frontal view of the chest demonstrates an enlarged cardiac silhouette. There is central vascular congestion without airspace disease, effusion, or pneumothorax. No acute bony abnormalities. IMPRESSION: 1. Central vascular congestion without overt edema. Electronically Signed   By: Sharlet Salina M.D.   On: 04/27/2021 16:20   DG CHEST PORT 1 VIEW  Result Date: 04/23/2021 CLINICAL DATA:  Shortness of breath.  Acute respiratory failure. EXAM: PORTABLE CHEST 1 VIEW  COMPARISON:  April 22, 2021 FINDINGS: Stable cardiomegaly. The hila and mediastinum are not significantly changed given the low volume rotated portable technique. Increased interstitial markings bilaterally. Mild opacity in the bases, likely atelectasis. No other acute abnormalities. IMPRESSION: Findings are favored to represent cardiomegaly with pulmonary venous congestion/mild edema and bibasilar opacities, likely atelectasis. Electronically Signed   By: Gerome Sam III M.D   On: 04/23/2021 11:29   DG Chest Port 1 View  Result Date: 04/15/2021 CLINICAL DATA:  sob EXAM: PORTABLE CHEST 1 VIEW COMPARISON:  April 16, 2014 FINDINGS: The cardiomediastinal silhouette is unchanged and enlarged in contour.Atherosclerotic calcifications. No pleural effusion. No pneumothorax. Perihilar vascular prominence without overt edema. Visualized abdomen is unremarkable. No acute osseous abnormality. IMPRESSION: No acute cardiopulmonary abnormality. Electronically Signed   By: Meda Klinefelter MD   On: 04/15/2021 15:55   DG C-Arm 1-60 Min  Result Date: 04/25/2021 CLINICAL DATA:  Right shoulder reduction. EXAM: DG C-ARM 1-60 MIN; RIGHT SHOULDER - 2+ VIEW FLUOROSCOPY TIME:  Fluoroscopy Time:  11 seconds. Number of Acquired Spot Images: 3 COMPARISON:  04/24/2021. FINDINGS: Three C-arm fluoroscopic images were obtained intraoperatively and submitted for post operative interpretation. These images demonstrate reduction of the right shoulder a which appears to be located but potentially slightly superiorly subluxed. Fracture fragment better characterized on prior radiographs. Please see the performing provider's procedural report for further  detail. IMPRESSION: Intraoperative fluoroscopy, as detailed above. Electronically Signed   By: Feliberto Harts MD   On: 04/25/2021 15:47   DG C-Arm 1-60 Min  Result Date: 04/22/2021 CLINICAL DATA:  Right shoulder dislocation. EXAM: DG C-ARM 1-60 MIN; RIGHT SHOULDER - 2+ VIEW FLUOROSCOPY  TIME:  Fluoroscopy Time:  48 seconds. Number of Acquired Spot Images: 3 COMPARISON:  04/22/2021. FINDINGS: Three C-arm fluoroscopic images were obtained intraoperatively and submitted for post operative interpretation. These images demonstrate closed reduction of the right shoulder. The shoulder appears to be located. Please see the performing provider's procedural report for further detail. IMPRESSION: Intraoperative fluoroscopy during shoulder reduction. The right shoulder appears to be located. Post reduction radiographs could confirm and better evaluate for fracture. Electronically Signed   By: Feliberto Harts MD   On: 04/22/2021 16:25   ECHOCARDIOGRAM COMPLETE  Result Date: 04/24/2021    ECHOCARDIOGRAM REPORT   Patient Name:   [name] Date of Exam: 04/23/2021 Medical Rec #:  [id number]       Height:       68.0 in Accession #:    [id number]      Weight:       346.8 lb Date of Birth:  [birth year]       BSA:          2.582 m Patient Age:    55 years        BP:           160/67 mmHg Patient Gender: F               HR:           94 bpm. Exam Location:  Inpatient Procedure: 2D Echo, Cardiac Doppler and Color Doppler Indications:    Dyspnea R06.00  History:        Patient has prior history of Echocardiogram examinations, most                 recent 04/19/2014. COPD; Risk Factors:Hypertension and Diabetes.                 Parkinson's Disease. Seizures.  Sonographer:    Tiffany Dance Referring Phys: [phone] DAVID TAT  Sonographer Comments: Echo performed with patient supine and on artificial respirator and Technically difficult study due to poor echo windows. IMPRESSIONS  1. Left ventricular ejection fraction, by estimation, is 70 to 75%. The left ventricle has hyperdynamic function. The left ventricle has no regional wall motion abnormalities. There is mild left ventricular hypertrophy. Left ventricular diastolic parameters are indeterminate.  2. Right ventricular systolic function is mildly reduced. The right  ventricular size is mildly enlarged. Tricuspid regurgitation signal is inadequate for assessing PA pressure.  3. Left atrial size was mildly dilated.  4. Right atrial size was mildly dilated.  5. A small pericardial effusion is present.  6. The mitral valve is degenerative. Trivial mitral valve regurgitation. Mild mitral stenosis. The mean mitral valve gradient is 4.6 mmHg with average heart rate of 95 bpm. Severe mitral annular calcification.  7. The aortic valve is grossly normal. There is mild calcification of the aortic valve. Aortic valve regurgitation is not visualized. No aortic stenosis is present.  8. Increased flow velocities may be secondary to anemia, thyrotoxicosis, hyperdynamic or high flow state. 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 Left ventricular ejection fraction, by estimation, is 70 to 75%. The left ventricle has hyperdynamic function. The left ventricle has no regional wall  motion abnormalities.  The left ventricular internal cavity size was normal in size. There is mild left ventricular hypertrophy. Left ventricular diastolic parameters are indeterminate. Right Ventricle: The right ventricular size is mildly enlarged. Right vetricular wall thickness was not well visualized. Right ventricular systolic function is mildly reduced. Tricuspid regurgitation signal is inadequate for assessing PA pressure. Left Atrium: Left atrial size was mildly dilated. Right Atrium: Right atrial size was mildly dilated. Pericardium: A small pericardial effusion is present. Mitral Valve: The mitral valve is degenerative in appearance. Severe mitral annular calcification. Trivial mitral valve regurgitation. Mild mitral valve stenosis. The mean mitral valve gradient is 4.6 mmHg with average heart rate of 95 bpm. Tricuspid Valve: The tricuspid valve is grossly normal. Tricuspid valve regurgitation is trivial. No evidence of tricuspid stenosis. Aortic Valve: The  aortic valve is grossly normal. There is mild calcification of the aortic valve. Aortic valve regurgitation is not visualized. No aortic stenosis is present. Pulmonic Valve: The pulmonic valve was grossly normal. Pulmonic valve regurgitation is trivial. No evidence of pulmonic stenosis. Aorta: The aortic root is normal in size and structure. Venous: The inferior vena cava was not well visualized. IVC assessment for right atrial pressure unable to be performed due to mechanical ventilation. IAS/Shunts: No atrial level shunt detected by color flow Doppler.  LEFT VENTRICLE PLAX 2D LVIDd:         4.04 cm  Diastology LVIDs:         2.71 cm  LV e' medial:    6.22 cm/s LV PW:         1.30 cm  LV E/e' medial:  19.9 LV IVS:        1.11 cm  LV e' lateral:   9.20 cm/s LVOT diam:     1.90 cm  LV E/e' lateral: 13.5 LV SV:         106 LV SV Index:   41 LVOT Area:     2.84 cm  RIGHT VENTRICLE RV Basal diam:  3.98 cm RV Mid diam:    2.75 cm RV S prime:     16.20 cm/s TAPSE (M-mode): 2.0 cm LEFT ATRIUM             Index       RIGHT ATRIUM           Index LA diam:        3.30 cm 1.28 cm/m  RA Area:     18.60 cm LA Vol (A2C):   65.4 ml 25.33 ml/m RA Volume:   55.10 ml  21.34 ml/m LA Vol (A4C):   41.0 ml 15.88 ml/m LA Biplane Vol: 50.1 ml 19.40 ml/m  AORTIC VALVE LVOT Vmax:   181.00 cm/s LVOT Vmean:  124.000 cm/s LVOT VTI:    0.375 m  AORTA Ao Root diam: 3.10 cm Ao Asc diam:  3.50 cm MITRAL VALVE MV Area (PHT): 2.29 cm     SHUNTS MV Area VTI:   3.14 cm     Systemic VTI:  0.38 m MV Mean grad:  4.6 mmHg     Systemic Diam: 1.90 cm MV VTI:        0.34 m MV Decel Time: 331 msec MV E velocity: 124.00 cm/s MV A velocity: 144.00 cm/s MV E/A ratio:  0.86 Weston Brass MD Electronically signed by Weston Brass MD Signature Date/Time: 04/24/2021/9:10:32 AM    Final      Discharge Exam: Vitals:   05/04/21 0451 05/04/21 0831  BP: 115/81  Pulse: 88   Resp: 19   Temp: 98.2 F (36.8 C)   SpO2: 97% 94%   Vitals:   05/03/21  1935 05/03/21 2056 05/04/21 0451 05/04/21 0831  BP:  122/77 115/81   Pulse:  83 88   Resp:  19 19   Temp:  98.8 F (37.1 C) 98.2 F (36.8 C)   TempSrc:  Oral Oral   SpO2: 94% 96% 97% 94%  Weight:      Height:        General: Pt is alert, awake, not in acute distress, obese Cardiovascular: RRR, S1/S2 +, no rubs, no gallops Respiratory: CTA bilaterally, no wheezing, no rhonchi, nasal cannula oxygen Abdominal: Soft, NT, ND, bowel sounds + Extremities: no edema, no cyanosis, right upper extremity in sling    The results of significant diagnostics from this hospitalization (including imaging, microbiology, ancillary and laboratory) are listed below for reference.     Microbiology: Recent Results (from the past 240 hour(s))  Respiratory (~20 pathogens) panel by PCR     Status: None   Collection Time: 04/25/21  8:05 PM   Specimen: Nasopharyngeal Swab; Respiratory  Result Value Ref Range Status   Adenovirus NOT DETECTED NOT DETECTED Final   Coronavirus 229E NOT DETECTED NOT DETECTED Final    Comment: (NOTE) The Coronavirus on the Respiratory Panel, DOES NOT test for the novel  Coronavirus (2019 nCoV)    Coronavirus HKU1 NOT DETECTED NOT DETECTED Final   Coronavirus NL63 NOT DETECTED NOT DETECTED Final   Coronavirus OC43 NOT DETECTED NOT DETECTED Final   Metapneumovirus NOT DETECTED NOT DETECTED Final   Rhinovirus / Enterovirus NOT DETECTED NOT DETECTED Final   Influenza A NOT DETECTED NOT DETECTED Final   Influenza B NOT DETECTED NOT DETECTED Final   Parainfluenza Virus 1 NOT DETECTED NOT DETECTED Final   Parainfluenza Virus 2 NOT DETECTED NOT DETECTED Final   Parainfluenza Virus 3 NOT DETECTED NOT DETECTED Final   Parainfluenza Virus 4 NOT DETECTED NOT DETECTED Final   Respiratory Syncytial Virus NOT DETECTED NOT DETECTED Final   Bordetella pertussis NOT DETECTED NOT DETECTED Final   Bordetella Parapertussis NOT DETECTED NOT DETECTED Final   Chlamydophila pneumoniae NOT  DETECTED NOT DETECTED Final   Mycoplasma pneumoniae NOT DETECTED NOT DETECTED Final    Comment: Performed at [facility], [address] [place], [place] [address]     Labs: BNP (last 3 results) Recent Labs    04/22/21 0855 04/24/21 0444 04/26/21 0355  BNP 606.0* 69.0 73.0   Basic Metabolic Panel: Recent Labs  Lab 04/28/21 0329 04/29/21 0413 05/02/21 0607  NA 136  --  140  K 4.0  --  3.8  CL 92*  --  95*  CO2 33*  --  34*  GLUCOSE 275*  --  151*  BUN 26*  --  26*  CREATININE 1.39* 1.18* 1.29*  CALCIUM 8.8*  --  9.4  PHOS 2.6  --   --    Liver Function Tests: Recent Labs  Lab 04/28/21 0329  ALBUMIN 2.9*   No results for input(s): LIPASE, AMYLASE in the last 168 hours. No results for input(s): AMMONIA in the last 168 hours. CBC: Recent Labs  Lab 05/02/21 0607  WBC 4.6  HGB 14.3  HCT 49.1*  MCV 94.1  PLT 195   Cardiac Enzymes: No results for input(s): CKTOTAL, CKMB, CKMBINDEX, TROPONINI in the last 168 hours. BNP: Invalid input(s): POCBNP CBG: Recent Labs  Lab 05/03/21 0728 05/03/21 1104 05/03/21  1608 05/03/21 2206 05/04/21 0722  GLUCAP 127* 214* 100* 108* 133*   D-Dimer No results for input(s): DDIMER in the last 72 hours. Hgb A1c No results for input(s): HGBA1C in the last 72 hours. Lipid Profile No results for input(s): CHOL, HDL, LDLCALC, TRIG, CHOLHDL, LDLDIRECT in the last 72 hours. Thyroid function studies No results for input(s): TSH, T4TOTAL, T3FREE, THYROIDAB in the last 72 hours.  Invalid input(s): FREET3 Anemia work up No results for input(s): VITAMINB12, FOLATE, FERRITIN, TIBC, IRON, RETICCTPCT in the last 72 hours. Urinalysis    Component Value Date/Time   COLORURINE YELLOW (A) 05/01/2017 1852   APPEARANCEUR CLEAR (A) 05/01/2017 1852   LABSPEC 1.023 05/01/2017 1852   PHURINE 5.0 05/01/2017 1852   GLUCOSEU >=500 (A) 05/01/2017 1852   HGBUR NEGATIVE 05/01/2017 1852   BILIRUBINUR NEGATIVE 05/01/2017 1852   KETONESUR  NEGATIVE 05/01/2017 1852   PROTEINUR 100 (A) 05/01/2017 1852   UROBILINOGEN 0.2 04/11/2014 1927   NITRITE NEGATIVE 05/01/2017 1852   LEUKOCYTESUR NEGATIVE 05/01/2017 1852   Sepsis Labs Invalid input(s): PROCALCITONIN,  WBC,  LACTICIDVEN Microbiology Recent Results (from the past 240 hour(s))  Respiratory (~20 pathogens) panel by PCR     Status: None   Collection Time: 04/25/21  8:05 PM   Specimen: Nasopharyngeal Swab; Respiratory  Result Value Ref Range Status   Adenovirus NOT DETECTED NOT DETECTED Final   Coronavirus 229E NOT DETECTED NOT DETECTED Final    Comment: (NOTE) The Coronavirus on the Respiratory Panel, DOES NOT test for the novel  Coronavirus (2019 nCoV)    Coronavirus HKU1 NOT DETECTED NOT DETECTED Final   Coronavirus NL63 NOT DETECTED NOT DETECTED Final   Coronavirus OC43 NOT DETECTED NOT DETECTED Final   Metapneumovirus NOT DETECTED NOT DETECTED Final   Rhinovirus / Enterovirus NOT DETECTED NOT DETECTED Final   Influenza A NOT DETECTED NOT DETECTED Final   Influenza B NOT DETECTED NOT DETECTED Final   Parainfluenza Virus 1 NOT DETECTED NOT DETECTED Final   Parainfluenza Virus 2 NOT DETECTED NOT DETECTED Final   Parainfluenza Virus 3 NOT DETECTED NOT DETECTED Final   Parainfluenza Virus 4 NOT DETECTED NOT DETECTED Final   Respiratory Syncytial Virus NOT DETECTED NOT DETECTED Final   Bordetella pertussis NOT DETECTED NOT DETECTED Final   Bordetella Parapertussis NOT DETECTED NOT DETECTED Final   Chlamydophila pneumoniae NOT DETECTED NOT DETECTED Final   Mycoplasma pneumoniae NOT DETECTED NOT DETECTED Final    Comment: Performed at [facility], [address] [place], [place] [id number]     Time coordinating discharge: 35 minutes  SIGNED:   Erick BlinksPratik D Dennies Coate, DO [facility] 05/04/2021, 11:03 AM  If 7PM-7AM, please contact night-coverage [email or web address]

## 2021-05-03 NOTE — Progress Notes (Signed)
PROGRESS NOTE    [name]  WYO:[id number] DOB: 16-Aug-1966 DOA: 04/22/2021 PCP: [facility]   Brief Narrative:   55 y.o. female with medical history of current impairment, bipolar disorder, seizure disorder, hypertension, COPD, chronic respiratory failure on 3 L, diabetes mellitus type 2, CKD stage III presenting with mechanical fall on the a.m. of 04/22/2021. She was found to have anterior right shoulder dislocation.   Assessment & Plan:   Active Problems:   Acute on chronic respiratory failure with hypoxia (HCC)   Acute renal failure superimposed on stage 3a chronic kidney disease (HCC)   Anterior shoulder dislocation, right, initial encounter   Acute on chronic respiratory failure with hypoxia and hypercapnia (HCC)   Traumatic closed displaced fracture of right shoulder with anterior dislocation   Closed fracture dislocation of joint of right shoulder girdle   AKI (acute kidney injury) (HCC)   Acute on chronic respiratory failure with hypoxia and hypercarbia. Secondary to OSA/OHS, on 3 lit of [place] oxygen, currently on 4 lit of [place] oxygen. CT angio neg for PE, Echocardiogram showed LVEF of 70% ,no wall motion abnormalities. Recommend outpatient follow up with pulmonology for PFT's and sleep study.     Anterior right shoulder dislocation:  Orthopedics, Dr. Romeo Apple consult appreciated -7/29- and again on 04/25/21 -closed reduction of shoulder dislocation Keep sling in place for atleast 2 weeks. Follow up with orthopedics in 2 weeks post discharge.     Fever-resolved So far work up has been negative.     Uncontrolled DM type 2: Insulin dependent. Uncontrolled with hyperglycemia:  Increase the novolog and lantus.  Body mass index is 51.89 kg/m. Morbid obesity     Hypertension; Well controlled on Imdur and Inderal.     Vit b12 deficiency: Supplementation given.     Hyperlipidemia: Resume statin.     Bipolar disorder: Resume home  meds.     DVT prophylaxis:Heparin Code Status: Full Family Communication: None at bedside Disposition Plan:  Status is: Inpatient  Remains inpatient appropriate because:Unsafe d/c plan, IV treatments appropriate due to intensity of illness or inability to take PO, and Inpatient level of care appropriate due to severity of illness  Dispo: The patient is from: Home              Anticipated d/c is to: SNF              Patient currently is not medically stable to d/c.   Difficult to place patient No  Consultants:  None  Procedures:  See below  Antimicrobials:  Anti-infectives (From admission, onward)    Start     Dose/Rate Route Frequency Ordered Stop   04/23/21 2200  ceFEPIme (MAXIPIME) 2 g in sodium chloride 0.9 % 100 mL IVPB  Status:  Discontinued        2 g 200 mL/hr over 30 Minutes Intravenous Every 12 hours 04/23/21 1743 04/23/21 1743   04/23/21 1830  ceFEPIme (MAXIPIME) 2 g in sodium chloride 0.9 % 100 mL IVPB  Status:  Discontinued        2 g 200 mL/hr over 30 Minutes Intravenous Every 12 hours 04/23/21 1743 04/25/21 1626       Subjective: Patient seen and evaluated today with no new acute complaints or concerns. No acute concerns or events noted overnight.  Objective: Vitals:   05/02/21 2110 05/03/21 0423 05/03/21 0727 05/03/21 1346  BP: 113/73 118/64  104/62  Pulse: 85 90  (!) 110  Resp: 19 19  20  Temp: 98.5 F (36.9 C) 98.7 F (37.1 C)  99.2 F (37.3 C)  TempSrc: Oral Oral    SpO2: 93% (!) 86% 90% 94%  Weight:      Height:        Intake/Output Summary (Last 24 hours) at 05/03/2021 1355 Last data filed at 05/03/2021 0900 Gross per 24 hour  Intake 600 ml  Output --  Net 600 ml   Filed Weights   04/23/21 1116 04/25/21 0400 04/26/21 0500  Weight: (!) 157.3 kg (!) 153.9 kg (!) 154.8 kg    Examination:  General exam: Appears calm and comfortable, obese Respiratory system: Clear to auscultation. Respiratory effort normal. Cardiovascular system: S1  & S2 heard, RRR.  Gastrointestinal system: Abdomen is soft Central nervous system: Alert and awake Extremities: No edema, R UE in sling Skin: No significant lesions noted Psychiatry: Flat affect.    Data Reviewed: I have personally reviewed following labs and imaging studies  CBC: Recent Labs  Lab 04/27/21 0453 05/02/21 0607  WBC 5.4 4.6  HGB 13.9 14.3  HCT 45.6 49.1*  MCV 90.1 94.1  PLT 173 195   Basic Metabolic Panel: Recent Labs  Lab 04/26/21 2154 04/27/21 0453 04/28/21 0329 04/29/21 0413 05/02/21 0607  NA  --  138 136  --  140  K  --  4.2 4.0  --  3.8  CL  --  93* 92*  --  95*  CO2  --  33* 33*  --  34*  GLUCOSE 382* 278* 275*  --  151*  BUN  --  25* 26*  --  26*  CREATININE  --  1.27* 1.39* 1.18* 1.29*  CALCIUM  --  8.7* 8.8*  --  9.4  PHOS  --   --  2.6  --   --    GFR: Estimated Creatinine Clearance: 77.1 mL/min (A) (by C-G formula based on SCr of 1.29 mg/dL (H)). Liver Function Tests: Recent Labs  Lab 04/28/21 0329  ALBUMIN 2.9*   No results for input(s): LIPASE, AMYLASE in the last 168 hours. No results for input(s): AMMONIA in the last 168 hours. Coagulation Profile: No results for input(s): INR, PROTIME in the last 168 hours. Cardiac Enzymes: No results for input(s): CKTOTAL, CKMB, CKMBINDEX, TROPONINI in the last 168 hours. BNP (last 3 results) No results for input(s): PROBNP in the last 8760 hours. HbA1C: No results for input(s): HGBA1C in the last 72 hours. CBG: Recent Labs  Lab 05/02/21 1125 05/02/21 1614 05/02/21 2131 05/03/21 0728 05/03/21 1104  GLUCAP 237* 253* 192* 127* 214*   Lipid Profile: No results for input(s): CHOL, HDL, LDLCALC, TRIG, CHOLHDL, LDLDIRECT in the last 72 hours. Thyroid Function Tests: No results for input(s): TSH, T4TOTAL, FREET4, T3FREE, THYROIDAB in the last 72 hours. Anemia Panel: No results for input(s): VITAMINB12, FOLATE, FERRITIN, TIBC, IRON, RETICCTPCT in the last 72 hours. Sepsis Labs: No  results for input(s): PROCALCITON, LATICACIDVEN in the last 168 hours.  Recent Results (from the past 240 hour(s))  Urine Culture     Status: None   Collection Time: 04/23/21  6:05 PM   Specimen: Urine, Clean Catch  Result Value Ref Range Status   Specimen Description   Final    URINE, CLEAN CATCH Performed at [facility], [address], [place], [place] [address]    Special Requests   Final    NONE Performed at [facility], [address], [place], [place] [address]    Culture   Final  NO GROWTH Performed at [facility], [address] [place], [place] [address]    Report Status 04/25/2021 FINAL  Final  Respiratory (~20 pathogens) panel by PCR     Status: None   Collection Time: 04/25/21  8:05 PM   Specimen: Nasopharyngeal Swab; Respiratory  Result Value Ref Range Status   Adenovirus NOT DETECTED NOT DETECTED Final   Coronavirus 229E NOT DETECTED NOT DETECTED Final    Comment: (NOTE) The Coronavirus on the Respiratory Panel, DOES NOT test for the novel  Coronavirus (2019 nCoV)    Coronavirus HKU1 NOT DETECTED NOT DETECTED Final   Coronavirus NL63 NOT DETECTED NOT DETECTED Final   Coronavirus OC43 NOT DETECTED NOT DETECTED Final   Metapneumovirus NOT DETECTED NOT DETECTED Final   Rhinovirus / Enterovirus NOT DETECTED NOT DETECTED Final   Influenza A NOT DETECTED NOT DETECTED Final   Influenza B NOT DETECTED NOT DETECTED Final   Parainfluenza Virus 1 NOT DETECTED NOT DETECTED Final   Parainfluenza Virus 2 NOT DETECTED NOT DETECTED Final   Parainfluenza Virus 3 NOT DETECTED NOT DETECTED Final   Parainfluenza Virus 4 NOT DETECTED NOT DETECTED Final   Respiratory Syncytial Virus NOT DETECTED NOT DETECTED Final   Bordetella pertussis NOT DETECTED NOT DETECTED Final   Bordetella Parapertussis NOT DETECTED NOT DETECTED Final   Chlamydophila pneumoniae NOT DETECTED NOT DETECTED Final   Mycoplasma pneumoniae NOT DETECTED NOT DETECTED Final    Comment: Performed at  [facility], [address] [place], [place] [address]       Radiology Studies: No results found.      Scheduled Meds:  ARIPiprazole  30 mg Oral Daily   aspirin EC  81 mg Oral q morning   budesonide (PULMICORT) nebulizer solution  0.25 mg Nebulization BID   clonazePAM  0.5 mg Oral BID   clotrimazole  10 mg Oral 5 X Daily   docusate sodium  100 mg Oral TID   heparin  5,000 Units Subcutaneous Q8H   insulin aspart  0-15 Units Subcutaneous TID WC   insulin aspart  0-5 Units Subcutaneous QHS   insulin aspart  20 Units Subcutaneous TID WC   insulin glargine-yfgn  42 Units Subcutaneous QHS   insulin glargine-yfgn  42 Units Subcutaneous Daily   isosorbide mononitrate  30 mg Oral Daily   mirabegron ER  25 mg Oral BID   nystatin   Topical BID   pravastatin  40 mg Oral q1800   propranolol  60 mg Oral Q1400   vitamin B-12  500 mcg Oral Daily     LOS: 11 days    Time spent: 35 minutes    Dalary Hollar Hoover Brunette, DO [facility]  If 7PM-7AM, please contact night-coverage [email or web address] 05/03/2021, 1:55 PM

## 2021-05-03 NOTE — Plan of Care (Signed)
  Problem: Education: Goal: Knowledge of General Education information will improve Description: Including pain rating scale, medication(s)/side effects and non-pharmacologic comfort measures 05/03/2021 1512 by Sheela Stack, RN Outcome: Adequate for Discharge 05/03/2021 1512 by Sheela Stack, RN Outcome: Adequate for Discharge   Problem: Health Behavior/Discharge Planning: Goal: Ability to manage health-related needs will improve 05/03/2021 1512 by Sheela Stack, RN Outcome: Adequate for Discharge 05/03/2021 1512 by Sheela Stack, RN Outcome: Adequate for Discharge   Problem: Clinical Measurements: Goal: Ability to maintain clinical measurements within normal limits will improve 05/03/2021 1512 by Sheela Stack, RN Outcome: Adequate for Discharge 05/03/2021 1512 by Sheela Stack, RN Outcome: Adequate for Discharge Goal: Will remain free from infection 05/03/2021 1512 by Sheela Stack, RN Outcome: Adequate for Discharge 05/03/2021 1512 by Sheela Stack, RN Outcome: Adequate for Discharge Goal: Diagnostic test results will improve 05/03/2021 1512 by Sheela Stack, RN Outcome: Adequate for Discharge 05/03/2021 1512 by Sheela Stack, RN Outcome: Adequate for Discharge Goal: Respiratory complications will improve 05/03/2021 1512 by Sheela Stack, RN Outcome: Adequate for Discharge 05/03/2021 1512 by Sheela Stack, RN Outcome: Adequate for Discharge Goal: Cardiovascular complication will be avoided 05/03/2021 1512 by Sheela Stack, RN Outcome: Adequate for Discharge 05/03/2021 1512 by Sheela Stack, RN Outcome: Adequate for Discharge   Problem: Activity: Goal: Risk for activity intolerance will decrease 05/03/2021 1512 by Sheela Stack, RN Outcome: Adequate for Discharge 05/03/2021 1512 by Sheela Stack, RN Outcome: Adequate for Discharge   Problem: Nutrition: Goal: Adequate nutrition will be maintained 05/03/2021 1512 by Sheela Stack, RN Outcome: Adequate for  Discharge 05/03/2021 1512 by Sheela Stack, RN Outcome: Adequate for Discharge   Problem: Coping: Goal: Level of anxiety will decrease 05/03/2021 1512 by Sheela Stack, RN Outcome: Adequate for Discharge 05/03/2021 1512 by Sheela Stack, RN Outcome: Adequate for Discharge   Problem: Elimination: Goal: Will not experience complications related to bowel motility 05/03/2021 1512 by Sheela Stack, RN Outcome: Adequate for Discharge 05/03/2021 1512 by Sheela Stack, RN Outcome: Adequate for Discharge Goal: Will not experience complications related to urinary retention 05/03/2021 1512 by Sheela Stack, RN Outcome: Adequate for Discharge 05/03/2021 1512 by Sheela Stack, RN Outcome: Adequate for Discharge   Problem: Pain Managment: Goal: General experience of comfort will improve 05/03/2021 1512 by Sheela Stack, RN Outcome: Adequate for Discharge 05/03/2021 1512 by Sheela Stack, RN Outcome: Adequate for Discharge   Problem: Safety: Goal: Ability to remain free from injury will improve 05/03/2021 1512 by Sheela Stack, RN Outcome: Adequate for Discharge 05/03/2021 1512 by Sheela Stack, RN Outcome: Adequate for Discharge   Problem: Skin Integrity: Goal: Risk for impaired skin integrity will decrease 05/03/2021 1512 by Sheela Stack, RN Outcome: Adequate for Discharge 05/03/2021 1512 by Sheela Stack, RN Outcome: Adequate for Discharge

## 2021-05-04 LAB — GLUCOSE, CAPILLARY
Glucose-Capillary: 133 mg/dL — ABNORMAL HIGH (ref 70–99)
Glucose-Capillary: 253 mg/dL — ABNORMAL HIGH (ref 70–99)

## 2021-05-04 NOTE — Progress Notes (Signed)
Patient seen and evaluated today with no new concerns or complaints noted.  She is awaiting transport to her SNF.  Please refer to discharge summary dictated 8/9 for full details.  Total care time: 10 minutes.

## 2021-05-05 NOTE — Telephone Encounter (Signed)
05/05/21 - Mr. Iran Ouch called back and stated patient is at [facility] in [place] and is bedridden.  He was going to check with the facility regarding their protocol of getting pt to appointments in a few days and call us back. Gave him options of transportation bringing patient or coming by ambulance on a stretcher since patient is bedridden.  ta

## 2021-05-05 NOTE — Telephone Encounter (Signed)
05/05/21 - lmom for Ed Strader to call back to set up a hospital follow-up appt with Dr. Craige Cotta for October.  Made him aware if I am not available and speaks to someone from [place], will need to message me to schedule.  Will send a message in teams to make them aware in case he calls while I am at lunch.  Patient was consulted in the hospital by Dr. Craige Cotta.  Referral in workqueue from Dr. Sherryll Burger.  Note under communications as well.  ta

## 2021-05-09 ENCOUNTER — Encounter: Payer: Medicaid Other | Admitting: Orthopedic Surgery

## 2021-05-09 NOTE — Telephone Encounter (Signed)
05/09/21 - Mr. [name] called back stating he s/w someone with Transportation at [employer] (possibly [place]) regarding appt and they are aware and should call to set that up.  Will await call from [employer] transportation for appt.  If no response in a week or so, will call to set up hospital follow-up appt with Dr. Craige Cotta in October.  ta

## 2021-05-10 NOTE — Telephone Encounter (Signed)
Called and s/w [name] with transportation at [employer].  Patient is scheduled 07/19/21 at 11:30 with Dr. Craige Cotta.  Scheduled as hospital follow-up.  ta

## 2021-05-11 ENCOUNTER — Encounter: Payer: Medicaid Other | Admitting: Orthopedic Surgery

## 2021-05-18 ENCOUNTER — Encounter: Payer: Medicaid Other | Admitting: Orthopedic Surgery

## 2021-05-23 ENCOUNTER — Encounter: Payer: Medicaid Other | Admitting: Orthopedic Surgery

## 2021-05-24 ENCOUNTER — Telehealth: Payer: Self-pay | Admitting: Orthopedic Surgery

## 2021-05-24 NOTE — Telephone Encounter (Signed)
Patient's friend, Dierdre Highman called with some questions this morning regarding [name].  I told him that I did have him listed as contact for the patient but I did not have a signed release to answer any questions regarding [name].  He said that pt's husband has POA.  He will come by today with this for our records

## 2021-05-25 ENCOUNTER — Encounter: Payer: Medicaid Other | Admitting: Orthopedic Surgery

## 2021-06-01 ENCOUNTER — Encounter: Payer: Medicaid Other | Admitting: Orthopedic Surgery

## 2021-06-08 ENCOUNTER — Encounter: Payer: Self-pay | Admitting: Orthopedic Surgery

## 2021-06-08 ENCOUNTER — Other Ambulatory Visit: Payer: Self-pay

## 2021-06-08 ENCOUNTER — Ambulatory Visit (INDEPENDENT_AMBULATORY_CARE_PROVIDER_SITE_OTHER): Payer: Medicaid Other | Admitting: Orthopedic Surgery

## 2021-06-08 VITALS — Ht 68.0 in

## 2021-06-08 DIAGNOSIS — S4291XD Fracture of right shoulder girdle, part unspecified, subsequent encounter for fracture with routine healing: Secondary | ICD-10-CM

## 2021-06-08 NOTE — Patient Instructions (Signed)
To facility   Please check patients sacrum ( I dont handle that)  If needed start sacral decubitus care and get orders from covering physician

## 2021-06-08 NOTE — Progress Notes (Signed)
Chief Complaint  Patient presents with   Shoulder Pain    Right shoulder injury July 29th    55 year old female comes in for follow-up regarding right shoulder dislocation which required 2 closed reductions  The patient shoulder has improved significantly.  She can now move the arm on her own she has no restrictions in her passive range of motion is having no pain there is no deformity neurovascular exam is intact  Patient presents from [facility] in [place]  And there is confusion with the patient and her family about where she is going after she leaves the office.  She thinks she is going home the family is unaware  The patient is essentially nonambulatory and other than a ramp does not have the equipment necessary for her to return to home

## 2021-07-19 ENCOUNTER — Ambulatory Visit (INDEPENDENT_AMBULATORY_CARE_PROVIDER_SITE_OTHER): Payer: Medicaid Other | Admitting: Pulmonary Disease

## 2021-07-19 ENCOUNTER — Encounter: Payer: Self-pay | Admitting: Pulmonary Disease

## 2021-07-19 ENCOUNTER — Other Ambulatory Visit: Payer: Self-pay

## 2021-07-19 VITALS — BP 132/80 | HR 96 | Temp 98.0°F

## 2021-07-19 DIAGNOSIS — J9612 Chronic respiratory failure with hypercapnia: Secondary | ICD-10-CM

## 2021-07-19 DIAGNOSIS — Z23 Encounter for immunization: Secondary | ICD-10-CM | POA: Diagnosis not present

## 2021-07-19 DIAGNOSIS — J8410 Pulmonary fibrosis, unspecified: Secondary | ICD-10-CM

## 2021-07-19 DIAGNOSIS — J449 Chronic obstructive pulmonary disease, unspecified: Secondary | ICD-10-CM | POA: Diagnosis not present

## 2021-07-19 DIAGNOSIS — J9611 Chronic respiratory failure with hypoxia: Secondary | ICD-10-CM | POA: Diagnosis not present

## 2021-07-19 NOTE — Progress Notes (Signed)
[facility]  Chief Complaint  Patient presents with   Hospitalization Follow-up    COPD from hospital follow up from CXR.  Abnormalities from CXR.      Past Surgical History:  She  has a past surgical history that includes Abdominal hysterectomy; Oophorectomy; Shoulder Closed Reduction (Right, 04/22/2021); and Shoulder Closed Reduction (Right, 04/25/2021).  Past Medical History:  Anxiety, DM type 2, HTN, Bipolar, HLD, Vit D deficiency, Neuropathy  Constitutional:  BP 132/80   Pulse 96   Temp 98 F (36.7 C)   SpO2 96%   Brief Summary:  [name] is a 55 y.o. female with chronic hypoxic/hypercapnic respiratory failure in setting of sleep disordered breathing with obesity hypoventilation syndrome and possible obstructive lung disease.      Subjective:   She is here with her husband and advocate, Ed Iran Ouch.  She is still at the rehab facility.  She is frustrated by her lack of progress in walking and being able to go home.  She uses 2 liters oxygen at night.  She hasn't been using PAP therapy.  She uses duoneb intermittently during the day.    She is not having cough, wheeze, or chest congestion.  Swelling improved compared to when she was in hospital.  Physical Exam:   Appearance - well kempt, sitting in wheelchair  ENMT - no sinus tenderness, no oral exudate, no LAN, Mallampati 4 airway, no stridor  Respiratory - equal breath sounds bilaterally, no wheezing or rales  CV - s1s2 regular rate and rhythm, no murmurs  Ext - no clubbing, no edema  Skin - no rashes  Psych - normal mood and affect   Pulmonary testing:  ABG on 50% FiO2 04/23/21 >> pH 7.28, PCO2 72.3, PO2 85  Chest Imaging:  CT chest 04/22/21 >> atelectasis, few scattered calcified micronodules, enlarged PA CT angio chest 04/27/21 >> atelectasis  Sleep Tests:    Cardiac Tests:  Doppler legs b/l 04/23/21 >> no DVT Echo 04/23/21 >> EF 70 to 75%, mild  LVH, mild RV enlargement  Social History:  She  reports that she has never smoked. She has never used smokeless tobacco. She reports that she does not drink alcohol and does not use drugs.  Family History:  Her family history is not on file.     Assessment/Plan:   Chronic hypoxic, hypercapnic respiratory failure. - likely from sleep disordered breathing and possible obstructive lung disease - continue 2 liters oxygen at night and prn with exertion during the day - will arrange for overnight oximetry with 2 liters oxygen - she will eventually need additional sleep testing in the sleep lab, but this is not feasible at this time given her overall conditioning and still needing significant assistance with mobility - continue pulmicort and prn duoneb since she reports symptomatic benefit - will eventually need to get set up for PFT, but don't think she can adequately perform test maneuvers at this time - influenza vaccination today  Abnormal CT chest. - her CT imaging from hospitalization showed calcified granulomas - explained this is a benign finding and no additional follow up needed for this  Time Spent Involved in Patient Care on Day of Examination:  33 minutes  Follow up:   Patient Instructions  Will arrange for overnight oxygen test  Flu shot today  Follow up in 4 months  Medication List:   Allergies as of 07/19/2021       Reactions   Bee Venom  Hives        Medication List        Accurate as of July 19, 2021 12:13 PM. If you have any questions, ask your nurse or doctor.          STOP taking these medications    DULoxetine 60 MG capsule Commonly known as: CYMBALTA Stopped by: Coralyn Helling, MD   hydrOXYzine 25 MG tablet Commonly known as: ATARAX/VISTARIL Stopped by: Coralyn Helling, MD   isosorbide mononitrate 30 MG 24 hr tablet Commonly known as: IMDUR Stopped by: Coralyn Helling, MD   LORazepam 0.5 MG tablet Commonly known as: ATIVAN Stopped by:  Coralyn Helling, MD   mirabegron ER 25 MG Tb24 tablet Commonly known as: MYRBETRIQ Stopped by: Coralyn Helling, MD   multivitamins ther. w/minerals Tabs tablet Stopped by: Coralyn Helling, MD   Slow-Mag 64 MG Tbec SR tablet Generic drug: magnesium chloride Stopped by: Coralyn Helling, MD       TAKE these medications    ARIPiprazole 30 MG tablet Commonly known as: ABILIFY Take 30 mg by mouth every morning.   aspirin EC 81 MG tablet Take 81 mg by mouth every morning.   Biotin 10 MG Caps Take 1 capsule by mouth daily.   budesonide 0.25 MG/2ML nebulizer solution Commonly known as: PULMICORT Take 2 mLs (0.25 mg total) by nebulization 2 (two) times daily.   docusate sodium 100 MG capsule Commonly known as: COLACE Take 100 mg by mouth 3 (three) times daily.   insulin aspart 100 UNIT/ML injection Commonly known as: novoLOG CBG < 70: implement hypoglycemia protocol CBG 70 - 120: 0 units CBG 121 - 150: 3 units CBG 151 - 200: 4 units CBG 201 - 250: 7 units CBG 251 - 300: 11 units CBG 301 - 350: 15 units CBG 351 - 400: 20 units   insulin glargine 100 UNIT/ML injection Commonly known as: LANTUS Inject 0.45 mLs (45 Units total) into the skin 2 (two) times daily.   ipratropium-albuterol 0.5-2.5 (3) MG/3ML Soln Commonly known as: DUONEB Take 3 mLs by nebulization every 4 (four) hours as needed.   nystatin powder Commonly known as: MYCOSTATIN/NYSTOP Apply topically 2 (two) times daily.   pravastatin 40 MG tablet Commonly known as: PRAVACHOL Take 40 mg by mouth daily.   propranolol 60 MG tablet Commonly known as: INDERAL Take 60 mg by mouth daily. On an empty stomach   Victoza 18 MG/3ML Sopn Generic drug: liraglutide Inject 1.8 mg into the skin daily.   Vitamin D3 50 MCG (2000 UT) Tabs Take 1 tablet by mouth daily.        Signature:  Coralyn Helling, MD [facility] Pulmonary/Critical Care Pager - [phone] 07/19/2021, 12:13 PM

## 2021-07-19 NOTE — Patient Instructions (Signed)
Will arrange for overnight oxygen test  Flu shot today  Follow up in 4 months

## 2021-08-09 ENCOUNTER — Other Ambulatory Visit: Payer: Self-pay

## 2021-08-09 ENCOUNTER — Encounter: Payer: Self-pay | Admitting: Nurse Practitioner

## 2021-08-09 ENCOUNTER — Non-Acute Institutional Stay: Payer: Medicaid Other | Admitting: Nurse Practitioner

## 2021-08-09 VITALS — BP 131/72 | HR 78 | Temp 98.0°F | Resp 18 | Wt 285.0 lb

## 2021-08-09 DIAGNOSIS — R0602 Shortness of breath: Secondary | ICD-10-CM

## 2021-08-09 DIAGNOSIS — R5381 Other malaise: Secondary | ICD-10-CM

## 2021-08-09 DIAGNOSIS — Z515 Encounter for palliative care: Secondary | ICD-10-CM

## 2021-08-09 DIAGNOSIS — J449 Chronic obstructive pulmonary disease, unspecified: Secondary | ICD-10-CM

## 2021-08-09 NOTE — Progress Notes (Signed)
Therapist, nutritional Palliative Care Consult Note Telephone: [phone]  Fax: [phone]   Date of encounter: 08/09/21 5:18 PM PATIENT NAME: [name] [address] [place] [address]   [phone] (home)  DOB: 12-Jan-1966 MRN: [id number] PRIMARY CARE PROVIDER:   Dr Frances Mahon [facility], [place], [place]  RESPONSIBLE PARTY:    Contact Information     Name Relation Home Work Mobile   [place] Spouse [phone]     Rudy Jew [phone]  [phone]   [place]illas,christopher Son   [phone]      I met face to face with patient in facility. Palliative Care was asked to follow this patient by consultation request of  Dr Roseanne Reno o address advance care planning and complex medical decision making. This is the initial visit.                       ASSESSMENT AND PLAN / RECOMMENDATIONS:  Advance Care Planning/Goals of Care: Goals include to maximize quality of life and symptom management. Patient/health care surrogate gave his/her permission to discuss.Our advance care planning conversation included a discussion about:    The value and importance of advance care planning  Experiences with loved ones who have been seriously ill or have died  Exploration of personal, cultural or spiritual beliefs that might influence medical decisions  Exploration of goals of care in the event of a sudden injury or illness  Identification and preparation of a healthcare agent  Review and updating or creation of an  advance directive document . Decision not to resuscitate or to de-escalate disease focused treatments due to poor prognosis. CODE STATUS: Full code  Symptom Management/Plan: 1. Advance Care Planning; Full code, full scope of treatment  2. Debility secondary to COPD, O2 dependence, chronic respiratory failure, bipolar Continue PT/OT at facility and when d/c home. Encourage mobility, self care as able. Will continue to  follow with d/c from str with home Palliative care.  3. Shortness of breath secondary to COPD Continue inhalation therapy, monitor respiratory status, O2  4. Goals of Care: Goals include to maximize quality of life and symptom management. Our advance care planning conversation included a discussion about:    The value and importance of advance care planning  Exploration of personal, cultural or spiritual beliefs that might influence medical decisions  Exploration of goals of care in the event of a sudden injury or illness  Identification and preparation of a healthcare agent  Review and updating or creation of an advance directive document.  5. Palliative care encounter; Palliative care encounter; Palliative medicine team will continue to support patient, patient's family, and medical team. Visit consisted of counseling and education dealing with the complex and emotionally intense issues of symptom management and palliative care in the setting of serious and potentially life-threatening illness  6. f/u 2 weeks for ongoing monitoring chronic disease progression, ongoing discussions complex medical decision making  Follow up Palliative Care Visit: Palliative care will continue to follow for complex medical decision making, advance care planning, and clarification of goals. Return 2 weeks or prn.  I spent 80 minutes providing this consultation. More than 50% of the time in this consultation was spent in counseling and care coordination. PPS: 40%  Chief Complaint: Initial Palliative consult for complex medical decision making  HISTORY OF PRESENT ILLNESS:  [name] is a 55 y.o. year old female  with multiple medical problems including COPD, HTN, Chronic respiratory failure, O2 dependent  on 3L/[place], CKD, DM, HTN, bipolar. Mechanical fall on the a.m. of 04/22/2021. She was found to have anterior right shoulder dislocation, Hospitalized 04/22/2021 to 05/04/2021.  Reduced by orthopedics on 7/29 and  then again on 8/1.  Develop  acute on chronic hypoxemic and hypercarbic respiratory failure secondary to obstructive sleep apnea and requires 3 L nasal cannula oxygen.  Her echocardiogram was unremarkable with LVEF of 70% and CT angiogram was negative for PE. She was stabilized d/c to STR at [facility] where she currently resides. [name] requires assistance for transfers, mobility, adl's including bathing, dressing, toileting, feedings as she does continue to have a tremor. [name] has been participating in therapy. [name] is a full code. I visited and observed [name] lying in bed in her room at [facility]. [name] was a previous patient of mine from prior practice and well known to this provider. We talked about purpose of pc visit. We talked about how she has been feeling. [name] endorses her wishes are to go home with services to assist her. We talked about CAPS program, PCS services as she lives with her husband who cares for her. We talked about ros, past medical history, current functional and cognitive ability. We talked about appetite, weight. We talked about medical goals including code status, wishes full code. Life review. We talked about role pc in poc. Discussed will f/u in 1 week and continue to follow home pc when d/c. [name] in agreement. Updated staff. Therapeutic listening, emotional support provided. Questions answered. I did attempt to contact husband, unable to leave a message.   History obtained from review of EMR, discussion with facility staff and Ms. Quilter.  I reviewed available labs, medications, imaging, studies and related documents from the EMR.  Records reviewed and summarized above.   ROS Full 10 system review of systems performed and negative with exception of: as per HPI.   Physical Exam: Constitutional: NAD General: obese, debilitated, chronically ill, pleasant female EYES: lids intact ENMT: oral mucous membranes moist CV:  S1S2, RRR, +BLE edema Pulmonary: clear; decrease bases, no increased work of breathing, + cough Abdomen: normo-active BS + 4 quadrants, soft and non tender MSK:w/c dependent Skin: warm and dry Neuro:  +generalized weakness,  +mild  cognitive impairment Psych: non-anxious affect, A and O x 3  CURRENT PROBLEM LIST:  Patient Active Problem List   Diagnosis Date Noted   Closed fracture dislocation of joint of right shoulder girdle    AKI (acute kidney injury) ([place])    Traumatic closed displaced fracture of right shoulder with anterior dislocation 04/25/2021   Acute on chronic respiratory failure with hypoxia and hypercapnia (HCC) 04/23/2021   Acute on chronic respiratory failure with hypoxia ([place]) 04/22/2021   Acute renal failure superimposed on stage 3a chronic kidney disease ([place]) 04/22/2021   Fall    Anterior shoulder dislocation, right, initial encounter    Chronic kidney disease, stage 2 (mild) 04/22/2018   Essential hypertension 04/22/2018   Hypercalcemia 04/22/2018   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   Acute respiratory failure with hypoxia ([place]) 04/15/2014   Septic shock(785.52) 04/15/2014   Sepsis ([place]) 04/15/2014   ARF (acute renal failure) ([place]) 04/12/2014   SBO (small bowel obstruction) ([place]) 04/11/2014   Bipolar disorder ([place]) 04/11/2014   DM type 2 (diabetes mellitus, type 2) ([place]) 04/11/2014   Seizure disorder ([place]) 04/11/2014   DEGENERATIVE JOINT DISEASE, RIGHT KNEE 07/13/2008   KNEE PAIN 07/13/2008  PAST MEDICAL HISTORY:  Active Ambulatory Problems    Diagnosis Date Noted   DEGENERATIVE JOINT DISEASE, RIGHT KNEE 07/13/2008   KNEE PAIN 07/13/2008   SBO (small bowel obstruction) ([place]) 04/11/2014   Bipolar disorder ([place]) 04/11/2014   DM type 2 (diabetes mellitus, type 2) ([place]) 04/11/2014   Seizure disorder ([place]) 04/11/2014   ARF (acute renal failure) ([place]) 04/12/2014   Acute respiratory failure with hypoxia ([place])  04/15/2014   Septic shock(785.52) 04/15/2014   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   Acute on chronic respiratory failure with hypoxia ([place]) 04/22/2021   Acute renal failure superimposed on stage 3a chronic kidney disease ([place]) 04/22/2021   Fall    Anterior shoulder dislocation, right, initial encounter    Acute on chronic respiratory failure with hypoxia and hypercapnia ([place]) 04/23/2021   Traumatic closed displaced fracture of right shoulder with anterior dislocation 04/25/2021   Closed fracture dislocation of joint of right shoulder girdle    AKI (acute kidney injury) ([place])    Chronic kidney disease, stage 2 (mild) 04/22/2018   Essential hypertension 04/22/2018   Hypercalcemia 04/22/2018   Sepsis ([place]) 04/15/2014   Resolved Ambulatory Problems    Diagnosis Date Noted   No Resolved Ambulatory Problems   Past Medical History:  Diagnosis Date   Anxiety    COPD (chronic obstructive pulmonary disease) (HCC)    Diabetes mellitus    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 No pertinent family history.  reviewed  ALLERGIES:  Allergies  Allergen Reactions   Bee Venom Hives     PERTINENT MEDICATIONS:  Outpatient Encounter Medications as of 08/09/2021  Medication Sig   ARIPiprazole (ABILIFY) 30 MG tablet Take 30 mg by mouth every morning.     aspirin EC 81 MG tablet Take 81 mg by mouth every morning.     Biotin 10 MG CAPS Take 1 capsule by mouth daily.   budesonide (PULMICORT) 0.25 MG/2ML nebulizer solution Take 2 mLs (0.25 mg total) by nebulization 2 (two) times daily.   Cholecalciferol (VITAMIN D3) 50 MCG (2000 UT) TABS Take 1 tablet by mouth daily.   docusate sodium (COLACE) 100 MG capsule Take 100 mg  by mouth 3 (three) times daily.     insulin aspart (NOVOLOG) 100 UNIT/ML injection CBG < 70: implement hypoglycemia protocol CBG 70 - 120: 0 units CBG 121 - 150: 3 units CBG 151 - 200: 4 units CBG 201 - 250: 7 units CBG 251 - 300: 11 units CBG 301 - 350: 15 units CBG 351 - 400: 20 units   insulin glargine (LANTUS) 100 UNIT/ML injection Inject 0.45 mLs (45 Units total) into the skin 2 (two) times daily.   ipratropium-albuterol (DUONEB) 0.5-2.5 (3) MG/3ML SOLN Take 3 mLs by nebulization every 4 (four) hours as needed.   liraglutide (VICTOZA) 18 MG/3ML SOPN Inject 1.8 mg into the skin daily.   nystatin (MYCOSTATIN/NYSTOP) powder Apply topically 2 (two) times daily.   pravastatin (PRAVACHOL) 40 MG tablet Take 40 mg by mouth daily.   propranolol (INDERAL) 60 MG tablet Take 60 mg by mouth daily. On an empty stomach   No facility-administered encounter medications on file  as of 08/09/2021.  Questions and concerns were addressed. The patient/family was encouraged to call with questions and/or concerns. Provided general support and encouragement, no other unmet needs identified  Thank you for the opportunity to participate in the care of Ms. Tarman.  The palliative care team will continue to follow. Please call our office at [phone] if we can be of additional assistance.   This chart was dictated using voice recognition software.  Despite best efforts to proofread,  errors can occur which can change the documentation meaning.   Geraldy Akridge Z Kayla Deshaies, NP ,

## 2021-08-15 ENCOUNTER — Non-Acute Institutional Stay: Payer: Medicaid Other | Admitting: Nurse Practitioner

## 2021-08-15 ENCOUNTER — Encounter: Payer: Self-pay | Admitting: Nurse Practitioner

## 2021-08-15 ENCOUNTER — Other Ambulatory Visit: Payer: Self-pay

## 2021-08-15 VITALS — BP 128/60 | HR 82 | Temp 97.1°F | Resp 18 | Wt 285.0 lb

## 2021-08-15 DIAGNOSIS — R0602 Shortness of breath: Secondary | ICD-10-CM

## 2021-08-15 DIAGNOSIS — J449 Chronic obstructive pulmonary disease, unspecified: Secondary | ICD-10-CM

## 2021-08-15 DIAGNOSIS — Z515 Encounter for palliative care: Secondary | ICD-10-CM

## 2021-08-15 DIAGNOSIS — R5381 Other malaise: Secondary | ICD-10-CM

## 2021-08-15 NOTE — Progress Notes (Signed)
[facility] Palliative Care Consult Note Telephone: [phone]  Fax: [phone]    Date of encounter: 08/15/21 6:28 PM PATIENT NAME: [name] [address] [place] [address]   [phone] (home)  DOB: [birth year] MRN: [id number] PRIMARY CARE PROVIDER:   Dr [facility] [place] RESPONSIBLE PARTY:    Contact Information     Name Relation Home Work Mobile   [name] Spouse [phone]     [name] [phone]  [phone]   [name] Son   [phone]      I met face to face with patient in facility. Palliative Care was asked to follow this patient by consultation request of  Dr [facility] to address advance care planning and complex medical decision making. This is a follow up visit ASSESSMENT AND PLAN / RECOMMENDATIONS:  Symptom Management/Plan: 1. Advance Care Planning; Full code, full scope of treatment   2. Debility secondary to COPD, O2 dependence, chronic respiratory failure, bipolar Continue PT/OT at facility when completed, [name] wishes are to d/c home although with increase in skill level, difficulty for family to care for [name] at home, recommendation is to transition to LTC. Encourage mobility, self care as able. Will continue Palliative care.   3. Shortness of breath secondary to COPD Continue inhalation therapy, monitor respiratory status, O2   4. Palliative care encounter; Palliative care encounter; Palliative medicine team will continue to support patient, patient's family, and medical team. Visit consisted of counseling and education dealing with the complex and emotionally intense issues of symptom management and palliative care in the setting of serious and potentially life-threatening illness   6. f/u 2 weeks for ongoing monitoring chronic disease progression, ongoing discussions complex medical decision making  Follow up  Palliative Care Visit: Palliative care will continue to follow for complex medical decision making, advance care planning, and clarification of goals. Return 2 weeks or prn.  I spent 37 minutes providing this consultation starting at 1:30pm More than 50% of the time in this consultation was spent in counseling and care coordination. PPS: 30%  Chief Complaint: Follow up palliative consult for complex medical decision making  HISTORY OF PRESENT ILLNESS:  [name] is a 55 y.o. year old female  with multiple medical problems including COPD, HTN, Chronic respiratory failure, O2 dependent on 3L/, CKD, DM, HTN, bipolar. Mechanical fall on the a.m. of 04/22/2021. She was found to have anterior right shoulder dislocation, Hospitalized 04/22/2021 to 05/04/2021.  Reduced by orthopedics on 7/29 and then again on 8/1.  Develop  acute on chronic hypoxemic and hypercarbic respiratory failure secondary to obstructive sleep apnea and requires 3 L nasal cannula oxygen.  Her echocardiogram was unremarkable with LVEF of 70% and CT angiogram was negative for PE. She was stabilized d/c to STR at [facility] where she currently resides. [name] requires assistance for transfers, mobility, adl's including bathing, dressing, toileting, feedings as she does continue to have a tremor. [name] has been participating in therapy. [name] is a full code. I visited and observed [name] lying in bed in her room at [facility]. [name] and I talked about how she has been feeling today. [name] endorses she is hoping to go home. We talked about realistic expectation. We talked about symptoms, appetite, debility at length. We talked about concern for going home and inability to care for herself. We talked about medical goals. We talked about quality of life. We talked about  role PC, Therapeutic listening, emotional support provided. Questions answered. Updated staff  History obtained from review of EMR,  discussion with facility staff and [name].  I reviewed available labs, medications, imaging, studies and related documents from the EMR.  Records reviewed and summarized above.   ROS Full 10 system review of systems performed and negative with exception of: as per HPI.   Physical Exam: Constitutional: NAD General: obese chronically ill female EYES: lids intact ENMT: oral mucous membranes moist CV: S1S2, RRR, +BLE edema Pulmonary: decreased bases, no increased work of breathing, no cough, O2 Abdomen: normo-active BS + 4 quadrants, soft and non tender MSK: bed-bound Skin: warm and dry Neuro:  + generalized weakness,  + cognitive impairment Psych: non-anxious affect, A and O x 3  Questions and concerns were addressed. Provided general support and encouragement, no other unmet needs identified   Thank you for the opportunity to participate in the care of [name].  The palliative care team will continue to follow. Please call our office at [phone] if we can be of additional assistance.   This chart was dictated using voice recognition software.  Despite best efforts to proofread,  errors can occur which can change the documentation meaning.   Artyom Stencel Ihor Gully, NP

## 2021-08-19 ENCOUNTER — Emergency Department
Admission: EM | Admit: 2021-08-19 | Discharge: 2021-08-19 | Disposition: A | Discharge status: Home / Self Care | Payer: Medicaid Other | Attending: Emergency Medicine | Admitting: Emergency Medicine

## 2021-08-19 ENCOUNTER — Encounter: Payer: Self-pay | Admitting: Emergency Medicine

## 2021-08-19 ENCOUNTER — Other Ambulatory Visit: Payer: Self-pay

## 2021-08-19 ENCOUNTER — Emergency Department: Payer: Medicaid Other

## 2021-08-19 DIAGNOSIS — Z7982 Long term (current) use of aspirin: Secondary | ICD-10-CM | POA: Insufficient documentation

## 2021-08-19 DIAGNOSIS — J209 Acute bronchitis, unspecified: Secondary | ICD-10-CM | POA: Insufficient documentation

## 2021-08-19 DIAGNOSIS — Z7951 Long term (current) use of inhaled steroids: Secondary | ICD-10-CM | POA: Diagnosis not present

## 2021-08-19 DIAGNOSIS — Z7984 Long term (current) use of oral hypoglycemic drugs: Secondary | ICD-10-CM | POA: Insufficient documentation

## 2021-08-19 DIAGNOSIS — Z79899 Other long term (current) drug therapy: Secondary | ICD-10-CM | POA: Diagnosis not present

## 2021-08-19 DIAGNOSIS — Z794 Long term (current) use of insulin: Secondary | ICD-10-CM | POA: Insufficient documentation

## 2021-08-19 DIAGNOSIS — Z20822 Contact with and (suspected) exposure to covid-19: Secondary | ICD-10-CM | POA: Diagnosis not present

## 2021-08-19 DIAGNOSIS — N1831 Chronic kidney disease, stage 3a: Secondary | ICD-10-CM | POA: Insufficient documentation

## 2021-08-19 DIAGNOSIS — E1122 Type 2 diabetes mellitus with diabetic chronic kidney disease: Secondary | ICD-10-CM | POA: Insufficient documentation

## 2021-08-19 DIAGNOSIS — I129 Hypertensive chronic kidney disease with stage 1 through stage 4 chronic kidney disease, or unspecified chronic kidney disease: Secondary | ICD-10-CM | POA: Diagnosis not present

## 2021-08-19 DIAGNOSIS — J449 Chronic obstructive pulmonary disease, unspecified: Secondary | ICD-10-CM | POA: Diagnosis not present

## 2021-08-19 DIAGNOSIS — G20 Parkinson's disease: Secondary | ICD-10-CM | POA: Insufficient documentation

## 2021-08-19 DIAGNOSIS — R0600 Dyspnea, unspecified: Secondary | ICD-10-CM | POA: Diagnosis present

## 2021-08-19 LAB — RESP PANEL BY RT-PCR (FLU A&B, COVID) ARPGX2
Influenza A by PCR: NEGATIVE
Influenza B by PCR: NEGATIVE
SARS Coronavirus 2 by RT PCR: NEGATIVE

## 2021-08-19 LAB — BASIC METABOLIC PANEL
Anion gap: 7 (ref 5–15)
BUN: 18 mg/dL (ref 6–20)
CO2: 32 mmol/L (ref 22–32)
Calcium: 9.5 mg/dL (ref 8.9–10.3)
Chloride: 101 mmol/L (ref 98–111)
Creatinine, Ser: 1.19 mg/dL — ABNORMAL HIGH (ref 0.44–1.00)
GFR, Estimated: 54 mL/min — ABNORMAL LOW (ref >60)
Glucose, Bld: 125 mg/dL — ABNORMAL HIGH (ref 70–99)
Potassium: 3.9 mmol/L (ref 3.5–5.1)
Sodium: 140 mmol/L (ref 135–145)

## 2021-08-19 LAB — PROCALCITONIN: Procalcitonin: <0.1 ng/mL

## 2021-08-19 LAB — CBC
HCT: 43.9 % (ref 36.0–46.0)
Hemoglobin: 13.8 g/dL (ref 12.0–15.0)
MCH: 27.9 pg (ref 26.0–34.0)
MCHC: 31.4 g/dL (ref 30.0–36.0)
MCV: 88.9 fL (ref 80.0–100.0)
Platelets: 230 10*3/uL (ref 150–400)
RBC: 4.94 MIL/uL (ref 3.87–5.11)
RDW: 15.4 % (ref 11.5–15.5)
WBC: 5.8 10*3/uL (ref 4.0–10.5)
nRBC: 0 % (ref 0.0–0.2)

## 2021-08-19 LAB — TROPONIN I (HIGH SENSITIVITY)
Troponin I (High Sensitivity): 8 ng/L (ref <18)
Troponin I (High Sensitivity): 7 ng/L (ref <18)

## 2021-08-19 MED ORDER — AZITHROMYCIN 250 MG PO TABS: ORAL_TABLET | ORAL | 0 refills | Status: AC

## 2021-08-19 MED ORDER — DEXAMETHASONE SODIUM PHOSPHATE 10 MG/ML IJ SOLN
10.0000 mg | Freq: Once | INTRAMUSCULAR | Status: AC
  Administered 2021-08-19: 10 mg via INTRAMUSCULAR
  Filled 2021-08-19: qty 1

## 2021-08-19 NOTE — ED Notes (Signed)
RN spoke to staff RN at [facility], pt to come back there and social work there to speak to family about discharge.

## 2021-08-19 NOTE — ED Notes (Signed)
Pt requesting to go home with husband instead of going to [facility]. Pt asking Rn to help husband sign paperwork to be able to take her home. Pt informed that Rn would get in contact with facility.

## 2021-08-19 NOTE — ED Triage Notes (Signed)
Pt in via EMS from [facility] with cp non radiating that gets worse with cough. Pt with productive cough of green phlem. 90% RA placed on 2L and now 96%.

## 2021-08-19 NOTE — ED Provider Notes (Signed)
[facility] Emergency Department Provider Note  Time seen: 11:52 AM  I have reviewed the triage vital signs and the nursing notes.   HISTORY  Chief Complaint Chest Pain and Cough   HPI [name] is a 55 y.o. female with a past medical history of COPD, hypertension, Parkinson's, presents to the emergency department for cough and shortness of breath.  According to the patient she is currently at a rehab facility where she has been for the last 3 months per patient.  Patient states over the past week she has had a cough with occasional green sputum production and has been feeling somewhat short of breath.  Patient states they do not let her wear oxygen all the time when she is supposed to be wearing 2L 24 hours a day.  Patient denies any chest pain.  Denies any fever.  Patient states she is feeling much better in the emergency department after being placed on 2 L of oxygen and is asking if she could go home to her husband instead of going back to rehab.   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[place])    Hypertension    Panic attacks    Parkinson's disease    Seizures ([place])     Patient Active Problem List   Diagnosis Date Noted   Closed fracture dislocation of joint of right shoulder girdle    AKI (acute kidney injury) ([place])    Traumatic closed displaced fracture of right shoulder with anterior dislocation 04/25/2021   Acute on chronic respiratory failure with hypoxia and hypercapnia ([place]) 04/23/2021   Acute on chronic respiratory failure with hypoxia ([employer]) 04/22/2021   Acute renal failure superimposed on stage 3a chronic kidney disease ([place]) 04/22/2021   Fall    Anterior shoulder dislocation, right, initial encounter    Chronic kidney disease, stage 2 (mild) 04/22/2018   Essential hypertension 04/22/2018   Hypercalcemia 04/22/2018    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   Acute respiratory failure with hypoxia ([place]) 04/15/2014   Septic shock(785.52) 04/15/2014   Sepsis ([place]) 04/15/2014   ARF (acute renal failure) ([place]) 04/12/2014   SBO (small bowel obstruction) ([place]) 04/11/2014   Bipolar disorder ([place]) 04/11/2014   DM type 2 (diabetes mellitus, type 2) ([place]) 04/11/2014   Seizure disorder ([place]) 04/11/2014   DEGENERATIVE JOINT DISEASE, RIGHT KNEE 07/13/2008   KNEE PAIN 07/13/2008    Past Surgical History:  Procedure Laterality Date   ABDOMINAL HYSTERECTOMY     OOPHORECTOMY     SHOULDER CLOSED REDUCTION Right 04/22/2021   Procedure: CLOSED REDUCTION SHOULDER;  Surgeon: Carole Civil, MD;  Location: [facility];  Service: Orthopedics;  Laterality: Right;   SHOULDER CLOSED REDUCTION Right 04/25/2021   Procedure: CLOSED REDUCTION SHOULDER;  Surgeon: Carole Civil, MD;  Location: [facility];  Service: Orthopedics;  Laterality: Right;    Prior to Admission medications   Medication Sig Start Date End Date Taking? Authorizing Provider  ARIPiprazole (ABILIFY) 30 MG tablet Take 30 mg by mouth every morning.      [provider]  aspirin EC 81 MG tablet Take 81 mg by mouth every morning.      [provider]  Biotin 10 MG CAPS Take 1 capsule by mouth daily.    [provider]  budesonide (  PULMICORT) 0.25 MG/2ML nebulizer solution Take 2 mLs (0.25 mg total) by nebulization 2 (two) times daily. 04/29/21   Erick Blinks, MD  Cholecalciferol (VITAMIN D3) 50 MCG (2000 UT) TABS Take 1 tablet by mouth daily.    [provider]  docusate sodium (COLACE) 100 MG capsule Take 100 mg by mouth 3 (three) times daily.      [provider]  insulin aspart (NOVOLOG) 100 UNIT/ML injection CBG < 70: implement hypoglycemia protocol CBG 70 - 120: 0 units CBG 121 - 150: 3 units CBG 151 - 200: 4 units CBG 201 - 250: 7 units CBG 251 - 300: 11 units CBG 301 -  350: 15 units CBG 351 - 400: 20 units 04/20/14   Kathlen Mody, MD  insulin glargine (LANTUS) 100 UNIT/ML injection Inject 0.45 mLs (45 Units total) into the skin 2 (two) times daily. 04/29/21   Erick Blinks, MD  ipratropium-albuterol (DUONEB) 0.5-2.5 (3) MG/3ML SOLN Take 3 mLs by nebulization every 4 (four) hours as needed. 04/29/21   Erick Blinks, MD  liraglutide (VICTOZA) 18 MG/3ML SOPN Inject 1.8 mg into the skin daily.    [provider]  nystatin (MYCOSTATIN/NYSTOP) powder Apply topically 2 (two) times daily. 04/29/21   Erick Blinks, MD  pravastatin (PRAVACHOL) 40 MG tablet Take 40 mg by mouth daily.    [provider]  propranolol (INDERAL) 60 MG tablet Take 60 mg by mouth daily. On an empty stomach    [provider]    Allergies  Allergen Reactions   Bee Venom Hives    No family history on file.  Social History Social History   Tobacco Use   Smoking status: Never   Smokeless tobacco: Never  Substance Use Topics   Alcohol use: No   Drug use: No    Review of Systems Constitutional: Negative for fever. Cardiovascular: Negative for chest pain. Respiratory: Positive shortness of breath.  Positive for cough with occasional sputum production Gastrointestinal: Negative for abdominal pain, vomiting Musculoskeletal: Negative for musculoskeletal complaints Neurological: Negative for headache All other ROS negative  ____________________________________________   PHYSICAL EXAM:  VITAL SIGNS: ED Triage Vitals  Enc Vitals Group     BP 08/19/21 0731 120/82     Pulse Rate 08/19/21 0731 98     Resp 08/19/21 0731 20     Temp --      Temp src --      SpO2 08/19/21 0731 92 %     Weight 08/19/21 0728 258 lb (117 kg)     Height 08/19/21 0728 5\' 6"  (1.676 m)     Head Circumference --      Peak Flow --      Pain Score 08/19/21 0727 5     Pain Loc --      Pain Edu? --      Excl. in GC? --     Constitutional: Alert and oriented. Well appearing  and in no distress.  Very talkative, speaking in full sentences with no distress or difficulty. Eyes: Normal exam ENT      Head: Normocephalic and atraumatic.      Mouth/Throat: Mucous membranes are moist. Cardiovascular: Normal rate, regular rhythm.  Respiratory: Normal respiratory effort without tachypnea nor retractions. Breath sounds are clear no obvious wheeze rales or rhonchi. Gastrointestinal: Soft and nontender. No distention.   Musculoskeletal: Nontender with normal range of motion in all extremities. Neurologic:  Normal speech and language. No gross focal neurologic deficits Skin:  Skin is warm, dry  and intact.  Psychiatric: Mood and affect are normal.   ____________________________________________    EKG  EKG viewed and interpreted by myself shows a normal sinus rhythm at 100 bpm with a slightly widened QRS, left axis deviation, largely normal intervals, right bundle branch block, nonspecific but no concerning ST changes.  ____________________________________________    RADIOLOGY  Chest x-ray is clear  ____________________________________________   INITIAL IMPRESSION / ASSESSMENT AND PLAN / ED COURSE  Pertinent labs & imaging results that were available during my care of the patient were reviewed by me and considered in my medical decision making (see chart for details).   Patient presents to the emergency department for worsening cough congestion and occasional sputum production.  Patient states since arriving to the emergency department and being placed on oxygen she feels much better.  Currently satting 95% on 2 L of oxygen during my evaluation.  Patient states she is supposed be wearing 2 L of oxygen all the time.  States she has oxygen at home as well as her rehab center although they do not let her wear it all the time per patient.  Patient does have an occasional cough.  Clear lung sounds without wheeze rales or rhonchi on my exam.  Lab work is reassuring including  negative troponin.  Chest x-ray is clear.  EKG reassuring.  We will obtain a COVID/flu test as a precaution.  We will dose Decadron and plan to place on steroids/Zithromax for likely bronchitis.  Anticipate likely discharge home given her reassuring work-up thus far.  Patient's work-up is reassuring.  Repeat troponin negative.  COVID/flu negative.  Given the patient's reassuring work-up I believe she is safe for discharge home from the emergency department.  Patient is to continue to wear 2 L of oxygen 24/7.  We will discharge with Zithromax to cover for possible acute bronchitis.  Patient agreeable to plan.  [name] was evaluated in Emergency Department on 08/19/2021 for the symptoms described in the history of present illness. She was evaluated in the context of the global COVID-19 pandemic, which necessitated consideration that the patient might be at risk for infection with the SARS-CoV-2 virus that causes COVID-19. Institutional protocols and algorithms that pertain to the evaluation of patients at risk for COVID-19 are in a state of rapid change based on information released by regulatory bodies including the CDC and federal and state organizations. These policies and algorithms were followed during the patient's care in the ED.  ____________________________________________   FINAL CLINICAL IMPRESSION(S) / ED DIAGNOSES  Dyspnea Acute bronchitis   Harvest Dark, MD 08/19/21 1438

## 2021-08-19 NOTE — ED Triage Notes (Signed)
Pt reports is supposed to be on oxygen all the time but the facility does not give it to her. Pt reports is supposed to be on 2L. Pt 87% RA, placed on 2L and now 92%

## 2021-08-19 NOTE — ED Triage Notes (Signed)
Pt reports has been sick with cough and congestion for several days. Pt reports now she is coughing up green phlem and having chest pain with it. Pt concerned she has pneumonia

## 2021-08-19 NOTE — ED Notes (Signed)
Attempted to speak to staff at [facility] health care and keep getting placed on hold. Pt states she is not going back there

## 2021-08-19 NOTE — Evaluation (Signed)
Physical Therapy Evaluation Patient Details Name: [name] MRN: [id number] DOB: [birth year] Today's Date: 08/19/2021  History of Present Illness  Pt is a 55 y.o. female with a past medical history of COPD, anxiety, DM, seizures, hypertension, Parkinson's, presents to the emergency department for cough and shortness of breath.  MD assessment includes: possible acute bronchitis.   Clinical Impression  Pt anxious but with encouragement willing to participate with PT services.  Pt presented with significant functional weakness and even with +2 max A was unable to clear the surface of her recliner during sit to/from stand transfer attempts.  Of note, pt also required significant encouragement and cuing just to scoot forward in her recliner to the point where her feet were able to touch the floor.  Pt very motivated to return home rather than back to her SNF but at current functional level would be essentially dependent level of care and would require a hospital bed and a hoyer lift for safety with mobility and to assist with ADLs. Pt will benefit from PT services in a SNF setting upon discharge to safely address deficits listed in patient problem list for decreased caregiver assistance and eventual return to PLOF.        Recommendations for follow up therapy are one component of a multi-disciplinary discharge planning process, led by the attending physician.  Recommendations may be updated based on patient status, additional functional criteria and insurance authorization.  Follow Up Recommendations Acute inpatient rehab (3hours/day)    Assistance Recommended at Discharge Frequent or constant Supervision/Assistance  Functional Status Assessment Patient has had a recent decline in their functional status and/or demonstrates limited ability to make significant improvements in function in a reasonable and predictable amount of time  Equipment Recommendations  Other (comment);Hospital bed (At  current functional level patient would require a hoyer lift for mobiltiy at home)    Recommendations for Other Services       Precautions / Restrictions Precautions Precautions: Fall Restrictions Weight Bearing Restrictions: No      Mobility  Bed Mobility               General bed mobility comments: NT, pt in recliner in ER hall    Transfers                   General transfer comment: +2 max assist provided along with cues for hand placement and increased trunk flexion with pt unable to clear the surface of the recliner    Ambulation/Gait               General Gait Details: Unable  Stairs            Wheelchair Mobility    Modified Rankin (Stroke Patients Only)       Balance                                             Pertinent Vitals/Pain Pain Assessment: No/denies pain    Home Living Family/patient expects to be discharged to:: Private residence Living Arrangements: Spouse/significant other Available Help at Discharge: Family Type of Home: House Home Access: Ramped entrance       Home Layout: One level Home Equipment: Wheelchair - Physiological scientist (4 wheels)      Prior Function Prior Level of Function : Needs assist  Mobility Comments: Per patient, has not been in standing position for 3 months while at [facility] where they have been using a Hoyer lift for mobility ADLs Comments: Pt requires assist with all ADLs     Hand Dominance        Extremity/Trunk Assessment   Upper Extremity Assessment Upper Extremity Assessment: Generalized weakness    Lower Extremity Assessment Lower Extremity Assessment: Generalized weakness       Communication   Communication: No difficulties  Cognition Arousal/Alertness: Awake/alert Behavior During Therapy: WFL for tasks assessed/performed Overall Cognitive Status: Within Functional Limits for tasks assessed                                           General Comments      Exercises     Assessment/Plan    PT Assessment Patient needs continued PT services  PT Problem List Decreased strength;Decreased activity tolerance;Decreased mobility       PT Treatment Interventions Gait training;DME instruction;Functional mobility training;Therapeutic activities;Therapeutic exercise;Balance training;Patient/family education    PT Goals (Current goals can be found in the Care Plan section)  Acute Rehab PT Goals Patient Stated Goal: To get stronger and return home PT Goal Formulation: With patient Time For Goal Achievement: 09/01/21 Potential to Achieve Goals: Fair    Frequency Min 2X/week   Barriers to discharge Inaccessible home environment      Co-evaluation               AM-PAC PT "6 Clicks" Mobility  Outcome Measure Help needed turning from your back to your side while in a flat bed without using bedrails?: Total Help needed moving from lying on your back to sitting on the side of a flat bed without using bedrails?: Total Help needed moving to and from a bed to a chair (including a wheelchair)?: Total Help needed standing up from a chair using your arms (e.g., wheelchair or bedside chair)?: Total Help needed to walk in hospital room?: Total Help needed climbing 3-5 steps with a railing? : Total 6 Click Score: 6    End of Session Equipment Utilized During Treatment: Gait belt Activity Tolerance: Patient tolerated treatment well Patient left: in chair;Other (comment) (Pt left in ED hall in recliner as found) Nurse Communication: Mobility status PT Visit Diagnosis: Muscle weakness (generalized) (M62.81);Difficulty in walking, not elsewhere classified (R26.2)    Time: 3662-9476 PT Time Calculation (min) (ACUTE ONLY): 29 min   Charges:   PT Evaluation $PT Eval Moderate Complexity: 1 Mod         D. Scott Aslan Montagna PT, DPT 08/19/21, 4:50 PM

## 2021-08-22 ENCOUNTER — Other Ambulatory Visit: Payer: Self-pay

## 2021-08-22 ENCOUNTER — Encounter: Payer: Self-pay | Admitting: Nurse Practitioner

## 2021-08-22 ENCOUNTER — Non-Acute Institutional Stay: Payer: Medicaid Other | Admitting: Nurse Practitioner

## 2021-08-22 DIAGNOSIS — R5381 Other malaise: Secondary | ICD-10-CM

## 2021-08-22 DIAGNOSIS — R0602 Shortness of breath: Secondary | ICD-10-CM

## 2021-08-22 DIAGNOSIS — J449 Chronic obstructive pulmonary disease, unspecified: Secondary | ICD-10-CM

## 2021-08-22 DIAGNOSIS — Z515 Encounter for palliative care: Secondary | ICD-10-CM

## 2021-08-22 NOTE — Progress Notes (Signed)
Therapist, nutritional Palliative Care Consult Note Telephone: [phone]  Fax: [phone]    Date of encounter: 08/22/21 7:58 PM PATIENT NAME: [name] [address] [place] [address]   [phone] (home)  DOB: [birth year] MRN: [id number] PRIMARY CARE PROVIDER:   [facility] [address] [place] [address] [phone]  RESPONSIBLE PARTY:    Contact Information     Name Relation Home Work Mobile   [place] Spouse [phone]     Rudy Jew [phone]  [phone]   Michalec,christopher Son   [phone]      I met face to face with patient in facility. Palliative Care was asked to follow this patient by consultation request of [facility] to address advance care planning and complex medical decision making. This is a follow up visit.                                  ASSESSMENT AND PLAN / RECOMMENDATIONS:  Symptom Management/Plan: 1. Advance Care Planning; Full code, full scope of treatment   2. Debility secondary to COPD, O2 dependence, chronic respiratory failure, bipolar Continue PT/OT at facility when completed, Ms. Naeem wishes are to d/c home although with increase in skill level, difficulty for family to care for MS. Prouse at home, recommendation is to transition to LTC. Encourage mobility, self care as able. Will continue Palliative care.   3. Shortness of breath secondary to COPD Continue inhalation therapy, monitor respiratory status, O2   4. Palliative care encounter; Palliative care encounter; Palliative medicine team will continue to support patient, patient's family, and medical team. Visit consisted of counseling and education dealing with the complex and emotionally intense issues of symptom management and palliative care in the setting of serious and potentially life-threatening illness   6. f/u 2 weeks for ongoing monitoring chronic disease progression,  ongoing discussions complex medical decision making  Follow up Palliative Care Visit: Palliative care will continue to follow for complex medical decision making, advance care planning, and clarification of goals. Return 2 weeks or prn.  I spent 68 minutes providing this consultation. More than 50% of the time in this consultation was spent in counseling and care coordination.  PPS: 30%  Chief Complaint: Follow up palliative consult for complex medical decision making  HISTORY OF PRESENT ILLNESS:  [name] is a 55 y.o. year old female  with multiple medical problems including COPD, HTN, Chronic respiratory failure, O2 dependent on 3L/[place], CKD, DM, HTN, bipolar. Mechanical fall on the a.m. of 04/22/2021. She was found to have anterior right shoulder dislocation, Hospitalized 04/22/2021 to 05/04/2021.  Reduced by orthopedics on 7/29 and then again on 8/1.  Develop  acute on chronic hypoxemic and hypercarbic respiratory failure secondary to obstructive sleep apnea and requires 3 L nasal cannula oxygen.  Her echocardiogram was unremarkable with LVEF of 70% and CT angiogram was negative for PE. She was stabilized d/c to STR at [facility] where she currently resides. [name] was seen in ED for 08/19/2021 acute bronchitis then d/c back to [facility]. Ms. Sholtz requires assistance for transfers, mobility, adl's including bathing, dressing, toileting, feedings as she does continue to have a tremor. Ms. Margretta Sidle has been participating in therapy. Ms. Gorum is a full code. I visited and observed [name] lying in bed in her room at [facility]. Ms. Whitmill and I talked about how she has been  feeling today. Ms. Mericle endorses she is hoping to go home. We talked about realistic expectation. We talked about symptoms, appetite, debility at length. We talked about [name] having a family meeting with staff on Thursday. Discussed with Ms. Zbikowski doing home visits and would not be able to be present.  We talked about her husband unable to care for [name] at her home, he is getting older. We talked about her son having his own problems, [name] verbalized understanding. We talked about importance of if [name]'s son is able to care safety. We talked about pro's and con's of going to her son's home. We talked about realistic expectations. We talked about chronic disease with progression, functional and cognitive changes. We talked about her ability to self care. We talked about what she is able to do. We talked about quality of life. We talked about will continue to follow with pc either follow in LTC with transition or d/c home with home visit. [name] in agreement. Will continue to f/u following family meeting with facility. Therapeutic listening, emotional support provided. Questions answered. I updated staff and NP  History obtained from review of EMR, discussion with facility staff and Ms. Kramm.  I reviewed available labs, medications, imaging, studies and related documents from the EMR.  Records reviewed and summarized above.   ROS Full 10 system review of systems performed and negative with exception of: as per HPI.   Physical Exam: Constitutional: NAD General: obese, debilitated, smiling, chronically ill female EYES: lids intact ENMT: oral mucous membranes moist CV: S1S2, RRR, +BLE edema Pulmonary: LCTA, no increased work of breathing, no cough Abdomen: normo-active BS + 4 quadrants, soft and non tender MSK: bed bound;  Skin: warm and dry Neuro:  + generalized weakness,  + cognitive impairment Psych: non-anxious affect, A and O x 3  Questions and concerns were addressed. Provided general support and encouragement, no other unmet needs identified   Thank you for the opportunity to participate in the care of [name].  The palliative care team will continue to follow. Please call our office at [phone] if we can be of additional assistance.   This chart was  dictated using voice recognition software.  Despite best efforts to proofread,  errors can occur which can change the documentation meaning.   Shaylinn Hladik Ihor Gully, NP

## 2021-08-26 ENCOUNTER — Non-Acute Institutional Stay: Payer: Medicaid Other | Admitting: Nurse Practitioner

## 2021-08-26 ENCOUNTER — Encounter: Payer: Self-pay | Admitting: Nurse Practitioner

## 2021-08-26 VITALS — BP 122/61 | HR 77 | Temp 97.5°F | Resp 18

## 2021-08-26 DIAGNOSIS — Z515 Encounter for palliative care: Secondary | ICD-10-CM

## 2021-08-26 DIAGNOSIS — R0602 Shortness of breath: Secondary | ICD-10-CM

## 2021-08-26 DIAGNOSIS — J449 Chronic obstructive pulmonary disease, unspecified: Secondary | ICD-10-CM

## 2021-08-26 DIAGNOSIS — R5381 Other malaise: Secondary | ICD-10-CM

## 2021-08-26 NOTE — Progress Notes (Signed)
Designer, jewellery Palliative Care Consult Note Telephone: [phone]  Fax: [phone]    Date of encounter: 08/26/21 10:19 PM PATIENT NAME: [name] [address] [place] [address]   [phone] (home)  DOB: [birth year] MRN: [id number] PRIMARY CARE PROVIDER:   [name],  [place] [address] [phone]  RESPONSIBLE PARTY:    Contact Information     Name Relation Home Work Mobile   [name] Spouse [phone]     [name] [phone]  [phone]   [name],[name] Son   [phone]      I met face to face with patient in facility. Palliative Care was asked to follow this patient by consultation request of [name] to address advance care planning and complex medical decision making. This is a follow up visit.                                  ASSESSMENT AND PLAN / RECOMMENDATIONS: Symptom Management/Plan: 1. Advance Care Planning; Full code, full scope of treatment   2. Debility secondary to COPD, O2 dependence, chronic respiratory failure, bipolar Continue PT/OT at facility when completed, recommendation is to transition to LTC. Encourage mobility, self care as able. Will continue Palliative care. Discussed at length with [name] benefits of transitioning to LTC at [facility]. [name] in agreement as determined by careplan meeting yesterday. Family unable to care for [name] at home. We talked about expectations, disease progression. Most PC visit supportive.   Recommend psychiatry to re-evaluate medication regimen for bipolar as [name] despite multiple stressors, labile emotions with increase in manic expressions.    3. Shortness of breath secondary to COPD stable Continue inhalation therapy, monitor respiratory status, O2   4. Palliative care encounter; Palliative care encounter; Palliative medicine team will continue to  support patient, patient's family, and medical team. Visit consisted of counseling and education dealing with the complex and emotionally intense issues of symptom management and palliative care in the setting of serious and potentially life-threatening illness   6. f/u 4 weeks for ongoing monitoring chronic disease progression, ongoing discussions complex medical decision making   Follow up Palliative Care Visit: Palliative care will continue to follow for complex medical decision making, advance care planning, and clarification of goals. Return 4 weeks or prn.  I spent 38 minutes providing this consultation. More than 50% of the time in this consultation was spent in counseling and care coordination  PPS: 30% Chief Complaint: Follow up palliative consult for complex medical decision making  HISTORY OF PRESENT ILLNESS:  [name] is a 55 y.o. year old female  with multiple medical problems including COPD, HTN, Chronic respiratory failure, O2 dependent on 3L/[place], CKD, DM, HTN, bipolar. Mechanical fall on the a.m. of 04/22/2021. She was found to have anterior right shoulder dislocation, Hospitalized 04/22/2021 to 05/04/2021.  Reduced by orthopedics on 7/29 and then again on 8/1.  Develop  acute on chronic hypoxemic and hypercarbic respiratory failure secondary to obstructive sleep apnea and requires 3 L nasal cannula oxygen.  Her echocardiogram was unremarkable with LVEF of 70% and CT angiogram was negative for PE. She was stabilized d/c to STR at [name] where she currently resides. [name] was seen in ED for 08/19/2021 acute bronchitis then d/c back to [name]. [name] requires assistance for transfers, mobility, adl's including bathing,  dressing, toileting, feedings as she does continue to have a tremor. [name] has been participating in therapy. [name] is a full code. I visited and observed Casey. Tizando lying in bed in her room at [name]. [name] and I talked about  how she has been feeling today. See above discussion. Therapeutic listening, emotional support provided. Most PC visit discussion oabout transition to LTC with benefits, supportive role with positive reinforcement which [name] reacts best too as she had previously been my primary care patient at [name] under the house call program for length of time. [name] is well known to this provider.   History obtained from review of EMR, discussion with facility staff and [name].  I reviewed available labs, medications, imaging, studies and related documents from the EMR.  Records reviewed and summarized above.   ROS Full 10 system review of systems performed and negative with exception of: as per HPI.   Physical Exam: Constitutional: NAD General: obese, debilitated, elated female EYES: lids intact ENMT: oral mucous membranes moist CV: S1S2, RRR, BLE edema Pulmonary: LCTA, decrease bases, no increased work of breathing, + cough Abdomen: intake 100%, normo-active BS + 4 quadrants, soft and non tender MSK: bed-bound Skin: warm and dry Neuro:  + generalized weakness,  + cognitive impairment Psych: non-anxious affect, A and O x 3 Questions and concerns were addressed. Provided general support and encouragement, no other unmet needs identified  Thank you for the opportunity to participate in the care of [name].  The palliative care team will continue to follow. Please call our office at [phone] if we can be of additional assistance.  This chart was dictated using voice recognition software.  Despite best efforts to proofread,  errors can occur which can change the documentation meaning.  Uriah Philipson Ihor Gully, NP

## 2021-08-29 ENCOUNTER — Other Ambulatory Visit: Payer: Self-pay

## 2021-09-30 ENCOUNTER — Encounter: Payer: Self-pay | Admitting: Nurse Practitioner

## 2021-09-30 ENCOUNTER — Non-Acute Institutional Stay: Payer: Medicaid Other | Admitting: Nurse Practitioner

## 2021-09-30 VITALS — BP 128/88 | HR 88 | Temp 97.5°F | Resp 18 | Wt 280.1 lb

## 2021-09-30 DIAGNOSIS — Z515 Encounter for palliative care: Secondary | ICD-10-CM

## 2021-09-30 DIAGNOSIS — J449 Chronic obstructive pulmonary disease, unspecified: Secondary | ICD-10-CM

## 2021-09-30 DIAGNOSIS — R0602 Shortness of breath: Secondary | ICD-10-CM

## 2021-09-30 DIAGNOSIS — R5381 Other malaise: Secondary | ICD-10-CM

## 2021-09-30 NOTE — Progress Notes (Addendum)
[place] Consult Note Telephone: [phone]  Fax: [phone]    Date of encounter: 09/30/21 7:28 PM PATIENT NAME: NAJWA SPILLANE [address] [place] [address]   [phone] (home)  DOB: 11/11/1965 MRN: [id number] PRIMARY CARE PROVIDER:    [facility]  RESPONSIBLE PARTY:    Contact Information     Name Relation Home Work Mobile   [place] Spouse [phone]     Mardi Mainland [phone]  [phone]   Mehlhoff,christopher Son   [phone]      I met face to face with patient in facility. Palliative Care was asked to follow this patient by consultation request of  [facility] to address advance care planning and complex medical decision making. This is a follow up visit.                                  ASSESSMENT AND PLAN / RECOMMENDATIONS:  Symptom Management/Plan: 1. Advance Care Planning; Full code, full scope of treatment   2. Debility secondary to COPD, O2 dependence, chronic respiratory failure, bipolar, has transitioned to LTC, encourage oob, activities, encourage socialization, activities. Discussed with Ms. Pinson importance of self care, participating and working with staff.   3. Shortness of breath stable secondary to COPD Continue inhalation therapy, monitor respiratory status, O2   4. Palliative care encounter; Palliative care encounter; Palliative medicine team will continue to support patient, patient's family, and medical team. Visit consisted of counseling and education dealing with the complex and emotionally intense issues of symptom management and palliative care in the setting of serious and potentially life-threatening illness  5. Irritability secondary to bipolar; will defer to psychiatry, and recommend psychotherapy for counseling as new transition from home to now LTC, challenge with getting used to new environment.    6. f/u 4 weeks for ongoing  monitoring chronic disease progression, ongoing discussions complex medical decision making  Follow up Palliative Care Visit: Palliative care will continue to follow for complex medical decision making, advance care planning, and clarification of goals. Return 4 weeks or prn.  I spent 65 minutes providing this consultation. More than 50% of the time in this consultation was spent in counseling and care coordination. PPS: 30%  Chief Complaint: Follow up palliative consult for complex medical decision making  HISTORY OF PRESENT ILLNESS:  RAYNE COWDREY is a 56 y.o. year old female  with multiple medical problems including COPD, HTN, Chronic respiratory failure, O2 dependent on 3L/, CKD, DM, HTN, bipolar. Mechanical fall on the a.m. of 04/22/2021. She was found to have anterior right shoulder dislocation, Hospitalized 04/22/2021 to 05/04/2021.  Reduced by orthopedics on 7/29 and then again on 8/1.  Develop  acute on chronic hypoxemic and hypercarbic respiratory failure secondary to obstructive sleep apnea and requires 3 L nasal cannula oxygen.  Her echocardiogram was unremarkable with LVEF of 70% and CT angiogram was negative for PE. She was stabilized d/c to STR at [facility] where she currently resides. Ms. Savarino was seen in ED for 08/19/2021 acute bronchitis then d/c back to [facility]. Ms. Rossel requires assistance for transfers, mobility, adl's including bathing, dressing, toileting, feedings as she does continue to have a tremor. [name] has been participating in therapy. Ms. Nappier is a full code. I visited and observed [name] lying in bed in her room at [facility]. Ms. Ibrahim and I talked about how she  has been feeling today. Ms. Wernli talked about challenges with transitioning to LTC. Ms. Benegas ruminated about going home. Explained to Ms. Nowack the is not realistic as her level of care were her family can take care of her at home. Ms. Mcmillion and I talked at length about  realistic expectations. We talked about things at LTC that would bring her enjoyment, add quality to her life at the LTC. We reviewed ROS, symptoms, chronic disease. We talked about Ms. Tredway coping strategies. We talked about goc, role pc in poc. We talked about increasing in activities, getting oob, going to dining area to eat meals, interact more with other residents. Therapeutic listening, emotional support provided. Questions answered. Updated staff, encouraged oob, engage in more facility activities.   History obtained from review of EMR, discussion with facility staff and Ms. Rizo.  I reviewed available labs, medications, imaging, studies and related documents from the EMR.  Records reviewed and summarized above.   ROS Reviewed 10 point system with Ms. Ravert and facility staff all negative except HPI  Physical Exam: Constitutional: NAD General: obese, debilitated, female EYES: lids intact, ENMT: oral mucous membranes moist CV: S1S2, RRR, +BLE edema Pulmonary: clear, decrease bases no increased work of breathing, no cough Abdomen: intake 100%, normo-active BS + 4 quadrants, soft and non tender MSK: functional quadriplegic Skin: warm and dry Neuro:  + BLE with generalized weakness, + cognitive impairment Psych: non-anxious affect, A and O x 3 Thank you for the opportunity to participate in the care of Ms. Latner.  The palliative care team will continue to follow. Please call our office at [phone] if we can be of additional assistance.   This chart was dictated using voice recognition software.  Despite best efforts to proofread,  errors can occur which can change the documentation meaning.   Questions and concerns were addressed. Provided general support and encouragement, no other unmet needs identified   Camran Keady Ihor Gully, NP

## 2021-10-03 ENCOUNTER — Other Ambulatory Visit: Payer: Self-pay

## 2021-10-21 ENCOUNTER — Encounter: Payer: Self-pay | Admitting: Nurse Practitioner

## 2021-10-21 ENCOUNTER — Non-Acute Institutional Stay: Payer: Medicaid Other | Admitting: Nurse Practitioner

## 2021-10-21 VITALS — BP 128/88 | HR 78 | Temp 97.5°F | Resp 8 | Wt 280.1 lb

## 2021-10-21 DIAGNOSIS — J449 Chronic obstructive pulmonary disease, unspecified: Secondary | ICD-10-CM

## 2021-10-21 DIAGNOSIS — R0602 Shortness of breath: Secondary | ICD-10-CM

## 2021-10-21 DIAGNOSIS — Z515 Encounter for palliative care: Secondary | ICD-10-CM

## 2021-10-21 DIAGNOSIS — R5381 Other malaise: Secondary | ICD-10-CM

## 2021-10-21 NOTE — Progress Notes (Signed)
Therapist, nutritional Palliative Care Consult Note Telephone: [phone]  Fax: [phone]    Date of encounter: 10/21/21 8:14 PM PATIENT NAME: [name] [address] [place] [address]   [phone] (home)  DOB: [birth year] MRN: [id number] PRIMARY CARE PROVIDER:    [facility]  RESPONSIBLE PARTY:    Contact Information     Name Relation Home Work Mobile   [place] Spouse [phone]     Rudy Jew [phone]  [phone]   Wurtz,christopher Son   [phone]      I met face to face with patient in /facility. Palliative Care was asked to follow this patient by consultation request of [facility] to address advance care planning and complex medical decision making. This is a follow up visit.                                  ASSESSMENT AND PLAN / RECOMMENDATIONS: Symptom Management/Plan: 1. Advance Care Planning; Full code, full scope of treatment   2. Debility secondary to COPD, O2 dependence, chronic respiratory failure, bipolar Transitioned to LTC. Encourage mobility, self care as able. We talked about daily routine, oob to w/c. Socialization, activities, reading which [name] enjoys to do, brings quality to her life and joy.   Recommend psychiatry to re-evaluate medication regimen for bipolar as [name] despite multiple stressors, labile emotions with increase in manic expressions. Continue with psychiatry, psychotherapy   3. Shortness of breath secondary to COPD and possible OSA; discussed at length about the importance of sleep study. Talked with [name] at length about sleep study, purpose, with agreement of [name] to go. We talked about her fears, concerns, anxiety concerning going out of the facility for testing, sleep study lab. Questions answered. Continue inhalation therapy, monitor respiratory status, O2   4. Palliative care encounter; Palliative care encounter; Palliative  medicine team will continue to support patient, patient's family, and medical team. Visit consisted of counseling and education dealing with the complex and emotionally intense issues of symptom management and palliative care in the setting of serious and potentially life-threatening illness   6. f/u 4 weeks for ongoing monitoring chronic disease progression, ongoing discussions complex medical decision making  Follow up Palliative Care Visit: Palliative care will continue to follow for complex medical decision making, advance care planning, and clarification of goals. Return 4 weeks or prn.  I spent 71 minutes providing this consultation. More than 50% of the time in this consultation was spent in counseling and care coordination.  PPS: 40%  Chief Complaint: Follow up palliative consult for complex medical decision making  HISTORY OF PRESENT ILLNESS:  [name] is a 56 y.o. year old female  with with multiple medical problems including COPD, HTN, Chronic respiratory failure, O2 dependent on 3L/[place], CKD, DM, HTN, bipolar. Mechanical fall on the a.m. of 04/22/2021. She was found to have anterior right shoulder dislocation, Hospitalized 04/22/2021 to 05/04/2021.  Reduced by orthopedics on 7/29 and then again on 8/1.  Develop  acute on chronic hypoxemic and hypercarbic respiratory failure secondary to obstructive sleep apnea and requires 3 L nasal cannula oxygen.  Her echocardiogram was unremarkable with LVEF of 70% and CT angiogram was negative for PE. She was stabilized d/c to STR at [facility] where she currently resides and has now transitioned to LTC. [name] requires assistance for transfers, mobility, adl's including bathing, dressing,  toileting, feedings as she does continue to have a tremor. [name] feeds herself with good appetite, stable weight. [name] is a full code. I visited and observed [name] lying in bed in her room at [facility]. [name] and I talked about  how she has been feeling today. See above discussion. Therapeutic listening, emotional support provided. Most PC visit discussion oabout transition to LTC with benefits, supportive role with positive reinforcement  .   History obtained from review of EMR, discussion with facility staff and [name].  I reviewed available labs, medications, imaging, studies and related documents from the EMR.  Records reviewed and summarized above.   ROS 10 point system reviewed with [name] and facility staff all negative except HPI  Physical Exam: Constitutional: NAD General: obese pleasant debilitated female EYES: lids intact ENMT: oral mucous membranes moist CV: S1S2, RRR, +BLE edema Pulmonary: LCTA, no increased work of breathing, no cough Abdomen: intake 100%, normo-active BS + 4 quadrants, soft and non tender MSK: bed-bound; hoyer to w/c Skin: warm and dry Neuro:  +BLE generalized weakness,  + cognitive impairment Psych: non-anxious affect, A and O x 3  Thank you for the opportunity to participate in the care of [name].  The palliative care team will continue to follow. Please call our office at [phone] if we can be of additional assistance.   Questions and concerns were addressed. Provided general support and encouragement, no other unmet needs identified   This chart was dictated using voice recognition software.  Despite best efforts to proofread,  errors can occur which can change the documentation meaning.   Dean Wonder Ihor Gully, NP

## 2021-10-24 ENCOUNTER — Other Ambulatory Visit: Payer: Self-pay

## 2021-11-25 ENCOUNTER — Non-Acute Institutional Stay: Payer: Medicaid Other | Admitting: Nurse Practitioner

## 2021-11-25 ENCOUNTER — Encounter: Payer: Self-pay | Admitting: Nurse Practitioner

## 2021-11-25 ENCOUNTER — Other Ambulatory Visit: Payer: Self-pay

## 2021-11-25 VITALS — BP 127/78 | HR 76 | Temp 97.6°F | Resp 18 | Wt 281.0 lb

## 2021-11-25 DIAGNOSIS — Z515 Encounter for palliative care: Secondary | ICD-10-CM

## 2021-11-25 DIAGNOSIS — J449 Chronic obstructive pulmonary disease, unspecified: Secondary | ICD-10-CM

## 2021-11-25 DIAGNOSIS — R0602 Shortness of breath: Secondary | ICD-10-CM

## 2021-11-25 DIAGNOSIS — R5381 Other malaise: Secondary | ICD-10-CM

## 2021-11-25 NOTE — Progress Notes (Signed)
? ? ?Manufacturing engineer ?Community Palliative Care Consult Note ?Telephone: [phone]  ?Fax: [phone]  ? ? ?Date of encounter: 11/25/21 ?2:19 PM ?PATIENT NAME: NYCOLE KAWAHARA ?[place] [address]   ?[phone] (home)  ?DOB: 07/23/1966 ?MRN: [id number] ?PRIMARY CARE PROVIDER:    ?[facility] ? ?RESPONSIBLE PARTY:    ?Contact Information   ? ? Name Relation Home Work Mobile  ? Pontiff,Justo Spouse [phone]    ? Mardi Mainland [phone]  [phone]  ? Hapke,christopher Son   [phone]  ? ?  ? ?I met face to face with patient in facility. Palliative Care was asked to follow this patient by consultation request of  [place] to address advance care planning and complex medical decision making. This is a follow up visit.                                  ?ASSESSMENT AND PLAN / RECOMMENDATIONS:  ?Symptom Management/Plan: ?1. Advance Care Planning; Full code, full scope of treatment ?  ?2. Debility secondary to COPD, O2 dependence, chronic respiratory failure, bipolar ? Encourage mobility, self care as able. We talked about daily routine, oob to w/c. Socialization, activities, reading which Ms. Bortle enjoys to do, 56 brings quality to her life and joy, visitors she is now having with church members, activities are brining her things to do. Ms. Siegenthaler endorse she moves her arms and legs in bed, gets hoyer 3 times a week to the shower which she likes to be out of bed. Ms. Newlun endorses she spends a lot of time looking out the window, looks forward to her husband and daughter coming to see her. Emotional support provided.   ?  ?3. Shortness of breath secondary to COPD; stable Continue inhalation therapy, monitor respiratory status, O2 prn as she no longer requires O2 ?  ?4. Palliative care encounter; Palliative care encounter; Palliative medicine team will continue to support patient, patient's family, and medical team. Visit consisted of  counseling and education dealing with the complex and emotionally intense issues of symptom management and palliative care in the setting of serious and potentially life-threatening illness ?  ?6. f/u 4 weeks for ongoing monitoring chronic disease progression, ongoing discussions complex medical decision making ?Follow up Palliative Care Visit: Palliative care will continue to follow for complex medical decision making, advance care planning, and clarification of goals. Return 4 weeks or prn. ? ?I spent 38 minutes providing this consultation. More than 50% of the time in this consultation was spent in counseling and care coordination. ? ?PPS: 30% ? ?Chief Complaint: Follow up palliative consult for complex medical decision making ? ?HISTORY OF PRESENT ILLNESS:  BONNA STEURY is a 56 y.o. year old female  with multiple medical problems including COPD, HTN, Chronic respiratory failure, O2 dependent on 3L/[place], CKD, DM, HTN, bipolar. Ms. Haggard currently resides LTC at [facility]. Ms. Herbig requires assistance for mobility, transfers to include hoyer lift to w/c. Ms. Ytuarte requires total adl's assist with bathing, dressing. Ms. Gadison feeds herself with good appetite. Current weight 281 lbs. No recent falls, wounds, infections, hospitalizations. Staff endorses multiple room changes due to Ms. Claytor and other residents with conflict. I visited and observed Ms. Nippert lying in bed, watching TV. We talked about how Ms. Maffeo has been feeling. We talked about ros, symptoms, medical goals, recent room-mate with new room. We talked at length about transition to  LTC. Ms. Hearn talked at length about the daily activities she enjoys, listening to her music, reading. Ms. Stagliano talked about quality of life, enjoying being at [place]. Therapeutic listening, emotional support provided. Questions answered. We talked about role pc in poc. Most PC visit supportive.  ? ?History obtained from review of EMR, discussion with facility  staff and Ms. Jaskiewicz.  ?I reviewed available labs, medications, imaging, studies and related documents from the EMR.  Records reviewed and summarized above.  ? ?ROS ?10 point system reviewed with Ms. Kuri and staff all negative except HPI ? ?Physical Exam: ?Constitutional: NAD ?General: obese, debilitated, pleasant female ?EYES:  lids intact ?ENMT:  oral mucous membranes moist ?CV: S1S2, RRR, BLE edema ?Pulmonary: LCTA, no increased work of breathing, no cough, room air ?Abdomen: intake 100%, normo-active BS + 4 quadrants, soft and non tender, ?MSK: MAE x 4; w/c dependent ?Skin: warm and dry ?Neuro:  + generalized weakness,  + cognitive impairment ?Psych: non-anxious affect, A and O x 3 ?Thank you for the opportunity to participate in the care of Ms. Line.  The palliative care team will continue to follow. Please call our office at [phone] if we can be of additional assistance.  ? ?Modesto Ganoe Z Kamen Hanken, NP ?

## 2021-12-19 ENCOUNTER — Other Ambulatory Visit: Payer: Self-pay

## 2021-12-19 ENCOUNTER — Non-Acute Institutional Stay: Payer: Medicaid Other | Admitting: Nurse Practitioner

## 2021-12-19 ENCOUNTER — Encounter: Payer: Self-pay | Admitting: Nurse Practitioner

## 2021-12-19 VITALS — BP 127/78 | HR 82 | Temp 97.6°F | Resp 18 | Wt 271.0 lb

## 2021-12-19 DIAGNOSIS — J449 Chronic obstructive pulmonary disease, unspecified: Secondary | ICD-10-CM

## 2021-12-19 DIAGNOSIS — Z515 Encounter for palliative care: Secondary | ICD-10-CM

## 2021-12-19 DIAGNOSIS — R5381 Other malaise: Secondary | ICD-10-CM

## 2021-12-19 DIAGNOSIS — R0602 Shortness of breath: Secondary | ICD-10-CM

## 2021-12-19 NOTE — Progress Notes (Addendum)
? ? ?Manufacturing engineer ?Community Palliative Care Consult Note ?Telephone: [phone]  ?Fax: [phone]  ? ? ?Date of encounter: 12/19/21 ?5:04 PM ?PATIENT NAME: CAREE WOLPERT ?[place] [address]   ?[phone] (home)  ?DOB: 1966-09-03 ?MRN: [id number] ?PRIMARY CARE PROVIDER:    ?[facility] ? ?RESPONSIBLE PARTY:    ?Contact Information   ? ? Name Relation Home Work Mobile  ? Mcghie,Justo Spouse [phone]    ? Mardi Mainland [phone]  [phone]  ? Shaikh,christopher Son   [phone]  ? ?  ? ?I met face to face with patient in facility. Palliative Care was asked to follow this patient by consultation request of  [place] to address advance care planning and complex medical decision making. This is a follow up visit.                                  ?ASSESSMENT AND PLAN / RECOMMENDATIONS:  ?Symptom Management/Plan: ?1. Advance Care Planning; Full code, full scope of treatment ?  ?2. Debility secondary to COPD, O2 dependence, chronic respiratory failure, bipolar; Ms. Diekman requested PC visit today for discussion on mobility. Ms. Labate talked about standing twice with therapy for 5 minutes. Ms. Weyand was very excited talking about her goals for therapy, hopes to ambulate. We talked about positive motivation. Reinforced realistic expectations. We talked about ros, symptoms with are negative, appetite with weight and encouraging weight loss. We talked about residing at [facility] facility at length about what she likes and challenges she has. We talked about medical goals, role pc in poc.  ?  ?3. Shortness of breath secondary to COPD; stable Continue inhalation therapy, monitor respiratory status, O2 prn as she no longer requires O2 ?  ?4. Palliative care encounter; Palliative care encounter; Palliative medicine team will continue to support patient, patient's family, and medical team. Visit consisted of counseling and education dealing with the  complex and emotionally intense issues of symptom management and palliative care in the setting of serious and potentially life-threatening illness ?  ?6. f/u 4 weeks for ongoing monitoring chronic disease progression, ongoing discussions complex medical decision making ? ?Follow up Palliative Care Visit: Palliative care will continue to follow for complex medical decision making, advance care planning, and clarification of goals. Return 8 weeks or prn. ? ?I spent 45 minutes providing this consultation starting at 2:00pm. More than 50% of the time in this consultation was spent in counseling and care coordination. ? ?PPS: 40% ? ?Chief Complaint: Follow up palliative consult for complex medical decision making ? ?HISTORY OF PRESENT ILLNESS:  JENNAVIEVE Shepherd is a 56 y.o. year old female  with multiple medical problems including COPD, HTN, Chronic respiratory failure, O2 dependent on 3L/[place], CKD, DM, HTN, bipolar. Ms. Martinovich currently resides LTC at [facility]. Ms. Justice requires assistance for mobility, transfers to include hoyer lift to w/c. Ms. Fredrick requires total adl's assist with bathing, dressing. Ms. Haacke feeds herself with good appetite. Current weight 217 lbs with 10 lbs weight loss. No recent falls, wounds, infections, hospitalizations. Ms. Rottman requested PC visit today for discussion on mobility. Ms. Langer talked about standing twice with therapy for 5 minutes. Ms. Messing was very excited talking about her goals for therapy, hopes to ambulate. We talked about positive motivation. Reinforced realistic expectations. We talked about ros, symptoms with are negative, appetite with weight and encouraging weight loss. We talked about residing  at [facility] facility at length about what she likes and challenges she has. We talked about medical goals, role pc in poc. Most of PC visit today supportive role. Therapeutic listening, emotional support provided. Questions answered.  ? ?History obtained from review of EMR,  discussion with primary team, and interview with family, facility staff/caregiver and/or [name].  ?I reviewed available labs, medications, imaging, studies and related documents from the EMR.  Records reviewed and summarized above.  ? ?ROS ?10 point system reviewed all negative except HPI ? ?Physical Exam: ?Constitutional: NAD ?General: obese, debilitated pleasant female ?EYES: lids intact ?ENMT: oral mucous membranes moist ?CV: S1S2, RRR, +BLE edema ?Pulmonary: LCTA, no increased work of breathing, no cough, room air ?Abdomen: normo-active BS + 4 quadrants, soft and non tender ?MSK: w/c dependent; starting to stand for few minutes ?Skin: warm and dry ?Neuro:  + generalized weakness,  no cognitive impairment ?Psych: non-anxious affect, A and O x 3 ?Thank you for the opportunity to participate in the care of Ms. Cupit.  The palliative care team will continue to follow. Please call our office at [phone] if we can be of additional assistance.  ? ?Jareth Pardee Z Maciej Schweitzer, NP  ?

## 2021-12-22 ENCOUNTER — Ambulatory Visit: Payer: Medicaid Other | Admitting: Pulmonary Disease

## 2022-01-09 ENCOUNTER — Encounter: Payer: Self-pay | Admitting: Nurse Practitioner

## 2022-01-09 ENCOUNTER — Non-Acute Institutional Stay: Payer: Medicaid Other | Admitting: Nurse Practitioner

## 2022-01-09 VITALS — BP 127/78 | HR 76 | Temp 97.6°F | Resp 18 | Wt 271.0 lb

## 2022-01-09 DIAGNOSIS — R0602 Shortness of breath: Secondary | ICD-10-CM

## 2022-01-09 DIAGNOSIS — R5381 Other malaise: Secondary | ICD-10-CM

## 2022-01-09 DIAGNOSIS — Z515 Encounter for palliative care: Secondary | ICD-10-CM

## 2022-01-09 DIAGNOSIS — J449 Chronic obstructive pulmonary disease, unspecified: Secondary | ICD-10-CM

## 2022-01-09 NOTE — Progress Notes (Signed)
? ? ?Manufacturing engineer ?Community Palliative Care Consult Note ?Telephone: [phone]  ?Fax: [phone]  ? ? ?Date of encounter: 01/09/22 ?2:16 PM ?PATIENT NAME: [name] ?[place] [address]   ?[phone] (home)  ?DOB: [birth year] ?MRN: [id number] ?PRIMARY CARE PROVIDER:    ?[facility] ? ?RESPONSIBLE PARTY:    ?Contact Information   ? ? Name Relation Home Work Mobile  ? Strey,Justo Spouse [phone]    ? Mardi Mainland [phone]  [phone]  ? Moder,christopher Son   [phone]  ? ?  ? ?I met face to face with patient in facility. Palliative Care was asked to follow this patient by consultation request of  [place] to address advance care planning and complex medical decision making. This is a follow up visit.                                  ?ASSESSMENT AND PLAN / RECOMMENDATIONS:  ?Symptom Management/Plan: ?1. Advance Care Planning; Full code, full scope of treatment ?  ?2. Debility secondary to COPD, O2 dependence, chronic respiratory failure, bipolar; Ms. Garro requested PC visit today for discussion on mobility. Ms. Kemnitz talked about working with therapy, she is eager to do her session today. We talked about her goals with therapy to walk. We talked about realistic expectations. We talked about importance of nutrition, weight loss, decrease calories, proportions. Education completed, increasing water rather than soft drinks. ?  ?3. Shortness of breath secondary to COPD; stable Continue inhalation therapy, monitor respiratory status, O2 prn as she no longer requires O2 ?  ?4. Palliative care encounter; Palliative care encounter; Palliative medicine team will continue to support patient, patient's family, and medical team. Visit consisted of counseling and education dealing with the complex and emotionally intense issues of symptom management and palliative care in the setting of serious and potentially life-threatening  illness ?  ?6. f/u 4 weeks for ongoing monitoring chronic disease progression, ongoing discussions complex medical decision making ? ?Follow up Palliative Care Visit: Palliative care will continue to follow for complex medical decision making, advance care planning, and clarification of goals. Return 4 weeks or prn. ? ?I spent 47 minutes providing this consultation. More than 50% of the time in this consultation was spent in counseling and care coordination. ?PPS: 40% ? ?Chief Complaint: Follow up palliative consult for complex medical decision making ? ?HISTORY OF PRESENT ILLNESS:  ARLETA OSTRUM is a 56 y.o. year old female  with multiple medical problems including COPD, HTN, Chronic respiratory failure, O2 dependent on 3L/[place], CKD, DM, HTN, bipolar. Ms. Gentz currently resides LTC at [facility]. Ms. Coleson requires assistance for mobility, transfers to include hoyer lift to w/c. Ms. Orrego requires total adl's assist with bathing, dressing. Ms. Yount feeds herself with good appetite. No recent falls, wounds, infections, hospitalizations. Ms. Canto requested PC visit today for discussion on mobility. Ms. Gomm talked therapy sessions with goals, realistic expectations. Ms. Rupert was very excited talking about her goals for therapy, hopes to ambulate. We talked about positive motivation. We talked about ros, symptoms with are negative, appetite with weight and encouraging weight loss. We talked about residing at [facility] facility at length about what she likes and challenges she has. We talked about medical goals, role pc in poc. Ms. Zinger talked about her son Gerald Stabs at length about asking for money, calling daily in need of assistance or things. We talked  about setting boundaries, coping strategies. Most of PC visit today supportive role. Therapeutic listening, emotional support provided. Questions answered.  ? ?History obtained from review of EMR, discussion with facility staff and Ms. Colt.  ?I reviewed  available labs, medications, imaging, studies and related documents from the EMR.  Records reviewed and summarized above.  ? ?ROS ?10 point system reviewed all negative except HPI ? ?Physical Exam: ?Constitutional: NAD ?General: obese, debilitated, pleasant female ?EYES: lids intact ?ENMT: oral mucous membranes moist ?CV: S1S2, RRR, +BLE edema ?Pulmonary: LCTA, no increased work of breathing, no cough, room air ?Abdomen: normo-active BS + 4 quadrants, soft and non tender ?MSK: w/c dependent, able to stand for <30 seconds with therapy; hoyer lift for transfers ?Skin: warm and dry ?Neuro:  +BLE weakness,  no cognitive impairment ?Psych: non-anxious affect, A and O x 3 ?Thank you for the opportunity to participate in the care of Ms. Capron.  The palliative care team will continue to follow. Please call our office at [phone] if we can be of additional assistance.  ? ?Maha Fischel Z Darcus Edds, NP  ? ?  ?

## 2022-01-12 ENCOUNTER — Encounter: Payer: Self-pay | Admitting: Pulmonary Disease

## 2022-01-12 ENCOUNTER — Ambulatory Visit (INDEPENDENT_AMBULATORY_CARE_PROVIDER_SITE_OTHER): Payer: Medicaid Other | Admitting: Pulmonary Disease

## 2022-01-12 VITALS — BP 140/76 | HR 84 | Temp 98.4°F | Wt 248.0 lb

## 2022-01-12 DIAGNOSIS — J9611 Chronic respiratory failure with hypoxia: Secondary | ICD-10-CM

## 2022-01-12 DIAGNOSIS — R682 Dry mouth, unspecified: Secondary | ICD-10-CM

## 2022-01-12 DIAGNOSIS — Z6841 Body Mass Index (BMI) 40.0 and over, adult: Secondary | ICD-10-CM

## 2022-01-12 DIAGNOSIS — J449 Chronic obstructive pulmonary disease, unspecified: Secondary | ICD-10-CM | POA: Diagnosis not present

## 2022-01-12 DIAGNOSIS — J9612 Chronic respiratory failure with hypercapnia: Secondary | ICD-10-CM

## 2022-01-12 NOTE — Progress Notes (Signed)
? ?[facility] ? ?Chief Complaint  ?Patient presents with  ? Follow-up  ?  Dry cough and sob with activity.   ? ? ?Past Surgical History:  ?She  has a past surgical history that includes Abdominal hysterectomy; Oophorectomy; Shoulder Closed Reduction (Right, 04/22/2021); and Shoulder Closed Reduction (Right, 04/25/2021). ? ?Past Medical History:  ?Anxiety, DM type 2, HTN, Bipolar, HLD, Vit D deficiency, Neuropathy ? ?Constitutional:  ?BP 140/76 (BP Location: Left Arm, Cuff Size: Large)   Pulse 84   Temp 98.4 ?F (36.9 ?C) (Temporal)   Wt 248 lb (112.5 kg) Comment: weight is per patient  SpO2 93%   BMI 40.03 kg/m?  ? ?Brief Summary:  ?[name] is a 56 y.o. female with chronic hypoxic/hypercapnic respiratory failure in setting of sleep disordered breathing with obesity hypoventilation syndrome and possible obstructive lung disease. ?  ? ? ? ?Subjective:  ? ?She is here with her husband and advocate, Ed Armed forces training and education officer. ? ?Her BMI in September 2022 was 51.89. ? ?She feels her breathing is better after losing weight.  She doesn't have much of an appetite and has a dry mouth.  She was changed from abilify.  She didn't get overnight oxygen study done.  She says that she isn't using oxygen at the facility.  She says they don't always giver her the nebulizer medicine.  She is not having cough, wheeze, sputum, or chest pain.  She says that put her in a lift device to check her weight several months ago, but the device didn't work and she was worried she was going to fall to the ground.  She decided she wouldn't let them put her in that type of device again.  She reports making progress with PT and is not able to walk with assistance for short distances. ? ?Physical Exam:  ? ?Appearance - well kempt, in a stretcher ? ?ENMT - no sinus tenderness, no oral exudate, no LAN, Mallampati 4 airway, no stridor ? ?Respiratory - equal breath sounds bilaterally, no wheezing or rales ? ?CV - s1s2  regular rate and rhythm, no murmurs ? ?Ext - no clubbing, no edema ? ?Skin - no rashes ? ?Psych - normal mood and affect ? ?  ?Pulmonary testing:  ?ABG on 50% FiO2 04/23/21 >> pH 7.28, PCO2 72.3, PO2 85 ? ?Chest Imaging:  ?CT chest 04/22/21 >> atelectasis, few scattered calcified micronodules, enlarged PA ?CT angio chest 04/27/21 >> atelectasis ? ?Sleep Tests:  ? ? ?Cardiac Tests:  ?Doppler legs b/l 04/23/21 >> no DVT ?Echo 04/23/21 >> EF 70 to 75%, mild LVH, mild RV enlargement ? ?Social History:  ?She  reports that she has never smoked. She has never used smokeless tobacco. She reports that she does not drink alcohol and does not use drugs. ? ?Family History:  ?Her family history is not on file. ?  ? ? ?Assessment/Plan:  ? ?Chronic hypoxic, hypercapnic respiratory failure. ?- likely from sleep disordered breathing and possible obstructive lung disease ?- suspect this has improved since she has lost significant amount of weight ?- will arrange for overnight oximetry on room air and then determine if she needs additional evaluation for sleep disordered breathing ? ?Possible obstructive lung disease. ?- respiratory symptoms improved with weight loss ?- continue pulmicort 0.25 mg nebulized bid and prn duoneb ?- defer PFT testing for now ? ?Obesity. ?- encouraged her to keep up with weight loss efforts ? ?Dry mouth. ?- advised that she could try using biotene  and she should sip water ?- advised her to d/w her other providers about whether her medications could be contributing to mouth dryness ? ?Time Spent Involved in Patient Care on Day of Examination:  ?36 minutes ? ?Follow up:  ? ?Patient Instructions  ?Will arrange for overnight oxygen test on room air ? ?Check with her facility to see if she is getting nebulizer treatments and whether they can monitor her weight ? ?Follow up in 4 months ? ?Medication List:  ? ?Allergies as of 01/12/2022   ? ?   Reactions  ? Bee Venom Hives  ? ?  ? ?  ?Medication List  ?  ? ?  ? Accurate  as of January 12, 2022 11:45 AM. If you have any questions, ask your nurse or doctor.  ?  ?  ? ?  ? ?STOP taking these medications   ? ?ARIPiprazole 30 MG tablet ?Commonly known as: ABILIFY ?Stopped by: Chesley Mires, MD ?  ? ?  ? ?TAKE these medications   ? ?Acetaminophen Extra Strength 500 MG tablet ?Generic drug: acetaminophen ?Take 1,000 mg by mouth every 6 (six) hours as needed. ?  ?aspirin EC 81 MG tablet ?Take 81 mg by mouth every morning. ?  ?Biotin 10 MG Caps ?Take 1 capsule by mouth daily. ?  ?budesonide 0.25 MG/2ML nebulizer solution ?Commonly known as: PULMICORT ?Take 2 mLs (0.25 mg total) by nebulization 2 (two) times daily. ?  ?docusate sodium 100 MG capsule ?Commonly known as: COLACE ?Take 100 mg by mouth 3 (three) times daily. ?  ?insulin aspart 100 UNIT/ML injection ?Commonly known as: novoLOG ?CBG < 70: implement hypoglycemia protocol ?CBG 70 - 120: 0 units ?CBG 121 - 150: 3 units ?CBG 151 - 200: 4 units ?CBG 201 - 250: 7 units ?CBG 251 - 300: 11 units ?CBG 301 - 350: 15 units ?CBG 351 - 400: 20 units ?  ?insulin glargine 100 UNIT/ML injection ?Commonly known as: LANTUS ?Inject 0.45 mLs (45 Units total) into the skin 2 (two) times daily. ?  ?ipratropium-albuterol 0.5-2.5 (3) MG/3ML Soln ?Commonly known as: DUONEB ?Take 3 mLs by nebulization every 4 (four) hours as needed. ?  ?isosorbide mononitrate 30 MG 24 hr tablet ?Commonly known as: IMDUR ?Take 30 mg by mouth daily. ?  ?loratadine 10 MG tablet ?Commonly known as: CLARITIN ?Take 10 mg by mouth daily. ?  ?metFORMIN 500 MG tablet ?Commonly known as: GLUCOPHAGE ?Take by mouth 2 (two) times daily with a meal. ?  ?nystatin powder ?Commonly known as: MYCOSTATIN/NYSTOP ?Apply topically 2 (two) times daily. ?  ?oxybutynin 5 MG tablet ?Commonly known as: DITROPAN ?Take 5 mg by mouth 3 (three) times daily. ?  ?pravastatin 40 MG tablet ?Commonly known as: PRAVACHOL ?Take 40 mg by mouth daily. ?  ?propranolol 60 MG tablet ?Commonly known as: INDERAL ?Take 60 mg  by mouth daily. On an empty stomach ?  ?PROzac 20 MG capsule ?Generic drug: FLUoxetine ?Take 20 mg by mouth daily. ?  ?traZODone 50 MG tablet ?Commonly known as: DESYREL ?Take 50 mg by mouth at bedtime. ?  ?Victoza 18 MG/3ML Sopn ?Generic drug: liraglutide ?Inject 1.8 mg into the skin daily. ?  ?Vitamin D3 50 MCG (2000 UT) Tabs ?Take 1 tablet by mouth daily. ?  ? ?  ? ? ?Signature:  ?Chesley Mires, MD ?[place] ?Pager - [phone] ?01/12/2022, 11:45 AM ?  ? ? ? ? ? ? ? ? ?

## 2022-01-12 NOTE — Patient Instructions (Signed)
Will arrange for overnight oxygen test on room air ? ?Check with her facility to see if she is getting nebulizer treatments and whether they can monitor her weight ? ?Follow up in 4 months ?

## 2022-01-30 ENCOUNTER — Ambulatory Visit: Payer: Self-pay | Admitting: Internal Medicine

## 2022-01-30 NOTE — Progress Notes (Signed)
Pt's appointment was canceled due to not needing one at this time.

## 2022-02-03 ENCOUNTER — Non-Acute Institutional Stay: Payer: Medicaid Other | Admitting: Nurse Practitioner

## 2022-02-03 ENCOUNTER — Encounter: Payer: Self-pay | Admitting: Nurse Practitioner

## 2022-02-03 VITALS — BP 137/86 | HR 88 | Temp 97.1°F | Resp 18 | Wt 281.9 lb

## 2022-02-03 DIAGNOSIS — R0602 Shortness of breath: Secondary | ICD-10-CM

## 2022-02-03 DIAGNOSIS — R5381 Other malaise: Secondary | ICD-10-CM

## 2022-02-03 DIAGNOSIS — J449 Chronic obstructive pulmonary disease, unspecified: Secondary | ICD-10-CM

## 2022-02-03 DIAGNOSIS — Z515 Encounter for palliative care: Secondary | ICD-10-CM

## 2022-02-03 NOTE — Progress Notes (Signed)
? ? ?Manufacturing engineer ?Community Palliative Care Consult Note ?Telephone: [phone]  ?Fax: [phone]  ? ? ?Date of encounter: 02/03/22 ?5:44 PM ?PATIENT NAME: [name] ?[place] [address]   ?[phone] (home)  ?DOB: 11-12-1965 ?MRN: [id number] ?PRIMARY CARE PROVIDER:    ?[facility] ? ?RESPONSIBLE PARTY:    ?Contact Information   ? ? Name Relation Home Work Mobile  ? Shepherd,Casey Spouse [phone]    ? [name] [phone]  [phone]  ? Shepherd,Casey Son   [phone]  ? ?  ? ?I met face to face with patient in facility. Palliative Care was asked to follow this patient by consultation request of  [place] to address advance care planning and complex medical decision making. This is a follow up visit                                  ?ASSESSMENT AND PLAN / RECOMMENDATIONS:  ?Symptom Management/Plan: ?1. Advance Care Planning; Full code, full scope of treatment ?  ?2. Debility secondary to COPD, O2 dependence, chronic respiratory failure, bipolar; [name] requested PC visit today for discussion on mobility. [name] talked about working with therapy, she is eager to do her session today. We talked about her goals with therapy to walk. We talked about realistic expectations. We talked about importance of nutrition, weight loss, decrease calories, proportions. Education completed, increasing water rather than soft drinks. ?  ?3. Shortness of breath secondary to COPD; currently with PNA, continue abx, Continue inhalation therapy, monitor respiratory status, O2 supplemental ?  ?4. Palliative care encounter; Palliative care encounter; Palliative medicine team will continue to support patient, patient's family, and medical team. Visit consisted of counseling and education dealing with the complex and emotionally intense issues of symptom management and palliative care in the setting of serious and potentially life-threatening  illness ?  ?6. f/u 2 weeks for ongoing monitoring chronic disease progression, ongoing discussions complex medical decision making ?Follow up Palliative Care Visit: Palliative care will continue to follow for complex medical decision making, advance care planning, and clarification of goals. Return 2 weeks or prn. ? ?I spent 47 minutes providing this consultation starting at 12:00pm. More than 50% of the time in this consultation was spent in counseling and care coordination. ?PPS: 40% ?Chief Complaint: Follow up palliative consult for complex medical decision making ? ?HISTORY OF PRESENT ILLNESS:  [name] is a 56 y.o. year old female  with multiple medical problems including COPD, HTN, Chronic respiratory failure, O2 dependent on 3L/[place], CKD, DM, HTN, bipolar. [name] currently resides LTC at [facility]. [name] requires assistance for mobility, transfers to include hoyer lift to w/c. [name] requires total adl's assist with bathing, dressing. [name] feeds herself with good appetite. No recent falls, wounds, , hospitalizations. [name] requested PC visit today for pna, talked about abx, O2, cough and debility, importance of rest We talked about positive motivation. We talked about ros, symptoms with are negative, appetite which has been decreased. Talked about bland foods. We talked about residing at [facility] at length about what she likes and challenges she has. We talked about medical goals, role pc in poc. coping strategies. Most of PC visit today supportive role. Therapeutic listening, emotional support provided. Questions answered.  ?History obtained from review of EMR, discussion with facility staff and [name].  ?I reviewed available labs, medications, imaging,  studies and related documents from the EMR.  Records reviewed and summarized above.  ?  ?ROS ?10 point system reviewed all negative except HPI ?Physical Exam: ?Constitutional: NAD ?General: obese, debilitated, pleasant  female ?EYES: lids intact ?ENMT: oral mucous membranes moist ?CV: S1S2, RRR, +BLE edema ?Pulmonary: +rhonchi with decrease bases, no increased work of breathing,  cough,  ?Abdomen: normo-active BS + 4 quadrants, soft and non tender ?MSK: w/c dependent, able to stand for <30 seconds with therapy; hoyer lift for transfers ?Skin: warm and dry ?Neuro:  +BLE weakness,  no cognitive impairment ?Psych: non-anxious affect, A and O x 3 .  ?History obtained from review of EMR, discussion with primary team, and interview with family, facility staff/caregiver and/or [name].  ?I reviewed available labs, medications, imaging, studies and related documents from the EMR.  Records reviewed and summarized above.  ?Thank you for the opportunity to participate in the care of [name].  The palliative care team will continue to follow. Please call our office at [phone] if we can be of additional assistance.  ? ?Shaletta Hinostroza Z Elonzo Sopp, NP  ?  ?

## 2022-02-16 DIAGNOSIS — Z23 Encounter for immunization: Secondary | ICD-10-CM | POA: Diagnosis not present

## 2022-02-17 ENCOUNTER — Non-Acute Institutional Stay: Payer: Medicaid Other | Admitting: Nurse Practitioner

## 2022-02-17 ENCOUNTER — Encounter: Payer: Self-pay | Admitting: Nurse Practitioner

## 2022-02-17 VITALS — BP 137/86 | HR 88 | Temp 97.5°F | Resp 18 | Wt 281.9 lb

## 2022-02-17 DIAGNOSIS — Z515 Encounter for palliative care: Secondary | ICD-10-CM

## 2022-02-17 DIAGNOSIS — R5381 Other malaise: Secondary | ICD-10-CM

## 2022-02-17 DIAGNOSIS — R451 Restlessness and agitation: Secondary | ICD-10-CM

## 2022-02-17 DIAGNOSIS — F319 Bipolar disorder, unspecified: Secondary | ICD-10-CM

## 2022-02-17 NOTE — Progress Notes (Signed)
Therapist, nutritional Palliative Care Consult Note Telephone: [phone]  Fax: [phone]    Date of encounter: 02/17/22 6:47 PM PATIENT NAME: [name] [address] [place] [address]   [phone] (home)  DOB: [birth year] MRN: [id number] PRIMARY CARE PROVIDER:   [facility], [place],  [place] [address] [place] [address] [phone]  RESPONSIBLE PARTY:    Contact Information     Name Relation Home Work Mobile   [place] Spouse [phone]     Rudy Jew [phone]  [phone]   Tallie,christopher Son   [phone]      I met face to face with patient in facility. Palliative Care was asked to follow this patient by consultation request of  [facility]  to address advance care planning and complex medical decision making. This is a follow up visit.                                  ASSESSMENT AND PLAN / RECOMMENDATIONS:  Symptom Management/Plan: 1. Advance Care Planning; Full code, full scope of treatment   2. Severe agitation secondary to uncontrolled bipolar management; previously Ms. Straw had been  3. Debility secondary to COPD, O2 dependence, chronic respiratory failure, bipolar;   4. Palliative care encounter; Palliative care encounter; Palliative medicine team will continue to support patient, patient's family, and medical team. Visit consisted of counseling and education dealing with the complex and emotionally intense issues of symptom management and palliative care in the setting of serious and potentially life-threatening illness   6. f/u 1 weeks for ongoing monitoring chronic disease progression, ongoing discussions complex medical decision making Follow up Palliative Care Visit: Palliative care will continue to follow for complex medical decision making, advance care planning, and clarification of goals. Return 2 weeks or prn.   I spent 47 minutes  providing this consultation starting at 12:30 pm. More than 50% of the time in this consultation was spent in counseling and care coordination. PPS: 40% Chief Complaint: Follow up palliative consult for complex medical decision making   HISTORY OF PRESENT ILLNESS:  [name] is a 56 y.o. year old female  with multiple medical problems including COPD, HTN, Chronic respiratory failure, O2 dependent on 3L/[place], CKD, DM, HTN, bipolar. [name] currently resides LTC at [facility]. [name] requires assistance for mobility, transfers to include hoyer lift to w/c. [name] requires total adl's assist with bathing, dressing. [name] feeds herself with good appetite. No recent falls, wounds, , hospitalizations. History obtained from review of EMR, discussion with facility staff and Ms. Hutmacher.  I reviewed available labs, medications, imaging, studies and related documents from the EMR.  Records reviewed and summarized above.    ROS 10 point system reviewed all negative except HPI Physical Exam: Constitutional: NAD General: obese, debilitated, pleasant female EYES: lids intact ENMT: oral mucous membranes moist CV: S1S2, RRR, +BLE edema Pulmonary: +rhonchi with decrease bases, no increased work of breathing,  cough,  Abdomen: normo-active BS + 4 quadrants, soft and non tender MSK: w/c dependent, able to stand for <30 seconds with therapy; hoyer lift for transfers Skin: warm and dry Neuro:  +BLE weakness,  no cognitive impairment Psych: very agitated,  A and O x 2  Thank you for the opportunity to participate in the care of [name].  The palliative care team will continue to follow. Please call our  office at [phone] if we can be of additional assistance.   Goldye Tourangeau Ihor Gully, NP

## 2022-03-10 ENCOUNTER — Non-Acute Institutional Stay: Payer: Medicaid Other | Admitting: Nurse Practitioner

## 2022-03-10 ENCOUNTER — Encounter: Payer: Self-pay | Admitting: Nurse Practitioner

## 2022-03-10 VITALS — BP 120/72 | HR 82 | Temp 97.1°F | Resp 18 | Wt 282.3 lb

## 2022-03-10 DIAGNOSIS — F319 Bipolar disorder, unspecified: Secondary | ICD-10-CM

## 2022-03-10 DIAGNOSIS — Z515 Encounter for palliative care: Secondary | ICD-10-CM

## 2022-03-10 DIAGNOSIS — R5381 Other malaise: Secondary | ICD-10-CM

## 2022-03-10 DIAGNOSIS — R451 Restlessness and agitation: Secondary | ICD-10-CM

## 2022-03-10 DIAGNOSIS — J449 Chronic obstructive pulmonary disease, unspecified: Secondary | ICD-10-CM

## 2022-03-10 DIAGNOSIS — R0602 Shortness of breath: Secondary | ICD-10-CM

## 2022-03-10 NOTE — Progress Notes (Signed)
Designer, jewellery Palliative Care Consult Note Telephone: [phone]  Fax: [phone]    Date of encounter: 03/10/22 4:16 PM PATIENT NAME: ANGELMARIE PONZO [address] [place] [address]   [phone] (home)  DOB: 03-07-1966 MRN: [id number] PRIMARY CARE PROVIDER:    [facility]  RESPONSIBLE PARTY:    Contact Information     Name Relation Home Work Mobile   [place] Spouse [phone]     Mardi Mainland [phone]  [phone]   Saiki,christopher Son   [phone]      I met face to face with patient in facility. Palliative Care was asked to follow this patient by consultation request of  [place] to address advance care planning and complex medical decision making. This is a follow up visit.                                  ASSESSMENT AND PLAN / RECOMMENDATIONS:  Symptom Management/Plan: 1. Advance Care Planning; Full code, full scope of treatment   2. Severe agitation secondary to uncontrolled bipolar management; Ms. Kuba and I talked about previous medications she had been on including cogentin which worked well for her; lithium which caused her to have heart problems. We talked about last stabilized medications were abilify, victoza. We talked about victoza is injectable. Discussed at length about oral vs injection. Ms. Sprowl endorses she would be agreeable to an injection should it help to get her bipolar symptoms controllable. Ms. Mallis and I talked about bipolar, symptoms, delusions, mood swings, agitation, frustration, loss of self independence, coping strategies.    3. Debility secondary to COPD, O2 dependence, chronic respiratory failure, bipolar;    4. Palliative care encounter; Palliative care encounter; Palliative medicine team will continue to support patient, patient's family, and medical team. Visit consisted of counseling and education dealing with the complex and emotionally intense  issues of symptom management and palliative care in the setting of serious and potentially life-threatening illness   6. f/u 2 weeks for ongoing monitoring chronic disease progression, ongoing discussions complex medical decision making Follow up Palliative Care Visit: Palliative care will continue to follow for complex medical decision making, advance care planning, and clarification of goals. Return 2 weeks or prn.   I spent 48 minutes providing this consultation starting at 10:00am. More than 50% of the time in this consultation was spent in counseling and care coordination. PPS: 40% Chief Complaint: Follow up palliative consult for complex medical decision making   HISTORY OF PRESENT ILLNESS:  DEONNA KRUMMEL is a 56 y.o. year old female  with multiple medical problems including COPD, HTN, Chronic respiratory failure, O2 dependent on 3L/[place], CKD, DM, HTN, bipolar. Ms. Cowman currently resides LTC at [facility]. Ms. Mehta requires assistance for mobility, transfers to include hoyer lift to w/c. Ms. Aguado requires total adl's assist with bathing, dressing. Ms. Boston feeds herself with good appetite. No recent falls, wounds, , hospitalizations. Ms. Cupples with recent PNA, where she has returned to wearing continuous O2. I visited and observed Ms. Sedlack. We talked about purpose of pc visit, symptoms, ros, functional overall decline since PNA, requiring to wear O2. We talked about last PC visit when Ms. Leming with severe agitation, cycling bipolar with uncontrolled symptoms. Ms. Sinkler apologized, also acknowledged that her bipolar symptoms, moods, delusions are uncontrolled. We talked about previous medications Ms. Raygoza had taken prescribed by Psychiatry who came to  her home as well as in home primary provider from [facility]. Medication list was obtained and reviewed with Jackey Loge NP; Ms. Nevils and I talked about previous medications she had been on including cogentin  which worked well for her; lithium which caused her to have heart problems. We talked about last stabilized medications were abilify, victoza. We talked about victoza is injectable. Discussed at length about oral vs injection. Ms. Ratto endorses she would be agreeable to an injection should it help to get her bipolar symptoms controllable. Ms. Ditommaso and I talked about bipolar, symptoms, delusions, mood swings, agitation, frustration, loss of self independence, coping strategies. We talked about daily routine, quality of life. We talked about medical goals. We talked about pc role in poc. Therapeutic listening, emotional support provided. Questions answered. Updated Staff and Jackey Loge NP.   History obtained from review of EMR, discussion with facility staff and Ms. Stegner.  I reviewed available labs, medications, imaging, studies and related documents from the EMR.  Records reviewed and summarized above.    ROS 10 point system reviewed all negative except HPI Physical Exam: General: obese, debilitated, very agitated female EYES: lids intact ENMT: oral mucous membranes moist MSK: w/c dependent, able to stand for <30 seconds with therapy; hoyer lift for transfers Skin: warm and dry Neuro:  +BLE weakness,  +cognitive impairment Psych: very agitated,  A and O x 2   Thank you for the opportunity to participate in the care of Ms. Emigh.  The palliative care team will continue to follow. Please call our office at [phone] if we can be of additional assistance.   Jc Veron Ihor Gully, NP

## 2022-05-29 IMAGING — CT CT HEAD W/O CM
3 series · 16 of 47 positions shown, 19 images · non-contrast
Comparison: 02/07/2012

CLINICAL DATA: Fell, pain

EXAM:
CT HEAD WITHOUT CONTRAST
TECHNIQUE: Contiguous axial images were obtained from the base of the skull
through the vertex without intravenous contrast.

[Series 2: head w o · axial · 0.48mm/px · z∈[-33,+117]mm · 10 of 36 slices shown, 13 images]
[im 3/36  brain]
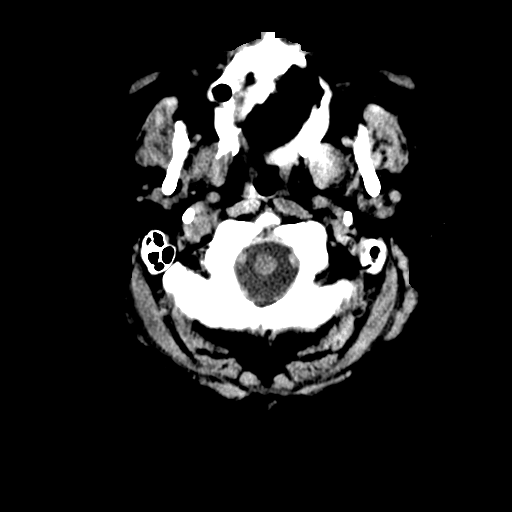
[im 3/36  bone]
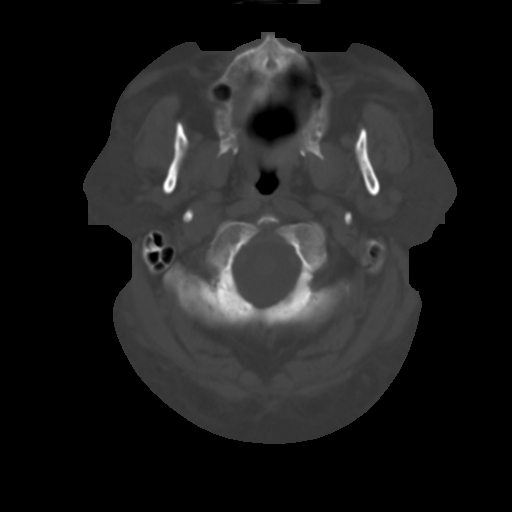
[im 7/36  brain]
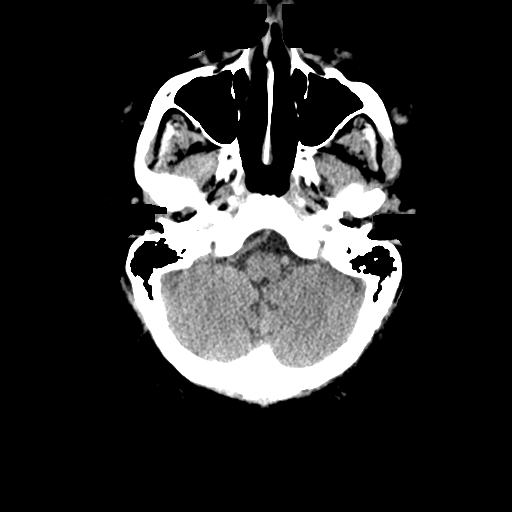
[im 10/36  brain]
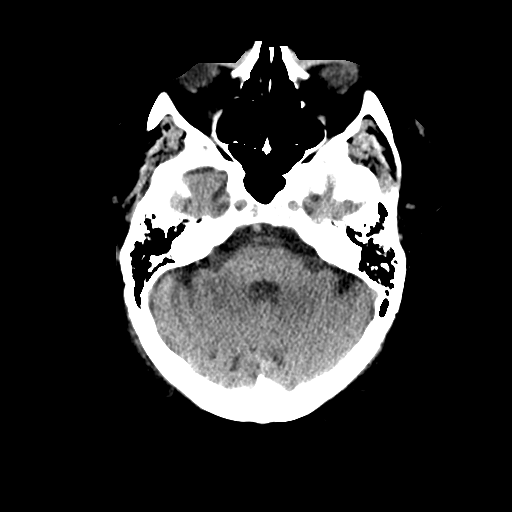
[im 13/36  brain]
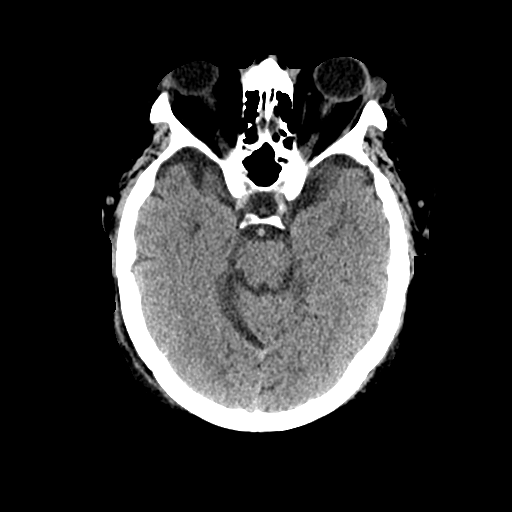
[im 16/36  brain]
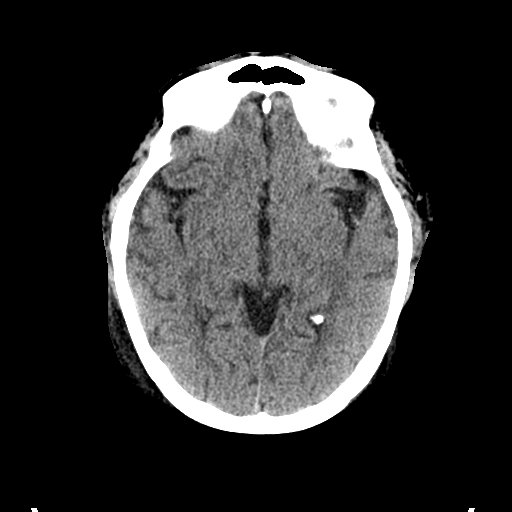
[im 16/36  bone]
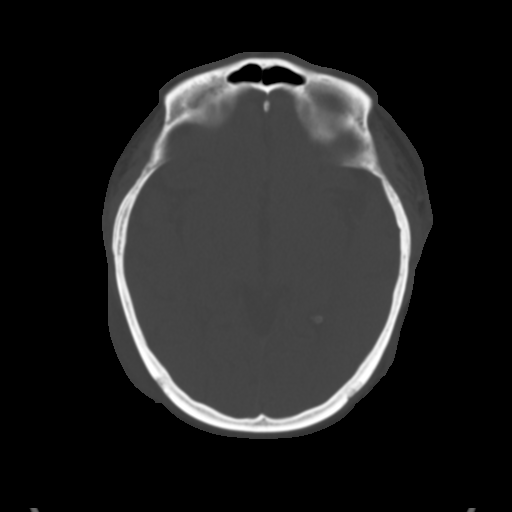
[im 20/36  brain]
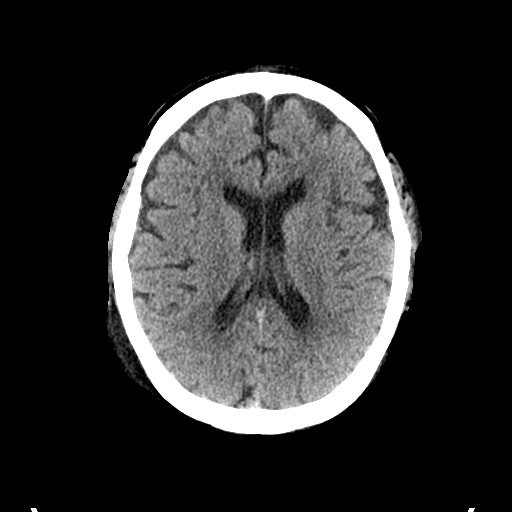
[im 23/36  brain]
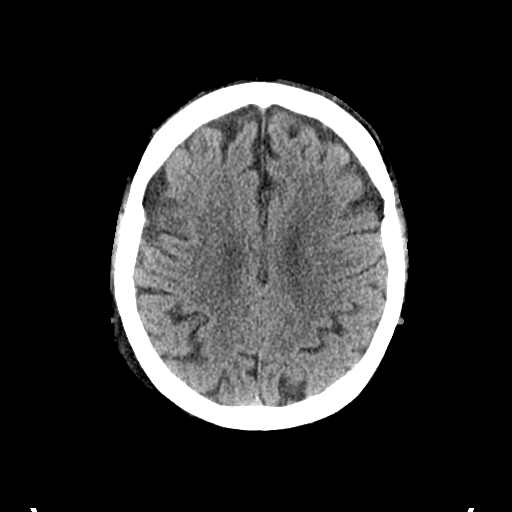
[im 27/36  brain]
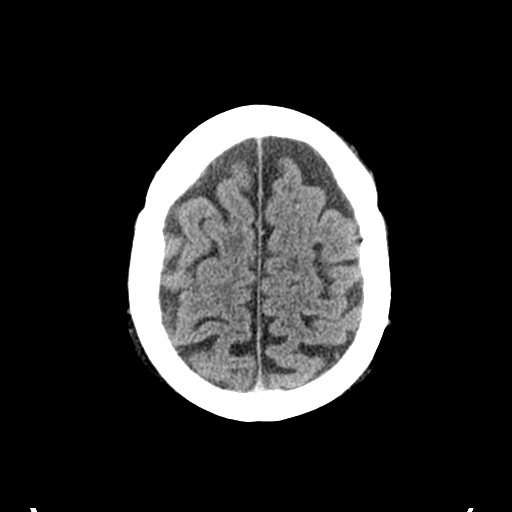
[im 29/36  brain]
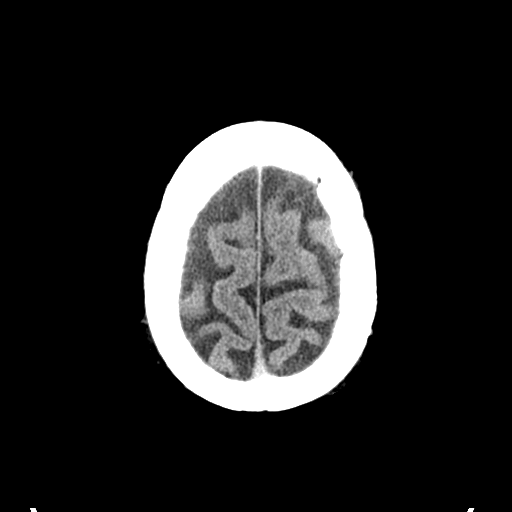
[im 29/36  bone]
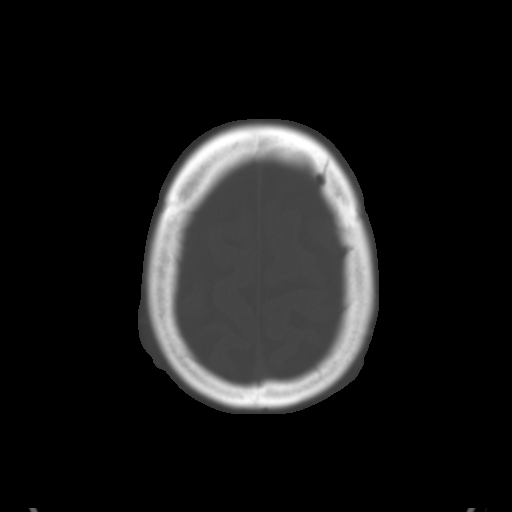
[im 33/36  brain]
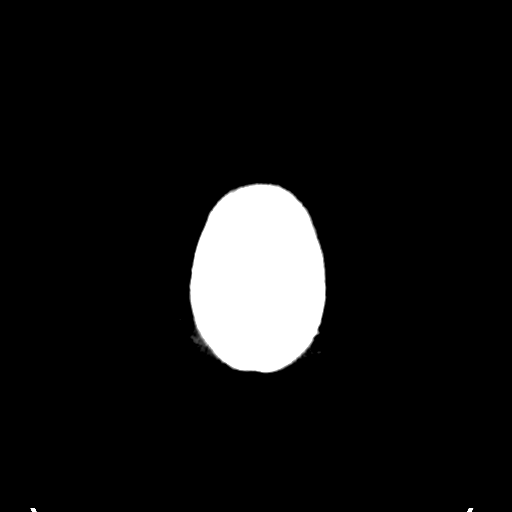

[Series 4: coronal soft · coronal · 0.37mm/px · 3 of 77 slices shown]
[im 26/77  brain]
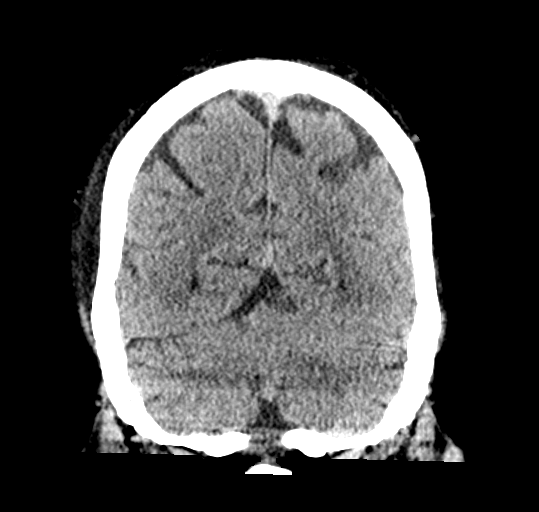
[im 34/77  brain]
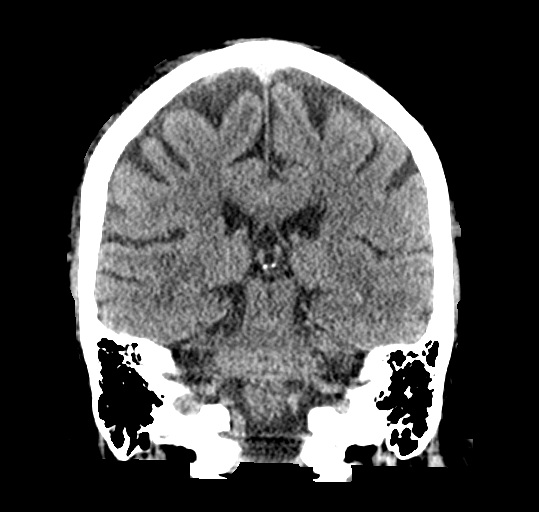
[im 43/77  brain]
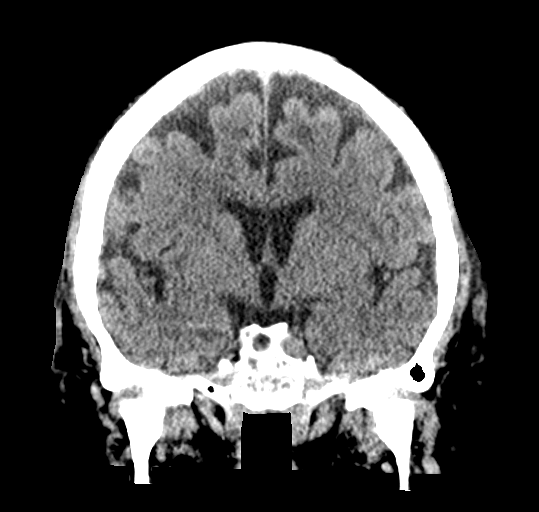

[Series 5: sagittal soft · sagittal · 0.39mm/px · 3 of 67 slices shown]
[im 23/67  brain]
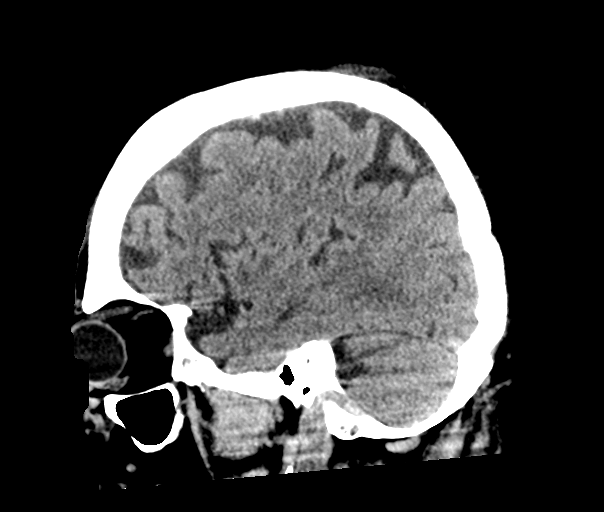
[im 34/67  brain]
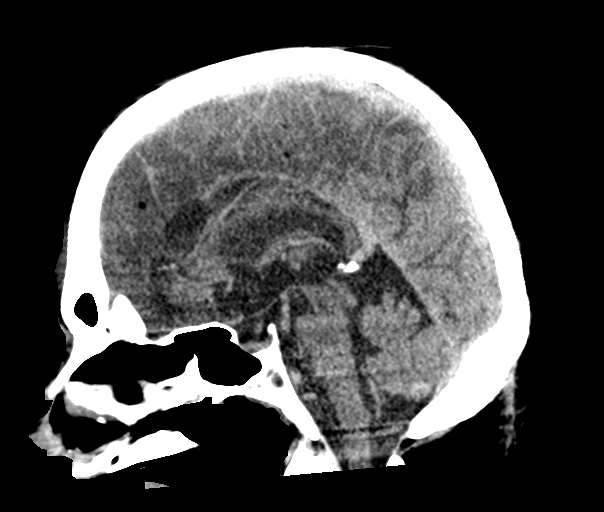
[im 45/67  brain]
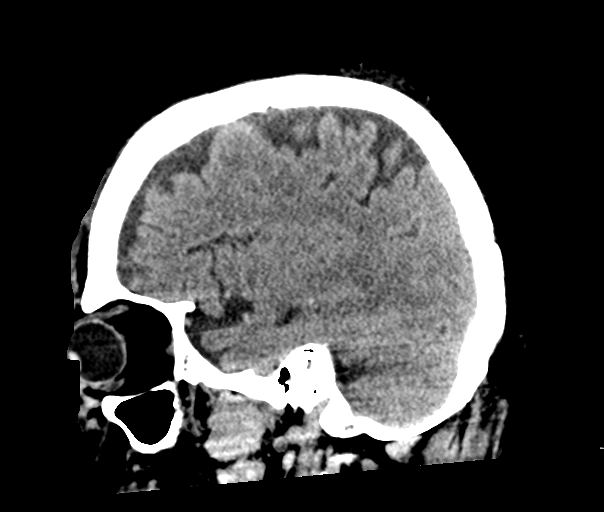

[16 of 47 positions shown; findings below may reference images not displayed]

FINDINGS: Brain: Mild parenchymal atrophy. Patchy areas of hypoattenuation in
deep and periventricular white matter bilaterally. Negative for
acute intracranial hemorrhage, mass lesion, acute infarction,
midline shift, or mass-effect. Acute infarct may be inapparent on
noncontrast CT. Ventricles and sulci symmetric.

Vascular: No hyperdense vessel or unexpected calcification.

Skull: Normal. Negative for fracture or focal lesion.

Sinuses/Orbits: No acute finding.

Other: None
IMPRESSION: 1. Negative for bleed or other acute intracranial process.
2. Atrophy and nonspecific white matter changes.

## 2022-07-08 ENCOUNTER — Emergency Department (HOSPITAL_COMMUNITY): Payer: Medicaid Other

## 2022-07-08 ENCOUNTER — Encounter (HOSPITAL_COMMUNITY): Payer: Self-pay | Admitting: *Deleted

## 2022-07-08 ENCOUNTER — Other Ambulatory Visit: Payer: Self-pay

## 2022-07-08 ENCOUNTER — Emergency Department (HOSPITAL_COMMUNITY)
Admission: EM | Admit: 2022-07-08 | Discharge: 2022-07-08 | Disposition: A | Discharge status: Home / Self Care | Payer: Medicaid Other | Attending: Emergency Medicine | Admitting: Emergency Medicine

## 2022-07-08 DIAGNOSIS — Z7982 Long term (current) use of aspirin: Secondary | ICD-10-CM | POA: Diagnosis not present

## 2022-07-08 DIAGNOSIS — R000 Tachycardia, unspecified: Secondary | ICD-10-CM | POA: Diagnosis not present

## 2022-07-08 DIAGNOSIS — R739 Hyperglycemia, unspecified: Secondary | ICD-10-CM | POA: Diagnosis present

## 2022-07-08 DIAGNOSIS — Z794 Long term (current) use of insulin: Secondary | ICD-10-CM | POA: Diagnosis not present

## 2022-07-08 DIAGNOSIS — Z7984 Long term (current) use of oral hypoglycemic drugs: Secondary | ICD-10-CM | POA: Insufficient documentation

## 2022-07-08 LAB — BASIC METABOLIC PANEL
Anion gap: 11 (ref 5–15)
BUN: 18 mg/dL (ref 6–20)
CO2: 26 mmol/L (ref 22–32)
Calcium: 9.1 mg/dL (ref 8.9–10.3)
Chloride: 98 mmol/L (ref 98–111)
Creatinine, Ser: 1.28 mg/dL — ABNORMAL HIGH (ref 0.44–1.00)
GFR, Estimated: 49 mL/min — ABNORMAL LOW (ref >60)
Glucose, Bld: 490 mg/dL — ABNORMAL HIGH (ref 70–99)
Potassium: 3.4 mmol/L — ABNORMAL LOW (ref 3.5–5.1)
Sodium: 135 mmol/L (ref 135–145)

## 2022-07-08 LAB — CBC
HCT: 41.9 % (ref 36.0–46.0)
Hemoglobin: 13.5 g/dL (ref 12.0–15.0)
MCH: 27.6 pg (ref 26.0–34.0)
MCHC: 32.2 g/dL (ref 30.0–36.0)
MCV: 85.7 fL (ref 80.0–100.0)
Platelets: 264 10*3/uL (ref 150–400)
RBC: 4.89 MIL/uL (ref 3.87–5.11)
RDW: 12.9 % (ref 11.5–15.5)
WBC: 6.2 10*3/uL (ref 4.0–10.5)
nRBC: 0 % (ref 0.0–0.2)

## 2022-07-08 LAB — CBG MONITORING, ED
Glucose-Capillary: 489 mg/dL — ABNORMAL HIGH (ref 70–99)
Glucose-Capillary: 216 mg/dL — ABNORMAL HIGH (ref 70–99)

## 2022-07-08 MED ORDER — PROPRANOLOL HCL 60 MG PO TABS: 60.0000 mg | ORAL_TABLET | Freq: Every day | ORAL | 0 refills | Status: DC

## 2022-07-08 MED ORDER — ISOSORBIDE MONONITRATE ER 30 MG PO TB24: 30.0000 mg | ORAL_TABLET | Freq: Every day | ORAL | 2 refills | Status: DC

## 2022-07-08 MED ORDER — SODIUM CHLORIDE 0.9 % IV BOLUS
1000.0000 mL | Freq: Once | INTRAVENOUS | Status: AC
  Administered 2022-07-08: 1000 mL via INTRAVENOUS

## 2022-07-08 MED ORDER — METFORMIN HCL 500 MG PO TABS: 500.0000 mg | ORAL_TABLET | Freq: Two times a day (BID) | ORAL | 0 refills | Status: DC

## 2022-07-08 MED ORDER — VICTOZA 18 MG/3ML ~~LOC~~ SOPN: 1.8000 mg | PEN_INJECTOR | Freq: Every day | SUBCUTANEOUS | 0 refills | Status: DC

## 2022-07-08 MED ORDER — TRAZODONE HCL 50 MG PO TABS: 50.0000 mg | ORAL_TABLET | Freq: Every day | ORAL | 0 refills | Status: DC

## 2022-07-08 MED ORDER — INSULIN ASPART 100 UNIT/ML IJ SOLN
10.0000 [IU] | Freq: Once | INTRAMUSCULAR | Status: AC
  Administered 2022-07-08: 10 [IU] via SUBCUTANEOUS
  Filled 2022-07-08: qty 1

## 2022-07-08 MED ORDER — INSULIN GLARGINE 100 UNIT/ML ~~LOC~~ SOLN: 45.0000 [IU] | Freq: Two times a day (BID) | SUBCUTANEOUS | 2 refills | Status: DC

## 2022-07-08 MED ORDER — IPRATROPIUM-ALBUTEROL 0.5-2.5 (3) MG/3ML IN SOLN: 3.0000 mL | RESPIRATORY_TRACT | 0 refills | Status: DC | PRN

## 2022-07-08 MED ORDER — BUDESONIDE 0.25 MG/2ML IN SUSP: 0.2500 mg | Freq: Two times a day (BID) | RESPIRATORY_TRACT | 12 refills | Status: DC

## 2022-07-08 MED ORDER — IPRATROPIUM-ALBUTEROL 0.5-2.5 (3) MG/3ML IN SOLN
3.0000 mL | Freq: Once | RESPIRATORY_TRACT | Status: AC
  Administered 2022-07-08: 3 mL via RESPIRATORY_TRACT
  Filled 2022-07-08: qty 3

## 2022-07-08 MED ORDER — PRAVASTATIN SODIUM 40 MG PO TABS: 40.0000 mg | ORAL_TABLET | Freq: Every day | ORAL | 0 refills | Status: DC

## 2022-07-08 MED ORDER — PROZAC 20 MG PO CAPS: 20.0000 mg | ORAL_CAPSULE | Freq: Every day | ORAL | 0 refills | Status: DC

## 2022-07-08 NOTE — Discharge Instructions (Signed)
You were seen in the emergency department for being elevated and your blood sugars after being off your medications.  We do not have a current medication list for you so we are restarting medications that you were on back in April.  This may not be complete list so please review with your primary care doctor.  Return to the emergency department if any worsening or concerning symptoms.

## 2022-07-08 NOTE — ED Triage Notes (Signed)
Family reports pt with hyperglycemia, cbg 512 with EMS.  Pt had refused EMS transportation.

## 2022-07-08 NOTE — ED Provider Notes (Signed)
[facility] EMERGENCY DEPARTMENT Provider Note   CSN: [id number] Arrival date & time: 07/08/22  1947     History {Add pertinent medical, surgical, social history, OB history to HPI:1} Chief Complaint  Patient presents with   Hyperglycemia    [name] is a 56 y.o. female.  She is brought in by her family for evaluation of elevated blood sugar.  Son is giving most of the history.  He says that the patient signed out of her nursing facility a few weeks ago AMA.  She is followed up with [name] but they are still waiting on the list of her medications so they can restart them.  She has not had any of her medications in 2 weeks.  She does not have any specific complaints.  Family relates that she has been drinking a lot of sodas and her blood sugars have been in the 400s.  Normally her blood sugars are closer to the 100s.  The history is provided by the patient and a relative.  Hyperglycemia Severity:  Moderate Onset quality:  Gradual Duration:  2 weeks Timing:  Constant Progression:  Worsening Chronicity:  Recurrent Diabetes status:  Controlled with insulin Context: noncompliance   Relieved by:  None tried Ineffective treatments:  None tried Associated symptoms: increased thirst   Associated symptoms: no abdominal pain, no chest pain, no dysuria, no fever and no shortness of breath        Home Medications Prior to Admission medications   Medication Sig Start Date End Date Taking? Authorizing Provider  ACETAMINOPHEN EXTRA STRENGTH 500 MG tablet Take 1,000 mg by mouth every 6 (six) hours as needed. 11/02/21   [provider]  aspirin EC 81 MG tablet Take 81 mg by mouth every morning.      [provider]  Biotin 10 MG CAPS Take 1 capsule by mouth daily.    [provider]  budesonide (PULMICORT) 0.25 MG/2ML nebulizer solution Take 2 mLs (0.25 mg total) by nebulization 2 (two) times daily. 04/29/21   Kathie Dike, MD   Cholecalciferol (VITAMIN D3) 50 MCG (2000 UT) TABS Take 1 tablet by mouth daily.    [provider]  docusate sodium (COLACE) 100 MG capsule Take 100 mg by mouth 3 (three) times daily.      [provider]  insulin aspart (NOVOLOG) 100 UNIT/ML injection CBG < 70: implement hypoglycemia protocol CBG 70 - 120: 0 units CBG 121 - 150: 3 units CBG 151 - 200: 4 units CBG 201 - 250: 7 units CBG 251 - 300: 11 units CBG 301 - 350: 15 units CBG 351 - 400: 20 units 04/20/14   Hosie Poisson, MD  insulin glargine (LANTUS) 100 UNIT/ML injection Inject 0.45 mLs (45 Units total) into the skin 2 (two) times daily. 04/29/21   Kathie Dike, MD  ipratropium-albuterol (DUONEB) 0.5-2.5 (3) MG/3ML SOLN Take 3 mLs by nebulization every 4 (four) hours as needed. 04/29/21   Kathie Dike, MD  isosorbide mononitrate (IMDUR) 30 MG 24 hr tablet Take 30 mg by mouth daily.    [provider]  liraglutide (VICTOZA) 18 MG/3ML SOPN Inject 1.8 mg into the skin daily.    [provider]  loratadine (CLARITIN) 10 MG tablet Take 10 mg by mouth daily.    [provider]  metFORMIN (GLUCOPHAGE) 500 MG tablet Take by mouth 2 (two) times daily with a meal.    [provider]  nystatin (MYCOSTATIN/NYSTOP) powder Apply topically 2 (two) times  daily. 04/29/21   Kathie Dike, MD  oxybutynin (DITROPAN) 5 MG tablet Take 5 mg by mouth 3 (three) times daily.    [provider]  pravastatin (PRAVACHOL) 40 MG tablet Take 40 mg by mouth daily.    [provider]  propranolol (INDERAL) 60 MG tablet Take 60 mg by mouth daily. On an empty stomach    [provider]  PROZAC 20 MG capsule Take 20 mg by mouth daily. 09/29/21   [provider]  traZODone (DESYREL) 50 MG tablet Take 50 mg by mouth at bedtime.    [provider]      Allergies    Bee venom    Review of Systems   Review of Systems  Constitutional:  Negative for fever.  Respiratory:   Negative for shortness of breath.   Cardiovascular:  Negative for chest pain.  Gastrointestinal:  Negative for abdominal pain.  Endocrine: Positive for polydipsia.  Genitourinary:  Positive for frequency. Negative for dysuria.    Physical Exam Updated Vital Signs BP (!) 139/94   Pulse (!) 111   Temp 99.6 F (37.6 C) (Oral)   Resp 18   Ht 5\' 6"  (1.676 m)   Wt 95.3 kg   SpO2 (!) 89%   BMI 33.89 kg/m  Physical Exam Vitals and nursing note reviewed.  Constitutional:      General: She is not in acute distress.    Appearance: Normal appearance. She is well-developed. She is obese.  HENT:     Head: Normocephalic and atraumatic.  Eyes:     Conjunctiva/sclera: Conjunctivae normal.  Cardiovascular:     Rate and Rhythm: Regular rhythm. Tachycardia present.     Heart sounds: No murmur heard. Pulmonary:     Effort: Pulmonary effort is normal. No respiratory distress.     Breath sounds: Normal breath sounds.  Abdominal:     Palpations: Abdomen is soft.     Tenderness: There is no abdominal tenderness. There is no guarding or rebound.  Musculoskeletal:        General: Normal range of motion.     Cervical back: Neck supple.  Skin:    General: Skin is warm and dry.     Capillary Refill: Capillary refill takes less than 2 seconds.  Neurological:     General: No focal deficit present.     Mental Status: She is alert.     ED Results / Procedures / Treatments   Labs (all labs ordered are listed, but only abnormal results are displayed) Labs Reviewed  BASIC METABOLIC PANEL - Abnormal; Notable for the following components:      Result Value   Potassium 3.4 (*)    Glucose, Bld 490 (*)    Creatinine, Ser 1.28 (*)    GFR, Estimated 49 (*)    All other components within normal limits  CBG MONITORING, ED - Abnormal; Notable for the following components:   Glucose-Capillary 489 (*)    All other components within normal limits  CBC  URINALYSIS, ROUTINE W REFLEX MICROSCOPIC  CBG  MONITORING, ED    EKG None  Radiology No results found.  Procedures Procedures  {Document cardiac monitor, telemetry assessment procedure when appropriate:1}  Medications Ordered in ED Medications  sodium chloride 0.9 % bolus 1,000 mL (has no administration in time range)  insulin aspart (novoLOG) injection 10 Units (has no administration in time range)    ED Course/ Medical Decision Making/ A&P  Medical Decision Making Amount and/or Complexity of Data Reviewed Labs: ordered.  Risk Prescription drug management.   This patient complains of ***; this involves an extensive number of treatment Options and is a complaint that carries with it a high risk of complications and morbidity. The differential includes ***  I ordered, reviewed and interpreted labs, which included *** I ordered medication *** and reviewed PMP when indicated. I ordered imaging studies which included *** and I independently    visualized and interpreted imaging which showed *** Additional history obtained from *** Previous records obtained and reviewed *** I consulted *** and discussed lab and imaging findings and discussed disposition.  Cardiac monitoring reviewed, *** Social determinants considered, *** Critical Interventions: ***  After the interventions stated above, I reevaluated the patient and found *** Admission and further testing considered, ***   {Document critical care time when appropriate:1} {Document review of labs and clinical decision tools ie heart score, Chads2Vasc2 etc:1}  {Document your independent review of radiology images, and any outside records:1} {Document your discussion with family members, caretakers, and with consultants:1} {Document social determinants of health affecting pt's care:1} {Document your decision making why or why not admission, treatments were needed:1} Final Clinical Impression(s) / ED Diagnoses Final diagnoses:  None     Rx / DC Orders ED Discharge Orders     None

## 2022-07-27 ENCOUNTER — Other Ambulatory Visit: Payer: Self-pay

## 2022-07-27 ENCOUNTER — Emergency Department
Admission: EM | Admit: 2022-07-27 | Discharge: 2022-07-28 | Disposition: A | Discharge status: Home / Self Care | Payer: No Typology Code available for payment source | Attending: Emergency Medicine | Admitting: Emergency Medicine

## 2022-07-27 DIAGNOSIS — I10 Essential (primary) hypertension: Secondary | ICD-10-CM | POA: Diagnosis not present

## 2022-07-27 DIAGNOSIS — R4182 Altered mental status, unspecified: Secondary | ICD-10-CM | POA: Insufficient documentation

## 2022-07-27 DIAGNOSIS — E119 Type 2 diabetes mellitus without complications: Secondary | ICD-10-CM | POA: Diagnosis not present

## 2022-07-27 DIAGNOSIS — N3000 Acute cystitis without hematuria: Secondary | ICD-10-CM

## 2022-07-27 DIAGNOSIS — G20C Parkinsonism, unspecified: Secondary | ICD-10-CM | POA: Diagnosis not present

## 2022-07-27 DIAGNOSIS — R451 Restlessness and agitation: Secondary | ICD-10-CM | POA: Insufficient documentation

## 2022-07-27 DIAGNOSIS — Z20822 Contact with and (suspected) exposure to covid-19: Secondary | ICD-10-CM | POA: Diagnosis not present

## 2022-07-27 DIAGNOSIS — J449 Chronic obstructive pulmonary disease, unspecified: Secondary | ICD-10-CM | POA: Insufficient documentation

## 2022-07-27 DIAGNOSIS — F23 Brief psychotic disorder: Secondary | ICD-10-CM

## 2022-07-27 DIAGNOSIS — Z046 Encounter for general psychiatric examination, requested by authority: Secondary | ICD-10-CM | POA: Diagnosis present

## 2022-07-27 LAB — COMPREHENSIVE METABOLIC PANEL
ALT: 19 U/L (ref 0–44)
AST: 23 U/L (ref 15–41)
Albumin: 3.6 g/dL (ref 3.5–5.0)
Alkaline Phosphatase: 111 U/L (ref 38–126)
Anion gap: 9 (ref 5–15)
BUN: 27 mg/dL — ABNORMAL HIGH (ref 6–20)
CO2: 25 mmol/L (ref 22–32)
Calcium: 9.3 mg/dL (ref 8.9–10.3)
Chloride: 107 mmol/L (ref 98–111)
Creatinine, Ser: 1.1 mg/dL — ABNORMAL HIGH (ref 0.44–1.00)
GFR, Estimated: 59 mL/min — ABNORMAL LOW (ref >60)
Glucose, Bld: 77 mg/dL (ref 70–99)
Potassium: 3.7 mmol/L (ref 3.5–5.1)
Sodium: 141 mmol/L (ref 135–145)
Total Bilirubin: 0.7 mg/dL (ref 0.3–1.2)
Total Protein: 7.2 g/dL (ref 6.5–8.1)

## 2022-07-27 LAB — SALICYLATE LEVEL: Salicylate Lvl: <7 mg/dL — ABNORMAL LOW (ref 7.0–30.0)

## 2022-07-27 LAB — CBC
HCT: 44.2 % (ref 36.0–46.0)
Hemoglobin: 14 g/dL (ref 12.0–15.0)
MCH: 27 pg (ref 26.0–34.0)
MCHC: 31.7 g/dL (ref 30.0–36.0)
MCV: 85.3 fL (ref 80.0–100.0)
Platelets: 295 10*3/uL (ref 150–400)
RBC: 5.18 MIL/uL — ABNORMAL HIGH (ref 3.87–5.11)
RDW: 12.8 % (ref 11.5–15.5)
WBC: 10.5 10*3/uL (ref 4.0–10.5)
nRBC: 0 % (ref 0.0–0.2)

## 2022-07-27 LAB — ACETAMINOPHEN LEVEL: Acetaminophen (Tylenol), Serum: <10 ug/mL — ABNORMAL LOW (ref 10–30)

## 2022-07-27 LAB — ETHANOL: Alcohol, Ethyl (B): <10 mg/dL (ref <10)

## 2022-07-27 MED ORDER — ZIPRASIDONE MESYLATE 20 MG IM SOLR
20.0000 mg | Freq: Once | INTRAMUSCULAR | Status: AC
  Administered 2022-07-27: 20 mg via INTRAMUSCULAR
  Filled 2022-07-27: qty 20

## 2022-07-27 NOTE — ED Triage Notes (Addendum)
Pt coming from home via [facility]. Per EMS, pt was "freaking out" but were unable to understand why. Pt was combative at home but was able to be verbally de-escalated. Pt has psych hx, but EMS unsure if compliant with meds.   During triage, pt yelling out incoherently.

## 2022-07-27 NOTE — ED Provider Notes (Signed)
[facility] Provider Note    Event Date/Time   First MD Initiated Contact with Patient 07/27/22 2213     (approximate)  History   Chief Complaint: Psychiatric Evaluation  HPI  [name] is a 56 y.o. female with a past medical history of anxiety, COPD, diabetes, hypertension, Parkinson's, seizures, presents to the emergency department by EMS for "freaking out."  According to the patient she was "freaking out" and the police thought "I was crazy."  Here the patient is yelling loudly at times.  She is not physically threatening but again yelling loudly.  Patient is unable to give much of a history mostly because she says she does not speak [place] but she clearly is speaking [place].  At times she will answer questions appropriately but most of the time she refuses and states "you are going to think I am crazy."  Patient states she is willing to take medication voluntarily to help her calm down or relax.  Patient denies any alcohol or drug use.  Physical Exam   Triage Vital Signs: ED Triage Vitals [07/27/22 2211]  Enc Vitals Group     BP (!) 140/105     Pulse Rate 90     Resp 20     Temp 98.2 F (36.8 C)     Temp Source Oral     SpO2 94 %     Weight 212 lb 4.9 oz (96.3 kg)     Height 5\' 6"  (1.676 m)     Head Circumference      Peak Flow      Pain Score 0     Pain Loc      Pain Edu?      Excl. in [place]?     Most recent vital signs: Vitals:   07/27/22 2211  BP: (!) 140/105  Pulse: 90  Resp: 20  Temp: 98.2 F (36.8 C)  SpO2: 94%    General: Awake, no distress.  CV:  Good peripheral perfusion.  Regular rate and rhythm  Resp:  Normal effort.  Equal breath sounds bilaterally.  Abd:  No distention.  Soft, nontender.  No rebound or guarding.   ED Results / Procedures / Treatments    MEDICATIONS ORDERED IN ED: Medications  ziprasidone (GEODON) injection 20 mg (20 mg Intramuscular Given 07/27/22 2220)     IMPRESSION / MDM / ASSESSMENT AND  PLAN / ED COURSE  I reviewed the triage vital signs and the nursing notes.  Patient's presentation is most consistent with acute presentation with potential threat to life or bodily function.  Patient presents emergency department for altered mental status, she is agitated yelling loudly at times.  Patient is not physically threatening and for the most part is redirectable verbally.  Patient is very worked up agitated and is willing to take medication to help her calm down and relax.  We will dose 20 mg of IM Geodon.  We will check labs.  Given the patient's acute altered state and agitation I do not believe the patient is able to safely care for herself in her current state.  We will place the patient under an IVC and have psychiatry and TTS evaluate.  We will check labs and continue to closely monitor.  Patient's lab work is largely nonrevealing with a normal CBC, reassuring chemistry.  Normal ethanol negative acetaminophen and salicylate levels.  Psychiatric evaluation pending.  FINAL CLINICAL IMPRESSION(S) / ED DIAGNOSES   Acute agitation Altered mental status  Note:  This document was prepared using Dragon voice recognition software and may include unintentional dictation errors.   Harvest Dark, MD 07/27/22 2302

## 2022-07-27 NOTE — BH Assessment (Addendum)
This Probation officer spoke with pt's daughter  [name] [phone]) for collateral. Pt's daughter reported that EMS was called due to pt not breathing and being unresponsive. Pt's daughter explained that the pt became combative with EMS. Pt's daughter explained that the pt has not been sleeping well at night and has had an altered mental status. Pt's daughter expressed a need for the the pt to be helped as she was released from [facility] a few weeks ago and is unsure of whether the pt's medications may have been adjusted.  This writer attempted to reach pt's daughter in law of the home [name] [phone]); however there was no answer/ability to leave a voicemail. This Probation officer sent an [employer] text message with a call back number.

## 2022-07-27 NOTE — ED Notes (Signed)
Pt given sandwich tray and water 

## 2022-07-27 NOTE — ED Notes (Signed)
Pt dressed out into burgundy scrubs by this RN and Beth NT. Belongings placed in belongings bag [name] [name] [name] Pearlean Brownie
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
## 2022-07-28 ENCOUNTER — Emergency Department: Payer: No Typology Code available for payment source

## 2022-07-28 ENCOUNTER — Encounter: Payer: Self-pay | Admitting: Psychiatry

## 2022-07-28 DIAGNOSIS — R451 Restlessness and agitation: Secondary | ICD-10-CM

## 2022-07-28 HISTORY — DX: Restlessness and agitation: R45.1

## 2022-07-28 LAB — URINALYSIS, ROUTINE W REFLEX MICROSCOPIC
Bilirubin Urine: NEGATIVE
Glucose, UA: NEGATIVE mg/dL
Hgb urine dipstick: NEGATIVE
Ketones, ur: NEGATIVE mg/dL
Nitrite: POSITIVE — AB
Protein, ur: >=300 mg/dL — AB
Specific Gravity, Urine: 1.015 (ref 1.005–1.030)
WBC, UA: >50 WBC/hpf — ABNORMAL HIGH (ref 0–5)
pH: 5 (ref 5.0–8.0)

## 2022-07-28 LAB — URINE DRUG SCREEN, QUALITATIVE (ARMC ONLY)
Amphetamines, Ur Screen: NOT DETECTED
Barbiturates, Ur Screen: NOT DETECTED
Benzodiazepine, Ur Scrn: POSITIVE — AB
Cannabinoid 50 Ng, Ur ~~LOC~~: NOT DETECTED
Cocaine Metabolite,Ur ~~LOC~~: NOT DETECTED
MDMA (Ecstasy)Ur Screen: NOT DETECTED
Methadone Scn, Ur: NOT DETECTED
Opiate, Ur Screen: NOT DETECTED
Phencyclidine (PCP) Ur S: NOT DETECTED
Tricyclic, Ur Screen: NOT DETECTED

## 2022-07-28 LAB — HEMOGLOBIN A1C
Hgb A1c MFr Bld: 11.7 % — ABNORMAL HIGH (ref 4.8–5.6)
Mean Plasma Glucose: 289.09 mg/dL

## 2022-07-28 LAB — CBG MONITORING, ED
Glucose-Capillary: 60 mg/dL — ABNORMAL LOW (ref 70–99)
Glucose-Capillary: 190 mg/dL — ABNORMAL HIGH (ref 70–99)

## 2022-07-28 LAB — RESP PANEL BY RT-PCR (FLU A&B, COVID) ARPGX2
Influenza A by PCR: NEGATIVE
Influenza B by PCR: NEGATIVE
SARS Coronavirus 2 by RT PCR: NEGATIVE

## 2022-07-28 MED ORDER — INSULIN GLARGINE-YFGN 100 UNIT/ML ~~LOC~~ SOLN: 45.0000 [IU] | Freq: Two times a day (BID) | SUBCUTANEOUS | Status: DC

## 2022-07-28 MED ORDER — METFORMIN HCL 500 MG PO TABS: 500.0000 mg | ORAL_TABLET | Freq: Two times a day (BID) | ORAL | Status: DC

## 2022-07-28 MED ORDER — LIRAGLUTIDE 18 MG/3ML ~~LOC~~ SOPN: 1.8000 mg | PEN_INJECTOR | Freq: Every day | SUBCUTANEOUS | Status: DC

## 2022-07-28 MED ORDER — PRAVASTATIN SODIUM 20 MG PO TABS
40.0000 mg | ORAL_TABLET | Freq: Every day | ORAL | Status: DC
  Administered 2022-07-28: 40 mg via ORAL
  Filled 2022-07-28: qty 2

## 2022-07-28 MED ORDER — ISOSORBIDE MONONITRATE ER 60 MG PO TB24
30.0000 mg | ORAL_TABLET | Freq: Every day | ORAL | Status: DC
  Administered 2022-07-28: 30 mg via ORAL
  Filled 2022-07-28: qty 1

## 2022-07-28 MED ORDER — CEPHALEXIN 500 MG PO CAPS: 500.0000 mg | ORAL_CAPSULE | Freq: Four times a day (QID) | ORAL | 0 refills | Status: AC

## 2022-07-28 MED ORDER — INSULIN ASPART 100 UNIT/ML IJ SOLN: 0.0000 [IU] | Freq: Three times a day (TID) | INTRAMUSCULAR | Status: DC

## 2022-07-28 MED ORDER — LORAZEPAM 2 MG PO TABS
2.0000 mg | ORAL_TABLET | Freq: Once | ORAL | Status: AC
  Administered 2022-07-28: 2 mg via ORAL
  Filled 2022-07-28: qty 1

## 2022-07-28 MED ORDER — INSULIN ASPART 100 UNIT/ML IJ SOLN: 0.0000 [IU] | Freq: Every day | INTRAMUSCULAR | Status: DC

## 2022-07-28 MED ORDER — CEPHALEXIN 500 MG PO CAPS
500.0000 mg | ORAL_CAPSULE | Freq: Once | ORAL | Status: AC
  Administered 2022-07-28: 500 mg via ORAL
  Filled 2022-07-28: qty 1

## 2022-07-28 MED ORDER — PROPRANOLOL HCL 20 MG PO TABS
60.0000 mg | ORAL_TABLET | Freq: Every day | ORAL | Status: DC
  Administered 2022-07-28: 60 mg via ORAL
  Filled 2022-07-28: qty 3

## 2022-07-28 NOTE — ED Notes (Signed)
IVC rescinded per NP Lord  

## 2022-07-28 NOTE — ED Notes (Signed)
[place] at bedside to speak with daughter

## 2022-07-28 NOTE — Consult Note (Signed)
[facility] Consult   Reason for Consult: Agitation/"mania" Referring Physician: EDP Patient Identification: [name] MRN:  [id number] Principal Diagnosis: Agitation Diagnosis:  Principal Problem:   Agitation   Total Time spent with patient: 45 minutes  Subjective:   [name] is a 56 y.o. female patient admitted with medication noncompliance and "manic behaviors".  HPI: Patient presented to the ED via EMS.  Patient had been discharged from [facility] a few weeks previous to presentation.  According to notes, patient has been staying up all night and acting erratically, making false accusations.  On evaluation, patient is cooperative and pleasant.  She is difficult to understand, does not have any teeth, no dentures.  Speech is somewhat pressured.  She states that "I am not going back to  health care."  She adamantly denies any suicidal or homicidal intent.  Denies auditory or visual hallucinations or paranoia.  She does not present with any paranoid delusions.  Unclear if she is taking any of her medications.  According to the chart, patient has bipolar disorder, however, the only psychotropic medications on her list are trazodone and Prozac.  Patient is stating that she wants to return home.  Collateral from her family will need to be obtained.  [id number]: Attempted to reach husband, Orianna Biskup,  Left HIPAA COMPLAINT MESSAGE   Collateral from daughter, Margreta Journey. She has "diabetes and mental problems."  Altha Harm is states that she has had "anger issues" for many years.  The provider at [facility] is going to see her next week and adjust her psychiatric medication. It was at [facility] that the provider said patient "might have bipolar disorder."  Margreta Journey states that she only gets confused at night.  She does not sleep at night.  She sleeps during the day.  Margreta Journey states that she called EMS because the patient was not  responding.  They took her out of [facility] a few weeks ago.  They did not call EMS for a mental health problem; Margreta Journey says it was because she couldn't get patient to respond to her.   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 Family Psychiatric  History:  Social History:  Social History   Substance and Sexual Activity  Alcohol Use No     Social History   Substance and Sexual Activity  Drug Use No    Social History   Socioeconomic History   Marital status: Married    Spouse name: Not on  file   Number of children: Not on file   Years of education: Not on file   Highest education level: Not on file  Occupational History   Not on file  Tobacco Use   Smoking status: Never   Smokeless tobacco: Never  Substance and Sexual Activity   Alcohol use: No   Drug use: No   Sexual activity: Yes    Birth control/protection: Surgical  Other Topics Concern   Not on file  Social History Narrative   Not on file   Social Determinants of Health   Financial Resource Strain: Not on file  Food  Insecurity: Not on file  Transportation Needs: Not on file  Physical Activity: Not on file  Stress: Not on file  Social Connections: Not on file   Additional Social History:    Allergies:   Allergies  Allergen Reactions   Bee Venom Hives    Labs:  Results for orders placed or performed during the hospital encounter of 07/27/22 (from the past 48 hour(s))  CBC     Status: Abnormal   Collection Time: 07/27/22 10:17 PM  Result Value Ref Range   WBC 10.5 4.0 - 10.5 K/uL   RBC 5.18 (H) 3.87 - 5.11 MIL/uL   Hemoglobin 14.0 12.0 - 15.0 g/dL   HCT 81.4 48.1 - 85.6 %   MCV 85.3 80.0 - 100.0 fL   MCH 27.0 26.0 - 34.0 pg   MCHC 31.7 30.0 - 36.0 g/dL   RDW 31.4 97.0 - 26.3 %   Platelets 295 150 - 400 K/uL   nRBC 0.0 0.0 - 0.2 %    Comment: Performed at [facility], [address] [place], [place] [address]  Comprehensive metabolic panel     Status: Abnormal   Collection Time: 07/27/22 10:17 PM  Result Value Ref Range   Sodium 141 135 - 145 mmol/L   Potassium 3.7 3.5 - 5.1 mmol/L   Chloride 107 98 - 111 mmol/L   CO2 25 22 - 32 mmol/L   Glucose, Bld 77 70 - 99 mg/dL    Comment: Glucose reference range applies only to samples taken after fasting for at least 8 hours.   BUN 27 (H) 6 - 20 mg/dL   Creatinine, Ser 5.02 (H) 0.44 - 1.00 mg/dL   Calcium 9.3 8.9 - 77.4 mg/dL   Total Protein 7.2 6.5 - 8.1 g/dL   Albumin 3.6 3.5 - 5.0 g/dL   AST 23 15 - 41 U/L   ALT 19 0 - 44 U/L   Alkaline Phosphatase 111 38 - 126 U/L   Total Bilirubin 0.7 0.3 - 1.2 mg/dL   GFR, Estimated 59 (L) >60 mL/min    Comment: (NOTE) Calculated using the CKD-EPI Creatinine Equation (2021)    Anion gap 9 5 - 15    Comment: Performed at [facility], [address] [place], [place] [address]  Ethanol     Status: None   Collection Time: 07/27/22 10:17 PM  Result Value Ref Range   Alcohol, Ethyl (B) <10 <10 mg/dL    Comment: (NOTE) Lowest detectable limit for serum alcohol is 10  mg/dL.  For medical purposes only. Performed at [facility], [address], [place], [place] [address]   Acetaminophen level     Status: Abnormal   Collection Time: 07/27/22 10:17 PM  Result Value Ref Range   Acetaminophen (Tylenol), Serum <10 (L) 10 - 30 ug/mL    Comment: (NOTE) Therapeutic concentrations vary significantly.  A range of 10-30 ug/mL  may be an effective concentration for many patients. However, some  are best treated at concentrations outside of this range. Acetaminophen concentrations >150 ug/mL at 4 hours after ingestion  and >50 ug/mL at 12 hours after ingestion are often associated with  toxic reactions.  Performed at [facility], [address] [place], [place] [address]   Salicylate level     Status: Abnormal   Collection Time: 07/27/22 10:17 PM  Result Value Ref Range   Salicylate Lvl <7.0 (L) 7.0 - 30.0 mg/dL    Comment: Performed at [facility], [address] [place], [place] [address]  Resp Panel by RT-PCR (Flu A&B, Covid) Anterior Nasal Swab     Status: None   Collection Time: 07/27/22 11:49 PM   Specimen: Anterior Nasal Swab  Result Value Ref Range   SARS Coronavirus 2 by RT PCR NEGATIVE NEGATIVE    Comment: (NOTE) SARS-CoV-2 target nucleic acids are NOT DETECTED.  The SARS-CoV-2 RNA is generally detectable in upper respiratory specimens during the acute phase of infection. The lowest concentration of SARS-CoV-2 viral copies this assay can detect is 138 copies/mL. A negative result does not preclude SARS-Cov-2 infection and should not be used as the sole basis for treatment or other patient management decisions. A negative result may occur with  improper specimen collection/handling, submission of specimen other than nasopharyngeal swab, presence of viral mutation(s) within the areas targeted by this assay, and inadequate number of viral copies(<138 copies/mL). A negative result must be combined with clinical  observations, patient history, and epidemiological information. The expected result is Negative.  Fact Sheet for Patients:  BloggerCourse.comhttps://www.fda.gov/media/152166/download  Fact Sheet for Healthcare Providers:  SeriousBroker.ithttps://www.fda.gov/media/152162/download  This test is no t yet approved or cleared by the [place] FDA and  has been authorized for detection and/or diagnosis of SARS-CoV-2 by FDA under an Emergency Use Authorization (EUA). This EUA will remain  in effect (meaning this test can be used) for the duration of the COVID-19 declaration under Section 564(b)(1) of the Act, 21 U.S.C.section 360bbb-3(b)(1), unless the authorization is terminated  or revoked sooner.       Influenza A by PCR NEGATIVE NEGATIVE   Influenza B by PCR NEGATIVE NEGATIVE    Comment: (NOTE) The Xpert Xpress SARS-CoV-2/FLU/RSV plus assay is intended as an aid in the diagnosis of influenza from Nasopharyngeal swab specimens and should not be used as a sole basis for treatment. Nasal washings and aspirates are unacceptable for Xpert Xpress SARS-CoV-2/FLU/RSV testing.  Fact Sheet for Patients: BloggerCourse.comhttps://www.fda.gov/media/152166/download  Fact Sheet for Healthcare Providers: SeriousBroker.ithttps://www.fda.gov/media/152162/download  This test is not yet approved or cleared by the [place] FDA and has been authorized for detection and/or diagnosis of SARS-CoV-2 by FDA under an Emergency Use Authorization (EUA). This EUA will remain in effect (meaning this test can be used) for the duration of the COVID-19 declaration under Section 564(b)(1) of the Act, 21 U.S.C. section 360bbb-3(b)(1), unless the authorization is terminated or revoked.  Performed at [facility], [address] [place], [place] [address]   CBG monitoring, ED     Status: Abnormal   Collection Time: 07/28/22  9:38 AM  Result Value Ref Range   Glucose-Capillary 60 (L) 70 - 99 mg/dL    Comment: Glucose reference range applies only to  samples taken after fasting for at least 8 hours.   Comment 1 Notify RN     Current Facility-Administered Medications  Medication Dose Route Frequency Provider Last  Rate Last Admin   insulin aspart (novoLOG) injection 0-15 Units  0-15 Units Subcutaneous TID WC Chesley Noon, MD       insulin aspart (novoLOG) injection 0-5 Units  0-5 Units Subcutaneous QHS Chesley Noon, MD       isosorbide mononitrate (IMDUR) 24 hr tablet 30 mg  30 mg Oral Daily Chesley Noon, MD   30 mg at 07/28/22 9892   metFORMIN (GLUCOPHAGE) tablet 500 mg  500 mg Oral BID WC Chesley Noon, MD       pravastatin (PRAVACHOL) tablet 40 mg  40 mg Oral Daily Chesley Noon, MD   40 mg at 07/28/22 0949   propranolol (INDERAL) tablet 60 mg  60 mg Oral Daily Chesley Noon, MD   60 mg at 07/28/22 1194   Current Outpatient Medications  Medication Sig Dispense Refill   insulin aspart (NOVOLOG) 100 UNIT/ML injection CBG < 70: implement hypoglycemia protocol CBG 70 - 120: 0 units CBG 121 - 150: 3 units CBG 151 - 200: 4 units CBG 201 - 250: 7 units CBG 251 - 300: 11 units CBG 301 - 350: 15 units CBG 351 - 400: 20 units 10 mL 2   insulin glargine (LANTUS) 100 UNIT/ML injection Inject 0.45 mLs (45 Units total) into the skin 2 (two) times daily. 10 mL 2   isosorbide mononitrate (IMDUR) 30 MG 24 hr tablet Take 1 tablet (30 mg total) by mouth daily. 30 tablet 2   metFORMIN (GLUCOPHAGE) 500 MG tablet Take 1 tablet (500 mg total) by mouth 2 (two) times daily with a meal. 60 tablet 0   pravastatin (PRAVACHOL) 40 MG tablet Take 1 tablet (40 mg total) by mouth daily. 30 tablet 0   propranolol (INDERAL) 60 MG tablet Take 1 tablet (60 mg total) by mouth daily. On an empty stomach 30 tablet 0   PROZAC 20 MG capsule Take 1 capsule (20 mg total) by mouth daily. 30 capsule 0   traZODone (DESYREL) 50 MG tablet Take 1 tablet (50 mg total) by mouth at bedtime. 30 tablet 0   ACETAMINOPHEN EXTRA STRENGTH 500 MG tablet Take 1,000 mg by mouth  every 6 (six) hours as needed. (Patient not taking: Reported on 07/27/2022)     aspirin EC 81 MG tablet Take 81 mg by mouth every morning.   (Patient not taking: Reported on 07/27/2022)     Biotin 10 MG CAPS Take 1 capsule by mouth daily. (Patient not taking: Reported on 07/27/2022)     budesonide (PULMICORT) 0.25 MG/2ML nebulizer solution Take 2 mLs (0.25 mg total) by nebulization 2 (two) times daily. (Patient not taking: Reported on 07/27/2022) 60 mL 12   Cholecalciferol (VITAMIN D3) 50 MCG (2000 UT) TABS Take 1 tablet by mouth daily. (Patient not taking: Reported on 07/27/2022)     docusate sodium (COLACE) 100 MG capsule Take 100 mg by mouth 3 (three) times daily.   (Patient not taking: Reported on 07/27/2022)     ipratropium-albuterol (DUONEB) 0.5-2.5 (3) MG/3ML SOLN Take 3 mLs by nebulization every 4 (four) hours as needed. (Patient not taking: Reported on 07/27/2022) 360 mL 0   liraglutide (VICTOZA) 18 MG/3ML SOPN Inject 1.8 mg into the skin daily. (Patient not taking: Reported on 07/27/2022) 30 mL 0   loratadine (CLARITIN) 10 MG tablet Take 10 mg by mouth daily. (Patient not taking: Reported on 07/27/2022)     nystatin (MYCOSTATIN/NYSTOP) powder Apply topically 2 (two) times daily. (Patient not taking: Reported on 07/27/2022) 15 g 0   oxybutynin (  DITROPAN) 5 MG tablet Take 5 mg by mouth 3 (three) times daily. (Patient not taking: Reported on 07/27/2022)      Musculoskeletal: Strength & Muscle Tone: decreased Gait & Station:  did not assess Patient leans: N/A     Psychiatric Specialty Exam:  Presentation  General Appearance: Appropriate for Environment  Eye Contact:Good  Speech:Garbled (no teeth)  Speech Volume:Normal  Handedness:No data recorded  Mood and Affect  Mood:Euthymic  Affect:Congruent   Thought Process  Thought Processes:Coherent; Linear  Descriptions of Associations:Intact  Orientation:Full (Time, Place and Person)  Thought Content:WDL  History of  Schizophrenia/Schizoaffective disorder:No  Duration of Psychotic Symptoms:No data recorded Hallucinations:Hallucinations: None  Ideas of Reference:None  Suicidal Thoughts:Suicidal Thoughts: No  Homicidal Thoughts:Homicidal Thoughts: No   Sensorium  Memory:Immediate Good  Judgment:Fair  Insight:No data recorded  Executive Functions  Concentration:Good  Attention Span:Good  Recall:Good  Fund of Knowledge:Fair  Language:Fair   Psychomotor Activity  Psychomotor Activity:Psychomotor Activity: Normal   Assets  Assets:Communication Skills; Desire for Improvement; Financial Resources/Insurance; Housing; Social Support; Resilience   Sleep  Sleep:Sleep: Fair   Physical Exam: Physical Exam Vitals and nursing note reviewed.  HENT:     Head: Normocephalic.     Nose: No congestion or rhinorrhea.  Eyes:     General:        Right eye: No discharge.        Left eye: No discharge.  Cardiovascular:     Rate and Rhythm: Normal rate.  Pulmonary:     Effort: Pulmonary effort is normal.  Musculoskeletal:        General: Normal range of motion.     Cervical back: Normal range of motion.  Skin:    General: Skin is dry.  Neurological:     Mental Status: She is alert and oriented to person, place, and time.  Psychiatric:        Attention and Perception: Attention normal.        Mood and Affect: Mood normal.        Speech: Speech normal.        Behavior: Behavior normal. Behavior is cooperative.        Thought Content: Thought content normal. Thought content is not paranoid or delusional. Thought content does not include homicidal or suicidal ideation.        Cognition and Memory: Cognition normal.        Judgment: Judgment is impulsive.    Review of Systems  HENT: Negative.    Eyes: Negative.   Respiratory: Negative.    Musculoskeletal:        Patient reports she can walk with a walker.  Neurological:  Positive for weakness [facility] with a walker).   Blood  pressure (!) 157/98, pulse 85, temperature 98.3 F (36.8 C), temperature source Oral, resp. rate 18, height 5\' 6"  (1.676 m), weight 96.3 kg, SpO2 95 %. Body mass index is 34.27 kg/m.  Treatment Plan Summary: Patient is coherent, speaking in clear, linear sentences. Denies suicidal or homicidal  ideations. Does not appear to be in a manic state at this time. Family states they want her "checked out medically."   . Recommend increase Trazodone to 100mg  at night for sleep. Reviewed with Dr.  Disposition: No evidence of imminent risk to self or others at present.   Supportive therapy provided about ongoing stressors. Discussed crisis plan, support from social network, calling 911, coming to the Emergency Department, and calling Suicide Hotline.  , NP 07/28/2022 10:41 AM

## 2022-07-28 NOTE — BH Assessment (Signed)
Spoke with patient to complete updated reassessment. Patient was coherent, calm and pleasant. She as able to have conversation about what lead to her coming the ER. Spoke with patient's daughter, who was in the room, and she was able to provide collateral information. Both the patient and daughter are comfortable with her returning home and living with her husband. Patient denies SI/HI and AV/H.

## 2022-07-28 NOTE — ED Notes (Signed)
Pt waiting for daughter to come back to take patient home

## 2022-07-28 NOTE — ED Notes (Signed)
IVC/pending psych consult 

## 2022-07-28 NOTE — ED Notes (Signed)
Daughter and husband here. E-signature not working at this time. Pt verbalized understanding of D/C instructions, prescriptions and follow up care with no further questions at this time. Pt in NAD and wheeled to car at time of D/C. All belongings returned to patient at time of departure.

## 2022-07-28 NOTE — ED Notes (Signed)
Pt cleaned up, new brief placed, linens changed. Pt given water

## 2022-07-28 NOTE — ED Notes (Signed)
Pt to CT

## 2022-07-28 NOTE — TOC Initial Note (Signed)
Transition of Care [facility]) - Initial/Assessment Note    Patient Details  Name: [name] MRN: [id number] Date of Birth: 11-Oct-1965  [facility] CM/SW Contact:    Allayne Butcher, RN Phone Number: 07/28/2022, 2:53 PM  Clinical Narrative:                 Patient is being discharged home from the emergency department today, she was seen and cleared by psychiatry.  Daughter, Wynona Canes was asking about home health for a few hours during the day to help her mom out.  Called and spoke with Wynona Canes, introduced self and explained role.  Instructed Christine to follow up with patient's primary care and have them fill out a referral for personal care services. There is a form that needs to be filled out on the [facility] website to get Medicaid approved PCS, her PCP should be able to submit this for her. Wynona Canes reports that patient has an appointment next Thursday and they will have the PCP do it then. Daughter will be coming to pick the patient up soon and transport home.         Patient Goals and CMS Choice        Expected Discharge Plan and Services                                                Prior Living Arrangements/Services                       Activities of Daily Living      Permission Sought/Granted                  Emotional Assessment              Admission diagnosis:  Psych Patient Active Problem List   Diagnosis Date Noted   Agitation 07/28/2022   Closed fracture dislocation of joint of right shoulder girdle    AKI (acute kidney injury) (HCC)    Traumatic closed displaced fracture of right shoulder with anterior dislocation 04/25/2021   Acute on chronic respiratory failure with hypoxia and hypercapnia (HCC) 04/23/2021   Acute on chronic respiratory failure with hypoxia (HCC) 04/22/2021   Acute renal failure superimposed on stage 3a chronic kidney disease (HCC) 04/22/2021   Fall    Anterior shoulder dislocation,  right, initial encounter    Chronic kidney disease, stage 2 (mild) 04/22/2018   Essential hypertension 04/22/2018   Hypercalcemia 04/22/2018   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   Acute respiratory failure with hypoxia (HCC) 04/15/2014   Septic shock(785.52) 04/15/2014   Sepsis (HCC) 04/15/2014   ARF (acute renal failure) (HCC) 04/12/2014   SBO (small bowel obstruction) (HCC) 04/11/2014   Bipolar disorder (HCC) 04/11/2014   DM type 2 (diabetes mellitus, type 2) (HCC) 04/11/2014   Seizure disorder (HCC) 04/11/2014   DEGENERATIVE JOINT DISEASE, RIGHT KNEE 07/13/2008   KNEE PAIN 07/13/2008   PCP:  [facility] Pharmacy:   [facility] - [place], [place] - [address] [place] [address] Phone: [phone] Fax: [phone]     Social Determinants of Health (SDOH) Interventions    Readmission Risk Interventions    04/25/2021    1:27 PM  Readmission Risk Prevention Plan  Transportation Screening  Complete  Home Care Screening Complete  Medication Review (RN CM) Complete

## 2022-07-28 NOTE — Progress Notes (Signed)
Client was seen earlier by the PMHNP and psych cleared.  This provider assessed her to release her from IVC.  She denies suicidal/homicidal ideations, hallucinations, or substance abuse.  Her daughter is at the bedside with no concerns except needing a few hours per day of home health and has an appointment on Thursday with a psychiatrist to manage her medications.  Psych cleared, IVC released.  Waylan Boga, PMHNP

## 2022-07-28 NOTE — ED Provider Notes (Signed)
-----------------------------------------   2:55 PM on 07/28/2022 ----------------------------------------- Patient appears to have returned to her baseline mental status, awake and alert, conversing appropriately with no signs of agitation or psychosis.  Family reports that they were concerned about patient's significant agitation and confusion last night, although this appears to have resolved.  Patient cleared by psychiatry and IVC was rescinded.  Due to concern for altered mental status, CT head was performed and negative for acute process.  Urinalysis obtained and does appear concerning for UTI, will send urine for culture and treat with Keflex.  Given patient has returned to her baseline mental status with no findings of sepsis and resolution of agitation, patient appropriate for treatment as an outpatient.  Daughter at bedside states patient has an appointment with her psychiatrist next week for follow-up.  Patient and daughter agree with plan.   Blake Divine, MD 07/28/22 [id number]

## 2022-07-28 NOTE — BH Assessment (Signed)
This Probation officer spoke with pt's daughter in law of the home [place] [phone]); who reported that she sits up with the pt every night and the pt barely sleeps at all. Ciara explained that the pt will curse her out for no reason. Ciara reported that the pt has a follow up appointment scheduled with her PCP next Thursday. Ciara reports that the pt has not taken any psych medications.

## 2022-07-28 NOTE — ED Provider Notes (Signed)
Emergency Medicine Observation Re-evaluation Note  [name] is a 56 y.o. female, seen on rounds today.  Pt initially presented to the ED for complaints of Psychiatric Evaluation Currently, the patient is resting, voices no medical complaints.  Physical Exam  BP (!) 140/105 (BP Location: Right Arm)   Pulse 90   Temp 98.2 F (36.8 C) (Oral)   Resp 20   Ht 5\' 6"  (1.676 m)   Wt 96.3 kg   SpO2 94%   BMI 34.27 kg/m  Physical Exam General: Resting in no distress Cardiac: No cyanosis Lungs: Equal rise and fall Psych: Not agitated  ED Course / MDM  EKG:   I have reviewed the labs performed to date as well as medications administered while in observation.  Recent changes in the last 24 hours include no events overnight.  Plan  Current plan is for psychiatric disposition.    Paulette Blanch, MD 07/28/22 [phone]

## 2022-07-28 NOTE — ED Notes (Signed)
Pt yelling in room. This RN went in to talk to pt. Pt yelling about a man, wanting to go home, etc. Pt agreeable to take medication to help her relax and calm down.

## 2022-07-31 LAB — URINE CULTURE: Culture: >=100000 — AB

## 2023-02-26 ENCOUNTER — Encounter (HOSPITAL_COMMUNITY): Payer: Self-pay | Admitting: Emergency Medicine

## 2023-02-26 ENCOUNTER — Emergency Department (HOSPITAL_COMMUNITY)
Admission: EM | Admit: 2023-02-26 | Discharge: 2023-02-28 | Disposition: A | Discharge status: Other Acute Inpatient Hospital | Payer: No Typology Code available for payment source | Attending: Emergency Medicine | Admitting: Emergency Medicine

## 2023-02-26 ENCOUNTER — Other Ambulatory Visit: Payer: Self-pay

## 2023-02-26 DIAGNOSIS — G20C Parkinsonism, unspecified: Secondary | ICD-10-CM | POA: Diagnosis not present

## 2023-02-26 DIAGNOSIS — J449 Chronic obstructive pulmonary disease, unspecified: Secondary | ICD-10-CM | POA: Insufficient documentation

## 2023-02-26 DIAGNOSIS — Z7982 Long term (current) use of aspirin: Secondary | ICD-10-CM | POA: Insufficient documentation

## 2023-02-26 DIAGNOSIS — Z79899 Other long term (current) drug therapy: Secondary | ICD-10-CM | POA: Diagnosis not present

## 2023-02-26 DIAGNOSIS — I10 Essential (primary) hypertension: Secondary | ICD-10-CM | POA: Insufficient documentation

## 2023-02-26 DIAGNOSIS — E119 Type 2 diabetes mellitus without complications: Secondary | ICD-10-CM | POA: Diagnosis not present

## 2023-02-26 DIAGNOSIS — G40909 Epilepsy, unspecified, not intractable, without status epilepticus: Secondary | ICD-10-CM

## 2023-02-26 DIAGNOSIS — Z794 Long term (current) use of insulin: Secondary | ICD-10-CM | POA: Diagnosis not present

## 2023-02-26 DIAGNOSIS — F312 Bipolar disorder, current episode manic severe with psychotic features: Secondary | ICD-10-CM | POA: Diagnosis present

## 2023-02-26 DIAGNOSIS — F23 Brief psychotic disorder: Secondary | ICD-10-CM | POA: Diagnosis not present

## 2023-02-26 DIAGNOSIS — Z7984 Long term (current) use of oral hypoglycemic drugs: Secondary | ICD-10-CM | POA: Insufficient documentation

## 2023-02-26 DIAGNOSIS — Z1152 Encounter for screening for COVID-19: Secondary | ICD-10-CM | POA: Diagnosis not present

## 2023-02-26 DIAGNOSIS — F29 Unspecified psychosis not due to a substance or known physiological condition: Secondary | ICD-10-CM | POA: Diagnosis present

## 2023-02-26 LAB — CBC
HCT: 42.7 % (ref 36.0–46.0)
Hemoglobin: 13.6 g/dL (ref 12.0–15.0)
MCH: 28.3 pg (ref 26.0–34.0)
MCHC: 31.9 g/dL (ref 30.0–36.0)
MCV: 88.8 fL (ref 80.0–100.0)
Platelets: 220 10*3/uL (ref 150–400)
RBC: 4.81 MIL/uL (ref 3.87–5.11)
RDW: 13.2 % (ref 11.5–15.5)
WBC: 5.9 10*3/uL (ref 4.0–10.5)
nRBC: 0 % (ref 0.0–0.2)

## 2023-02-26 LAB — COMPREHENSIVE METABOLIC PANEL
ALT: 15 U/L (ref 0–44)
AST: 17 U/L (ref 15–41)
Albumin: 3.8 g/dL (ref 3.5–5.0)
Alkaline Phosphatase: 94 U/L (ref 38–126)
Anion gap: 11 (ref 5–15)
BUN: 35 mg/dL — ABNORMAL HIGH (ref 6–20)
CO2: 25 mmol/L (ref 22–32)
Calcium: 9.4 mg/dL (ref 8.9–10.3)
Chloride: 98 mmol/L (ref 98–111)
Creatinine, Ser: 1.25 mg/dL — ABNORMAL HIGH (ref 0.44–1.00)
GFR, Estimated: 50 mL/min — ABNORMAL LOW (ref >60)
Glucose, Bld: 320 mg/dL — ABNORMAL HIGH (ref 70–99)
Potassium: 4.5 mmol/L (ref 3.5–5.1)
Sodium: 134 mmol/L — ABNORMAL LOW (ref 135–145)
Total Bilirubin: 0.7 mg/dL (ref 0.3–1.2)
Total Protein: 7.2 g/dL (ref 6.5–8.1)

## 2023-02-26 LAB — ETHANOL: Alcohol, Ethyl (B): <10 mg/dL (ref <10)

## 2023-02-26 LAB — SALICYLATE LEVEL: Salicylate Lvl: <7 mg/dL — ABNORMAL LOW (ref 7.0–30.0)

## 2023-02-26 LAB — CBG MONITORING, ED: Glucose-Capillary: 279 mg/dL — ABNORMAL HIGH (ref 70–99)

## 2023-02-26 LAB — ACETAMINOPHEN LEVEL: Acetaminophen (Tylenol), Serum: <10 ug/mL — ABNORMAL LOW (ref 10–30)

## 2023-02-26 MED ORDER — ZIPRASIDONE MESYLATE 20 MG IM SOLR
20.0000 mg | Freq: Once | INTRAMUSCULAR | Status: AC
  Administered 2023-02-26: 20 mg via INTRAMUSCULAR
  Filled 2023-02-26: qty 20

## 2023-02-26 MED ORDER — METFORMIN HCL 500 MG PO TABS
500.0000 mg | ORAL_TABLET | Freq: Two times a day (BID) | ORAL | Status: DC
  Administered 2023-02-27 – 2023-02-28 (×4): 500 mg via ORAL
  Filled 2023-02-26 (×4): qty 1

## 2023-02-26 MED ORDER — INSULIN ASPART 100 UNIT/ML IJ SOLN: 0.0000 [IU] | Freq: Every day | INTRAMUSCULAR | Status: DC

## 2023-02-26 MED ORDER — STERILE WATER FOR INJECTION IJ SOLN
INTRAMUSCULAR | Status: AC
  Administered 2023-02-26: 1.2 mL
  Filled 2023-02-26: qty 10

## 2023-02-26 MED ORDER — STERILE WATER FOR INJECTION IJ SOLN
INTRAMUSCULAR | Status: AC
  Administered 2023-02-26: 10 mL
  Filled 2023-02-26: qty 10

## 2023-02-26 MED ORDER — INSULIN DETEMIR 100 UNIT/ML ~~LOC~~ SOLN
40.0000 [IU] | Freq: Every day | SUBCUTANEOUS | Status: DC
  Administered 2023-02-26: 40 [IU] via SUBCUTANEOUS
  Filled 2023-02-26: qty 1
  Filled 2023-02-26 (×2): qty 0.4

## 2023-02-26 MED ORDER — OLANZAPINE 10 MG IM SOLR
10.0000 mg | Freq: Once | INTRAMUSCULAR | Status: AC
  Administered 2023-02-26: 10 mg via INTRAMUSCULAR
  Filled 2023-02-26: qty 10

## 2023-02-26 MED ORDER — INSULIN ASPART 100 UNIT/ML IJ SOLN
0.0000 [IU] | Freq: Three times a day (TID) | INTRAMUSCULAR | Status: DC
  Administered 2023-02-27: 15 [IU] via SUBCUTANEOUS
  Administered 2023-02-27: 8 [IU] via SUBCUTANEOUS
  Filled 2023-02-26 (×2): qty 1

## 2023-02-26 NOTE — ED Notes (Signed)
Pt refuses vital signs/ pt refuses to get out of wheelchair into bed.

## 2023-02-26 NOTE — BH Assessment (Addendum)
Clinician called [facility] and spoke to coordinator Lauren.  She said that a Dr. Alto Denver would be able to see the patient at 22:15.  Clinician gave her the phone number of current RN [place].

## 2023-02-26 NOTE — ED Notes (Signed)
Family has been called multiple times/ no answer unable to leave voicemail

## 2023-02-26 NOTE — ED Notes (Signed)
Pt is in the bed and being changed. Tried to explain to Pt that we are trying to help her. And she needs to talk to the TTS if she wants to possibly go home.

## 2023-02-26 NOTE — Consult Note (Signed)
[facility] Consult Note  Patient Name: [name] MRN: [id number] DOB: 1966-04-19 DATE OF Consult: 02/26/2023  PRIMARY PSYCHIATRIC DIAGNOSES  1.  Bipolar Disorder with Psychosis 2.  Aggression    RECOMMENDATIONS  Recommendations: Medication recommendations: Zyprexa 10mg  PO/IM Q6H PRN for acute agitation  Non-Medication/therapeutic recommendations: IVC. Inpatient Psychiatric Admission Is inpatient psychiatric hospitalization recommended for this patient? Yes (Explain why): violence, psychosis, in the setting of [facility] Follow-Up Telepsychiatry C/L services: We will continue to follow this patient with you until stabilized or discharged.  If you have any questions or concerns, please call our TeleCare Coordination service at  [phone] and ask for myself or the provider on-call. Communication: Treatment team members (and family members if applicable) who were involved in treatment/care discussions and planning, and with whom we spoke or engaged with via secure text/chat, include the following: Dr. Rubin Payor  Thank you for involving [place] in the care of this patient. If you have any additional questions or concerns, please call [phone] and ask for me or the provider on-call.  TELEPSYCHIATRY ATTESTATION & CONSENT  As the provider for this telehealth consult, I attest that I verified the patient's identity using two separate identifiers, introduced myself to the patient, provided my credentials, disclosed my location, and performed this encounter via a HIPAA-compliant, real-time, face-to-face, two-way, interactive audio and video platform and with the full consent and agreement of the patient (or guardian as applicable.)  Patient physical location: ED in [facility]  Telehealth provider physical location: home office in state of [place]   Video start time: 2143 [facility] Time) Video end time: 2148 [facility] Time)  IDENTIFYING DATA  [name] is a 57 y.o. year-old  female for whom a psychiatric consultation has been ordered by the primary provider. The patient was identified using two separate identifiers.  CHIEF COMPLAINT/REASON FOR CONSULT  "I'm not talking to you"  HISTORY OF PRESENT ILLNESS (HPI)  The patient is a 57yo female with past psychiatric history of Bipolar, Psychosis who is presenting to the ED with concerns of erratic behavior, mania, and aggression/ violence towards family, in the setting of medication non-compliance for about 2 months. Upon interview, patient is yelling, very irritable, and appears somewhat paranoid with this Clinical research associate. Patient states "I'm not talking to you, you ruined my day". When I ask her how I ruined her day she proceeds to share that she has been in the ED since 4pm and I have "put my life in jeopardy". She tells me she was "supposed to be somewhere tonight with the love of my life and now you ruined it". She will not answer assessment questions, continues to yell "leave me alone I'm not talking to you".  Patient appears elevated, irritable and appears to have paranoid delusions. Unable to assess suicidality and homicidal ideations given her unwillingness to speak with me.   Given Geodon 20mg  IM @ 1543    PAST PSYCHIATRIC HISTORY  Bipolar, Psychosis  Otherwise as per HPI above.  PAST MEDICAL HISTORY  Past Medical History:  Diagnosis Date   Agitation 07/28/2022   Anxiety    COPD (chronic obstructive pulmonary disease) (HCC)    Diabetes mellitus    Fear of    falling   Hemophilia (HCC)    Hypertension    Panic attacks    Parkinson's disease    Seizures (HCC)      HOME MEDICATIONS  Facility Ordered Medications  Medication   [COMPLETED] ziprasidone (GEODON) injection 20 mg   [COMPLETED]  sterile water (preservative free) injection   OLANZapine (ZYPREXA) injection 10 mg   PTA Medications  Medication Sig   docusate sodium (COLACE) 100 MG capsule Take 100 mg by mouth 3 (three) times daily.   (Patient not  taking: Reported on 07/27/2022)   aspirin EC 81 MG tablet Take 81 mg by mouth every morning.   (Patient not taking: Reported on 07/27/2022)   insulin aspart (NOVOLOG) 100 UNIT/ML injection CBG < 70: implement hypoglycemia protocol CBG 70 - 120: 0 units CBG 121 - 150: 3 units CBG 151 - 200: 4 units CBG 201 - 250: 7 units CBG 251 - 300: 11 units CBG 301 - 350: 15 units CBG 351 - 400: 20 units   Biotin 10 MG CAPS Take 1 capsule by mouth daily. (Patient not taking: Reported on 07/27/2022)   Cholecalciferol (VITAMIN D3) 50 MCG (2000 UT) TABS Take 1 tablet by mouth daily. (Patient not taking: Reported on 07/27/2022)   nystatin (MYCOSTATIN/NYSTOP) powder Apply topically 2 (two) times daily. (Patient not taking: Reported on 07/27/2022)   ACETAMINOPHEN EXTRA STRENGTH 500 MG tablet Take 1,000 mg by mouth every 6 (six) hours as needed. (Patient not taking: Reported on 07/27/2022)   loratadine (CLARITIN) 10 MG tablet Take 10 mg by mouth daily. (Patient not taking: Reported on 07/27/2022)   oxybutynin (DITROPAN) 5 MG tablet Take 5 mg by mouth 3 (three) times daily. (Patient not taking: Reported on 07/27/2022)   budesonide (PULMICORT) 0.25 MG/2ML nebulizer solution Take 2 mLs (0.25 mg total) by nebulization 2 (two) times daily. (Patient not taking: Reported on 07/27/2022)   ipratropium-albuterol (DUONEB) 0.5-2.5 (3) MG/3ML SOLN Take 3 mLs by nebulization every 4 (four) hours as needed. (Patient not taking: Reported on 07/27/2022)   isosorbide mononitrate (IMDUR) 30 MG 24 hr tablet Take 1 tablet (30 mg total) by mouth daily.   liraglutide (VICTOZA) 18 MG/3ML SOPN Inject 1.8 mg into the skin daily. (Patient not taking: Reported on 07/27/2022)   metFORMIN (GLUCOPHAGE) 500 MG tablet Take 1 tablet (500 mg total) by mouth 2 (two) times daily with a meal.   pravastatin (PRAVACHOL) 40 MG tablet Take 1 tablet (40 mg total) by mouth daily.   propranolol (INDERAL) 60 MG tablet Take 1 tablet (60 mg total) by mouth daily. On an  empty stomach   PROZAC 20 MG capsule Take 1 capsule (20 mg total) by mouth daily.   traZODone (DESYREL) 50 MG tablet Take 1 tablet (50 mg total) by mouth at bedtime.   insulin glargine (LANTUS) 100 UNIT/ML injection Inject 0.45 mLs (45 Units total) into the skin 2 (two) times daily.     ALLERGIES  Allergies  Allergen Reactions   Bee Venom Hives    SOCIAL & SUBSTANCE USE HISTORY  Social History   Socioeconomic History   Marital status: Married    Spouse name: Not on file   Number of children: Not on file   Years of education: Not on file   Highest education level: Not on file  Occupational History   Not on file  Tobacco Use   Smoking status: Never   Smokeless tobacco: Never  Substance and Sexual Activity   Alcohol use: No   Drug use: No   Sexual activity: Yes    Birth control/protection: Surgical  Other Topics Concern   Not on file  Social History Narrative   Not on file   Social Determinants of Health   Financial Resource Strain: Not on file  Food Insecurity: Not on file  Transportation Needs: Not on file  Physical Activity: Not on file  Stress: Not on file  Social Connections: Not on file   Social History   Tobacco Use  Smoking Status Never  Smokeless Tobacco Never   Social History   Substance and Sexual Activity  Alcohol Use No   Social History   Substance and Sexual Activity  Drug Use No    Additional pertinent information: unable to assess   FAMILY HISTORY  History reviewed. No pertinent family history. Family Psychiatric History (if known):  unable to assess   MENTAL STATUS EXAM (MSE)  Limited due to patient's unwillingness to cooperate with assessment; also due to psychosis   Presentation  General Appearance:  Disheveled  Eye Contact: Fleeting  Speech: Garbled; Other (comment) (yelling, loud)  Speech Volume: Increased  Handedness:No data recorded  Mood and Affect  Mood: Angry; Labile  Affect: Labile   Thought Process   Thought Processes: Disorganized  Descriptions of Associations: -- (Unable to assess at this time)  Orientation: Full (Time, Place and Person)  Thought Content: Illogical; Paranoid Ideation  History of Schizophrenia/Schizoaffective disorder: -- (Unknown at this time)  Duration of Psychotic Symptoms:-- (Unknown at this time)  Hallucinations:Hallucinations: Other (comment) (family reported patient hallucinating at home)  Ideas of Reference: Delusions; Paranoia  Suicidal Thoughts:Suicidal Thoughts: -- (Unable to assess)  Homicidal Thoughts:Homicidal Thoughts: -- (unable to assess)   Sensorium  Memory: Immediate Poor  Judgment: Poor  Insight:Poor  Executive Functions  Concentration: Poor  Attention Span: -- (unable to assess at this time)  Recall: -- (unable to assess at this time)  [employer] of Knowledge: Poor  Language: Fair   Psychomotor Activity  Psychomotor Activity:Psychomotor Activity: Increased  Assets  Assets: Other (comment) (None at this time)   Sleep  Sleep:Sleep: -- (unable to assess)   VITALS  Blood pressure 133/89, pulse (!) 104, temperature 97.9 F (36.6 C), temperature source Oral, resp. rate 18, height 5\' 6"  (1.676 m), weight 117.9 kg, SpO2 90 %.  LABS  Admission on 02/26/2023  Component Date Value Ref Range Status   Sodium 02/26/2023 134 (L)  135 - 145 mmol/L Final   Potassium 02/26/2023 4.5  3.5 - 5.1 mmol/L Final   Chloride 02/26/2023 98  98 - 111 mmol/L Final   CO2 02/26/2023 25  22 - 32 mmol/L Final   Glucose, Bld 02/26/2023 320 (H)  70 - 99 mg/dL Final   Glucose reference range applies only to samples taken after fasting for at least 8 hours.   BUN 02/26/2023 35 (H)  6 - 20 mg/dL Final   Creatinine, Ser 02/26/2023 1.25 (H)  0.44 - 1.00 mg/dL Final   Calcium 60/45/4098 9.4  8.9 - 10.3 mg/dL Final   Total Protein 11/91/4782 7.2  6.5 - 8.1 g/dL Final   Albumin 95/62/1308 3.8  3.5 - 5.0 g/dL Final   AST 65/78/4696 17  15 -  41 U/L Final   ALT 02/26/2023 15  0 - 44 U/L Final   Alkaline Phosphatase 02/26/2023 94  38 - 126 U/L Final   Total Bilirubin 02/26/2023 0.7  0.3 - 1.2 mg/dL Final   GFR, Estimated 02/26/2023 50 (L)  >60 mL/min Final   Comment: (NOTE) Calculated using the CKD-EPI Creatinine Equation (2021)    Anion gap 02/26/2023 11  5 - 15 Final   Performed at [facility], [address] [place], [place] [address]   Alcohol, Ethyl (B) 02/26/2023 <10  <10 mg/dL Final   Comment: (NOTE) Lowest detectable  limit for serum alcohol is 10 mg/dL.  For medical purposes only. Performed at [facility], [address] [place], [place] [address]    Salicylate Lvl 02/26/2023 <7.0 (L)  7.0 - 30.0 mg/dL Final   Performed at [facility], [address], [place], [place] [address]   Acetaminophen (Tylenol), Serum 02/26/2023 <10 (L)  10 - 30 ug/mL Final   Comment: (NOTE) Therapeutic concentrations vary significantly. A range of 10-30 ug/mL  may be an effective concentration for many patients. However, some  are best treated at concentrations outside of this range. Acetaminophen concentrations >150 ug/mL at 4 hours after ingestion  and >50 ug/mL at 12 hours after ingestion are often associated with  toxic reactions.  Performed at [facility], [address] [place], [place] [address]    WBC 02/26/2023 5.9  4.0 - 10.5 K/uL Final   RBC 02/26/2023 4.81  3.87 - 5.11 MIL/uL Final   Hemoglobin 02/26/2023 13.6  12.0 - 15.0 g/dL Final   HCT 91/47/8295 42.7  36.0 - 46.0 % Final   MCV 02/26/2023 88.8  80.0 - 100.0 fL Final   MCH 02/26/2023 28.3  26.0 - 34.0 pg Final   MCHC 02/26/2023 31.9  30.0 - 36.0 g/dL Final   RDW 62/13/0865 13.2  11.5 - 15.5 % Final   Platelets 02/26/2023 220  150 - 400 K/uL Final   nRBC 02/26/2023 0.0  0.0 - 0.2 % Final   Performed at [facility], [address], [place], [place] [address]    PSYCHIATRIC REVIEW OF SYSTEMS (ROS)  ROS: Notable for the following relevant positive  findings: ROS  Additional findings:      Musculoskeletal: No abnormal movements observed      Gait & Station: Wheelchair/Walker      Pain Screening: Unable to assess at this time       Nutrition & Dental Concerns: Unable to assess at this time   RISK FORMULATION/ASSESSMENT  Is the patient experiencing any suicidal or homicidal ideations:        Explain if yes: unable to assess at this time due to acute mania/psychosis  Protective factors considered for safety management:   Risk factors/concerns considered for safety management: Acute psychosis, lack of insight and poor judgement, irritability  Physical illness/chronic pain Impulsivity Aggression  Is there a safety management plan with the patient and treatment team to minimize risk factors and promote protective factors: Yes           Explain: IVC; inpatient psychiatric admission  Is crisis care placement or psychiatric hospitalization recommended: Yes     Based on my current evaluation and risk assessment, patient is determined at this time to be at:  High risk  *RISK ASSESSMENT Risk assessment is a dynamic process; it is possible that this patient's condition, and risk level, may change. This should be re-evaluated and managed over time as appropriate. Please re-consult psychiatric consult services if additional assistance is needed in terms of risk assessment and management. If your team decides to discharge this patient, please advise the patient how to best access emergency psychiatric services, or to call 911, if their condition worsens or they feel unsafe in any way.   Assunta Gambles, NP Telepsychiatry Consult Services

## 2023-02-26 NOTE — ED Triage Notes (Signed)
Pt has been off mental health medications for about 2 months and is acting erratic and manic in triage, easily agitated and irritable. Hx includes manic depression, seizures, DM2 (out of insulin). Attempted to schedule MH appointment but earliest available was 2 months out so they recommended ER to get back on meds.

## 2023-02-26 NOTE — ED Notes (Signed)
Pt did not like the frozen meal. Waiting for a sandwich to be brought up to the ED. Offered pt a recliner or a different chair, pt refusing at this time. RN notified and aware.

## 2023-02-26 NOTE — ED Provider Notes (Signed)
  Provider Note MRN:  [id number]  Arrival date & time: 02/27/23    ED Course and Medical Decision Making  Assumed care from Dr. Rubin Payor at shift change.  Hallucinating, concern for acute psychosis.  Awaiting urinalysis.  Suspect inpatient psych admission is the disposition.  4 AM update: Patient required intramuscular Ativan for agitation.  Urinalysis is showing some evidence to suggest infection, will start Keflex 3 times daily for a week.  Medically cleared, awaiting TTS basement/recommendations.  Signed out to default provider.  .Critical Care  Performed by: Sabas Sous, MD Authorized by: Sabas Sous, MD   Critical care provider statement:    Critical care time (minutes):  35   Critical care was necessary to treat or prevent imminent or life-threatening deterioration of the following conditions: Psychosis with agitation.   Critical care was time spent personally by me on the following activities:  Development of treatment plan with patient or surrogate, discussions with consultants, evaluation of patient's response to treatment, examination of patient, ordering and review of laboratory studies, ordering and review of radiographic studies, ordering and performing treatments and interventions, pulse oximetry, re-evaluation of patient's condition and review of old charts   Final Clinical Impressions(s) / ED Diagnoses     ICD-10-CM   1. Acute psychosis (HCC)  F23     2. Morbid obesity with BMI of 40.0-44.9, adult (HCC)  E66.01    Z68.41     3. Seizure disorder (HCC) Chronic G40.909     4. Psychosis, unspecified psychosis type [facility])  F29       ED Discharge Orders     None       Discharge Instructions   None     Elmer Sow. Pilar Plate, MD [facility] Emergency Medicine [facility] [email or web address]    Sabas Sous, MD 02/27/23 [phone]

## 2023-02-26 NOTE — ED Provider Notes (Signed)
[place] EMERGENCY DEPARTMENT AT [facility] Provider Note   CSN: [id number] Arrival date & time: 02/26/23  1501     History  Chief Complaint  Patient presents with   Mental Health Problem    [name] is a 57 y.o. female.   Mental Health Problem Patient brought in with family members.  Reportedly been off her psychiatric medicines for couple months.  Reportedly history of seizures.  Not on any seizure medications.  Has been hallucinating.  Reportedly seeing things that are not there.  Patient will not talk to me and is loudly yelling in the ER. Reportedly has been on her regular medicines although there is some question whether she was on insulin.  Reportedly not on any psychiatric or seizure medicines.     Home Medications Prior to Admission medications   Medication Sig Start Date End Date Taking? Authorizing Provider  ACETAMINOPHEN EXTRA STRENGTH 500 MG tablet Take 1,000 mg by mouth every 6 (six) hours as needed. Patient not taking: Reported on 07/27/2022 11/02/21   [provider]  aspirin EC 81 MG tablet Take 81 mg by mouth every morning.   Patient not taking: Reported on 07/27/2022    [provider]  Biotin 10 MG CAPS Take 1 capsule by mouth daily. Patient not taking: Reported on 07/27/2022    [provider]  budesonide (PULMICORT) 0.25 MG/2ML nebulizer solution Take 2 mLs (0.25 mg total) by nebulization 2 (two) times daily. Patient not taking: Reported on 07/27/2022 07/08/22   Terrilee Files, MD  Cholecalciferol (VITAMIN D3) 50 MCG (2000 UT) TABS Take 1 tablet by mouth daily. Patient not taking: Reported on 07/27/2022    [provider]  docusate sodium (COLACE) 100 MG capsule Take 100 mg by mouth 3 (three) times daily.   Patient not taking: Reported on 07/27/2022    [provider]  insulin aspart (NOVOLOG) 100 UNIT/ML injection CBG < 70: implement hypoglycemia protocol CBG 70 - 120: 0 units CBG 121 - 150: 3  units CBG 151 - 200: 4 units CBG 201 - 250: 7 units CBG 251 - 300: 11 units CBG 301 - 350: 15 units CBG 351 - 400: 20 units 04/20/14   Kathlen Mody, MD  insulin glargine (LANTUS) 100 UNIT/ML injection Inject 0.45 mLs (45 Units total) into the skin 2 (two) times daily. 07/08/22   Terrilee Files, MD  ipratropium-albuterol (DUONEB) 0.5-2.5 (3) MG/3ML SOLN Take 3 mLs by nebulization every 4 (four) hours as needed. Patient not taking: Reported on 07/27/2022 07/08/22   Terrilee Files, MD  isosorbide mononitrate (IMDUR) 30 MG 24 hr tablet Take 1 tablet (30 mg total) by mouth daily. 07/08/22   Terrilee Files, MD  liraglutide (VICTOZA) 18 MG/3ML SOPN Inject 1.8 mg into the skin daily. Patient not taking: Reported on 07/27/2022 07/08/22   Terrilee Files, MD  loratadine (CLARITIN) 10 MG tablet Take 10 mg by mouth daily. Patient not taking: Reported on 07/27/2022    [provider]  metFORMIN (GLUCOPHAGE) 500 MG tablet Take 1 tablet (500 mg total) by mouth 2 (two) times daily with a meal. 07/08/22   Terrilee Files, MD  nystatin (MYCOSTATIN/NYSTOP) powder Apply topically 2 (two) times daily. Patient not taking: Reported on 07/27/2022 04/29/21   Erick Blinks, MD  oxybutynin (DITROPAN) 5 MG tablet Take 5 mg by mouth 3 (three) times daily. Patient not taking: Reported on 07/27/2022    [provider]  pravastatin (PRAVACHOL) 40 MG  tablet Take 1 tablet (40 mg total) by mouth daily. 07/08/22   Terrilee Files, MD  propranolol (INDERAL) 60 MG tablet Take 1 tablet (60 mg total) by mouth daily. On an empty stomach 07/08/22   Terrilee Files, MD  PROZAC 20 MG capsule Take 1 capsule (20 mg total) by mouth daily. 07/08/22   Terrilee Files, MD  traZODone (DESYREL) 50 MG tablet Take 1 tablet (50 mg total) by mouth at bedtime. 07/08/22   Terrilee Files, MD      Allergies    Bee venom    Review of Systems   Review of Systems  Physical Exam Updated Vital Signs BP 133/89    Pulse (!) 104   Temp 97.9 F (36.6 C) (Oral)   Resp 18   Ht 5\' 6"  (1.676 m)   Wt 117.9 kg   SpO2 90%   BMI 41.97 kg/m  Physical Exam Vitals reviewed.  Cardiovascular:     Rate and Rhythm: Regular rhythm.  Pulmonary:     Effort: No respiratory distress.  Neurological:     Mental Status: She is alert.     ED Results / Procedures / Treatments   Labs (all labs ordered are listed, but only abnormal results are displayed) Labs Reviewed  COMPREHENSIVE METABOLIC PANEL - Abnormal; Notable for the following components:      Result Value   Sodium 134 (*)    Glucose, Bld 320 (*)    BUN 35 (*)    Creatinine, Ser 1.25 (*)    GFR, Estimated 50 (*)    All other components within normal limits  SALICYLATE LEVEL - Abnormal; Notable for the following components:   Salicylate Lvl <7.0 (*)    All other components within normal limits  ACETAMINOPHEN LEVEL - Abnormal; Notable for the following components:   Acetaminophen (Tylenol), Serum <10 (*)    All other components within normal limits  ETHANOL  CBC  RAPID URINE DRUG SCREEN, HOSP PERFORMED  URINALYSIS, W/ REFLEX TO CULTURE (INFECTION SUSPECTED)  HEMOGLOBIN A1C    EKG None  Radiology No results found.  Procedures Procedures    Medications Ordered in ED Medications  insulin aspart (novoLOG) injection 0-15 Units (has no administration in time range)  insulin detemir (LEVEMIR) injection 40 Units (has no administration in time range)  insulin aspart (novoLOG) injection 0-5 Units (has no administration in time range)  metFORMIN (GLUCOPHAGE) tablet 500 mg (has no administration in time range)  ziprasidone (GEODON) injection 20 mg (20 mg Intramuscular Given 02/26/23 1543)  sterile water (preservative free) injection (1.2 mLs  Given 02/26/23 1543)  OLANZapine (ZYPREXA) injection 10 mg (10 mg Intramuscular Given 02/26/23 2327)  sterile water (preservative free) injection (10 mLs  Given 02/26/23 2328)    ED Course/ Medical Decision  Making/ A&P                             Medical Decision Making Amount and/or Complexity of Data Reviewed Labs: ordered.  Risk Prescription drug management.   Patient with psychiatric disorder.  Reportedly off her medicines.  Hallucinating.  Will not really provide any history.  Patient is medically cleared.  Mild hyperglycemia but not in DKA.  Urinalysis still pending but do not think urinalysis would change disposition.  Potentially if unable to give urine would just treat empirically since reported dysuria.  Will have patient seen by TTS.  Patient has been seen by psychiatry.  Recommends inpatient  treatment.  Recommended dose of Zyprexa IM here.          Final Clinical Impression(s) / ED Diagnoses Final diagnoses:  Psychosis, unspecified psychosis type [facility])    Rx / DC Orders ED Discharge Orders     None         Benjiman Core, MD 02/26/23 2330

## 2023-02-26 NOTE — ED Notes (Signed)
Family endorses violent behavior towards others, but has not seen any suicidal behavior.

## 2023-02-27 LAB — RAPID URINE DRUG SCREEN, HOSP PERFORMED
Amphetamines: NOT DETECTED
Barbiturates: NOT DETECTED
Benzodiazepines: NOT DETECTED
Cocaine: NOT DETECTED
Opiates: NOT DETECTED
Tetrahydrocannabinol: NOT DETECTED

## 2023-02-27 LAB — URINALYSIS, W/ REFLEX TO CULTURE (INFECTION SUSPECTED)
Bilirubin Urine: NEGATIVE
Glucose, UA: 50 mg/dL — AB
Ketones, ur: NEGATIVE mg/dL
Nitrite: NEGATIVE
Protein, ur: >=300 mg/dL — AB
Specific Gravity, Urine: 1.016 (ref 1.005–1.030)
WBC, UA: >50 WBC/hpf (ref 0–5)
pH: 5 (ref 5.0–8.0)

## 2023-02-27 LAB — CBG MONITORING, ED
Glucose-Capillary: 293 mg/dL — ABNORMAL HIGH (ref 70–99)
Glucose-Capillary: 435 mg/dL — ABNORMAL HIGH (ref 70–99)
Glucose-Capillary: 389 mg/dL — ABNORMAL HIGH (ref 70–99)
Glucose-Capillary: 344 mg/dL — ABNORMAL HIGH (ref 70–99)
Glucose-Capillary: 266 mg/dL — ABNORMAL HIGH (ref 70–99)

## 2023-02-27 MED ORDER — INSULIN GLARGINE-YFGN 100 UNIT/ML ~~LOC~~ SOLN
50.0000 [IU] | Freq: Every day | SUBCUTANEOUS | Status: DC
  Administered 2023-02-27: 50 [IU] via SUBCUTANEOUS
  Filled 2023-02-27 (×2): qty 0.5

## 2023-02-27 MED ORDER — INSULIN ASPART 100 UNIT/ML IJ SOLN
6.0000 [IU] | Freq: Three times a day (TID) | INTRAMUSCULAR | Status: DC
  Administered 2023-02-28 (×3): 6 [IU] via SUBCUTANEOUS
  Filled 2023-02-27 (×2): qty 1

## 2023-02-27 MED ORDER — LORAZEPAM 2 MG/ML IJ SOLN
2.0000 mg | Freq: Once | INTRAMUSCULAR | Status: AC
  Administered 2023-02-27: 2 mg via INTRAMUSCULAR
  Filled 2023-02-27: qty 1

## 2023-02-27 MED ORDER — QUETIAPINE FUMARATE 25 MG PO TABS
50.0000 mg | ORAL_TABLET | Freq: Every day | ORAL | Status: DC
  Administered 2023-02-27: 50 mg via ORAL
  Filled 2023-02-27: qty 2

## 2023-02-27 MED ORDER — ZIPRASIDONE MESYLATE 20 MG IM SOLR
10.0000 mg | Freq: Once | INTRAMUSCULAR | Status: AC
  Administered 2023-02-27: 10 mg via INTRAMUSCULAR
  Filled 2023-02-27: qty 20

## 2023-02-27 MED ORDER — OLANZAPINE 10 MG IM SOLR: 10.0000 mg | Freq: Three times a day (TID) | INTRAMUSCULAR | Status: DC | PRN

## 2023-02-27 MED ORDER — CEPHALEXIN 500 MG PO CAPS
500.0000 mg | ORAL_CAPSULE | Freq: Three times a day (TID) | ORAL | Status: DC
  Administered 2023-02-27 – 2023-02-28 (×4): 500 mg via ORAL
  Filled 2023-02-27 (×4): qty 1

## 2023-02-27 MED ORDER — INSULIN ASPART 100 UNIT/ML IJ SOLN
0.0000 [IU] | Freq: Three times a day (TID) | INTRAMUSCULAR | Status: DC
  Administered 2023-02-27: 15 [IU] via SUBCUTANEOUS
  Administered 2023-02-28: 11 [IU] via SUBCUTANEOUS
  Administered 2023-02-28: 15 [IU] via SUBCUTANEOUS
  Administered 2023-02-28: 7 [IU] via SUBCUTANEOUS
  Filled 2023-02-27 (×4): qty 1

## 2023-02-27 MED ORDER — OLANZAPINE 5 MG PO TABS
5.0000 mg | ORAL_TABLET | Freq: Three times a day (TID) | ORAL | Status: DC | PRN
  Administered 2023-02-27 – 2023-02-28 (×2): 5 mg via ORAL
  Filled 2023-02-27 (×2): qty 1

## 2023-02-27 MED ORDER — FLUOXETINE HCL 20 MG PO CAPS
20.0000 mg | ORAL_CAPSULE | Freq: Every day | ORAL | Status: DC
  Administered 2023-02-27 – 2023-02-28 (×2): 20 mg via ORAL
  Filled 2023-02-27 (×2): qty 1

## 2023-02-27 MED ORDER — INSULIN ASPART 100 UNIT/ML IJ SOLN
0.0000 [IU] | Freq: Every day | INTRAMUSCULAR | Status: DC
  Administered 2023-02-27: 3 [IU] via SUBCUTANEOUS
  Filled 2023-02-27: qty 1

## 2023-02-27 MED ORDER — LORAZEPAM 2 MG/ML IJ SOLN
3.0000 mg | Freq: Once | INTRAMUSCULAR | Status: DC
  Filled 2023-02-27 (×2): qty 2

## 2023-02-27 NOTE — ED Notes (Signed)
This rn called the ac to bring pt's semglee. Ac to bring asap

## 2023-02-27 NOTE — ED Notes (Addendum)
While providing shower and hair wash to pt the pt began rambling and arguing with an unseen person again, stating that she was going to shoot and kill them for messing up her CD player and tapes. When this tech asked pt who she was referring to pt pointed to the corner and stated "Nacho" when asked who Nacho is pt began laughing and stated that Nacho is her husband and that is his nickname but that she will not tell me his real name for this writers "safety"

## 2023-02-27 NOTE — ED Notes (Signed)
Pt has been changed and helped into the recliner. Pt given a diet coke and cranberry juice.

## 2023-02-27 NOTE — ED Notes (Signed)
CBG @2156  resulted 266.

## 2023-02-27 NOTE — ED Notes (Signed)
This RN spoke with Sharlyne Pacas at [facility]. Covid test requested at this time as well as IVC paperwork faxed to [phone]

## 2023-02-27 NOTE — Inpatient Diabetes Management (Signed)
Inpatient Diabetes Program Recommendations  AACE/ADA: New Consensus Statement on Inpatient Glycemic Control   Target Ranges:  Prepandial:   less than 140 mg/dL      Peak postprandial:   less than 180 mg/dL (1-2 hours)      Critically ill patients:  140 - 180 mg/dL    Latest Reference Range & Units 02/27/23 08:42 02/27/23 11:48  Glucose-Capillary 70 - 99 mg/dL 161 (H) 096 (H)   Review of Glycemic Control  Diabetes history: DM2 Outpatient Diabetes medications: Lantus 45 units BID, Novolog 0-20 units, Victoza 1.8 mg daily, Metformin 500 mg BID Current orders for Inpatient glycemic control: Levemir 40 units QHS, Metformin 500 mg BID, Novolog 0-15 units TID with meals, Novolog 0-5 units QHS  Inpatient Diabetes Program Recommendations:    Insulin: Please discontinue Levemir (likely not lasting 24 hours) and order Semglee 50 units QHS, increase Novolog correction to 0-20 units TID with meals, and order Novolog 6 units TID with meals for meal coverage if patient eats at least 50% of meals.   Thanks, Orlando Penner, RN, MSN, CDCES Diabetes Coordinator Inpatient Diabetes Program [phone] (Team Pager from 8am to 5pm)

## 2023-02-27 NOTE — ED Notes (Signed)
Pt given phone to call their daughter.

## 2023-02-27 NOTE — Progress Notes (Signed)
LCSW Progress Note  [id number]   [name]  02/27/2023  3:46 PM  Description:   Inpatient Psychiatric Referral  Patient was recommended inpatient per Eligha Bridegroom, NP. There are no available beds at [facility] or [facility], per [facility] Rona Ravens, RN. Patient was referred to the following out of network facilities:   [facility] Provider Address Phone Fax  [facility]  [address], [place] [address] [phone]  [facility]  [address] [place] [address] [phone]  [facility]  [address] [place] [address] [phone]  [facility] [place]  [address] [place], [place] [address] [phone]  [facility] [address] [place]  [address], [place] [address] [phone]  [facility]  [address], [place] [address] [phone]  [facility]-Geriatric  [address] [place] [address] [phone]  [facility]  [address] [place] [address] [phone]  [facility]  [address] [place] [address] [phone]  [facility]  [address] [place] [address] [phone]  [facility]  [address] [place] [address] [phone]  [facility]  [address] [place] [address] [phone]  [facility]  [address] [place] [address] [phone]  [facility]  [address] [place] [address] [phone]  [facility]  [address] [place] [address] [phone]  [facility]  [address]  [place] [address] [phone]  [facility]  [address] [place] [address] [phone]  [facility]  [address] [place] [place] [address] [phone]  [facility]  [address], [place] [address] [phone]  [facility]  [address] [place], [place] [address] [phone]  [facility]  [address], [place] [address] [phone]  [facility]  [address], [place] [address] [phone]  [facility]  [address] [place] [address] [phone]  [facility]  [address], [place] [address] [phone]  [facility]  [address] [place], [place] [address] [phone]  [facility] [place] [address] [phone]  [facility]  [address] [place], [place] [address] [phone]  [facility]  [address] [place] [address] [phone]  [facility]  [address] [place] [address] [phone]      Situation ongoing, CSW to continue following and update chart as more information becomes available.      Cathie Beams, [place]  02/27/2023 3:46 PM

## 2023-02-27 NOTE — ED Notes (Signed)
Had to waste ativan because Dr Estell Harpin cancelled the order, I had already pulled the medication up. Had to waste 4 mg of ativan.

## 2023-02-27 NOTE — ED Provider Notes (Signed)
Emergency Medicine Observation Re-evaluation Note  [name] is a 57 y.o. female, seen on rounds today.  Pt initially presented to the ED for complaints of Mental Health Problem Currently, the patient is awaiting behavioral health consult  Physical Exam  BP 131/77 (BP Location: Right Arm)   Pulse (!) 105   Temp 98.2 F (36.8 C) (Oral)   Resp 18   Ht 5\' 6"  (1.676 m)   Wt 117.9 kg   SpO2 95%   BMI 41.97 kg/m  Physical Exam Alert in moderate distress  ED Course / MDM  EKG:   I have reviewed the labs performed to date as well as medications administered while in observation.  Recent changes in the last 24 hours include none.  Plan  Current plan is for psychiatric placement after behavioral health consult.    Bethann Berkshire, MD 02/27/23 1153
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
## 2023-02-27 NOTE — ED Notes (Signed)
Pt is cooperative and calm with staff however pt is non-stop talking to herself and getting herself upset. This has been going on for a couple of hours. Zyprexa given to help calm pt down
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
## 2023-02-27 NOTE — ED Notes (Signed)
Pt in room hallucinating/arguing with "them"

## 2023-02-27 NOTE — Consult Note (Signed)
Patient chart reviewed for medication management. It appears patient had similar presentation for possible new onset psychosis in November. However, it appears she had a UTI, was treated for said UTI, and returned to baseline functioning quickly.   Her current urinalysis results are quite similar to her UTI in November. Pt is presenting with psychotic symptoms again as well. Pt was started on Keflex today, will resume home medications and hopeful she will return to baseline functioning as UTI is being resolved. It is much more likely this psychotic presentation is induced by UTI vs. new onset psychosis at 57 years old.   In the mean time will continue to recommend for inpatient psychiatric treatment in the case this is not UTI induced, as she would not be safe discharging home at this moment. Psychiatry will continue to follow up with the patient daily to assess need for IP tx.
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
## 2023-02-28 DIAGNOSIS — F312 Bipolar disorder, current episode manic severe with psychotic features: Secondary | ICD-10-CM

## 2023-02-28 DIAGNOSIS — F23 Brief psychotic disorder: Secondary | ICD-10-CM | POA: Diagnosis not present

## 2023-02-28 LAB — SARS CORONAVIRUS 2 BY RT PCR: SARS Coronavirus 2 by RT PCR: NEGATIVE

## 2023-02-28 LAB — HEMOGLOBIN A1C
Hgb A1c MFr Bld: 10.3 % — ABNORMAL HIGH (ref 4.8–5.6)
Mean Plasma Glucose: 249 mg/dL

## 2023-02-28 LAB — URINE CULTURE

## 2023-02-28 LAB — CBG MONITORING, ED
Glucose-Capillary: 295 mg/dL — ABNORMAL HIGH (ref 70–99)
Glucose-Capillary: 329 mg/dL — ABNORMAL HIGH (ref 70–99)
Glucose-Capillary: 227 mg/dL — ABNORMAL HIGH (ref 70–99)

## 2023-02-28 NOTE — ED Notes (Signed)
Pt received dinner tray.

## 2023-02-28 NOTE — Progress Notes (Signed)
Pt was accepted to [facility] TODAY 02/28/2023. Bed assignment: Oak Unit  Pt meets inpatient criteria per Hillery Jacks, NP  Attending Physician will be Joylene Igo, AGPCNP  Report can be called to: [phone]  Bed is ready now (please wait to call report until transport is here to pick up patient)  Care Team Notified: Hillery Jacks, NP and Arther Abbott, RN  [place], [place]  02/28/2023 2:35 PM

## 2023-02-28 NOTE — Progress Notes (Signed)
LCSW Progress Note  [id number]   DYNASTI NETZER  02/28/2023  12:07 PM  Description:   Inpatient Psychiatric Referral   Patient was recommended inpatient per Hillery Jacks, NP. There [facility], per [facility] Rona Ravens, RN. Patient was referred to the following out of network facilities:    [facility] Provider Address Phone Fax  [facility]  [address] [place] [address] [phone]  [facility]  [address] [place] [address] [phone]  [facility]  [address], [place] [address] [phone]  [facility] [place]  [address] [place], [place] [address] [phone]  [facility] [address] [place]  [address] [place] [address] [phone]  [facility]  [address], [place] [address] [phone]  [facility]  [address] [place] [address] [phone]  [facility]  [address] [place] [address] [phone]  [facility]  [address] [place] [place] [address] [phone]  [facility]  [address] [place] [address] [phone]  [facility]  [address] [place] [address] [phone]  [facility]  [address], [place] [address] [phone]  [facility]  [address], [place] [address] [phone]  [facility]  [address] [place] [address] [phone]  [facility]  [address], [place] [address] [phone]  [facility]  [address],  [place] [address] [phone]  [facility]  [address] [place] [address] [phone]  [facility]  [address] [place] [address] [phone]  [facility]  [address] [place], [place] [address] [phone]  [facility]  [address] [place], [place] [address] [phone]  [facility]  [address] [place] [address] [phone]  [facility]  [address], [place] [address] [phone]  [facility]  [address] [place] [address] [phone]  [facility]  [address], [place] [address] [phone]  [facility]  [address] [place], [place] [address] [phone]  [facility] [place] [address] [phone]  [facility]  [address] [place], [place] [address] [phone]  [facility]  [address] [place] [address] [phone]  [facility]  [address] [place] [address] [phone]       Gillermo Murdoch  02/28/2023 12:07 PM

## 2023-02-28 NOTE — ED Notes (Signed)
Pt in room still hallucinating/arguing with someone not present in room. Pt yelling and pointing to the corner of the room. "Just shut up and stop talking about me"
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
## 2023-02-28 NOTE — ED Notes (Signed)
EMS here to transport patient to accepting facility, VS taken patient is in stable condition, verbal report given to receiving nurse.

## 2023-02-28 NOTE — Progress Notes (Cosign Needed Addendum)
[facility] Psych ED Progress Note  02/28/2023 11:44 AM [name]  MRN:  [id number]   Method of visit?: Virtual (Location of provider: [facility] Location of patient: [phone] Names and roles of anyone participating in the consult/assessment:   This service was provided via telemedicine using a 2-way, interactive audio, and video technology. )  Subjective:  [name] 57 year old female presented due to acute psychosis. Patient is currently under involuntary commitment.   She was seen and evaluated via video telehealth.  [name] states that she is ready to discharge.  States she did not want to speak to this provider.  Attempted to engage patient however, she reports " I am tired of talking to this computer I have been here for 3 days I want to go home" [name] was asked who she currently resides with.  Patient stated "none of your business."  Patient presents irritable and agitated.    Chart review, documented that patient continues to respond to internal stimuli.  Was reported that patient has been medication compliant. [name] was recently restarted on Keflex due to documented UTI per notes on 6/4.  Patient is currently prescribed Prozac 20 mg and Seroquel 50 mg nightly.  With agitation orders for Zyprexa 5 mg p.o. twice daily as needed.  CSW to continue seeking inpatient admission.   Per initial admission assessment note: " Patient has been off mental health medications for about 2 months and is acting erratic and manic in triage, easily agitated and irritable. Hx includes manic depression, seizures, DM2 (out of insulin). Attempted to schedule MH appointment but earliest available was 2 months out so they recommended ER to get back on meds."     Principal Problem: Bipolar disorder, current episode manic severe with psychotic features (HCC) Diagnosis:  Principal Problem:   Bipolar disorder, current episode manic severe with psychotic features (HCC) Active Problems:   Morbid obesity  with BMI of 40.0-44.9, adult (HCC)  Total Time spent with patient: 15 minutes  Past Psychiatric History:   Past Medical History:  Past Medical History:  Diagnosis Date   Agitation 07/28/2022   Anxiety    COPD (chronic obstructive pulmonary disease) (HCC)    Diabetes mellitus    Fear of    falling   Hemophilia (HCC)    Hypertension    Panic attacks    Parkinson's disease    Seizures (HCC)     Past Surgical History:  Procedure Laterality Date   ABDOMINAL HYSTERECTOMY     OOPHORECTOMY     SHOULDER CLOSED REDUCTION Right 04/22/2021   Procedure: CLOSED REDUCTION SHOULDER;  Surgeon: Vickki Hearing, MD;  Location: [facility];  Service: Orthopedics;  Laterality: Right;   SHOULDER CLOSED REDUCTION Right 04/25/2021   Procedure: CLOSED REDUCTION SHOULDER;  Surgeon: Vickki Hearing, MD;  Location: [facility];  Service: Orthopedics;  Laterality: Right;   Family History: History reviewed. No pertinent family history. Family Psychiatric  History:  Social History:  Social History   Substance and Sexual Activity  Alcohol Use No     Social History   Substance and Sexual Activity  Drug Use No    Social History   Socioeconomic History   Marital status: Married    Spouse name: Not on file   Number of children: Not on file   Years of education: Not on file   Highest education level: Not on file  Occupational History   Not on file  Tobacco Use   Smoking status: Never  Smokeless tobacco: Never  Substance and Sexual Activity   Alcohol use: No   Drug use: No   Sexual activity: Yes    Birth control/protection: Surgical  Other Topics Concern   Not on file  Social History Narrative   Not on file   Social Determinants of Health   Financial Resource Strain: Not on file  Food Insecurity: Not on file  Transportation Needs: Not on file  Physical Activity: Not on file  Stress: Not on file  Social Connections: Not on file    Sleep: NA  Appetite:  NA  Current  Medications: Current Facility-Administered Medications  Medication Dose Route Frequency Provider Last Rate Last Admin   cephALEXin (KEFLEX) capsule 500 mg  500 mg Oral Q8H Sabas Sous, MD   500 mg at 02/28/23 0743   FLUoxetine (PROZAC) capsule 20 mg  20 mg Oral Daily Eligha Bridegroom, NP   20 mg at 02/28/23 0921   insulin aspart (novoLOG) injection 0-20 Units  0-20 Units Subcutaneous TID WC Bethann Berkshire, MD   11 Units at 02/28/23 0855   insulin aspart (novoLOG) injection 0-5 Units  0-5 Units Subcutaneous QHS Bethann Berkshire, MD   3 Units at 02/27/23 2322   insulin aspart (novoLOG) injection 6 Units  6 Units Subcutaneous TID WC Bethann Berkshire, MD   6 Units at 02/28/23 0857   insulin glargine-yfgn (SEMGLEE) injection 50 Units  50 Units Subcutaneous QHS Bethann Berkshire, MD   50 Units at 02/27/23 2323   metFORMIN (GLUCOPHAGE) tablet 500 mg  500 mg Oral BID WC Benjiman Core, MD   500 mg at 02/28/23 0740   OLANZapine (ZYPREXA) injection 10 mg  10 mg Intramuscular Q8H PRN Eligha Bridegroom, NP       OLANZapine (ZYPREXA) tablet 5 mg  5 mg Oral Q8H PRN Eligha Bridegroom, NP   5 mg at 02/27/23 2322   QUEtiapine (SEROQUEL) tablet 50 mg  50 mg Oral QHS Eligha Bridegroom, NP   50 mg at 02/27/23 2323   Current Outpatient Medications  Medication Sig Dispense Refill   aspirin EC 81 MG tablet Take 81 mg by mouth every morning.     insulin aspart (NOVOLOG) 100 UNIT/ML injection CBG < 70: implement hypoglycemia protocol CBG 70 - 120: 0 units CBG 121 - 150: 3 units CBG 151 - 200: 4 units CBG 201 - 250: 7 units CBG 251 - 300: 11 units CBG 301 - 350: 15 units CBG 351 - 400: 20 units (Patient taking differently: Inject 3-20 Units into the skin 3 (three) times daily with meals. CBG < 70: implement hypoglycemia protocol CBG 70 - 120: 0 units CBG 121 - 150: 3 units      CBG 151 - 200: 4 units CBG 201 - 250: 7 units CBG 251 - 300: 11 units CBG 301 - 350: 15 units CBG 351 - 400: 20 units) 10 mL 2    isosorbide mononitrate (IMDUR) 30 MG 24 hr tablet Take 1 tablet (30 mg total) by mouth daily. 30 tablet 2   LANTUS SOLOSTAR 100 UNIT/ML Solostar Pen Inject 45 Units into the skin 2 (two) times daily.     lisinopril (ZESTRIL) 2.5 MG tablet Take 2.5 mg by mouth daily.     loratadine (CLARITIN) 10 MG tablet Take 10 mg by mouth daily.     metFORMIN (GLUCOPHAGE) 500 MG tablet Take 1 tablet (500 mg total) by mouth 2 (two) times daily with a meal. 60 tablet 0   MYRBETRIQ 25 MG TB24  tablet Take 25 mg by mouth daily.     propranolol (INDERAL) 60 MG tablet Take 1 tablet (60 mg total) by mouth daily. On an empty stomach 30 tablet 0   rosuvastatin (CRESTOR) 20 MG tablet Take 20 mg by mouth daily.      Lab Results:  Results for orders placed or performed during the hospital encounter of 02/26/23 (from the past 48 hour(s))  Comprehensive metabolic panel     Status: Abnormal   Collection Time: 02/26/23  4:20 PM  Result Value Ref Range   Sodium 134 (L) 135 - 145 mmol/L   Potassium 4.5 3.5 - 5.1 mmol/L   Chloride 98 98 - 111 mmol/L   CO2 25 22 - 32 mmol/L   Glucose, Bld 320 (H) 70 - 99 mg/dL    Comment: Glucose reference range applies only to samples taken after fasting for at least 8 hours.   BUN 35 (H) 6 - 20 mg/dL   Creatinine, Ser 4.54 (H) 0.44 - 1.00 mg/dL   Calcium 9.4 8.9 - 09.8 mg/dL   Total Protein 7.2 6.5 - 8.1 g/dL   Albumin 3.8 3.5 - 5.0 g/dL   AST 17 15 - 41 U/L   ALT 15 0 - 44 U/L   Alkaline Phosphatase 94 38 - 126 U/L   Total Bilirubin 0.7 0.3 - 1.2 mg/dL   GFR, Estimated 50 (L) >60 mL/min    Comment: (NOTE) Calculated using the CKD-EPI Creatinine Equation (2021)    Anion gap 11 5 - 15    Comment: Performed at [facility], [address], [place], [place] [address]  Ethanol     Status: None   Collection Time: 02/26/23  4:20 PM  Result Value Ref Range   Alcohol, Ethyl (B) <10 <10 mg/dL    Comment: (NOTE) Lowest detectable limit for serum alcohol is 10 mg/dL.  For medical  purposes only. Performed at [facility], [address] [place], [place] [address]   Salicylate level     Status: Abnormal   Collection Time: 02/26/23  4:20 PM  Result Value Ref Range   Salicylate Lvl <7.0 (L) 7.0 - 30.0 mg/dL    Comment: Performed at [facility], [address], [place], [place] [address]  Acetaminophen level     Status: Abnormal   Collection Time: 02/26/23  4:20 PM  Result Value Ref Range   Acetaminophen (Tylenol), Serum <10 (L) 10 - 30 ug/mL    Comment: (NOTE) Therapeutic concentrations vary significantly. A range of 10-30 ug/mL  may be an effective concentration for many patients. However, some  are best treated at concentrations outside of this range. Acetaminophen concentrations >150 ug/mL at 4 hours after ingestion  and >50 ug/mL at 12 hours after ingestion are often associated with  toxic reactions.  Performed at [facility], [address] [place], [place] [address]   cbc     Status: None   Collection Time: 02/26/23  4:20 PM  Result Value Ref Range   WBC 5.9 4.0 - 10.5 K/uL   RBC 4.81 3.87 - 5.11 MIL/uL   Hemoglobin 13.6 12.0 - 15.0 g/dL   HCT 46.9 62.9 - 52.8 %   MCV 88.8 80.0 - 100.0 fL   MCH 28.3 26.0 - 34.0 pg   MCHC 31.9 30.0 - 36.0 g/dL   RDW 41.3 24.4 - 01.0 %   Platelets 220 150 - 400 K/uL   nRBC 0.0 0.0 - 0.2 %    Comment: Performed at [facility], [address] [place], [place] [address]  Hemoglobin A1c     Status: Abnormal   Collection Time: 02/26/23  4:20 PM  Result Value Ref Range   Hgb A1c MFr Bld 10.3 (H) 4.8 - 5.6 %    Comment: (NOTE)         Prediabetes: 5.7 - 6.4         Diabetes: >6.4         Glycemic control for adults with diabetes: <7.0    Mean Plasma Glucose 249 mg/dL    Comment: (NOTE) Performed At: [facility] [address] [place], [place] [id number] Jolene Schimke MD XB:[id number]   CBG monitoring, ED     Status: Abnormal   Collection Time: 02/26/23 11:46 PM  Result Value Ref Range    Glucose-Capillary 279 (H) 70 - 99 mg/dL    Comment: Glucose reference range applies only to samples taken after fasting for at least 8 hours.  Rapid urine drug screen (hospital performed)     Status: None   Collection Time: 02/27/23  2:58 AM  Result Value Ref Range   Opiates NONE DETECTED NONE DETECTED   Cocaine NONE DETECTED NONE DETECTED   Benzodiazepines NONE DETECTED NONE DETECTED   Amphetamines NONE DETECTED NONE DETECTED   Tetrahydrocannabinol NONE DETECTED NONE DETECTED   Barbiturates NONE DETECTED NONE DETECTED    Comment: (NOTE) DRUG SCREEN FOR MEDICAL PURPOSES ONLY.  IF CONFIRMATION IS NEEDED FOR ANY PURPOSE, NOTIFY LAB WITHIN 5 DAYS.  LOWEST DETECTABLE LIMITS FOR URINE DRUG SCREEN Drug Class                     Cutoff (ng/mL) Amphetamine and metabolites    1000 Barbiturate and metabolites    200 Benzodiazepine                 200 Opiates and metabolites        300 Cocaine and metabolites        300 THC                            50 Performed at [facility], [address], [place], [place] [address]   Urinalysis, w/ Reflex to Culture (Infection Suspected) -Urine, Unspecified Source     Status: Abnormal   Collection Time: 02/27/23  2:58 AM  Result Value Ref Range   Specimen Source URINE, CATHETERIZED     Comment: CORRECTED ON 06/04 AT 0335: PREVIOUSLY REPORTED AS URINE, UNSPE   Color, Urine YELLOW YELLOW   APPearance HAZY (A) CLEAR   Specific Gravity, Urine 1.016 1.005 - 1.030   pH 5.0 5.0 - 8.0   Glucose, UA 50 (A) NEGATIVE mg/dL   Hgb urine dipstick SMALL (A) NEGATIVE   Bilirubin Urine NEGATIVE NEGATIVE   Ketones, ur NEGATIVE NEGATIVE mg/dL   Protein, ur >=784 (A) NEGATIVE mg/dL   Nitrite NEGATIVE NEGATIVE   Leukocytes,Ua MODERATE (A) NEGATIVE   RBC / HPF 0-5 0 - 5 RBC/hpf   WBC, UA >50 0 - 5 WBC/hpf    Comment:        Reflex urine culture not performed if WBC <=10, OR if Squamous epithelial cells >5. If Squamous epithelial cells >5 suggest  recollection.    Bacteria, UA MANY (A) NONE SEEN   Squamous Epithelial / HPF 0-5 0 - 5 /HPF   WBC Clumps PRESENT    Mucus PRESENT     Comment: Performed at [facility], [address] [place], [place] [address]  Urine Culture     Status: Abnormal (Preliminary result)   Collection Time: 02/27/23  2:58 AM   Specimen: Urine, Random  Result Value Ref Range   Specimen Description      URINE, RANDOM Performed at [facility], [address], [place], [place] [address]    Special Requests URINE, RANDOM    Culture (A)     >=100,000 COLONIES/mL ESCHERICHIA COLI SUSCEPTIBILITIES TO FOLLOW Performed at [facility], [address] [place], [place] [address]    Report Status PENDING   CBG monitoring, ED     Status: Abnormal   Collection Time: 02/27/23  8:42 AM  Result Value Ref Range   Glucose-Capillary 293 (H) 70 - 99 mg/dL    Comment: Glucose reference range applies only to samples taken after fasting for at least 8 hours.  CBG monitoring, ED     Status: Abnormal   Collection Time: 02/27/23 11:48 AM  Result Value Ref Range   Glucose-Capillary 435 (H) 70 - 99 mg/dL    Comment: Glucose reference range applies only to samples taken after fasting for at least 8 hours.  CBG monitoring, ED     Status: Abnormal   Collection Time: 02/27/23  1:41 PM  Result Value Ref Range   Glucose-Capillary 389 (H) 70 - 99 mg/dL    Comment: Glucose reference range applies only to samples taken after fasting for at least 8 hours.  CBG monitoring, ED     Status: Abnormal   Collection Time: 02/27/23  7:18 PM  Result Value Ref Range   Glucose-Capillary 344 (H) 70 - 99 mg/dL    Comment: Glucose reference range applies only to samples taken after fasting for at least 8 hours.  CBG monitoring, ED     Status: Abnormal   Collection Time: 02/27/23  9:56 PM  Result Value Ref Range   Glucose-Capillary 266 (H) 70 - 99 mg/dL    Comment: Glucose reference range applies only to samples taken after fasting for at  least 8 hours.  SARS Coronavirus 2 by RT PCR (hospital order, performed in [facility] hospital lab) *cepheid single result test* Anterior Nasal Swab     Status: None   Collection Time: 02/28/23 12:40 AM   Specimen: Anterior Nasal Swab  Result Value Ref Range   SARS Coronavirus 2 by RT PCR NEGATIVE NEGATIVE    Comment: (NOTE) SARS-CoV-2 target nucleic acids are NOT DETECTED.  The SARS-CoV-2 RNA is generally detectable in upper and lower respiratory specimens during the acute phase of infection. The lowest concentration of SARS-CoV-2 viral copies this assay can detect is 250 copies / mL. A negative result does not preclude SARS-CoV-2 infection and should not be used as the sole basis for treatment or other patient management decisions.  A negative result may occur with improper specimen collection / handling, submission of specimen other than nasopharyngeal swab, presence of viral mutation(s) within the areas targeted by this assay, and inadequate number of viral copies (<250 copies / mL). A negative result must be combined with clinical observations, patient history, and epidemiological information.  Fact Sheet for Patients:   RoadLapTop.co.za  Fact Sheet for Healthcare Providers: http://kim-miller.com/  This test is not yet approved or  cleared by the [place] FDA and has been authorized for detection and/or diagnosis of SARS-CoV-2 by FDA under an Emergency Use Authorization (EUA).  This EUA will remain in effect (meaning this test can be used) for the  duration of the COVID-19 declaration under Section 564(b)(1) of the Act, 21 U.S.C. section 360bbb-3(b)(1), unless the authorization is terminated or revoked sooner.  Performed at [facility], [address], [place], [place] [address]   CBG monitoring, ED     Status: Abnormal   Collection Time: 02/28/23  7:53 AM  Result Value Ref Range   Glucose-Capillary 295 (H) 70 - 99 mg/dL     Comment: Glucose reference range applies only to samples taken after fasting for at least 8 hours.  CBG monitoring, ED     Status: Abnormal   Collection Time: 02/28/23 11:31 AM  Result Value Ref Range   Glucose-Capillary 329 (H) 70 - 99 mg/dL    Comment: Glucose reference range applies only to samples taken after fasting for at least 8 hours.    Blood Alcohol level:  Lab Results  Component Value Date   [facility] <10 02/26/2023   ETH <10 07/27/2022    Physical Findings: AIMS:  , ,  ,  ,    CIWA:    COWS:     Musculoskeletal: Tele-assessment   Psychiatric Specialty Exam:  Presentation  General Appearance:  Disheveled  Eye Contact: Fleeting  Speech: Garbled; Other (comment) (yelling, loud)  Speech Volume: Increased  Handedness:No data recorded  Mood and Affect  Mood: Angry; Labile  Affect: Labile   Thought Process  Thought Processes: Disorganized  Descriptions of Associations:-- (Unable to assess at this time)  Orientation:Full (Time, Place and Person)  Thought Content:Illogical; Paranoid Ideation  History of Schizophrenia/Schizoaffective disorder:-- (Unknown at this time)  Duration of Psychotic Symptoms:-- (Unknown at this time)  Hallucinations:No data recorded Ideas of Reference:Delusions; Paranoia  Suicidal Thoughts:No data recorded Homicidal Thoughts:No data recorded  Sensorium  Memory: Immediate Poor  Judgment: Poor  Insight: Poor   Executive Functions  Concentration: Poor  Attention Span: -- (unable to assess at this time)  Recall: -- (unable to assess at this time)  Fund of Knowledge: Poor  Language: Fair   Psychomotor Activity  Psychomotor Activity:No data recorded  Assets  Assets: Other (comment) (None at this time)   Sleep  Sleep:No data recorded   Physical Exam: Physical Exam Vitals and nursing note reviewed.  Neurological:     Mental Status: She is alert.    Review of Systems   Psychiatric/Behavioral:  Positive for hallucinations. The patient is nervous/anxious.   All other systems reviewed and are negative.  Blood pressure 121/74, pulse (!) 116, temperature 98.5 F (36.9 C), temperature source Oral, resp. rate 20, height 5\' 6"  (1.676 m), weight 117.9 kg, SpO2 93 %. Body mass index is 41.97 kg/m.  Treatment Plan Summary: Plan Continue seeking inpatient admission  Oneta Rack, NP 02/28/2023, 11:44 AM

## 2023-02-28 NOTE — ED Provider Notes (Signed)
Patient has been accepted for inpatient behavioral health at [facility].  Accepted by [facility].  EMTALA will be completed   Vanetta Mulders, MD 02/28/23 [phone]

## 2023-02-28 NOTE — ED Notes (Signed)
Pt received lunch tray 

## 2023-02-28 NOTE — ED Notes (Signed)
Pt received breakfast tray 

## 2023-02-28 NOTE — ED Notes (Signed)
EDP made aware pt with one watery stool per sitter. Will continue to monitor for continued watery stools.

## 2023-02-28 NOTE — Inpatient Diabetes Management (Signed)
Inpatient Diabetes Program Recommendations  AACE/ADA: New Consensus Statement on Inpatient Glycemic Control   Target Ranges:  Prepandial:   less than 140 mg/dL      Peak postprandial:   less than 180 mg/dL (1-2 hours)      Critically ill patients:  140 - 180 mg/dL    Latest Reference Range & Units 02/27/23 08:42 02/27/23 11:48 02/27/23 13:41 02/27/23 19:18 02/27/23 21:56 02/28/23 07:53  Glucose-Capillary 70 - 99 mg/dL 130 (H) 865 (H) 784 (H) 344 (H) 266 (H) 295 (H)   Review of Glycemic Control  Diabetes history: DM2 Outpatient Diabetes medications:  Lantus 45 units BID, Novolog 0-20 units, Victoza 1.8 mg daily, Metformin 500 mg BID Current orders for Inpatient glycemic control: Semglee 50 units QHS, Metformin 500 mg BID, Novolog 0-20 units TID with meals, Novolog 0-5 units QHS  Inpatient Diabetes Program Recommendations:    Insulin: Please consider adding Semglee 20 units QAM and increasing meal coverage to Novolog 10 units TID with meals.  Thanks, Orlando Penner, RN, MSN, CDCES Diabetes Coordinator Inpatient Diabetes Program [phone] (Team Pager from 8am to 5pm)

## 2023-02-28 NOTE — ED Notes (Addendum)
Pt denies si/hi/avh but pt having intermittent conversations with someone in room that is not there. Pt denies pain/shob or weakness. Pt states she is ready to go home. Pt updated. Sitter at bedside.

## 2023-03-01 LAB — URINE CULTURE: Culture: >=100000 — AB

## 2023-03-02 ENCOUNTER — Telehealth (HOSPITAL_BASED_OUTPATIENT_CLINIC_OR_DEPARTMENT_OTHER): Payer: Self-pay | Admitting: *Deleted

## 2023-03-02 NOTE — Telephone Encounter (Signed)
Post ED Visit - Positive Culture Follow-up  Culture report reviewed by antimicrobial stewardship pharmacist: [facility] []  Andreas Ohm, Pharm.D. []  Celedonio Miyamoto, Pharm.D., BCPS AQ-ID []  Garvin Fila, Pharm.D., BCPS []  Georgina Pillion, Pharm.D., BCPS []  [place], [address].D., BCPS, AAHIVP []  Estella Husk, Pharm.D., BCPS, AAHIVP []  Lysle Pearl, PharmD, BCPS []  Phillips Climes, PharmD, BCPS []  Agapito Games, PharmD, BCPS []  Verlan Friends, PharmD []  Mervyn Gay, PharmD, BCPS []  Vinnie Level, PharmD  [facility] []  Len Childs, PharmD []  Greer Pickerel, PharmD []  Adalberto Cole, PharmD []  Perlie Gold, Rph []  Lonell Face) Jean Rosenthal, PharmD []  Earl Many, PharmD []  Junita Push, PharmD []  Dorna Leitz, PharmD []  Terrilee Files, PharmD []  Lynann Beaver, PharmD []  Keturah Barre, PharmD []  Loralee Pacas, PharmD []  Bernadene Person, PharmD   Positive urine culture [facility], do not treat and no further patient follow-up is required at this time.  Lurena Nida, PA  Virl Axe Talley 03/02/2023, 9:12 AM

## 2023-03-30 ENCOUNTER — Inpatient Hospital Stay (HOSPITAL_COMMUNITY)
Admission: EM | Admit: 2023-03-30 | Discharge: 2023-04-05 | DRG: 189 | Disposition: A | Discharge status: Home Health | Payer: Medicaid Other | Attending: Internal Medicine | Admitting: Internal Medicine

## 2023-03-30 ENCOUNTER — Other Ambulatory Visit: Payer: Self-pay

## 2023-03-30 ENCOUNTER — Emergency Department (HOSPITAL_COMMUNITY): Payer: Medicaid Other

## 2023-03-30 ENCOUNTER — Encounter (HOSPITAL_COMMUNITY): Payer: Self-pay | Admitting: Emergency Medicine

## 2023-03-30 DIAGNOSIS — I10 Essential (primary) hypertension: Secondary | ICD-10-CM | POA: Diagnosis present

## 2023-03-30 DIAGNOSIS — J449 Chronic obstructive pulmonary disease, unspecified: Secondary | ICD-10-CM | POA: Diagnosis present

## 2023-03-30 DIAGNOSIS — Z9981 Dependence on supplemental oxygen: Secondary | ICD-10-CM | POA: Diagnosis not present

## 2023-03-30 DIAGNOSIS — Z602 Problems related to living alone: Secondary | ICD-10-CM | POA: Diagnosis present

## 2023-03-30 DIAGNOSIS — I5031 Acute diastolic (congestive) heart failure: Secondary | ICD-10-CM | POA: Diagnosis not present

## 2023-03-30 DIAGNOSIS — Z794 Long term (current) use of insulin: Secondary | ICD-10-CM

## 2023-03-30 DIAGNOSIS — N1831 Chronic kidney disease, stage 3a: Secondary | ICD-10-CM | POA: Diagnosis present

## 2023-03-30 DIAGNOSIS — G40909 Epilepsy, unspecified, not intractable, without status epilepticus: Secondary | ICD-10-CM | POA: Diagnosis present

## 2023-03-30 DIAGNOSIS — Z7984 Long term (current) use of oral hypoglycemic drugs: Secondary | ICD-10-CM | POA: Diagnosis not present

## 2023-03-30 DIAGNOSIS — E662 Morbid (severe) obesity with alveolar hypoventilation: Secondary | ICD-10-CM | POA: Diagnosis present

## 2023-03-30 DIAGNOSIS — J9621 Acute and chronic respiratory failure with hypoxia: Principal | ICD-10-CM | POA: Diagnosis present

## 2023-03-30 DIAGNOSIS — G20A1 Parkinson's disease without dyskinesia, without mention of fluctuations: Secondary | ICD-10-CM | POA: Diagnosis present

## 2023-03-30 DIAGNOSIS — R4182 Altered mental status, unspecified: Secondary | ICD-10-CM | POA: Diagnosis not present

## 2023-03-30 DIAGNOSIS — E1122 Type 2 diabetes mellitus with diabetic chronic kidney disease: Secondary | ICD-10-CM | POA: Diagnosis present

## 2023-03-30 DIAGNOSIS — F319 Bipolar disorder, unspecified: Secondary | ICD-10-CM | POA: Diagnosis present

## 2023-03-30 DIAGNOSIS — N189 Chronic kidney disease, unspecified: Secondary | ICD-10-CM | POA: Diagnosis not present

## 2023-03-30 DIAGNOSIS — Z90710 Acquired absence of both cervix and uterus: Secondary | ICD-10-CM

## 2023-03-30 DIAGNOSIS — J9622 Acute and chronic respiratory failure with hypercapnia: Secondary | ICD-10-CM | POA: Diagnosis present

## 2023-03-30 DIAGNOSIS — E875 Hyperkalemia: Secondary | ICD-10-CM | POA: Diagnosis present

## 2023-03-30 DIAGNOSIS — E782 Mixed hyperlipidemia: Secondary | ICD-10-CM | POA: Diagnosis present

## 2023-03-30 DIAGNOSIS — D66 Hereditary factor VIII deficiency: Secondary | ICD-10-CM | POA: Diagnosis present

## 2023-03-30 DIAGNOSIS — I5033 Acute on chronic diastolic (congestive) heart failure: Secondary | ICD-10-CM | POA: Diagnosis present

## 2023-03-30 DIAGNOSIS — Z7982 Long term (current) use of aspirin: Secondary | ICD-10-CM

## 2023-03-30 DIAGNOSIS — Z6841 Body Mass Index (BMI) 40.0 and over, adult: Secondary | ICD-10-CM

## 2023-03-30 DIAGNOSIS — I130 Hypertensive heart and chronic kidney disease with heart failure and stage 1 through stage 4 chronic kidney disease, or unspecified chronic kidney disease: Secondary | ICD-10-CM | POA: Diagnosis present

## 2023-03-30 DIAGNOSIS — Z79899 Other long term (current) drug therapy: Secondary | ICD-10-CM | POA: Diagnosis not present

## 2023-03-30 DIAGNOSIS — I2489 Other forms of acute ischemic heart disease: Secondary | ICD-10-CM | POA: Diagnosis present

## 2023-03-30 DIAGNOSIS — E1165 Type 2 diabetes mellitus with hyperglycemia: Secondary | ICD-10-CM | POA: Diagnosis present

## 2023-03-30 DIAGNOSIS — Z90722 Acquired absence of ovaries, bilateral: Secondary | ICD-10-CM

## 2023-03-30 DIAGNOSIS — Z91030 Bee allergy status: Secondary | ICD-10-CM

## 2023-03-30 DIAGNOSIS — J9601 Acute respiratory failure with hypoxia: Secondary | ICD-10-CM | POA: Diagnosis present

## 2023-03-30 DIAGNOSIS — R7989 Other specified abnormal findings of blood chemistry: Secondary | ICD-10-CM | POA: Insufficient documentation

## 2023-03-30 DIAGNOSIS — N179 Acute kidney failure, unspecified: Secondary | ICD-10-CM | POA: Diagnosis present

## 2023-03-30 DIAGNOSIS — J9602 Acute respiratory failure with hypercapnia: Secondary | ICD-10-CM | POA: Diagnosis not present

## 2023-03-30 LAB — BLOOD GAS, VENOUS
Acid-Base Excess: 2 mmol/L (ref 0.0–2.0)
Bicarbonate: 30.8 mmol/L — ABNORMAL HIGH (ref 20.0–28.0)
Drawn by: 7049
O2 Saturation: 76 %
Patient temperature: 37.4
pCO2, Ven: 68 mmHg — ABNORMAL HIGH (ref 44–60)
pH, Ven: 7.26 (ref 7.25–7.43)
pO2, Ven: 46 mmHg — ABNORMAL HIGH (ref 32–45)

## 2023-03-30 LAB — CBC
HCT: 36.5 % (ref 36.0–46.0)
Hemoglobin: 10.7 g/dL — ABNORMAL LOW (ref 12.0–15.0)
MCH: 27.9 pg (ref 26.0–34.0)
MCHC: 29.3 g/dL — ABNORMAL LOW (ref 30.0–36.0)
MCV: 95.3 fL (ref 80.0–100.0)
Platelets: 252 10*3/uL (ref 150–400)
RBC: 3.83 MIL/uL — ABNORMAL LOW (ref 3.87–5.11)
RDW: 14.9 % (ref 11.5–15.5)
WBC: 6.7 10*3/uL (ref 4.0–10.5)
nRBC: 1.2 % — ABNORMAL HIGH (ref 0.0–0.2)

## 2023-03-30 LAB — GLUCOSE, CAPILLARY: Glucose-Capillary: 309 mg/dL — ABNORMAL HIGH (ref 70–99)

## 2023-03-30 LAB — BASIC METABOLIC PANEL
Anion gap: 9 (ref 5–15)
BUN: 60 mg/dL — ABNORMAL HIGH (ref 6–20)
CO2: 27 mmol/L (ref 22–32)
Calcium: 8.4 mg/dL — ABNORMAL LOW (ref 8.9–10.3)
Chloride: 97 mmol/L — ABNORMAL LOW (ref 98–111)
Creatinine, Ser: 2.19 mg/dL — ABNORMAL HIGH (ref 0.44–1.00)
GFR, Estimated: 25 mL/min — ABNORMAL LOW (ref >60)
Glucose, Bld: 288 mg/dL — ABNORMAL HIGH (ref 70–99)
Potassium: 5.2 mmol/L — ABNORMAL HIGH (ref 3.5–5.1)
Sodium: 133 mmol/L — ABNORMAL LOW (ref 135–145)

## 2023-03-30 LAB — BRAIN NATRIURETIC PEPTIDE: B Natriuretic Peptide: 505 pg/mL — ABNORMAL HIGH (ref 0.0–100.0)

## 2023-03-30 LAB — TROPONIN I (HIGH SENSITIVITY)
Troponin I (High Sensitivity): 42 ng/L — ABNORMAL HIGH (ref <18)
Troponin I (High Sensitivity): 37 ng/L — ABNORMAL HIGH (ref <18)

## 2023-03-30 MED ORDER — ACETAMINOPHEN 325 MG PO TABS
650.0000 mg | ORAL_TABLET | Freq: Four times a day (QID) | ORAL | Status: DC | PRN
  Administered 2023-04-01 – 2023-04-03 (×2): 650 mg via ORAL
  Filled 2023-03-30 (×2): qty 2

## 2023-03-30 MED ORDER — FUROSEMIDE 10 MG/ML IJ SOLN
40.0000 mg | Freq: Once | INTRAMUSCULAR | Status: AC
  Administered 2023-03-30: 40 mg via INTRAVENOUS
  Filled 2023-03-30: qty 4

## 2023-03-30 MED ORDER — ONDANSETRON HCL 4 MG PO TABS: 4.0000 mg | ORAL_TABLET | Freq: Four times a day (QID) | ORAL | Status: DC | PRN

## 2023-03-30 MED ORDER — INSULIN ASPART 100 UNIT/ML IJ SOLN
0.0000 [IU] | Freq: Every day | INTRAMUSCULAR | Status: DC
  Administered 2023-03-30: 4 [IU] via SUBCUTANEOUS
  Administered 2023-03-31: 5 [IU] via SUBCUTANEOUS
  Administered 2023-04-01: 4 [IU] via SUBCUTANEOUS
  Administered 2023-04-02: 3 [IU] via SUBCUTANEOUS
  Administered 2023-04-03 – 2023-04-04 (×2): 2 [IU] via SUBCUTANEOUS

## 2023-03-30 MED ORDER — ASPIRIN 81 MG PO TBEC
81.0000 mg | DELAYED_RELEASE_TABLET | Freq: Every day | ORAL | Status: DC
  Administered 2023-03-31 – 2023-04-05 (×6): 81 mg via ORAL
  Filled 2023-03-30 (×6): qty 1

## 2023-03-30 MED ORDER — INSULIN ASPART 100 UNIT/ML IJ SOLN
0.0000 [IU] | Freq: Three times a day (TID) | INTRAMUSCULAR | Status: DC
  Administered 2023-03-31: 20 [IU] via SUBCUTANEOUS
  Administered 2023-03-31: 11 [IU] via SUBCUTANEOUS
  Administered 2023-03-31: 15 [IU] via SUBCUTANEOUS
  Administered 2023-04-01: 20 [IU] via SUBCUTANEOUS
  Administered 2023-04-01 – 2023-04-02 (×3): 11 [IU] via SUBCUTANEOUS
  Administered 2023-04-02: 7 [IU] via SUBCUTANEOUS
  Administered 2023-04-03: 11 [IU] via SUBCUTANEOUS
  Administered 2023-04-03: 7 [IU] via SUBCUTANEOUS
  Administered 2023-04-04: 3 [IU] via SUBCUTANEOUS
  Administered 2023-04-04 (×2): 15 [IU] via SUBCUTANEOUS
  Administered 2023-04-05: 7 [IU] via SUBCUTANEOUS

## 2023-03-30 MED ORDER — ROSUVASTATIN CALCIUM 20 MG PO TABS
20.0000 mg | ORAL_TABLET | Freq: Every day | ORAL | Status: DC
  Administered 2023-03-31 – 2023-04-05 (×6): 20 mg via ORAL
  Filled 2023-03-30 (×6): qty 1

## 2023-03-30 MED ORDER — INSULIN GLARGINE-YFGN 100 UNIT/ML ~~LOC~~ SOLN
15.0000 [IU] | Freq: Every day | SUBCUTANEOUS | Status: DC
  Administered 2023-03-30: 15 [IU] via SUBCUTANEOUS
  Filled 2023-03-30 (×2): qty 0.15

## 2023-03-30 MED ORDER — ONDANSETRON HCL 4 MG/2ML IJ SOLN: 4.0000 mg | Freq: Four times a day (QID) | INTRAMUSCULAR | Status: DC | PRN

## 2023-03-30 MED ORDER — ISOSORBIDE MONONITRATE ER 30 MG PO TB24
30.0000 mg | ORAL_TABLET | Freq: Every day | ORAL | Status: DC
  Administered 2023-03-31 – 2023-04-05 (×6): 30 mg via ORAL
  Filled 2023-03-30 (×6): qty 1

## 2023-03-30 MED ORDER — ACETAMINOPHEN 650 MG RE SUPP: 650.0000 mg | Freq: Four times a day (QID) | RECTAL | Status: DC | PRN

## 2023-03-30 MED ORDER — ENOXAPARIN SODIUM 40 MG/0.4ML IJ SOSY
40.0000 mg | PREFILLED_SYRINGE | INTRAMUSCULAR | Status: DC
  Administered 2023-03-31: 40 mg via SUBCUTANEOUS
  Filled 2023-03-30: qty 0.4

## 2023-03-30 MED ORDER — PROPRANOLOL HCL 20 MG PO TABS
60.0000 mg | ORAL_TABLET | Freq: Every day | ORAL | Status: DC
  Administered 2023-03-31 – 2023-04-05 (×6): 60 mg via ORAL
  Filled 2023-03-30 (×6): qty 3

## 2023-03-30 MED ORDER — METHYLPREDNISOLONE SODIUM SUCC 125 MG IJ SOLR
125.0000 mg | Freq: Once | INTRAMUSCULAR | Status: AC
  Administered 2023-03-30: 125 mg via INTRAVENOUS
  Filled 2023-03-30: qty 2

## 2023-03-30 MED ORDER — IPRATROPIUM-ALBUTEROL 0.5-2.5 (3) MG/3ML IN SOLN
3.0000 mL | Freq: Once | RESPIRATORY_TRACT | Status: AC
  Administered 2023-03-30: 3 mL via RESPIRATORY_TRACT
  Filled 2023-03-30: qty 3

## 2023-03-30 MED ORDER — ALBUTEROL SULFATE HFA 108 (90 BASE) MCG/ACT IN AERS
2.00 | INHALATION_SPRAY | RESPIRATORY_TRACT | Status: DC | PRN
Start: 2023-03-30 — End: 2023-03-30

## 2023-03-30 MED ORDER — ALBUTEROL SULFATE (2.5 MG/3ML) 0.083% IN NEBU: 2.5000 mg | INHALATION_SOLUTION | RESPIRATORY_TRACT | Status: DC | PRN

## 2023-03-30 NOTE — ED Notes (Signed)
Attempted report 

## 2023-03-30 NOTE — ED Triage Notes (Signed)
Pt via [facility] from home c/o SOB. Per EMS, O2 saturation on site was 76% room air, increased to 94% on 4L. Pt is a/o x 4 on arrival. She states she feels lightheaded and that her equilibrium is off. Pt denies pain but feels light palpitations and nausea.   BP 150/78 HR 92 O2 94% on 4L

## 2023-03-30 NOTE — ED Notes (Signed)
Pt wanting to go home. Nurse explained she is able to leave anytime she wants but she needs oxygen right now. Nurse taught pt oxygen comes from concentrator and companies need to bring them. There is no pill to fix the need for oxygen.

## 2023-03-30 NOTE — H&P (Signed)
History and Physical    Patient: [name]:[id number] DOB: 1966-03-02 DOA: 03/30/2023 DOS: the patient was seen and examined on 03/30/2023 PCP: [facility]  Patient coming from: Home  Chief Complaint:  Chief Complaint  Patient presents with   Shortness of Breath   HPI: [name] is a 57 y.o. female with medical history significant of COPD, hypertension, T2DM, hyperlipidemia, hypertension who presents to the emergency department from home via EMS due to shortness of breath which started today.  She complained of fatigue and lightheadedness which was followed with shortness of breath without any known aggravating/alleviating factor.  Patient denies fever, chest pain, chills, nausea, cough, abdominal pain, vomiting, recent leg swelling or increased abdominal girth.  EMS was activated and arrival of EMS team, patient was noted to have an O2 sat of 76% on room air, she was placed on supplemental oxygen and was taken to the ED for further evaluation and management.  ED Course:  In the emergency department, she was hemodynamically stable, O2 sat was 91-93% on supplemental oxygen via [place] at 4 LPM.  Workup in the ED showed WBC 6.7, hemoglobin 10.7, hematocrit 36.5, MCV 95.3, platelets 252.  BMP showed sodium 133, potassium 5.2, chloride 97, bicarb 27, blood glucose 288, BUN 60, creatinine 2.19 (baseline creatinine at 1.2-1.3), EGFR 25.  Troponin 42 > 37, BNP 505 Chest x-ray showed no active disease IV Lasix 40 mg x 1 was given, Solu-Medrol 25 mg x 1 and DuoNeb treatment was provided.  Hospitalist was asked to admit patient for further evaluation and management.  Review of Systems: Review of systems as noted in the HPI. All other systems reviewed and are negative.   Past Medical History:  Diagnosis Date   Agitation 07/28/2022   Anxiety    COPD (chronic obstructive pulmonary disease) (HCC)    Diabetes mellitus    Fear of    falling   Hemophilia (HCC)     Hypertension    Panic attacks    Parkinson's disease    Seizures (HCC)    Past Surgical History:  Procedure Laterality Date   ABDOMINAL HYSTERECTOMY     OOPHORECTOMY     SHOULDER CLOSED REDUCTION Right 04/22/2021   Procedure: CLOSED REDUCTION SHOULDER;  Surgeon: Vickki Hearing, MD;  Location: [facility];  Service: Orthopedics;  Laterality: Right;   SHOULDER CLOSED REDUCTION Right 04/25/2021   Procedure: CLOSED REDUCTION SHOULDER;  Surgeon: Vickki Hearing, MD;  Location: [facility];  Service: Orthopedics;  Laterality: Right;    Social History:  reports that she has never smoked. She has never used smokeless tobacco. She reports that she does not drink alcohol and does not use drugs.   Allergies  Allergen Reactions   Bee Venom Hives    History reviewed. No pertinent family history.   Prior to Admission medications   Medication Sig Start Date End Date Taking? Authorizing Provider  aspirin EC 81 MG tablet Take 81 mg by mouth every morning.    [provider]  insulin aspart (NOVOLOG) 100 UNIT/ML injection CBG < 70: implement hypoglycemia protocol CBG 70 - 120: 0 units CBG 121 - 150: 3 units CBG 151 - 200: 4 units CBG 201 - 250: 7 units CBG 251 - 300: 11 units CBG 301 - 350: 15 units CBG 351 - 400: 20 units Patient taking differently: Inject 3-20 Units into the skin 3 (three) times daily with meals. CBG < 70: implement hypoglycemia protocol CBG 70 -  120: 0 units CBG 121 - 150: 3 units      CBG 151 - 200: 4 units CBG 201 - 250: 7 units CBG 251 - 300: 11 units CBG 301 - 350: 15 units CBG 351 - 400: 20 units 04/20/14   Kathlen Mody, MD  isosorbide mononitrate (IMDUR) 30 MG 24 hr tablet Take 1 tablet (30 mg total) by mouth daily. 07/08/22   Terrilee Files, MD  LANTUS SOLOSTAR 100 UNIT/ML Solostar Pen Inject 45 Units into the skin 2 (two) times daily. 02/13/23   [provider]  lisinopril (ZESTRIL) 2.5 MG tablet Take 2.5 mg by mouth daily. 02/03/23   [provider]  loratadine (CLARITIN) 10 MG tablet Take 10 mg by mouth daily.    [provider]  metFORMIN (GLUCOPHAGE) 500 MG tablet Take 1 tablet (500 mg total) by mouth 2 (two) times daily with a meal. 07/08/22   Terrilee Files, MD  MYRBETRIQ 25 MG TB24 tablet Take 25 mg by mouth daily. 02/03/23   [provider]  propranolol (INDERAL) 60 MG tablet Take 1 tablet (60 mg total) by mouth daily. On an empty stomach 07/08/22   Terrilee Files, MD  rosuvastatin (CRESTOR) 20 MG tablet Take 20 mg by mouth daily. 02/03/23   [provider]    Physical Exam: BP 138/68 (BP Location: Left Arm)   Pulse 84   Temp 99.5 F (37.5 C) (Oral)   Resp 20   Ht 5\' 6"  (1.676 m)   Wt (!) 148.7 kg   SpO2 92%   BMI 52.91 kg/m   General: 57 y.o. year-old female morbidly obese, well developed well nourished in no acute distress.  Alert and oriented x3. HEENT: NCAT, EOMI Neck: Supple, trachea medial Cardiovascular: Regular rate and rhythm with no rubs or gallops.  No thyromegaly or JVD noted.  Bilateral lower extremity edema. 2/4 pulses in all 4 extremities. Respiratory: Bilateral Rales in lower lobes on auscultation.  No rhonchi  Abdomen: Soft, obese, nontender nondistended with normal bowel sounds x4 quadrants. Muskuloskeletal: No cyanosis, clubbing noted bilaterally Neuro: CN II-XII intact, strength 5/5 x 4, sensation, reflexes intact Skin: No ulcerative lesions noted or rashes Psychiatry: Judgement and insight appear normal. Mood is appropriate for condition and setting          Labs on Admission:  Basic Metabolic Panel: Recent Labs  Lab 03/30/23 1810  NA 133*  K 5.2*  CL 97*  CO2 27  GLUCOSE 288*  BUN 60*  CREATININE 2.19*  CALCIUM 8.4*   Liver Function Tests: No results for input(s): "AST", "ALT", "ALKPHOS", "BILITOT", "PROT", "ALBUMIN" in the last 168 hours. No results for input(s): "LIPASE", "AMYLASE" in the last 168 hours. No results for input(s):  "AMMONIA" in the last 168 hours. CBC: Recent Labs  Lab 03/30/23 1810  WBC 6.7  HGB 10.7*  HCT 36.5  MCV 95.3  PLT 252   Cardiac Enzymes: No results for input(s): "CKTOTAL", "CKMB", "CKMBINDEX", "TROPONINI" in the last 168 hours.  BNP (last 3 results) Recent Labs    03/30/23 1836  BNP 505.0*    ProBNP (last 3 results) No results for input(s): "PROBNP" in the last 8760 hours.  CBG: No results for input(s): "GLUCAP" in the last 168 hours.  Radiological Exams on Admission: DG Chest Port 1 View  Result Date: 03/30/2023 CLINICAL DATA:  Short of breath. EXAM: PORTABLE CHEST 1 VIEW COMPARISON:  07/08/2022 and older studies. FINDINGS: Cardiac silhouette is normal in size. No  mediastinal or hilar masses. Lungs are clear. No convincing pleural effusion and no pneumothorax. Skeletal structures are grossly intact. IMPRESSION: No active disease. Electronically Signed   By: Amie Portland M.D.   On: 03/30/2023 18:46    EKG: I independently viewed the EKG done and my findings are as followed: Normal sinus rhythm at a rate of 68 bpm with RBBB and LAFB  Assessment/Plan Present on Admission:  Acute respiratory failure with hypoxia (HCC)  Essential hypertension  Acute kidney injury superimposed on chronic kidney disease (HCC)  Principal Problem:   Acute respiratory failure with hypoxia (HCC) Active Problems:   Uncontrolled type 2 diabetes mellitus with hyperglycemia, with long-term current use of insulin (HCC)   Acute kidney injury superimposed on chronic kidney disease (HCC)   Essential hypertension   Morbid obesity with BMI of 50.0-59.9, adult (HCC)   Elevated brain natriuretic peptide (BNP) level   Elevated troponin   COPD (chronic obstructive pulmonary disease) (HCC)   Mixed hyperlipidemia  Acute respiratory failure with hypoxia possibly secondary to multifactorial Continue supplemental oxygen via [place] to maintain O2 sats > 92% with plan to wean patient off supplemental oxygen as  tolerated.  Note, patient does not use supplemental oxygen at baseline  Elevated BNP rule out CHF BNP 505, this may be due to patient's elevated creatinine, however, it would be reasonable to rule out congestive heart failure in this patient Continue total input/output, daily weights and fluid restriction Continue IV Lasix 40 mg twice daily Continue heart healthy diet  Echocardiogram in the morning   Elevated troponin possible secondary to type II demand ischemia Troponin flattened;  42 > 37; patient denies chest pain  Acute kidney injury on CKD stage IV Creatinine 2.19 (baseline creatinine at 1.2-1.3) IV hydration will be temporarily held pending CHF rule out Renally adjust medications, avoid nephrotoxic agents/dehydration/hypotension  COPD (not in acute exacerbation) Patient received breathing treatment in the ED, no wheezing noted at time of my physical exam. Continue albuterol as needed  Essential hypertension Continue propranolol, Imdur  Mixed hyperlipidemia Continue statin  Type 2 diabetes mellitus with hyperglycemia Hemoglobin A1c on 02/26/2023 was 10.3 Continue Semglee 15 units nightly (patient home dose is Lantus 45 units twice daily) and adjust dose accordingly Continue ISS and hypoglycemia protocol  Morbid obesity (BMI 52.91) Diet and lifestyle modification Patient may need to follow-up with outpatient PCP for weight loss program  DVT prophylaxis: Lovenox  Advance Care Planning: Full code  Consults: None  Family Communication: None at bedside  Severity of Illness: The appropriate patient status for this patient is INPATIENT. Inpatient status is judged to be reasonable and necessary in order to provide the required intensity of service to ensure the patient's safety. The patient's presenting symptoms, physical exam findings, and initial radiographic and laboratory data in the context of their chronic comorbidities is felt to place them at high risk for further  clinical deterioration. Furthermore, it is not anticipated that the patient will be medically stable for discharge from the hospital within 2 midnights of admission.   * I certify that at the point of admission it is my clinical judgment that the patient will require inpatient hospital care spanning beyond 2 midnights from the point of admission due to high intensity of service, high risk for further deterioration and high frequency of surveillance required.*  Author: Frankey Shown, DO 03/30/2023 10:37 PM  For on call review [email or web address].

## 2023-03-30 NOTE — ED Notes (Signed)
EDP at bedside and stated pt can drink water, she can eat after tests.  Nonrebreather removed. Pt attempted RA and oxygen dropped to 79%. 4 lpm nasal cannula applied and oxygen above 90%.

## 2023-03-30 NOTE — ED Provider Notes (Signed)
[place] EMERGENCY DEPARTMENT AT [facility] Provider Note  CSN: [id number] Arrival date & time: 03/30/23 1741  Chief Complaint(s) Shortness of Breath  HPI [name] is a 57 y.o. female history of COPD, diabetes, hypertension who presents to the emergency department shortness of breath.  Patient reports that she began feeling short of breath today.  She reports some fatigue and lightheadedness.  She denies noticing any other symptoms such as chest pain, fevers or chills, nausea or vomiting cough, abdominal pain, recent travel or surgeries.  She called the paramedics who found her with saturation of 76% on room air and was placed on oxygen.  She was then brought to the emergency department.   Past Medical History Past Medical History:  Diagnosis Date   Agitation 07/28/2022   Anxiety    COPD (chronic obstructive pulmonary disease) (HCC)    Diabetes mellitus    Fear of    falling   Hemophilia (HCC)    Hypertension    Panic attacks    Parkinson's disease    Seizures (HCC)    Patient Active Problem List   Diagnosis Date Noted   Acute psychosis (HCC) 02/26/2023   Morbid obesity with BMI of 40.0-44.9, adult (HCC) 02/26/2023   Bipolar disorder, current episode manic severe with psychotic features (HCC) 02/26/2023   Agitation 07/28/2022   Closed fracture dislocation of joint of right shoulder girdle    AKI (acute kidney injury) (HCC)    Traumatic closed displaced fracture of right shoulder with anterior dislocation 04/25/2021   Acute on chronic respiratory failure with hypoxia and hypercapnia (HCC) 04/23/2021   Acute on chronic respiratory failure with hypoxia (HCC) 04/22/2021   Acute renal failure superimposed on stage 3a chronic kidney disease (HCC) 04/22/2021   Fall    Anterior shoulder dislocation, right, initial encounter    Chronic kidney disease, stage 2 (mild) 04/22/2018   Essential hypertension 04/22/2018   Hypercalcemia 04/22/2018   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 Authorizing Provider  aspirin EC 81 MG tablet Take 81 mg by mouth every morning.    [provider]  insulin aspart (NOVOLOG) 100 UNIT/ML injection CBG < 70: implement hypoglycemia protocol CBG 70 - 120: 0 units CBG 121 - 150: 3 units CBG 151 - 200: 4 units CBG 201 - 250: 7 units CBG 251 - 300: 11 units CBG 301 - 350: 15 units CBG 351 - 400: 20 units Patient taking differently: Inject 3-20 Units into the skin 3 (three) times daily with meals. CBG < 70: implement hypoglycemia protocol CBG 70 - 120: 0 units CBG 121 - 150: 3 units      CBG 151 - 200: 4 units CBG 201 - 250: 7 units CBG 251 - 300: 11 units CBG 301 - 350: 15 units CBG 351 - 400: 20 units 04/20/14   Kathlen Mody, MD  isosorbide mononitrate (IMDUR) 30 MG 24 hr tablet Take 1 tablet (30 mg total) by mouth  daily. 07/08/22   Terrilee Files, MD  LANTUS SOLOSTAR 100 UNIT/ML Solostar Pen Inject 45 Units into the skin 2 (two) times daily. 02/13/23   [provider]  lisinopril (ZESTRIL) 2.5 MG tablet Take 2.5 mg by mouth daily. 02/03/23   [provider]  loratadine (CLARITIN) 10 MG tablet Take 10 mg by mouth daily.    [provider]  metFORMIN (GLUCOPHAGE) 500 MG tablet Take 1 tablet (500 mg total) by mouth 2 (two) times daily with a meal. 07/08/22   Terrilee Files, MD  MYRBETRIQ 25 MG TB24 tablet Take 25 mg by mouth daily.  02/03/23   [provider]  propranolol (INDERAL) 60 MG tablet Take 1 tablet (60 mg total) by mouth daily. On an empty stomach 07/08/22   Terrilee Files, MD  rosuvastatin (CRESTOR) 20 MG tablet Take 20 mg by mouth daily. 02/03/23   [provider]                                                                                                                                    Past Surgical History Past Surgical History:  Procedure Laterality Date   ABDOMINAL HYSTERECTOMY     OOPHORECTOMY     SHOULDER CLOSED REDUCTION Right 04/22/2021   Procedure: CLOSED REDUCTION SHOULDER;  Surgeon: Vickki Hearing, MD;  Location: [facility];  Service: Orthopedics;  Laterality: Right;   SHOULDER CLOSED REDUCTION Right 04/25/2021   Procedure: CLOSED REDUCTION SHOULDER;  Surgeon: Vickki Hearing, MD;  Location: [facility];  Service: Orthopedics;  Laterality: Right;   Family History History reviewed. No pertinent family history.  Social History Social History   Tobacco Use   Smoking status: Never   Smokeless tobacco: Never  Substance Use Topics   Alcohol use: No   Drug use: No   Allergies Bee venom  Review of Systems Review of Systems  All other systems reviewed and are negative.   Physical Exam Vital Signs  I have reviewed the triage vital signs BP 113/86   Pulse 78   Temp 99.2 F (37.3 C)   Resp (!) 27   Ht 5\' 6"  (1.676 m)   Wt 117.9 kg   SpO2 91%   BMI 41.97 kg/m  Physical Exam Vitals and nursing note reviewed.  Constitutional:      General: She is not in acute distress.    Appearance: She is well-developed. She is obese.  HENT:     Head: Normocephalic and atraumatic.     Mouth/Throat:     Mouth: Mucous membranes are moist.  Eyes:     Pupils: Pupils are equal, round, and reactive to light.  Cardiovascular:     Rate and Rhythm: Normal rate and regular rhythm.     Heart sounds: No murmur heard. Pulmonary:     Effort: Accessory muscle usage present. No  respiratory distress.     Breath sounds: Examination of  the right-lower field reveals rales. Examination of the left-lower field reveals rales. Rales present.  Abdominal:     General: Abdomen is flat.     Palpations: Abdomen is soft.     Tenderness: There is no abdominal tenderness.  Musculoskeletal:        General: No tenderness.     Right lower leg: Edema present.     Left lower leg: Edema present.  Skin:    General: Skin is warm and dry.  Neurological:     General: No focal deficit present.     Mental Status: She is alert. Mental status is at baseline.  Psychiatric:        Mood and Affect: Mood normal.        Behavior: Behavior normal.     ED Results and Treatments Labs (all labs ordered are listed, but only abnormal results are displayed) Labs Reviewed  BASIC METABOLIC PANEL - Abnormal; Notable for the following components:      Result Value   Sodium 133 (*)    Potassium 5.2 (*)    Chloride 97 (*)    Glucose, Bld 288 (*)    BUN 60 (*)    Creatinine, Ser 2.19 (*)    Calcium 8.4 (*)    GFR, Estimated 25 (*)    All other components within normal limits  CBC - Abnormal; Notable for the following components:   RBC 3.83 (*)    Hemoglobin 10.7 (*)    MCHC 29.3 (*)    nRBC 1.2 (*)    All other components within normal limits  BLOOD GAS, VENOUS - Abnormal; Notable for the following components:   pCO2, Ven 68 (*)    pO2, Ven 46 (*)    Bicarbonate 30.8 (*)    All other components within normal limits  BRAIN NATRIURETIC PEPTIDE - Abnormal; Notable for the following components:   B Natriuretic Peptide 505.0 (*)    All other components within normal limits  TROPONIN I (HIGH SENSITIVITY) - Abnormal; Notable for the following components:   Troponin I (High Sensitivity) 42 (*)    All other components within normal limits  TROPONIN I (HIGH SENSITIVITY)                                                                                                                           Radiology DG Chest Port 1 View  Result Date: 03/30/2023 CLINICAL DATA:  Short of breath. EXAM: PORTABLE CHEST 1 VIEW COMPARISON:  07/08/2022 and older studies. FINDINGS: Cardiac silhouette is normal in size. No mediastinal or hilar masses. Lungs are clear. No convincing pleural effusion and no pneumothorax. Skeletal structures are grossly intact. IMPRESSION: No active disease. Electronically Signed   By: Amie Portland M.D.   On: 03/30/2023 18:46    Pertinent labs & imaging results that were available during my care of the patient were reviewed by me and considered in my medical decision making (see MDM for details).  Medications  Ordered in ED Medications  albuterol (PROVENTIL) (2.5 MG/3ML) 0.083% nebulizer solution 2.5 mg (has no administration in time range)  ipratropium-albuterol (DUONEB) 0.5-2.5 (3) MG/3ML nebulizer solution 3 mL (3 mLs Nebulization Given 03/30/23 1845)  methylPREDNISolone sodium succinate (SOLU-MEDROL) 125 mg/2 mL injection 125 mg (125 mg Intravenous Given 03/30/23 1952)  furosemide (LASIX) injection 40 mg (40 mg Intravenous Given 03/30/23 2019)                                                                                                                                     Procedures .Critical Care  Performed by: Lonell Grandchild, MD Authorized by: Lonell Grandchild, MD   Critical care provider statement:    Critical care time (minutes):  30   Critical care was necessary to treat or prevent imminent or life-threatening deterioration of the following conditions:  Respiratory failure   Critical care was time spent personally by me on the following activities:  Development of treatment plan with patient or surrogate, discussions with consultants, evaluation of patient's response to treatment, examination of patient, ordering and review of laboratory studies, ordering and review of radiographic studies, ordering and performing treatments and interventions, pulse oximetry,  re-evaluation of patient's condition and review of old charts   Care discussed with: admitting provider     (including critical care time)  Medical Decision Making / ED Course   MDM:  57 year old female presenting to the emergency department shortness of breath.  Patient overall well-appearing, is hypoxic requiring oxygen.  No history of home oxygen use.  Given signs of lower extremity edema, bibasilar crackles consider new CHF diagnosis.  Will check BNP.  She does have COPD, she has some trace wheezing will trial breathing treatment but think this is less likely the ultimate cause of her symptoms.  Differential also includes pneumonia, pneumothorax, will check chest x-ray.  Doubt pulmonary embolism, no tachycardia or chest pain.  Will reassess.  Given new oxygen requirement patient will likely need to be admitted.  Clinical Course as of 03/30/23 2030  Fri Mar 30, 2023  2027 BNP is elevated, suspect more likely CHF. Also with AKI. I suspect likely cardiorenal given signs of volume overload on exam. This will be trended by hospitalist and if worsens with diuresis can receive fluids as inpatient. Will admit [WS]    Clinical Course User Index [WS] Lonell Grandchild, MD     Additional history obtained: -Additional history obtained from ems -External records from outside source obtained and reviewed including: Chart review including previous notes, labs, imaging, consultation notes including prior echocardiograms   Lab Tests: -I ordered, reviewed, and interpreted labs.   The pertinent results include:   Labs Reviewed  BASIC METABOLIC PANEL - Abnormal; Notable for the following components:      Result Value   Sodium 133 (*)    Potassium 5.2 (*)    Chloride 97 (*)    Glucose,  Bld 288 (*)    BUN 60 (*)    Creatinine, Ser 2.19 (*)    Calcium 8.4 (*)    GFR, Estimated 25 (*)    All other components within normal limits  CBC - Abnormal; Notable for the following components:    RBC 3.83 (*)    Hemoglobin 10.7 (*)    MCHC 29.3 (*)    nRBC 1.2 (*)    All other components within normal limits  BLOOD GAS, VENOUS - Abnormal; Notable for the following components:   pCO2, Ven 68 (*)    pO2, Ven 46 (*)    Bicarbonate 30.8 (*)    All other components within normal limits  BRAIN NATRIURETIC PEPTIDE - Abnormal; Notable for the following components:   B Natriuretic Peptide 505.0 (*)    All other components within normal limits  TROPONIN I (HIGH SENSITIVITY) - Abnormal; Notable for the following components:   Troponin I (High Sensitivity) 42 (*)    All other components within normal limits  TROPONIN I (HIGH SENSITIVITY)    Notable for troponin elevated likely demand related  EKG   EKG Interpretation Date/Time:  Friday March 30 2023 18:27:40 EDT Ventricular Rate:  82 PR Interval:  157 QRS Duration:  133 QT Interval:  375 QTC Calculation: 438 R Axis:   -74  Text Interpretation: Sinus rhythm RBBB and LAFB Confirmed by Alvino Blood ([id number]) on 03/30/2023 6:32:47 PM         Imaging Studies ordered: I ordered imaging studies including CXR On my interpretation imaging demonstrates pulmonary edema I independently visualized and interpreted imaging.   Medicines ordered and prescription drug management: Meds ordered this encounter  Medications   DISCONTD: albuterol (VENTOLIN HFA) 108 (90 Base) MCG/ACT inhaler 2 puff   albuterol (PROVENTIL) (2.5 MG/3ML) 0.083% nebulizer solution 2.5 mg   ipratropium-albuterol (DUONEB) 0.5-2.5 (3) MG/3ML nebulizer solution 3 mL   methylPREDNISolone sodium succinate (SOLU-MEDROL) 125 mg/2 mL injection 125 mg   furosemide (LASIX) injection 40 mg    -I have reviewed the patients home medicines and have made adjustments as needed   Consultations Obtained: I requested consultation with the hospitalist,  and discussed lab and imaging findings as well as pertinent plan - they recommend: admission   Cardiac Monitoring: The  patient was maintained on a cardiac monitor.  I personally viewed and interpreted the cardiac monitored which showed an underlying rhythm of: NSR  Social Determinants of Health:  Diagnosis or treatment significantly limited by social determinants of health: obesity and lives alone   Reevaluation: After the interventions noted above, I reevaluated the patient and found that their symptoms have improved  Co morbidities that complicate the patient evaluation  Past Medical History:  Diagnosis Date   Agitation 07/28/2022   Anxiety    COPD (chronic obstructive pulmonary disease) (HCC)    Diabetes mellitus    Fear of    falling   Hemophilia (HCC)    Hypertension    Panic attacks    Parkinson's disease    Seizures (HCC)       Dispostion: Disposition decision including need for hospitalization was considered, and patient admitted to the hospital.    Final Clinical Impression(s) / ED Diagnoses Final diagnoses:  Acute hypoxic respiratory failure (HCC)     This chart was dictated using voice recognition software.  Despite best efforts to proofread,  errors can occur which can change the documentation meaning.    Lonell Grandchild, MD 03/30/23 2030

## 2023-03-30 NOTE — ED Notes (Signed)
Drink, graham crackers and peanut butter given to pt to eat.

## 2023-03-31 ENCOUNTER — Other Ambulatory Visit (HOSPITAL_COMMUNITY): Payer: Self-pay | Admitting: *Deleted

## 2023-03-31 ENCOUNTER — Inpatient Hospital Stay (HOSPITAL_COMMUNITY): Payer: Medicaid Other

## 2023-03-31 DIAGNOSIS — R4182 Altered mental status, unspecified: Secondary | ICD-10-CM

## 2023-03-31 DIAGNOSIS — I5031 Acute diastolic (congestive) heart failure: Secondary | ICD-10-CM

## 2023-03-31 DIAGNOSIS — J9602 Acute respiratory failure with hypercapnia: Secondary | ICD-10-CM

## 2023-03-31 DIAGNOSIS — J9601 Acute respiratory failure with hypoxia: Secondary | ICD-10-CM | POA: Diagnosis not present

## 2023-03-31 LAB — BLOOD GAS, ARTERIAL
Acid-Base Excess: 4 mmol/L — ABNORMAL HIGH (ref 0.0–2.0)
Acid-Base Excess: 2.8 mmol/L — ABNORMAL HIGH (ref 0.0–2.0)
Bicarbonate: 32.2 mmol/L — ABNORMAL HIGH (ref 20.0–28.0)
Bicarbonate: 30.7 mmol/L — ABNORMAL HIGH (ref 20.0–28.0)
Drawn by: 38235
Drawn by: 7607
FIO2: 40 %
O2 Saturation: 92.8 %
O2 Saturation: 95.9 %
Patient temperature: 37.5
Patient temperature: 37
pCO2 arterial: 65 mmHg — ABNORMAL HIGH (ref 32–48)
pCO2 arterial: 61 mmHg — ABNORMAL HIGH (ref 32–48)
pH, Arterial: 7.3 — ABNORMAL LOW (ref 7.35–7.45)
pH, Arterial: 7.31 — ABNORMAL LOW (ref 7.35–7.45)
pO2, Arterial: 67 mmHg — ABNORMAL LOW (ref 83–108)
pO2, Arterial: 73 mmHg — ABNORMAL LOW (ref 83–108)

## 2023-03-31 LAB — CBC
HCT: 38.2 % (ref 36.0–46.0)
Hemoglobin: 11.2 g/dL — ABNORMAL LOW (ref 12.0–15.0)
MCH: 27.9 pg (ref 26.0–34.0)
MCHC: 29.3 g/dL — ABNORMAL LOW (ref 30.0–36.0)
MCV: 95.3 fL (ref 80.0–100.0)
Platelets: 231 10*3/uL (ref 150–400)
RBC: 4.01 MIL/uL (ref 3.87–5.11)
RDW: 14.4 % (ref 11.5–15.5)
WBC: 4.9 10*3/uL (ref 4.0–10.5)
nRBC: 0.8 % — ABNORMAL HIGH (ref 0.0–0.2)

## 2023-03-31 LAB — COMPREHENSIVE METABOLIC PANEL
ALT: 56 U/L — ABNORMAL HIGH (ref 0–44)
AST: 36 U/L (ref 15–41)
Albumin: 3.3 g/dL — ABNORMAL LOW (ref 3.5–5.0)
Alkaline Phosphatase: 132 U/L — ABNORMAL HIGH (ref 38–126)
Anion gap: 12 (ref 5–15)
BUN: 59 mg/dL — ABNORMAL HIGH (ref 6–20)
CO2: 26 mmol/L (ref 22–32)
Calcium: 8.7 mg/dL — ABNORMAL LOW (ref 8.9–10.3)
Chloride: 98 mmol/L (ref 98–111)
Creatinine, Ser: 1.91 mg/dL — ABNORMAL HIGH (ref 0.44–1.00)
GFR, Estimated: 30 mL/min — ABNORMAL LOW (ref >60)
Glucose, Bld: 335 mg/dL — ABNORMAL HIGH (ref 70–99)
Potassium: 5.2 mmol/L — ABNORMAL HIGH (ref 3.5–5.1)
Sodium: 136 mmol/L (ref 135–145)
Total Bilirubin: 0.5 mg/dL (ref 0.3–1.2)
Total Protein: 6.7 g/dL (ref 6.5–8.1)

## 2023-03-31 LAB — ECHOCARDIOGRAM COMPLETE
Area-P 1/2: 3.72 cm2
Height: 66 in
MV VTI: 2.7 cm2
S' Lateral: 2.1 cm
Weight: 5146.42 oz

## 2023-03-31 LAB — MAGNESIUM: Magnesium: 2.3 mg/dL (ref 1.7–2.4)

## 2023-03-31 LAB — PHOSPHORUS: Phosphorus: 5.4 mg/dL — ABNORMAL HIGH (ref 2.5–4.6)

## 2023-03-31 LAB — GLUCOSE, CAPILLARY
Glucose-Capillary: 285 mg/dL — ABNORMAL HIGH (ref 70–99)
Glucose-Capillary: 387 mg/dL — ABNORMAL HIGH (ref 70–99)
Glucose-Capillary: 302 mg/dL — ABNORMAL HIGH (ref 70–99)
Glucose-Capillary: 362 mg/dL — ABNORMAL HIGH (ref 70–99)

## 2023-03-31 LAB — HIV ANTIBODY (ROUTINE TESTING W REFLEX): HIV Screen 4th Generation wRfx: NONREACTIVE

## 2023-03-31 MED ORDER — DM-GUAIFENESIN ER 30-600 MG PO TB12
1.0000 | ORAL_TABLET | Freq: Two times a day (BID) | ORAL | Status: DC
  Administered 2023-03-31 – 2023-04-04 (×9): 1 via ORAL
  Filled 2023-03-31 (×9): qty 1

## 2023-03-31 MED ORDER — IPRATROPIUM-ALBUTEROL 0.5-2.5 (3) MG/3ML IN SOLN: 3.0000 mL | RESPIRATORY_TRACT | Status: DC | PRN

## 2023-03-31 MED ORDER — OLANZAPINE 5 MG PO TABS
5.0000 mg | ORAL_TABLET | Freq: Every day | ORAL | Status: DC
  Administered 2023-03-31 – 2023-04-03 (×4): 5 mg via ORAL
  Filled 2023-03-31 (×4): qty 1

## 2023-03-31 MED ORDER — AZITHROMYCIN 250 MG PO TABS
250.0000 mg | ORAL_TABLET | Freq: Every day | ORAL | Status: AC
  Administered 2023-04-01 – 2023-04-04 (×4): 250 mg via ORAL
  Filled 2023-03-31 (×4): qty 1

## 2023-03-31 MED ORDER — METHYLPREDNISOLONE SODIUM SUCC 40 MG IJ SOLR
40.0000 mg | INTRAMUSCULAR | Status: DC
  Administered 2023-04-01 – 2023-04-04 (×4): 40 mg via INTRAVENOUS
  Filled 2023-03-31 (×4): qty 1

## 2023-03-31 MED ORDER — ENOXAPARIN SODIUM 80 MG/0.8ML IJ SOSY
70.0000 mg | PREFILLED_SYRINGE | INTRAMUSCULAR | Status: DC
  Administered 2023-04-01 – 2023-04-05 (×5): 70 mg via SUBCUTANEOUS
  Filled 2023-03-31 (×5): qty 0.8

## 2023-03-31 MED ORDER — CHLORHEXIDINE GLUCONATE CLOTH 2 % EX PADS
6.0000 | MEDICATED_PAD | Freq: Every day | CUTANEOUS | Status: DC
  Administered 2023-03-31 – 2023-04-05 (×6): 6 via TOPICAL

## 2023-03-31 MED ORDER — SODIUM ZIRCONIUM CYCLOSILICATE 5 G PO PACK
5.0000 g | PACK | Freq: Once | ORAL | Status: AC
  Administered 2023-03-31: 5 g via ORAL
  Filled 2023-03-31: qty 1

## 2023-03-31 MED ORDER — FUROSEMIDE 10 MG/ML IJ SOLN
40.0000 mg | Freq: Once | INTRAMUSCULAR | Status: AC
  Administered 2023-03-31: 40 mg via INTRAVENOUS
  Filled 2023-03-31: qty 4

## 2023-03-31 MED ORDER — INSULIN GLARGINE-YFGN 100 UNIT/ML ~~LOC~~ SOLN
30.0000 [IU] | Freq: Every day | SUBCUTANEOUS | Status: DC
  Administered 2023-03-31: 30 [IU] via SUBCUTANEOUS
  Filled 2023-03-31 (×2): qty 0.3

## 2023-03-31 MED ORDER — PANTOPRAZOLE SODIUM 40 MG PO TBEC
40.0000 mg | DELAYED_RELEASE_TABLET | Freq: Every day | ORAL | Status: DC
  Administered 2023-03-31 – 2023-04-05 (×6): 40 mg via ORAL
  Filled 2023-03-31 (×6): qty 1

## 2023-03-31 MED ORDER — AZITHROMYCIN 250 MG PO TABS
500.0000 mg | ORAL_TABLET | Freq: Every day | ORAL | Status: AC
  Administered 2023-03-31: 500 mg via ORAL
  Filled 2023-03-31: qty 2

## 2023-03-31 MED ORDER — METHYLPREDNISOLONE SODIUM SUCC 40 MG IJ SOLR
40.0000 mg | Freq: Two times a day (BID) | INTRAMUSCULAR | Status: DC
  Administered 2023-03-31: 40 mg via INTRAVENOUS
  Filled 2023-03-31: qty 1

## 2023-03-31 NOTE — Progress Notes (Signed)
[facility] PROGRESS NOTE  [name] (DOB: May 27, 1966) ZOX:[id number] PCP: [facility]  Brief Narrative: [name] is a 57 y.o. female with a history of COPD, OHS, OSA, morbid obesity (BMI 51), HTN, T2DM, HLD, bipolar disorder with inpatient psychiatric hospitalization last month who presented to the ED on 03/30/2023 with shortness of breath, found to be hypoxic by EMS. She remained hypoxemic in the ED though CXR was clear. BNP was elevated. Lasix, steroids, and duonebs given and the patient admitted to the floor. She became confused prompting an ABG which showed respiratory acidosis for which BiPAP was started and she was moved to the SDU.   Subjective: Goes by [name], speaks quite a bit about her adverse living situation, doesn't feel safe with her current living partner who is her husband. She does seem to be feeling better, thinks her breathing is certainly better, declined to continue the BiPAP this morning. Denies chest pain. During ROS continues going back to a nephew she likes in [place]. Denies any leg swelling, unclear whether she has orthopnea. Doesn't want information shared with people. Does feel she's wheezy. She is certain that she is pregnant.  Objective: BP (!) 150/74   Pulse 77   Temp 97.8 F (36.6 C) (Oral)   Resp (!) 21   Ht 5\' 6"  (1.676 m)   Wt (!) 145.9 kg   SpO2 95%   BMI 51.92 kg/m   Gen: Morbidly obese, unkempt female in no acute distress eating breakfast Pulm: Distant without crackles or wheezes, tachypneic. Gets out of breath trying to speak long sentences.  CV: RRR, no MRG though distant, no pitting edema GI: Soft, NT, ND, +BS  Neuro: Alert and oriented. No new focal deficits. Psych: Tangential, perseverative, broad affect, doesn't appear to be responding to internal stimuli/AVH.   Assessment & Plan: Principal Problem:   Acute respiratory failure with hypoxia (HCC) Active Problems:   Uncontrolled type 2 diabetes  mellitus with hyperglycemia, with long-term current use of insulin (HCC)   Acute kidney injury superimposed on chronic kidney disease (HCC)   Essential hypertension   Morbid obesity with BMI of 50.0-59.9, adult (HCC)   Elevated brain natriuretic peptide (BNP) level   Elevated troponin   COPD (chronic obstructive pulmonary disease) (HCC)   Mixed hyperlipidemia  Acute hypoxic and hypercarbic respiratory failure: Suspect her pulmonary reserve is limited by OHS, OSA and COPD, though cause of acute decompensation is unclear. CXR is clear and though BNP is elevated, she does not appear terribly volume overloaded. She has no tachycardia, chest pain or DVT symptoms, though if hypoxia were to persist despite measures below, would need CTA for PE r/o.  - Will check d-dimer.  - Continue supplemental oxygen to maintain SpO2 88-95% and normal respiratory effort. Continue prn BiPAP, remain in SDU until off x24 hours.  Bipolar disorder, concern for unsafe living conditions: Admitted to inpatient psych facility last month for acute psychosis, and has abnormal presentation at this time. Presented with acute psychosis 02/26/2023. There was some question of whether UTI precipitated this episode (as it was thought to have done in her index presentation for psychosis in Nov 2023), so will check UA. TTS at that time mentioned it is odd for psychosis to develop for the first time at such an age.  - Would appreciate psychiatry evaluation nonurgently. - No home medications on list. Could use zyprexa cautiously qHS to help with symptoms, low dose as she is at risk of CO2 retention  from hypoventilation.  COPD: No wheezing on my exam.  - Deescalate steroids quickly with concern for steroid-induced hyperglycemia and impact to psychiatric condition - Started on azithromycin at admission.  AKI on stage IIIa CKD: Baseline Cr 1.2-1.3, was 2.19 on admission and improved with diuretic to 1.91.  - Hold ACE inhibitor - Repeating  lasix again as above for concern of some volume excess, will monitor UOP and BMP closely.   Hyperkalemia: Mild.  - Hold ACE inhibitor, give lokelma x1, keep cardiac monitoring.   Morbid obesity: Body mass index is 51.92 kg/m.  - With concomitant T2DM, could consider GLP1 agent.   T2DM: Last HbA1c 10.3% - Continue resistant SSI and double basal insulin tonight. Her home dose is listed as 45u BID though it's not clear that she takes that.   HTN:  - Continue home propranolol and imdur. Hold lisinopril with hyperkalemic AKI. If propranolol is only for HTN, should change to cardioselective agent with concomitant COPD.  Troponin elevation: Suspect demand myocardial ischemia as she has no chest pain and has respiratory failure. Troponins are trending downward.  - Monitor closely. Continue ASA, statin, beta blocker.   HLD:  - Continue statin  Casey Nine, MD [facility] [email or web address] 03/31/2023, 5:25 PM

## 2023-03-31 NOTE — Progress Notes (Signed)
Attempted CT scan; pt unwilling to do scan due to breakfast

## 2023-03-31 NOTE — Progress Notes (Addendum)
RN called due to patient being confused. ABG was done: ABG    Component Value Date/Time   PHART 7.3 (L) 03/31/2023 0146   PCO2ART 65 (H) 03/31/2023 0146   PO2ART 67 (L) 03/31/2023 0146   HCO3 32.2 (H) 03/31/2023 0146   TCO2 40 04/15/2014 0941   O2SAT 92.8 03/31/2023 0146   She was noted to be retaining CO2.  Patient will be transitioned to BiPAP and transferred to stepdown unit at this time. ABG will be repeated in the next few hours to ensure improvement in patient's confusion.   Physical Exam  BP (!) 145/77 (BP Location: Left Arm)   Pulse 90   Temp 98.3 F (36.8 C) (Oral)   Resp 20   Ht 5\' 6"  (1.676 m)   Wt (!) 148.7 kg   SpO2 93%   BMI 52.91 kg/m   Patient with decreased breath sounds and no wheezing at this time  Assessment and plan Acute respiratory failure with hypoxia and hypercapnia Continue BiPAP Continue duo nebs, Mucinex, Solu-Medrol, azithromycin. Continue Protonix to prevent steroid-induced ulcer Continue incentive spirometry and flutter valve  Altered mental status This may be due to above vs possible underlying psychosis CT of head with contrast will be done Reviewing patient's history, it was noted that she has had prior episodes of psychosis which was at 1 time cleared (07/27/2022) by psychiatrist. Consider psychiatry consult prior to discharge if patient continues to be confused, otherwise, she may follow-up with psychiatrist as an outpatient  Possible obstructive sleep apnea and obesity hypoventilation syndrome Patient may need an outpatient sleep study on discharge Continue management as described above  Total time:  31 minutes This includes time reviewing the chart including progress notes, labs, EKGs, taking medical decisions, ordering labs and documenting findings.

## 2023-03-31 NOTE — Progress Notes (Signed)
*  PRELIMINARY RESULTS* Echocardiogram 2D Echocardiogram has been performed.  Stacey Drain 03/31/2023, 1:11 PM

## 2023-03-31 NOTE — TOC Initial Note (Signed)
Transition of Care [facility]) - Initial/Assessment Note    Patient Details  Name: [name] MRN: [id number] Date of Birth: [birth year]  Transition of Care [facility]) CM/SW Contact:    Catalina Gravel, LCSW Phone Number: 03/31/2023, 6:04 PM  Clinical Narrative:                 Pt from home.  No TOC needs at present, TOC to follow.     Barriers to Discharge: Continued Medical Work up   Patient Goals and CMS Choice            Expected Discharge Plan and Services                                              Prior Living Arrangements/Services                       Activities of Daily Living      Permission Sought/Granted                  Emotional Assessment              Admission diagnosis:  Acute respiratory failure with hypoxia (HCC) [J96.01] Acute hypoxic respiratory failure (HCC) [J96.01] Patient Active Problem List   Diagnosis Date Noted   Elevated brain natriuretic peptide (BNP) level 03/30/2023   Elevated troponin 03/30/2023   COPD (chronic obstructive pulmonary disease) (HCC) 03/30/2023   Mixed hyperlipidemia 03/30/2023   Acute psychosis (HCC) 02/26/2023   Morbid obesity with BMI of 50.0-59.9, adult (HCC) 02/26/2023   Bipolar disorder, current episode manic severe with psychotic features (HCC) 02/26/2023   Agitation 07/28/2022   Closed fracture dislocation of joint of right shoulder girdle    Acute kidney injury superimposed on chronic kidney disease (HCC)    Traumatic closed displaced fracture of right shoulder with anterior dislocation 04/25/2021   Acute on chronic respiratory failure with hypoxia and hypercapnia (HCC) 04/23/2021   Acute on chronic respiratory failure with hypoxia (HCC) 04/22/2021   Acute renal failure superimposed on stage 3a chronic kidney disease (HCC) 04/22/2021   Fall    Anterior shoulder dislocation, right, initial encounter    Chronic kidney disease, stage 2 (mild) 04/22/2018   Essential hypertension  04/22/2018   Hypercalcemia 04/22/2018   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   Acute respiratory failure with hypoxia (HCC) 04/15/2014   Septic shock(785.52) 04/15/2014   Sepsis (HCC) 04/15/2014   ARF (acute renal failure) (HCC) 04/12/2014   SBO (small bowel obstruction) (HCC) 04/11/2014   Bipolar disorder (HCC) 04/11/2014   Uncontrolled type 2 diabetes mellitus with hyperglycemia, with long-term current use of insulin (HCC) 04/11/2014   Seizure disorder (HCC) 04/11/2014   DEGENERATIVE JOINT DISEASE, RIGHT KNEE 07/13/2008   KNEE PAIN 07/13/2008   PCP:  [facility] Pharmacy:   [facility] - [place], [place] - [address] [place] [address] Phone: [phone] Fax: [phone]     Social Determinants of Health (SDOH) Social History: SDOH Screenings   Tobacco Use: Low Risk  (03/30/2023)   SDOH Interventions:     Readmission Risk Interventions    04/25/2021    1:27 PM  Readmission Risk Prevention Plan  Transportation Screening Complete  Home Care Screening Complete  Medication Review (RN CM) Complete

## 2023-04-01 ENCOUNTER — Inpatient Hospital Stay (HOSPITAL_COMMUNITY): Payer: Medicaid Other

## 2023-04-01 DIAGNOSIS — J9601 Acute respiratory failure with hypoxia: Secondary | ICD-10-CM | POA: Diagnosis not present

## 2023-04-01 LAB — GLUCOSE, CAPILLARY
Glucose-Capillary: 209 mg/dL — ABNORMAL HIGH (ref 70–99)
Glucose-Capillary: 257 mg/dL — ABNORMAL HIGH (ref 70–99)
Glucose-Capillary: 399 mg/dL — ABNORMAL HIGH (ref 70–99)
Glucose-Capillary: 253 mg/dL — ABNORMAL HIGH (ref 70–99)
Glucose-Capillary: 339 mg/dL — ABNORMAL HIGH (ref 70–99)

## 2023-04-01 LAB — BASIC METABOLIC PANEL
Anion gap: 10 (ref 5–15)
BUN: 63 mg/dL — ABNORMAL HIGH (ref 6–20)
CO2: 30 mmol/L (ref 22–32)
Calcium: 8.7 mg/dL — ABNORMAL LOW (ref 8.9–10.3)
Chloride: 97 mmol/L — ABNORMAL LOW (ref 98–111)
Creatinine, Ser: 1.73 mg/dL — ABNORMAL HIGH (ref 0.44–1.00)
GFR, Estimated: 34 mL/min — ABNORMAL LOW (ref >60)
Glucose, Bld: 249 mg/dL — ABNORMAL HIGH (ref 70–99)
Potassium: 4.8 mmol/L (ref 3.5–5.1)
Sodium: 137 mmol/L (ref 135–145)

## 2023-04-01 LAB — D-DIMER, QUANTITATIVE: D-Dimer, Quant: 0.52 ug/mL-FEU — ABNORMAL HIGH (ref 0.00–0.50)

## 2023-04-01 MED ORDER — INSULIN GLARGINE-YFGN 100 UNIT/ML ~~LOC~~ SOLN
30.0000 [IU] | Freq: Two times a day (BID) | SUBCUTANEOUS | Status: DC
  Administered 2023-04-01 – 2023-04-05 (×9): 30 [IU] via SUBCUTANEOUS
  Filled 2023-04-01 (×12): qty 0.3

## 2023-04-01 MED ORDER — FUROSEMIDE 10 MG/ML IJ SOLN
40.0000 mg | Freq: Once | INTRAMUSCULAR | Status: AC
  Administered 2023-04-01: 40 mg via INTRAVENOUS
  Filled 2023-04-01: qty 4

## 2023-04-01 MED ORDER — IOHEXOL 350 MG/ML SOLN
75.0000 mL | Freq: Once | INTRAVENOUS | Status: AC | PRN
  Administered 2023-04-01: 75 mL via INTRAVENOUS

## 2023-04-01 MED ORDER — MUPIROCIN 2 % EX OINT
1.0000 | TOPICAL_OINTMENT | Freq: Two times a day (BID) | CUTANEOUS | Status: DC
  Administered 2023-04-01 – 2023-04-05 (×9): 1 via NASAL
  Filled 2023-04-01 (×2): qty 22

## 2023-04-01 NOTE — Plan of Care (Signed)

## 2023-04-01 NOTE — Progress Notes (Signed)
[facility] PROGRESS NOTE  JAKYRA OFFNER (DOB: Apr 08, 1966) ZOX:[id number] PCP: [facility]  Brief Narrative: [name] is a 57 y.o. female with a history of COPD, OHS, OSA, morbid obesity (BMI 51), HTN, T2DM, HLD, bipolar disorder with inpatient psychiatric hospitalization last month who presented to the ED on 03/30/2023 with shortness of breath, found to be hypoxic by EMS. She remained hypoxemic in the ED though CXR was clear. BNP was elevated. Lasix, steroids, and duonebs given and the patient admitted to the floor. She became confused prompting an ABG which showed respiratory acidosis for which BiPAP was started and she was moved to the SDU.   Subjective: Feels better, got a good night's sleep and feels her breathing is normal, but still on 4L O2. Feels unsafe in her current living arrangements.  Objective: BP (!) 132/95   Pulse 69   Temp 98.3 F (36.8 C) (Oral)   Resp 19   Ht 5\' 6"  (1.676 m)   Wt (!) 143.8 kg   SpO2 99%   BMI 51.17 kg/m   Gen: No distress eating full breakfast Pulm: Nonlabored, no wheezes or crackles, distant  CV: Distant RRR, no MRG. +dependent edema  GI: Soft, NT, ND, +BS  Neuro: Alert and oriented. No new focal deficits. Psych: Broad affect, elevated mood this morning, perseverative over her relationships with multiple people, delusion of pregnancy, no active internal stimuli evident this morning. Ext: Warm, no deformities Skin: No acute rashes, lesions or ulcers on visualized skin   Assessment & Plan: Acute hypoxic and hypercarbic respiratory failure: Suspect her pulmonary reserve is limited by OHS, OSA and COPD, though cause of acute decompensation is unclear. CXR is clear and though BNP is elevated, she does not appear terribly volume overloaded. She has no tachycardia, chest pain or DVT symptoms, though if hypoxia were to persist despite measures below, would need CTA for PE r/o.  - D-dimer 0.52. Borderline,  technically below age-adjusted cutoff. I decreased supplemental oxygen to 2L. If unable to wean to room air, would have to r/o PE. - Continue supplemental oxygen to maintain SpO2 88-95% and normal respiratory effort. Stable off BiPAP, transfer to floor.  Bipolar disorder, concern for unsafe living conditions: Admitted to inpatient psych facility last month for acute psychosis, and has abnormal presentation at this time. Presented with acute psychosis 02/26/2023. There was some question of whether UTI precipitated this episode (as it was thought to have done in her index presentation for psychosis in Nov 2023), so will check UA. TTS at that time mentioned it is odd for psychosis to develop for the first time at such an age.  - Would appreciate psychiatry evaluation, TTS consulted. - No home medications on list. Started zyprexa qHS which improved symptoms. Note low dose as she is at risk of CO2 retention from hypoventilation. - UA pending for UTI evaluation. - Note pt reports unsafe living conditions, validity of this is unclear, but I have consulted CSW for assistance with determining safe disposition venue.  COPD: No wheezing on my exam.  - Deescalate steroids quickly with concern for steroid-induced hyperglycemia and impact to psychiatric condition - Started on azithromycin at admission.  AKI on stage IIIa CKD: Baseline Cr 1.2-1.3, was 2.19 on admission and improved with diuretic to 1.91.  - Hold ACE inhibitor - Still improving slowly, still some volume excess, repeat lasix IV today. Continue to monitor UOP and BMP closely.   Hyperkalemia: Mild, resolved. - Holding ACE inhibitor, keep  cardiac monitoring.   Morbid obesity: Body mass index is 51.17 kg/m.  - With concomitant T2DM, could consider GLP1 agent.   T2DM: Last HbA1c 10.3% - Continue resistant SSI and basal insulin. Her home dose is listed as 45u BID and her fasting CBG remains quite elevated on once daily dosing, so will increase to  30u BID glargine today.  HTN:  - Continue home propranolol and imdur. Hold lisinopril with hyperkalemic AKI. If propranolol is only for HTN, should change to cardioselective agent with concomitant COPD.  Troponin elevation: Suspect demand myocardial ischemia as she has no chest pain and has respiratory failure. Troponins are trending downward.  - Monitor closely. Continue ASA, statin, beta blocker.   HLD:  - Continue statin  Tyrone Nine, MD [facility] [email or web address] 04/01/2023, 8:37 AM

## 2023-04-02 DIAGNOSIS — J9601 Acute respiratory failure with hypoxia: Secondary | ICD-10-CM | POA: Diagnosis not present

## 2023-04-02 LAB — GLUCOSE, CAPILLARY
Glucose-Capillary: 105 mg/dL — ABNORMAL HIGH (ref 70–99)
Glucose-Capillary: 237 mg/dL — ABNORMAL HIGH (ref 70–99)
Glucose-Capillary: 260 mg/dL — ABNORMAL HIGH (ref 70–99)
Glucose-Capillary: 259 mg/dL — ABNORMAL HIGH (ref 70–99)

## 2023-04-02 LAB — URINALYSIS, ROUTINE W REFLEX MICROSCOPIC
Bilirubin Urine: NEGATIVE
Glucose, UA: NEGATIVE mg/dL
Ketones, ur: NEGATIVE mg/dL
Nitrite: NEGATIVE
Protein, ur: 30 mg/dL — AB
Specific Gravity, Urine: 1.009 (ref 1.005–1.030)
pH: 5 (ref 5.0–8.0)

## 2023-04-02 LAB — BASIC METABOLIC PANEL
Anion gap: 7 (ref 5–15)
BUN: 54 mg/dL — ABNORMAL HIGH (ref 6–20)
CO2: 35 mmol/L — ABNORMAL HIGH (ref 22–32)
Calcium: 8.8 mg/dL — ABNORMAL LOW (ref 8.9–10.3)
Chloride: 97 mmol/L — ABNORMAL LOW (ref 98–111)
Creatinine, Ser: 1.5 mg/dL — ABNORMAL HIGH (ref 0.44–1.00)
GFR, Estimated: 40 mL/min — ABNORMAL LOW (ref >60)
Glucose, Bld: 132 mg/dL — ABNORMAL HIGH (ref 70–99)
Potassium: 4.2 mmol/L (ref 3.5–5.1)
Sodium: 139 mmol/L (ref 135–145)

## 2023-04-02 MED ORDER — INSULIN ASPART 100 UNIT/ML IJ SOLN
8.0000 [IU] | Freq: Three times a day (TID) | INTRAMUSCULAR | Status: DC
  Administered 2023-04-02 – 2023-04-04 (×8): 8 [IU] via SUBCUTANEOUS

## 2023-04-02 MED ORDER — LIVING WELL WITH DIABETES BOOK: Freq: Once | Status: AC

## 2023-04-02 MED ORDER — FUROSEMIDE 10 MG/ML IJ SOLN
40.0000 mg | Freq: Once | INTRAMUSCULAR | Status: AC
  Administered 2023-04-02: 40 mg via INTRAVENOUS
  Filled 2023-04-02: qty 4
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
## 2023-04-02 NOTE — Progress Notes (Signed)
Mobility Specialist Progress Note:   04/02/23 1143  Mobility  Activity Ambulated independently in room;Ambulated with assistance to bathroom  Level of Assistance Contact guard assist, steadying assist  Assistive Device Front wheel walker  Distance Ambulated (ft) 24 ft  Range of Motion/Exercises Active;All extremities  Activity Response Tolerated well  Mobility Referral Yes  $Mobility charge 1 Mobility  Mobility Specialist Start Time (ACUTE ONLY) 1130  Mobility Specialist Stop Time (ACUTE ONLY) 1140  Mobility Specialist Time Calculation (min) (ACUTE ONLY) 10 min   Pt received in chair, agreeable to mobility session. Ambulated to bathroom per request. Tolerated well, asx throughout. SpO2 90% on 2L during mobility. Returned pt to chair in room, all needs met, call bell in reach.   [name] Mobility Specialist Please contact via Special educational needs teacher or  Rehab office at [phone]

## 2023-04-02 NOTE — Progress Notes (Addendum)
SATURATION QUALIFICATIONS: (This note is used to comply with regulatory documentation for home oxygen)  Patient Saturations on Room Air at Rest = 87%  Patient Saturations on Room Air while Ambulating = refused ambulation  Patient Saturations 95 on 2 Liters of oxygen while sitting   Please briefly explain why patient needs home oxygen: Patient would benefit from home oxygen as evidenced by o2 sat dropped down to 87% while sitting in chair
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
## 2023-04-02 NOTE — Progress Notes (Addendum)
[facility] PROGRESS NOTE  [name] (DOB: Jun 12, 1966) ZOX:[id number] PCP: [facility]  Brief Narrative: [name] is a 57 y.o. female with a history of COPD, OHS, OSA, morbid obesity (BMI 51), HTN, T2DM, HLD, bipolar disorder with inpatient psychiatric hospitalization last month who presented to the ED on 03/30/2023 with shortness of breath, found to be hypoxic by EMS. She remained hypoxemic in the ED though CXR was clear. BNP was elevated. Lasix, steroids, and duonebs given and the patient admitted to the floor. She became confused prompting an ABG which showed respiratory acidosis for which BiPAP was started and she was moved to the SDU. With diuresis she has improved, we continue to wean her from supplemental oxygen and await formal psychiatry evaluation.  Subjective: O2 set to 2L O2 today, pt feels her breathing is back to normal, wants to leave today but willing to remain an additional day.   Objective: BP (!) 166/99 (BP Location: Left Arm)   Pulse 85   Temp 98.1 F (36.7 C) (Oral)   Resp 19   Ht 5' 7.2" (1.707 m)   Wt (!) 143.6 kg   SpO2 91%   BMI 49.29 kg/m   Gen: Obese female in no acute distress Pulm: Distant with bibasilar crackles, no wheezes  CV: RRR, no MRG, trace pitting edema GI: Soft, NT, ND, +BS  Neuro: Alert, oriented. No new focal deficits. Psych: Pressured speech, perseverative but redirectable, broad affect, anxious mood Ext: Warm, no deformities Skin: No new rashes, lesions or ulcers on visualized skin   Assessment & Plan: Acute hypoxic and hypercarbic respiratory failure: Suspect her pulmonary reserve is limited by OHS, OSA and COPD, though cause of acute decompensation is unclear. CXR is clear and though BNP is elevated, she does not appear terribly volume overloaded. She has no tachycardia, chest pain or DVT symptoms, though if hypoxia were to persist despite measures below, would need CTA for PE r/o.  - D-dimer  0.52. CTA chest reassuring, no PE or large infiltrate.  - Continue supplemental oxygen to maintain SpO2 88-95% and normal respiratory effort. Will wean to room air today, give additional lasix x1.  Bipolar disorder, concern for unsafe living conditions: Admitted to inpatient psych facility last month for acute psychosis, and has abnormal presentation at this time. Presented with acute psychosis 02/26/2023. There was some question of whether UTI precipitated this episode (as it was thought to have done in her index presentation for psychosis in Nov 2023). UA is still pending this admission. TTS at that time mentioned it is odd for psychosis to develop for the first time at such an age.  - Would appreciate psychiatry evaluation, TTS consulted. She continues to exhibit pressured speech with unclear delusions (says her spouse is Advertising copywriter and that she is pregnant).  - No home medications on list. Started zyprexa qHS which improved symptoms. - Note pt reports unsafe living conditions, validity of this is unclear, but I have consulted CSW for assistance with determining safe disposition venue.  COPD: No wheezing on my exam.  - Deescalate steroids quickly with concern for steroid-induced hyperglycemia and impact to psychiatric condition - Started on azithromycin at admission.  AKI on stage IIIa CKD: Baseline Cr 1.2-1.3, was 2.19 on admission and improved with diuretic to 1.91.  - Holding ACE inhibitor - Still improving slowly, still some volume excess, repeat lasix IV again today. Continue to monitor UOP and BMP closely.   Hyperkalemia: Mild, resolved. - Holding  ACE inhibitor, keep cardiac monitoring.   Morbid obesity: Body mass index is 49.29 kg/m.  - With concomitant T2DM, could consider GLP1 agent.   T2DM: Last HbA1c 10.3% - Continue resistant SSI and basal insulin. Fasting is at goal today, will add 8u TIDWC novolog to resistant SSI. Continue basal at 30u BID.   HTN:  - Continue home  propranolol and imdur. Holding lisinopril with hyperkalemic AKI. If propranolol is only for HTN, should change to cardioselective agent with concomitant COPD.  Troponin elevation: Suspect demand myocardial ischemia as she has no chest pain and has respiratory failure. Troponins are trending downward.  - Monitor closely. Continue ASA, statin, beta blocker.   HLD:  - Continue statin  Tyrone Nine, MD [facility] [email or web address] 04/02/2023, 11:36 AM

## 2023-04-02 NOTE — Progress Notes (Signed)
TTS consult done this shift.

## 2023-04-02 NOTE — Progress Notes (Signed)
Patient able to ambulate to bathroom with 1 assist . Patient has pressured speech at times during shift, and at times is very hard to understand. Compliant with care and able to make needs known, patient demanding at times throughout shift. Asking for bath and NT's bathed her earlier, she was fine with redirection. Denies pain or discomfort this shift.

## 2023-04-03 DIAGNOSIS — J9601 Acute respiratory failure with hypoxia: Secondary | ICD-10-CM | POA: Diagnosis not present

## 2023-04-03 LAB — BASIC METABOLIC PANEL
Anion gap: 10 (ref 5–15)
BUN: 40 mg/dL — ABNORMAL HIGH (ref 6–20)
CO2: 36 mmol/L — ABNORMAL HIGH (ref 22–32)
Calcium: 9 mg/dL (ref 8.9–10.3)
Chloride: 96 mmol/L — ABNORMAL LOW (ref 98–111)
Creatinine, Ser: 1.16 mg/dL — ABNORMAL HIGH (ref 0.44–1.00)
GFR, Estimated: 55 mL/min — ABNORMAL LOW (ref >60)
Glucose, Bld: 149 mg/dL — ABNORMAL HIGH (ref 70–99)
Potassium: 4 mmol/L (ref 3.5–5.1)
Sodium: 142 mmol/L (ref 135–145)

## 2023-04-03 LAB — GLUCOSE, CAPILLARY
Glucose-Capillary: 110 mg/dL — ABNORMAL HIGH (ref 70–99)
Glucose-Capillary: 211 mg/dL — ABNORMAL HIGH (ref 70–99)
Glucose-Capillary: 272 mg/dL — ABNORMAL HIGH (ref 70–99)
Glucose-Capillary: 230 mg/dL — ABNORMAL HIGH (ref 70–99)

## 2023-04-03 MED ORDER — FOSFOMYCIN TROMETHAMINE 3 G PO PACK
3.0000 g | PACK | Freq: Once | ORAL | Status: AC
  Administered 2023-04-03: 3 g via ORAL
  Filled 2023-04-03: qty 3

## 2023-04-03 NOTE — Progress Notes (Signed)
Mobility Specialist Progress Note:    04/03/23 1024  Mobility  Activity Transferred from chair to bed  Level of Assistance Contact guard assist, steadying assist  Assistive Device Front wheel walker  Distance Ambulated (ft) 4 ft  Range of Motion/Exercises Active;All extremities  Activity Response Tolerated well  Mobility Referral Yes  $Mobility charge 1 Mobility  Mobility Specialist Start Time (ACUTE ONLY) 0945  Mobility Specialist Stop Time (ACUTE ONLY) 0950  Mobility Specialist Time Calculation (min) (ACUTE ONLY) 5 min   Pt requested assistance to transfer C>B. Tolerated well, asx throughout. RW and CGA required for safety. Left pt in bed, alarm on, call bell in reach, all needs met.   Feliciana Rossetti Mobility Specialist Please contact via Special educational needs teacher or  Rehab office at [phone]

## 2023-04-03 NOTE — Plan of Care (Signed)
  Problem: Acute Rehab PT Goals(only PT should resolve) Goal: Pt Will Go Supine/Side To Sit Outcome: Progressing Flowsheets (Taken 04/03/2023 1356) Pt will go Supine/Side to Sit: with supervision Goal: Pt Will Go Sit To Supine/Side Outcome: Progressing Flowsheets (Taken 04/03/2023 1356) Pt will go Sit to Supine/Side: with supervision Goal: Patient Will Transfer Sit To/From Stand Outcome: Progressing Flowsheets (Taken 04/03/2023 1356) Patient will transfer sit to/from stand: with supervision Goal: Pt Will Transfer Bed To Chair/Chair To Bed Outcome: Progressing Flowsheets (Taken 04/03/2023 1356) Pt will Transfer Bed to Chair/Chair to Bed: with supervision Goal: Pt Will Ambulate Outcome: Progressing Flowsheets (Taken 04/03/2023 1356) Pt will Ambulate:  50 feet  with supervision  with least restrictive assistive device Goal: Pt/caregiver will Perform Home Exercise Program Outcome: Progressing Flowsheets (Taken 04/03/2023 1356) Pt/caregiver will Perform Home Exercise Program:  For increased strengthening  For improved balance  With Supervision, verbal cues required/provided    Katina Dung. Hartnett-Rands, MS, PT [facility] [phone] 04/03/2023

## 2023-04-03 NOTE — Progress Notes (Addendum)
[facility] PROGRESS NOTE  [name] (DOB: 1966/03/31) ZOX:[id number] PCP: [facility]  Brief Narrative: [name] is a 57 y.o. female with a history of COPD, OHS, OSA, morbid obesity (BMI 51), HTN, T2DM, HLD, bipolar disorder with inpatient psychiatric hospitalization last month who presented to the ED on 03/30/2023 with shortness of breath, found to be hypoxic by EMS. [name] remained hypoxemic in the ED though CXR was clear. BNP was elevated. Lasix, steroids, and duonebs given and the patient admitted to the floor. [name] became confused prompting an ABG which showed respiratory acidosis for which BiPAP was started and [name] was moved to the SDU. With diuresis [name] has improved, and her respiratory status is stable. [name] IS MEDICALLY CLEARED FOR DISPOSITION. Awaiting psychiatry recommendations regarding disposition. Anticipate unsafe discharge to home at this time.    Subjective: Breathing is fine, denies pain. Eating well. [name] is more disorganized today, stating [name] doesn't know whether [name] wants to live with Naranja (?sp) or Pancho who [name] says owns this hospital. [name] is afraid of one of them so [name]'s not telling them [name]'s pregnant.   Objective: BP (!) 136/55 (BP Location: Left Wrist)   Pulse 81   Temp 99.5 F (37.5 C) (Oral)   Resp 19   Ht 5' 7.2" (1.707 m)   Wt (!) 140.8 kg   SpO2 93%   BMI 48.33 kg/m   Gen: No distress Pulm: Clear, diminished without wheezes, nonlabored  CV: RRR, no pitting GI: Soft, NT, ND, +BS  Neuro: Alert without focal deficits. Psych: Broad affect, anxious mood, flight of ideas, pressured speech Ext: Warm, no deformities. Skin: No rashes, lesions or ulcers on visualized skin   Assessment & Plan: Acute hypoxic and hypercarbic respiratory failure: Suspect her pulmonary reserve is limited by OHS, OSA and COPD, though cause of acute decompensation is unclear. CXR is clear and though BNP is elevated, [name] does not appear terribly  volume overloaded. [name] has no tachycardia, chest pain or DVT symptoms, though if hypoxia were to persist despite measures below, would need CTA for PE r/o.  - D-dimer 0.52. CTA chest reassuring, no PE or large infiltrate.  - Continue supplemental oxygen to maintain SpO2 88-95% and normal respiratory effort.  - Continues to require 2L O2. [name] actually now reports that [name]'s had home oxygen 2L O2 before and feels [name]'s at her baseline.  Bipolar disorder, concern for unsafe living conditions: Admitted to inpatient psych facility last month for acute psychosis, and has abnormal presentation at this time. Presented with acute psychosis 02/26/2023.  - There was some question of whether UTI precipitated the previous episode (as it was thought to have done in her index presentation for psychosis in Nov 2023). UA with mild pyuria, will treat with fosfomycin x1.   - Would appreciate psychiatry evaluation, TTS consulted. Note 7/8 stated "Disposition: Royston Bake NP, recommends pt to be psychiatric cleared once medical cleared.  Disposition discussed with Chief Operating Officer.  RN to discuss with EDP." I have discussed with them at length, still awaiting clarification of recommendations. Her flight of ideas, delusional and disorganized thinking suggest [name] would not be safe to return home. [name] also has no phone and knows no way of contacting her family/friends.  - No home medications on list. Started zyprexa qHS which improved symptoms.  COPD: No wheezing on my exam.  - Deescalate steroids quickly with concern for steroid-induced hyperglycemia and impact to psychiatric condition - Started on azithromycin at admission.  AKI on stage IIIa CKD: Baseline Cr 1.2-1.3, was 2.19 on admission and improved with diuretic to 1.91.  - Holding ACE inhibitor - Still improving slowly, still some volume excess, repeat lasix IV again today. Continue to monitor UOP and BMP closely.   Hyperkalemia: Mild, resolved. - Holding ACE inhibitor,  keep cardiac monitoring.   Morbid obesity: Body mass index is 48.33 kg/m.  - With concomitant T2DM, could consider GLP1 agent.   T2DM: Last HbA1c 10.3% - Continue resistant SSI and basal insulin. Fasting is at goal today, will add 8u TIDWC novolog to resistant SSI. Continue basal at 30u BID.   HTN:  - Continue home propranolol and imdur. Holding lisinopril with hyperkalemic AKI. If propranolol is only for HTN, should change to cardioselective agent with concomitant COPD.  Troponin elevation: Suspect demand myocardial ischemia as [name] has no chest pain and has respiratory failure. Troponins are trending downward.  - Monitor closely. Continue ASA, statin, beta blocker.   HLD:  - Continue statin  Tyrone Nine, MD [facility] [email or web address] 04/03/2023, 3:50 PM

## 2023-04-03 NOTE — Evaluation (Signed)
Physical Therapy Evaluation Patient Details Name: [name] MRN: [id number] DOB: [birth year] Today's Date: 04/03/2023  History of Present Illness  [name] is a 57 y.o. female with a history of COPD, OHS, OSA, morbid obesity (BMI 51), HTN, T2DM, HLD, bipolar disorder with inpatient psychiatric hospitalization last month who presented to the ED on 03/30/2023 with shortness of breath, found to be hypoxic by EMS. She remained hypoxemic in the ED though CXR was clear. BNP was elevated. Lasix, steroids, and duonebs given and the patient admitted to the floor. She became confused prompting an ABG which showed respiratory acidosis for which BiPAP was started and she was moved to the SDU. With diuresis she has improved, we continue to wean her from supplemental oxygen and await formal psychiatry evaluation.   Clinical Impression  Pt admitted with above diagnosis. Patient sitting in recliner at beginning of session. Patient agreeable to participating in evaluation today. Patient reporting she needed to go to the bathroom. Patient requires min guard to supervision assistance for safety and line management in unfamiliar environment. Patient performed transfers and short distance ambulation to/from the bathroom using RW and min guard to supervision. Patient fearful of ambulating unless someone is physically near her during ambulation. Patient reports she is a household ambulator only at baseline. Pt currently with functional limitations due to the deficits listed below (see PT Problem List). Pt will benefit from acute skilled PT to increase their independence and safety with mobility to allow discharge.           Assistance Recommended at Discharge Intermittent Supervision/Assistance  If plan is discharge home, recommend the following:  Can travel by private vehicle  A little help with walking and/or transfers;A little help with bathing/dressing/bathroom;Assistance with cooking/housework;Assist for  transportation;Help with stairs or ramp for entrance        Equipment Recommendations None recommended by PT  Recommendations for Other Services       Functional Status Assessment Patient has had a recent decline in their functional status and demonstrates the ability to make significant improvements in function in a reasonable and predictable amount of time.     Precautions / Restrictions Precautions Precautions: Fall Restrictions Weight Bearing Restrictions: No      Mobility  Bed Mobility Overal bed mobility: Needs Assistance Bed Mobility: Sit to Supine       Sit to supine: Supervision   General bed mobility comments: patient able to perform sit to supine with supervision but then requested HOB be elevated vs lying back.    Transfers Overall transfer level: Needs assistance Equipment used: Rolling walker (2 wheels) Transfers: Sit to/from Stand Sit to Stand: Min guard           General transfer comment: min guard for safety    Ambulation/Gait Ambulation/Gait assistance: Min guard Gait Distance (Feet): 20 Feet (10 feet x2) Assistive device: Rolling walker (2 wheels) Gait Pattern/deviations: Step-through pattern, Decreased step length - right, Decreased step length - left, Decreased stride length, Trunk flexed (due to need of a bariatric walker) Gait velocity: decreased     General Gait Details: slow, labored cadence using RW demonstrating shortness of breath/dyspnea on exertion; limited by fatigue; on 2 LPM O2 throughout  Stairs            Wheelchair Mobility     Tilt Bed    Modified Rankin (Stroke Patients Only)       Balance Overall balance assessment: Mild deficits observed, not formally tested  Pertinent Vitals/Pain Pain Assessment Breathing: normal Negative Vocalization: none Facial Expression: smiling or inexpressive Body Language: relaxed Consolability: no need to console PAINAD Score: 0    Home Living  Family/patient expects to be discharged to:: Private residence Living Arrangements: Spouse/significant other Available Help at Discharge: Available PRN/intermittently Type of Home: House           Home Equipment: Agricultural consultant (2 wheels) Additional Comments: patient unsure of the house set up and equipment she will have available depending on where she ends up living after this admission    Prior Function Prior Level of Function : Needs assist  Cognitive Assist : Mobility (cognitive)           Mobility Comments: patient fearful of walking without someone standing by her; household ambulator       Hand Dominance        Extremity/Trunk Assessment   Upper Extremity Assessment Upper Extremity Assessment: Overall WFL for tasks assessed    Lower Extremity Assessment Lower Extremity Assessment: Overall WFL for tasks assessed    Cervical / Trunk Assessment Cervical / Trunk Assessment: Normal  Communication   Communication: Expressive difficulties  Cognition Arousal/Alertness: Awake/alert Behavior During Therapy: WFL for tasks assessed/performed Overall Cognitive Status: History of cognitive impairments - at baseline      General Comments      Exercises     Assessment/Plan    PT Assessment Patient needs continued PT services  PT Problem List Decreased strength;Decreased balance;Decreased mobility;Decreased activity tolerance;Decreased safety awareness       PT Treatment Interventions DME instruction;Functional mobility training;Balance training;Patient/family education;Gait training;Therapeutic activities;Therapeutic exercise;Cognitive remediation    PT Goals (Current goals can be found in the Care Plan section)  Acute Rehab PT Goals Patient Stated Goal: Go home with assistance. PT Goal Formulation: With patient Time For Goal Achievement: 04/17/23 Potential to Achieve Goals: Fair    Frequency Min 3X/week        AM-PAC PT "6 Clicks" Mobility  Outcome  Measure Help needed turning from your back to your side while in a flat bed without using bedrails?: A Little Help needed moving from lying on your back to sitting on the side of a flat bed without using bedrails?: A Little Help needed moving to and from a bed to a chair (including a wheelchair)?: A Little Help needed standing up from a chair using your arms (e.g., wheelchair or bedside chair)?: A Little Help needed to walk in hospital room?: A Little Help needed climbing 3-5 steps with a railing? : A Lot 6 Click Score: 17    End of Session Equipment Utilized During Treatment: Oxygen Activity Tolerance: Patient tolerated treatment well;Patient limited by fatigue Patient left: in bed;with call bell/phone within reach;with bed alarm set Nurse Communication: Mobility status PT Visit Diagnosis: Unsteadiness on feet (R26.81);Other abnormalities of gait and mobility (R26.89);Difficulty in walking, not elsewhere classified (R26.2)    Time: 0865-7846 PT Time Calculation (min) (ACUTE ONLY): 29 min   Charges:   PT Evaluation $PT Eval Low Complexity: 1 Low PT Treatments $Therapeutic Activity: 8-22 mins PT General Charges $$ ACUTE PT VISIT: 1 Visit         Katina Dung. Hartnett-Rands, MS, PT [facility]  [phone]  Britta Mccreedy  Hartnett-Rands 04/03/2023, 1:50 PM

## 2023-04-04 ENCOUNTER — Inpatient Hospital Stay (HOSPITAL_COMMUNITY): Payer: Medicaid Other

## 2023-04-04 DIAGNOSIS — E1165 Type 2 diabetes mellitus with hyperglycemia: Secondary | ICD-10-CM | POA: Diagnosis not present

## 2023-04-04 DIAGNOSIS — N179 Acute kidney failure, unspecified: Secondary | ICD-10-CM | POA: Diagnosis not present

## 2023-04-04 DIAGNOSIS — J9601 Acute respiratory failure with hypoxia: Secondary | ICD-10-CM | POA: Diagnosis not present

## 2023-04-04 LAB — BASIC METABOLIC PANEL
Anion gap: 9 (ref 5–15)
BUN: 34 mg/dL — ABNORMAL HIGH (ref 6–20)
CO2: 36 mmol/L — ABNORMAL HIGH (ref 22–32)
Calcium: 9.1 mg/dL (ref 8.9–10.3)
Chloride: 96 mmol/L — ABNORMAL LOW (ref 98–111)
Creatinine, Ser: 1.16 mg/dL — ABNORMAL HIGH (ref 0.44–1.00)
GFR, Estimated: 55 mL/min — ABNORMAL LOW (ref >60)
Glucose, Bld: 117 mg/dL — ABNORMAL HIGH (ref 70–99)
Potassium: 4.2 mmol/L (ref 3.5–5.1)
Sodium: 141 mmol/L (ref 135–145)

## 2023-04-04 LAB — GLUCOSE, CAPILLARY
Glucose-Capillary: 136 mg/dL — ABNORMAL HIGH (ref 70–99)
Glucose-Capillary: 334 mg/dL — ABNORMAL HIGH (ref 70–99)
Glucose-Capillary: 322 mg/dL — ABNORMAL HIGH (ref 70–99)
Glucose-Capillary: 249 mg/dL — ABNORMAL HIGH (ref 70–99)

## 2023-04-04 MED ORDER — OLANZAPINE 5 MG PO TABS
10.0000 mg | ORAL_TABLET | Freq: Every day | ORAL | Status: DC
  Administered 2023-04-04: 10 mg via ORAL
  Filled 2023-04-04: qty 2

## 2023-04-04 MED ORDER — INSULIN ASPART 100 UNIT/ML IJ SOLN
12.0000 [IU] | Freq: Three times a day (TID) | INTRAMUSCULAR | Status: DC
  Administered 2023-04-04 – 2023-04-05 (×4): 12 [IU] via SUBCUTANEOUS

## 2023-04-04 NOTE — Progress Notes (Addendum)
PROGRESS NOTE  [name] ZOX:[id number] DOB: October 27, 1965 DOA: 03/30/2023 PCP: [facility]  Brief History:  57 year old female with a history of cognitive impairment, bipolar disorder, seizure disorder, hypertension, COPD, OSA/OHS, chronic respiratory failure on 2-3 L, diabetes mellitus type 2, CKD stage III, and right shoulder dislocation presenting with shortness of breath.   She found to be hypoxic by EMS. She remained hypoxemic in the ED though CXR was clear. BNP was elevated. Lasix, steroids, and duonebs given and the patient admitted to the floor. She became confused prompting an ABG which showed respiratory acidosis for which BiPAP was started and she was moved to the SDU. With diuresis she has improved, and her respiratory status is stable. SHE IS MEDICALLY CLEARED FOR DISPOSITION. Awaiting psychiatry recommendations regarding disposition. Anticipate unsafe discharge to home at this time.     Assessment/Plan: Acute on chronic respiratory failure with hypoxia and hypercarbia -03/31/2023 ABG 7.31/61/73/30 -Multifactorial including OSA/OHS, fluid overload, atelectasis -She has never smoked. -Chronically on 2-3 L at home. -Initially placed on BiPAP -7/7 CTA chest negative PE, subsegmental atelectasis in the lung bases.  No pulmonary edema or effusion.  GGO in the right apex. -03/31/2023 echo EF 60 to 65%, no WMA, grade 1 DD, trivial TR -Minimize opioids and hypnotic medications -wean oxygen for saturation 88-92% -improved with IV lasix x 3 -d/c solumedrol  Acute on chronic renal failure--CKD 3 a -Baseline creatinine 1.1-1.4 -Serum creatinine peaked 2.19 -Improved with diuresis suggesting venous congestion -hold ACEi  Bipolar disorder -Minimize hypnotic medications - Admitted to inpatient psych facility 02/2023 for acute psychosis, and has abnormal presentation at this time. Presented with acute psychosis 02/26/2023.  - Would appreciate psychiatry  evaluation, TTS consulted. Note 7/8 stated "Disposition: Royston Bake NP, recommends pt to be psychiatric cleared once medical cleared.  Disposition discussed with Chief Operating Officer.  RN to discuss with EDP." I have discussed with them at length, still awaiting clarification of recommendations. Her flight of ideas, delusional and disorganized thinking suggest she would not be safe to return home. She also has no phone and knows no way of contacting her family/friends.  - No home medications on list. Started zyprexa qHS which improved symptoms (previously on abilify and cymbalta)  Uncontrolled diabetes mellitus type 2 with hyperglycemia -02/26/23 A1c--10.3 -increase semglee to 35 units -NovoLog sliding scale -novolog 12 units with meals  Hyperkalemia:  -Mild, resolved. - Holding ACE inhibitor, keep cardiac monitoring.    Morbid Obesity -BMI 48.33 -lifestyle modification  Essential Hypertension -restart inderal and imdur   Hyperlipidemia -restart statin         Family Communication:  Called all numbers listed--no answer  Consultants:  TTS  Code Status:  FULL   DVT Prophylaxis:  [place] Lovenox   Procedures: As Listed in Progress Note Above  Antibiotics: Fosfomycin 7/9 x 1    Total time spent 50 minutes.  Greater than 50% spent face to face counseling and coordinating care.   Subjective: Patient denies fevers, chills, headache, chest pain, dyspnea, nausea, vomiting, diarrhea, abdominal pain, dysuria   Objective: Vitals:   04/03/23 2033 04/04/23 0500 04/04/23 0632 04/04/23 0810  BP: (!) 162/69  (!) 149/75 (!) 153/94  Pulse: 86  83 90  Resp: 20  18   Temp: 98.8 F (37.1 C)  99.6 F (37.6 C)   TempSrc: Oral  Oral   SpO2: 93%  92%   Weight:  134.5 kg  Height:        Intake/Output Summary (Last 24 hours) at 04/04/2023 1656 Last data filed at 04/04/2023 1100 Gross per 24 hour  Intake 720 ml  Output 2700 ml  Net -1980 ml   Weight change: -6.3 kg Exam:  General:  Pt  is alert, follows commands appropriately, not in acute distress HEENT: No icterus, No thrush, No neck mass, [place]/AT Cardiovascular: RRR, S1/S2, no rubs, no gallops Respiratory: CTA bilaterally, no wheezing, no crackles, no rhonchi Abdomen: Soft/+BS, non tender, non distended, no guarding Extremities: 1 + LE edema, No lymphangitis, No petechiae, No rashes, no synovitis   Data Reviewed: I have personally reviewed following labs and imaging studies Basic Metabolic Panel: Recent Labs  Lab 03/31/23 0345 04/01/23 0354 04/02/23 0435 04/03/23 0401 04/04/23 0450  NA 136 137 139 142 141  K 5.2* 4.8 4.2 4.0 4.2  CL 98 97* 97* 96* 96*  CO2 26 30 35* 36* 36*  GLUCOSE 335* 249* 132* 149* 117*  BUN 59* 63* 54* 40* 34*  CREATININE 1.91* 1.73* 1.50* 1.16* 1.16*  CALCIUM 8.7* 8.7* 8.8* 9.0 9.1  MG 2.3  --   --   --   --   PHOS 5.4*  --   --   --   --    Liver Function Tests: Recent Labs  Lab 03/31/23 0345  AST 36  ALT 56*  ALKPHOS 132*  BILITOT 0.5  PROT 6.7  ALBUMIN 3.3*   No results for input(s): "LIPASE", "AMYLASE" in the last 168 hours. No results for input(s): "AMMONIA" in the last 168 hours. Coagulation Profile: No results for input(s): "INR", "PROTIME" in the last 168 hours. CBC: Recent Labs  Lab 03/30/23 1810 03/31/23 0345  WBC 6.7 4.9  HGB 10.7* 11.2*  HCT 36.5 38.2  MCV 95.3 95.3  PLT 252 231   Cardiac Enzymes: No results for input(s): "CKTOTAL", "CKMB", "CKMBINDEX", "TROPONINI" in the last 168 hours. BNP: Invalid input(s): "POCBNP" CBG: Recent Labs  Lab 04/03/23 1049 04/03/23 1656 04/03/23 2035 04/04/23 0731 04/04/23 1146  GLUCAP 211* 272* 230* 136* 334*   HbA1C: No results for input(s): "HGBA1C" in the last 72 hours. Urine analysis:    Component Value Date/Time   COLORURINE STRAW (A) 04/02/2023 1230   APPEARANCEUR CLEAR 04/02/2023 1230   LABSPEC 1.009 04/02/2023 1230   PHURINE 5.0 04/02/2023 1230   GLUCOSEU NEGATIVE 04/02/2023 1230   HGBUR SMALL  (A) 04/02/2023 1230   BILIRUBINUR NEGATIVE 04/02/2023 1230   KETONESUR NEGATIVE 04/02/2023 1230   PROTEINUR 30 (A) 04/02/2023 1230   UROBILINOGEN 0.2 04/11/2014 1927   NITRITE NEGATIVE 04/02/2023 1230   LEUKOCYTESUR MODERATE (A) 04/02/2023 1230   Sepsis Labs: @LABRCNTIP (procalcitonin:4,lacticidven:4) ) Recent Results (from the past 240 hour(s))  MRSA Next Gen by PCR, Nasal     Status: Abnormal   Collection Time: 03/31/23  3:24 AM   Specimen: Nasal Mucosa; Nasal Swab  Result Value Ref Range Status   MRSA by PCR Next Gen DETECTED (A) NOT DETECTED Final    Comment: RESULT CALLED TO, READ BACK BY AND VERIFIED WITH: HYLTON L @ 0751 ON [id number] (NOTE) The GeneXpert MRSA Assay (FDA approved for NASAL specimens only), is one component of a comprehensive MRSA colonization surveillance program. It is not intended to diagnose MRSA infection nor to guide or monitor treatment for MRSA infections. Test performance is not FDA approved in patients less than 51 years old. Performed at [facility], [address], [place], [place] [address]  Scheduled Meds:  aspirin EC  81 mg Oral Daily   Chlorhexidine Gluconate Cloth  6 each Topical Q0600   dextromethorphan-guaiFENesin  1 tablet Oral BID   enoxaparin (LOVENOX) injection  70 mg Subcutaneous Q24H   insulin aspart  0-20 Units Subcutaneous TID WC   insulin aspart  0-5 Units Subcutaneous QHS   insulin aspart  8 Units Subcutaneous TID WC   insulin glargine-yfgn  30 Units Subcutaneous BID   isosorbide mononitrate  30 mg Oral Daily   methylPREDNISolone (SOLU-MEDROL) injection  40 mg Intravenous Q24H   mupirocin ointment  1 Application Nasal BID   OLANZapine  5 mg Oral QHS   pantoprazole  40 mg Oral Daily   propranolol  60 mg Oral Daily   rosuvastatin  20 mg Oral Daily   Continuous Infusions:  Procedures/Studies: CT Angio Chest Pulmonary Embolism (PE) W or WO Contrast  Result Date: 04/01/2023 CLINICAL DATA:  Positive D-dimer. Clinical  concern for pulmonary embolus. EXAM: CT ANGIOGRAPHY CHEST WITH CONTRAST TECHNIQUE: Multidetector CT imaging of the chest was performed using the standard protocol during bolus administration of intravenous contrast. Multiplanar CT image reconstructions and MIPs were obtained to evaluate the vascular anatomy. RADIATION DOSE REDUCTION: This exam was performed according to the departmental dose-optimization program which includes automated exposure control, adjustment of the mA and/or kV according to patient size and/or use of iterative reconstruction technique. CONTRAST:  75mL OMNIPAQUE IOHEXOL 350 MG/ML SOLN COMPARISON:  04/27/2021 FINDINGS: Cardiovascular: The heart size is upper normal to borderline enlarged. No substantial pericardial effusion. Mild atherosclerotic calcification is noted in the wall of the thoracic aorta. There is no filling defect within the opacified pulmonary arteries to suggest the presence of an acute pulmonary embolus. Mediastinum/Nodes: No mediastinal lymphadenopathy. 12 mm short axis right hilar lymph node is upper normal to mildly enlarged. No left hilar lymphadenopathy. The esophagus has normal imaging features. There is no axillary lymphadenopathy. Lungs/Pleura: Subsegmental atelectasis noted in the dependent lung bases. No focal airspace consolidation. No pulmonary edema or pleural effusion. No suspicious pulmonary nodule or mass. Patchy ground-glass attenuation in the right lung apex may be infectious/inflammatory. Upper Abdomen: Small cyst upper pole left kidney is similar to prior. No followup imaging is recommended. Musculoskeletal: No worrisome lytic or sclerotic osseous abnormality. Review of the MIP images confirms the above findings. IMPRESSION: 1. No CT evidence for acute pulmonary embolus. 2. Patchy ground-glass attenuation in the right lung apex may be infectious/inflammatory. 3. Borderline to mildly enlarged right hilar lymph node, likely reactive. Follow-up CT chest with  contrast in 3 months recommended to ensure resolution. 4.  Aortic Atherosclerosis (ICD10-I70.0). Electronically Signed   By: Kennith Center M.D.   On: 04/01/2023 11:44   ECHOCARDIOGRAM COMPLETE  Result Date: 03/31/2023    ECHOCARDIOGRAM REPORT   Patient Name:   [name] Date of Exam: 03/31/2023 Medical Rec #:  [id number]       Height:       66.0 in Accession #:    [id number]      Weight:       321.6 lb Date of Birth:  05/06/1966       BSA:          2.447 m Patient Age:    57 years        BP:           176/86 mmHg Patient Gender: F               HR:  85 bpm. [facility] Procedure: 2D Echo, Cardiac Doppler and Color Doppler Indications:    CHF-Acute Diastolic I50.31  History:        Patient has prior history of Echocardiogram examinations, most                 recent 04/23/2021. COPD; Risk Factors:Hypertension, Diabetes and                 Dyslipidemia. Acute respiratory failure with hypoxia (HCC),                 Elevated brain natriuretic peptide (BNP) and Troponin levels,                 Bipolar disorder, current episode manic severe with psychotic                 features (HCC).  Sonographer:    Celesta Gentile RCS Referring Phys: [id number] OLADAPO ADEFESO IMPRESSIONS  1. Left ventricular ejection fraction, by estimation, is 60 to 65%. The left ventricle has normal function. The left ventricle has no regional wall motion abnormalities. There is mild left ventricular hypertrophy. Left ventricular diastolic parameters are consistent with Grade I diastolic dysfunction (impaired relaxation).  2. Right ventricular systolic function is normal. The right ventricular size is normal.  3. Right atrial size was mildly dilated.  4. The mitral valve is abnormal. No evidence of mitral valve regurgitation. No evidence of mitral stenosis. Moderate mitral annular calcification.  5. The aortic valve is tricuspid. There is mild calcification of the aortic valve. There is mild thickening of the aortic valve.  Aortic valve regurgitation is not visualized. Aortic valve sclerosis is present, with no evidence of aortic valve stenosis.  6. The inferior vena cava is dilated in size with >50% respiratory variability, suggesting right atrial pressure of 8 mmHg. FINDINGS  Left Ventricle: Left ventricular ejection fraction, by estimation, is 60 to 65%. The left ventricle has normal function. The left ventricle has no regional wall motion abnormalities. The left ventricular internal cavity size was normal in size. There is  mild left ventricular hypertrophy. Left ventricular diastolic parameters are consistent with Grade I diastolic dysfunction (impaired relaxation). Right Ventricle: The right ventricular size is normal. No increase in right ventricular wall thickness. Right ventricular systolic function is normal. Left Atrium: Left atrial size was normal in size. Right Atrium: Right atrial size was mildly dilated. Pericardium: There is no evidence of pericardial effusion. Mitral Valve: The mitral valve is abnormal. There is mild thickening of the mitral valve leaflet(s). Moderate mitral annular calcification. No evidence of mitral valve regurgitation. No evidence of mitral valve stenosis. MV peak gradient, 12.8 mmHg. The mean mitral valve gradient is 3.0 mmHg. Tricuspid Valve: The tricuspid valve is normal in structure. Tricuspid valve regurgitation is trivial. No evidence of tricuspid stenosis. Aortic Valve: The aortic valve is tricuspid. There is mild calcification of the aortic valve. There is mild thickening of the aortic valve. Aortic valve regurgitation is not visualized. Aortic valve sclerosis is present, with no evidence of aortic valve stenosis. Pulmonic Valve: The pulmonic valve was normal in structure. Pulmonic valve regurgitation is mild. No evidence of pulmonic stenosis. Aorta: The aortic root is normal in size and structure. Venous: The inferior vena cava is dilated in size with greater than 50% respiratory  variability, suggesting right atrial pressure of 8 mmHg. IAS/Shunts: The interatrial septum appears to be lipomatous. No atrial level shunt detected by color flow Doppler.  LEFT VENTRICLE  PLAX 2D LVIDd:         4.80 cm   Diastology LVIDs:         2.10 cm   LV e' medial:    7.18 cm/s LV PW:         1.20 cm   LV E/e' medial:  21.2 LV IVS:        1.30 cm   LV e' lateral:   11.90 cm/s LVOT diam:     1.90 cm   LV E/e' lateral: 12.8 LV SV:         116 LV SV Index:   47 LVOT Area:     2.84 cm  RIGHT VENTRICLE RV S prime:     18.00 cm/s TAPSE (M-mode): 2.7 cm LEFT ATRIUM           Index        RIGHT ATRIUM           Index LA diam:      3.70 cm 1.51 cm/m   RA Area:     23.00 cm LA Vol (A2C): 76.4 ml 31.22 ml/m  RA Volume:   81.40 ml  33.26 ml/m LA Vol (A4C): 64.4 ml 26.31 ml/m  AORTIC VALVE LVOT Vmax:   202.00 cm/s LVOT Vmean:  135.000 cm/s LVOT VTI:    0.410 m  AORTA Ao Root diam: 3.40 cm MITRAL VALVE MV Area (PHT): 3.72 cm     SHUNTS MV Area VTI:   2.70 cm     Systemic VTI:  0.41 m MV Peak grad:  12.8 mmHg    Systemic Diam: 1.90 cm MV Mean grad:  3.0 mmHg MV Vmax:       1.79 m/s MV Vmean:      75.3 cm/s MV Decel Time: 204 msec MV E velocity: 152.00 cm/s MV A velocity: 181.00 cm/s MV E/A ratio:  0.84 Charlton Haws MD Electronically signed by Charlton Haws MD Signature Date/Time: 03/31/2023/1:14:09 PM    Final    DG Chest Port 1 View  Result Date: 03/30/2023 CLINICAL DATA:  Short of breath. EXAM: PORTABLE CHEST 1 VIEW COMPARISON:  07/08/2022 and older studies. FINDINGS: Cardiac silhouette is normal in size. No mediastinal or hilar masses. Lungs are clear. No convincing pleural effusion and no pneumothorax. Skeletal structures are grossly intact. IMPRESSION: No active disease. Electronically Signed   By: Amie Portland M.D.   On: 03/30/2023 18:46    Catarina Hartshorn, DO  [facility]  If 7PM-7AM, please contact night-coverage [email or web address] Password [facility] 04/04/2023, 4:56 PM   LOS: 5 days

## 2023-04-04 NOTE — TOC Progression Note (Signed)
Transition of Care [facility]) - Progression Note    Patient Details  Name: [name] MRN: [id number] Date of Birth: [birth year]  [facility] CM/SW Contact  Karn [place], [place] Phone Number: 04/04/2023, 2:32 PM  Clinical Narrative:  MD ordered repeat TTS evaluation. LCSW left voicemail for TTS to see if evaluation will be completed today. Awaiting return call.      Expected Discharge Plan: Home/Self Care Barriers to Discharge: Continued Medical Work up  Expected Discharge Plan and Services In-house Referral: Clinical Social Work     Living arrangements for the past 2 months: Single Family Home                                       Social Determinants of Health (SDOH) Interventions SDOH Screenings   Food Insecurity: Patient Declined (04/01/2023)  Housing: High Risk (04/01/2023)  Transportation Needs: Patient Unable To Answer (04/01/2023)  Utilities: Patient Unable To Answer (04/01/2023)  Tobacco Use: Low Risk  (03/30/2023)    Readmission Risk Interventions    04/02/2023    1:43 PM 04/25/2021    1:27 PM  Readmission Risk Prevention Plan  Transportation Screening Complete Complete  Home Care Screening  Complete  Medication Review (RN CM)  Complete  HRI or Home Care Consult Complete   Social Work Consult for Recovery Care Planning/Counseling Complete   Palliative Care Screening Not Applicable   Medication Review Oceanographer) Complete

## 2023-04-04 NOTE — Hospital Course (Signed)
57 year old female with a history of cognitive impairment, bipolar disorder, seizure disorder, hypertension, COPD, OSA/OHS, chronic respiratory failure on 2-3 L, diabetes mellitus type 2, CKD stage III, and right shoulder dislocation presenting with shortness of breath.   She found to be hypoxic by EMS. She remained hypoxemic in the ED though CXR was clear. BNP was elevated. Lasix, steroids, and duonebs given and the patient admitted to the floor. She became confused prompting an ABG which showed respiratory acidosis for which BiPAP was started and she was moved to the SDU. With diuresis she has improved, and her respiratory status is stable. SHE IS MEDICALLY CLEARED FOR DISPOSITION. Awaiting psychiatry recommendations regarding disposition. Patient's olanzapine was titrated up to 10 mg q hs.  Pt seemed more calm.  TTS ultimately cleared patient for discharge with olanzapine and oxcarbazine 300 mg bid.

## 2023-04-04 NOTE — Progress Notes (Signed)
Mobility Specialist Progress Note:   04/04/23 1008  Mobility  Activity Transferred from chair to bed  Level of Assistance Contact guard assist, steadying assist  Assistive Device Front wheel walker  Distance Ambulated (ft) 4 ft  Range of Motion/Exercises Active;All extremities  Activity Response Tolerated well  Mobility Referral Yes  $Mobility charge 1 Mobility  Mobility Specialist Start Time (ACUTE ONLY) 1000  Mobility Specialist Stop Time (ACUTE ONLY) 1008  Mobility Specialist Time Calculation (min) (ACUTE ONLY) 8 min   Pt received in chair, requesting assistance back to bed. Transferred C>B via RW and CGA for safety. Tolerated transfer well, audible SOB throughout. SpO2 85% on 3L during mobility. Pt recovered, SpO2 93% on 3L. Left pt in bed, alarm on, all needs met, nursing notified.   Feliciana Rossetti Mobility Specialist Please contact via Special educational needs teacher or  Rehab office at [phone]

## 2023-04-04 NOTE — Consult Note (Addendum)
Telepsych Consultation   Reason for Consult:  delusions in setting of history Bipolar disorder Referring Physician:  Dr Arbutus Leas Location of Patient: [facility] Location of Provider: Behavioral Health TTS Department  Patient Identification: [name] MRN:  [id number] Principal Diagnosis: Acute respiratory failure with hypoxia [facility]) Diagnosis:  Principal Problem:   Acute respiratory failure with hypoxia (HCC) Active Problems:   Uncontrolled type 2 diabetes mellitus with hyperglycemia, with long-term current use of insulin (HCC)   Acute kidney injury superimposed on chronic kidney disease (HCC)   Essential hypertension   Morbid obesity with BMI of 50.0-59.9, adult (HCC)   Elevated brain natriuretic peptide (BNP) level   Elevated troponin   COPD (chronic obstructive pulmonary disease) (HCC)   Mixed hyperlipidemia   Total Time spent with patient: 45 minutes  Subjective: Patient states "my name is [name], I have changed my name, I will change it on everything."   HPI: [name] is a 57 y.o. female patient admitted with shortness of breath to emergency department on 04/01/2023. Patient is assessed by this nurse practitioner virtually, via telepsychiatry monitor.  She is seated in chair, no apparent distress.  She is alert and oriented.  While oriented she reports she would like to change her name from [name] to [name].  She continues to report she would like to change her name throughout assessment.  She is initially pleasant and cooperative with assessment.  Patient gives verbal consent to complete assessment with her adult daughter, Trula Ore, at bedside.    Patient quickly becomes anxious during assessment.  Mood labile, appears to become tearful.  When attempting to review current treatment plan to include potential discharge to nursing facility patient's speech becomes rapid and pressured.  She insists she does not want to go home to her husband but would like to go  to the home of "my first love, [name]."  Patient's daughter reports patient has continuously spoke of first love for approximately 1 year, patient's daughter has never met this person.  Potential fixed delusion surrounding [name].  No suicidal or homicidal ideations reported.  No auditory or visual hallucinations.  There is no indication patient is responding to internal stimuli currently.  Patient resides in [place] with her husband.  She denies access to weapons.  She receives disability income.  She denies alcohol and substance use.  Patient and family offered support and encouragement.  She gives verbal consent to speak with her daughter, Trula Ore.  Patient's daughter, Trula Ore, reports current delusional thought content ongoing times approximately 1 year.  Patient's daughter reports rapid and pressured speech frequent x 1 month.  Patient has documented bipolar 1 disorder history. Patient admitted for inpatient psychiatric treatment approx one month ago at [facility]. Daughter unable to appreciate improvement upon discharge.   Patient's daughter has attempted to follow-up with outpatient psychiatry.  Most recently patient unable to participate in outpatient psychiatry visit related to mood.    Past Psychiatric History: Bipolar 1 disorder, adjustment disorder with mixed disturbance of emotions and conduct, acute psychosis  Risk to Self:   Denies Risk to Others:   Denies Prior Inpatient Therapy:  Most recently admitted to [facility] approximately 1 month ago. Prior Outpatient Therapy:  Not currently linked with outpatient psychiatry  Past Medical History:  Past Medical History:  Diagnosis Date   Agitation 07/28/2022   Anxiety    COPD (chronic obstructive pulmonary disease) (HCC)    Diabetes mellitus    Fear of    falling  Hemophilia (HCC)    Hypertension    Panic attacks    Parkinson's disease    Seizures (HCC)     Past Surgical History:  Procedure  Laterality Date   ABDOMINAL HYSTERECTOMY     OOPHORECTOMY     SHOULDER CLOSED REDUCTION Right 04/22/2021   Procedure: CLOSED REDUCTION SHOULDER;  Surgeon: Vickki Hearing, MD;  Location: [facility];  Service: Orthopedics;  Laterality: Right;   SHOULDER CLOSED REDUCTION Right 04/25/2021   Procedure: CLOSED REDUCTION SHOULDER;  Surgeon: Vickki Hearing, MD;  Location: [facility];  Service: Orthopedics;  Laterality: Right;   Family History: History reviewed. No pertinent family history. Family Psychiatric  History: None reported Social History:  Social History   Substance and Sexual Activity  Alcohol Use No     Social History   Substance and Sexual Activity  Drug Use No    Social History   Socioeconomic History   Marital status: Married    Spouse name: Not on file   Number of children: Not on file   Years of education: Not on file   Highest education level: Not on file  Occupational History   Not on file  Tobacco Use   Smoking status: Never   Smokeless tobacco: Never  Substance and Sexual Activity   Alcohol use: No   Drug use: No   Sexual activity: Yes    Birth control/protection: Surgical  Other Topics Concern   Not on file  Social History Narrative   Not on file   Social Determinants of Health   Financial Resource Strain: Not on file  Food Insecurity: Patient Declined (04/01/2023)   Hunger Vital Sign    Worried About Running Out of Food in the Last Year: Patient declined    Ran Out of Food in the Last Year: Patient declined  Transportation Needs: Patient Unable To Answer (04/01/2023)   PRAPARE - Administrator, Civil Service (Medical): Patient unable to answer    Lack of Transportation (Non-Medical): Patient unable to answer  Physical Activity: Not on file  Stress: Not on file  Social Connections: Not on file   Additional Social History:    Allergies:   Allergies  Allergen Reactions   Bee Venom Hives    Labs:  Results for orders placed or  performed during the hospital encounter of 03/30/23 (from the past 48 hour(s))  Glucose, capillary     Status: Abnormal   Collection Time: 04/02/23  7:29 PM  Result Value Ref Range   Glucose-Capillary 259 (H) 70 - 99 mg/dL    Comment: Glucose reference range applies only to samples taken after fasting for at least 8 hours.  Basic metabolic panel     Status: Abnormal   Collection Time: 04/03/23  4:01 AM  Result Value Ref Range   Sodium 142 135 - 145 mmol/L   Potassium 4.0 3.5 - 5.1 mmol/L   Chloride 96 (L) 98 - 111 mmol/L   CO2 36 (H) 22 - 32 mmol/L   Glucose, Bld 149 (H) 70 - 99 mg/dL    Comment: Glucose reference range applies only to samples taken after fasting for at least 8 hours.   BUN 40 (H) 6 - 20 mg/dL   Creatinine, Ser 1.61 (H) 0.44 - 1.00 mg/dL   Calcium 9.0 8.9 - 09.6 mg/dL   GFR, Estimated 55 (L) >60 mL/min    Comment: (NOTE) Calculated using the CKD-EPI Creatinine Equation (2021)    Anion gap 10 5 - 15  Comment: Performed at [facility], [address], [place], [place] [address]  Glucose, capillary     Status: Abnormal   Collection Time: 04/03/23  7:10 AM  Result Value Ref Range   Glucose-Capillary 110 (H) 70 - 99 mg/dL    Comment: Glucose reference range applies only to samples taken after fasting for at least 8 hours.  Glucose, capillary     Status: Abnormal   Collection Time: 04/03/23 10:49 AM  Result Value Ref Range   Glucose-Capillary 211 (H) 70 - 99 mg/dL    Comment: Glucose reference range applies only to samples taken after fasting for at least 8 hours.  Glucose, capillary     Status: Abnormal   Collection Time: 04/03/23  4:56 PM  Result Value Ref Range   Glucose-Capillary 272 (H) 70 - 99 mg/dL    Comment: Glucose reference range applies only to samples taken after fasting for at least 8 hours.  Glucose, capillary     Status: Abnormal   Collection Time: 04/03/23  8:35 PM  Result Value Ref Range   Glucose-Capillary 230 (H) 70 - 99 mg/dL    Comment:  Glucose reference range applies only to samples taken after fasting for at least 8 hours.   Comment 1 Notify RN    Comment 2 Document in Chart   Basic metabolic panel     Status: Abnormal   Collection Time: 04/04/23  4:50 AM  Result Value Ref Range   Sodium 141 135 - 145 mmol/L   Potassium 4.2 3.5 - 5.1 mmol/L   Chloride 96 (L) 98 - 111 mmol/L   CO2 36 (H) 22 - 32 mmol/L   Glucose, Bld 117 (H) 70 - 99 mg/dL    Comment: Glucose reference range applies only to samples taken after fasting for at least 8 hours.   BUN 34 (H) 6 - 20 mg/dL   Creatinine, Ser 4.09 (H) 0.44 - 1.00 mg/dL   Calcium 9.1 8.9 - 81.1 mg/dL   GFR, Estimated 55 (L) >60 mL/min    Comment: (NOTE) Calculated using the CKD-EPI Creatinine Equation (2021)    Anion gap 9 5 - 15    Comment: Performed at [facility], [address] [place], [place] [address]  Glucose, capillary     Status: Abnormal   Collection Time: 04/04/23  7:31 AM  Result Value Ref Range   Glucose-Capillary 136 (H) 70 - 99 mg/dL    Comment: Glucose reference range applies only to samples taken after fasting for at least 8 hours.  Glucose, capillary     Status: Abnormal   Collection Time: 04/04/23 11:46 AM  Result Value Ref Range   Glucose-Capillary 334 (H) 70 - 99 mg/dL    Comment: Glucose reference range applies only to samples taken after fasting for at least 8 hours.    Medications:  Current Facility-Administered Medications  Medication Dose Route Frequency Provider Last Rate Last Admin   acetaminophen (TYLENOL) tablet 650 mg  650 mg Oral Q6H PRN Adefeso, Oladapo, DO   650 mg at 04/03/23 0827   Or   acetaminophen (TYLENOL) suppository 650 mg  650 mg Rectal Q6H PRN Adefeso, Oladapo, DO       aspirin EC tablet 81 mg  81 mg Oral Daily Adefeso, Oladapo, DO   81 mg at 04/04/23 0811   Chlorhexidine Gluconate Cloth 2 % PADS 6 each  6 each Topical Q0600 Adefeso, Oladapo, DO   6 each at 04/04/23 0642   dextromethorphan-guaiFENesin (MUCINEX DM) 30-600  MG  per 12 hr tablet 1 tablet  1 tablet Oral BID Adefeso, Oladapo, DO   1 tablet at 04/04/23 0811   enoxaparin (LOVENOX) injection 70 mg  70 mg Subcutaneous Q24H Tyrone Nine, MD   70 mg at 04/04/23 0815   insulin aspart (novoLOG) injection 0-20 Units  0-20 Units Subcutaneous TID WC Adefeso, Oladapo, DO   15 Units at 04/04/23 1219   insulin aspart (novoLOG) injection 0-5 Units  0-5 Units Subcutaneous QHS Adefeso, Oladapo, DO   2 Units at 04/03/23 2215   insulin aspart (novoLOG) injection 12 Units  12 Units Subcutaneous TID WC Tat, Onalee Hua, MD       insulin glargine-yfgn [facility]) injection 30 Units  30 Units Subcutaneous BID Tyrone Nine, MD   30 Units at 04/04/23 1219   ipratropium-albuterol (DUONEB) 0.5-2.5 (3) MG/3ML nebulizer solution 3 mL  3 mL Nebulization Q4H PRN Adefeso, Oladapo, DO       isosorbide mononitrate (IMDUR) 24 hr tablet 30 mg  30 mg Oral Daily Adefeso, Oladapo, DO   30 mg at 04/04/23 5409   methylPREDNISolone sodium succinate (SOLU-MEDROL) 40 mg/mL injection 40 mg  40 mg Intravenous Q24H Tyrone Nine, MD   40 mg at 04/04/23 0816   mupirocin ointment (BACTROBAN) 2 % 1 Application  1 Application Nasal BID Adefeso, Oladapo, DO   1 Application at 04/04/23 0814   OLANZapine (ZYPREXA) tablet 5 mg  5 mg Oral QHS Tyrone Nine, MD   5 mg at 04/03/23 2214   ondansetron (ZOFRAN) tablet 4 mg  4 mg Oral Q6H PRN Adefeso, Oladapo, DO       Or   ondansetron (ZOFRAN) injection 4 mg  4 mg Intravenous Q6H PRN Adefeso, Oladapo, DO       pantoprazole (PROTONIX) EC tablet 40 mg  40 mg Oral Daily Adefeso, Oladapo, DO   40 mg at 04/04/23 0811   propranolol (INDERAL) tablet 60 mg  60 mg Oral Daily Adefeso, Oladapo, DO   60 mg at 04/04/23 0810   rosuvastatin (CRESTOR) tablet 20 mg  20 mg Oral Daily Adefeso, Oladapo, DO   20 mg at 04/04/23 8119    Musculoskeletal: Strength & Muscle Tone:  Unable to assess Gait & Station:  Unable to assess Patient leans: N/A     Psychiatric Specialty  Exam:  Presentation  General Appearance:  appropriate Eye Contact: fair Speech: Rapid and pressured Speech Volume: Increased  Handedness:right  Mood and Affect  Mood: anxious Affect: Labile   Thought Process  Thought Processes: Disorganized  Descriptions of Associations:tangential Orientation:Full (Time, Place and Person)  Thought Content:delusion History of Schizophrenia/Schizoaffective disorder:No  Duration of Psychotic Symptoms:approx one year Hallucinations:none reported Ideas of Reference:delusion Suicidal Thoughts:none reported Homicidal Thoughts: none reported  Sensorium  Memory: Immediate Poor  Judgment: Poor  Insight: Poor   Executive Functions  Concentration: Poor  Attention Span: fair Recall: fair Fund of Knowledge: good Language: Fair   Psychomotor Activity  Psychomotor Activity:normal  Assets  Assets: Resides with family  Sleep  Sleep: unable to assess   Physical Exam: Physical Exam Vitals and nursing note reviewed.  Constitutional:      Appearance: Normal appearance.  HENT:     Head: Normocephalic and atraumatic.     Nose: Nose normal.  Cardiovascular:     Rate and Rhythm: Normal rate.  Musculoskeletal:     Cervical back: Normal range of motion.  Neurological:     Mental Status: She is alert and oriented to person,  place, and time.  Psychiatric:        Mood and Affect: Mood is anxious. Affect is labile.        Speech: Speech is rapid and pressured.        Behavior: Behavior is cooperative.        Thought Content: Thought content is delusional.        Judgment: Judgment is inappropriate.   Review of Systems  Constitutional: Negative.   HENT: Negative.    Eyes: Negative.   Respiratory: Negative.    Cardiovascular: Negative.   Gastrointestinal: Negative.   Genitourinary: Negative.   Musculoskeletal: Negative.   Neurological: Negative.   Psychiatric/Behavioral:  The patient is nervous/anxious.    Blood  pressure (!) 153/94, pulse 90, temperature 99.6 F (37.6 C), temperature source Oral, resp. rate 18, height 5' 7.2" (1.707 m), weight 134.5 kg, SpO2 92 %. Body mass index is 46.17 kg/m.  Treatment Plan Summary: Daily contact with patient to assess and evaluate symptoms and progress in treatment Recommend consider repeat EKG to assess for potential QTc prolongation.  Recommend consider repeat chest x-ray. Attempt to prioritize and document sleep.  Medications: -Consider increase olanzapine to 10 mg nightly if no QTc prolongation or fluid overload   Disposition: Plan for reassessment by psychiatry on 04/05/2023.   No evidence of imminent risk to self or others at present.   Patient does not meet criteria for psychiatric inpatient admission. Supportive therapy provided about ongoing stressors. Discussed crisis plan, support from social network, calling 911, coming to the Emergency Department, and calling Suicide Hotline.  This service was provided via telemedicine using a 2-way, interactive audio and video technology.  Names of all persons participating in this telemedicine service and their role in this encounter. Name: [name] Role: Patient  Name: Trula Ore Role:  Patient's daughter  Name: Gretta Cool Role: Psychiatrist  Name: Dr Onalee Hua Tat Role: Attending provider    Lenard Lance, FNP 04/04/2023 5:19 PM

## 2023-04-04 NOTE — Inpatient Diabetes Management (Signed)
Inpatient Diabetes Program Recommendations  AACE/ADA: New Consensus Statement on Inpatient Glycemic Control   Target Ranges:  Prepandial:   less than 140 mg/dL      Peak postprandial:   less than 180 mg/dL (1-2 hours)      Critically ill patients:  140 - 180 mg/dL    Latest Reference Range & Units 04/03/23 07:10 04/03/23 10:49 04/03/23 16:56 04/03/23 20:35 04/04/23 07:31 04/04/23 11:46  Glucose-Capillary 70 - 99 mg/dL 604 (H) 540 (H) 981 (H) 230 (H) 136 (H) 334 (H)   Review of Glycemic Control  Diabetes history: DM2 Outpatient Diabetes medications:  Lantus 45 units BID, Novolog 3-20 units,  Metformin 500 mg BID Current orders for Inpatient glycemic control: Semglee 30 units BID, Novolog 0-20 units TID with meals, Novolog 0-5 units at bedtime, Novolog 8 units TID with meals; Solumedrol 40 mg Q24H  Inpatient Diabetes Program Recommendations:    Insulin: If steroids are continued as ordered, please consider changing Semglee to 35 units QAM, Semglee 30 units at bedtime, and increasing meal coverage to Novolog 12 units TID with meals.  Thanks, Orlando Penner, RN, MSN, CDCES Diabetes Coordinator Inpatient Diabetes Program [phone] (Team Pager from 8am to 5pm)

## 2023-04-05 DIAGNOSIS — N179 Acute kidney failure, unspecified: Secondary | ICD-10-CM | POA: Diagnosis not present

## 2023-04-05 DIAGNOSIS — E1165 Type 2 diabetes mellitus with hyperglycemia: Secondary | ICD-10-CM | POA: Diagnosis not present

## 2023-04-05 DIAGNOSIS — J9601 Acute respiratory failure with hypoxia: Secondary | ICD-10-CM | POA: Diagnosis not present

## 2023-04-05 LAB — CBC
HCT: 41.6 % (ref 36.0–46.0)
Hemoglobin: 12.5 g/dL (ref 12.0–15.0)
MCH: 27.8 pg (ref 26.0–34.0)
MCHC: 30 g/dL (ref 30.0–36.0)
MCV: 92.7 fL (ref 80.0–100.0)
Platelets: 267 10*3/uL (ref 150–400)
RBC: 4.49 MIL/uL (ref 3.87–5.11)
RDW: 14.4 % (ref 11.5–15.5)
WBC: 6.4 10*3/uL (ref 4.0–10.5)
nRBC: 0 % (ref 0.0–0.2)

## 2023-04-05 LAB — BASIC METABOLIC PANEL
Anion gap: 9 (ref 5–15)
BUN: 31 mg/dL — ABNORMAL HIGH (ref 6–20)
CO2: 35 mmol/L — ABNORMAL HIGH (ref 22–32)
Calcium: 9 mg/dL (ref 8.9–10.3)
Chloride: 93 mmol/L — ABNORMAL LOW (ref 98–111)
Creatinine, Ser: 1.26 mg/dL — ABNORMAL HIGH (ref 0.44–1.00)
GFR, Estimated: 49 mL/min — ABNORMAL LOW (ref >60)
Glucose, Bld: 219 mg/dL — ABNORMAL HIGH (ref 70–99)
Potassium: 4.4 mmol/L (ref 3.5–5.1)
Sodium: 137 mmol/L (ref 135–145)

## 2023-04-05 LAB — PROCALCITONIN: Procalcitonin: <0.1 ng/mL

## 2023-04-05 LAB — BRAIN NATRIURETIC PEPTIDE: B Natriuretic Peptide: 45 pg/mL (ref 0.0–100.0)

## 2023-04-05 LAB — GLUCOSE, CAPILLARY
Glucose-Capillary: 104 mg/dL — ABNORMAL HIGH (ref 70–99)
Glucose-Capillary: 231 mg/dL — ABNORMAL HIGH (ref 70–99)
Glucose-Capillary: 117 mg/dL — ABNORMAL HIGH (ref 70–99)

## 2023-04-05 MED ORDER — OXCARBAZEPINE 300 MG PO TABS: 300.0000 mg | ORAL_TABLET | Freq: Two times a day (BID) | ORAL | 1 refills | Status: DC

## 2023-04-05 MED ORDER — OXCARBAZEPINE 300 MG PO TABS: 300.0000 mg | ORAL_TABLET | Freq: Two times a day (BID) | ORAL | Status: DC

## 2023-04-05 MED ORDER — OLANZAPINE 10 MG PO TABS: 10.0000 mg | ORAL_TABLET | Freq: Every day | ORAL | 1 refills | Status: DC

## 2023-04-05 MED ORDER — FUROSEMIDE 10 MG/ML IJ SOLN
40.0000 mg | Freq: Once | INTRAMUSCULAR | Status: AC
  Administered 2023-04-05: 40 mg via INTRAVENOUS
  Filled 2023-04-05: qty 4

## 2023-04-05 NOTE — Consult Note (Signed)
Telepsych Consultation   Reason for Consult:  delusional thought content, history bipolar disorder Referring Physician:  Dr Arbutus Leas Location of Patient: [place] Location of Provider: Behavioral Health TTS Department  Patient Identification: AMAURI MEDELLIN MRN:  [id number] Principal Diagnosis: Acute respiratory failure with hypoxia [facility]) Diagnosis:  Principal Problem:   Acute respiratory failure with hypoxia (HCC) Active Problems:   Uncontrolled type 2 diabetes mellitus with hyperglycemia, with long-term current use of insulin (HCC)   Acute kidney injury superimposed on chronic kidney disease (HCC)   Essential hypertension   Morbid obesity with BMI of 50.0-59.9, adult (HCC)   Elevated brain natriuretic peptide (BNP) level   Elevated troponin   COPD (chronic obstructive pulmonary disease) (HCC)   Mixed hyperlipidemia   Total Time spent with patient: 30 minutes  Subjective:   CRUZITA LIPA is a 57 y.o. female patient who initially presented to [facility] emergency department on 04/01/2023.  Patient states "nobody comes to visit me here and I think my daughter just wants to put me somewhere. I don't want to be institutionalized for the rest of my life."  HPI: Patient is reassessed by this nurse practitioner virtually, via telepsychiatry monitor.  She is reclined on hospital stretcher, no apparent distress.  Patient visualized and not wearing nasal cannula today.  She reports "I am breathing so much better."  She is alert and oriented, pleasant and cooperative during assessment. She presents with anxious mood, congruent affect.  Speech intermittently tangential, rapid and pressured at times.  Patient's speech is difficult to understand at times, noticeably she is not wearing dentures currently.  Patient continues to present with likely fixed delusions surrounding a romantic interest called Poncho.  Patient reports she believes she could be pregnant and this would be cause for  her to discharge to the home of Poncho versus her current home with her husband.  Spoke with patient's daughter on yesterday who confirms ongoing delusional thinking related to "Poncho" for approximately 1 year.  Nicolena continues to deny suicidal and homicidal ideations.  No reported history of suicide attempts, no history of nonsuicidal self-harm behaviors.  She denies auditory and visual hallucinations.  There is no evidence of delusional thought content no indication that patient is responding to internal stimuli.  Patient resides in [place] with spouse.  She denies access to weapons.  She receives disability income.  She denies alcohol and substance use.  Patient is willing to follow-up with outpatient psychiatry.  She has been followed psychiatric need at primary care provider's office.  Marylu Lund and attending RN, at bedside, report sleep improved last night. Patient olanzapine increased from 5mg  at bedtime to 10mg  at bedtime yesterday.  Patient offered support and encouragement.  Discussed medication changes including restarting prior to admission medication, oxcarbazepine 300 mg twice daily.  Past Psychiatric History: Bipolar 1 disorder, adjustment disorder with mixed disturbance of emotions and conduct, acute psychosis  Risk to Self:  Denies Risk to Others:  Denies Prior Inpatient Therapy:  Recent admission at [facility] approximately 1 month ago Prior Outpatient Therapy:  Denies  Past Medical History:  Past Medical History:  Diagnosis Date   Agitation 07/28/2022   Anxiety    COPD (chronic obstructive pulmonary disease) (HCC)    Diabetes mellitus    Fear of    falling   Hemophilia (HCC)    Hypertension    Panic attacks    Parkinson's disease    Seizures (HCC)     Past Surgical History:  Procedure Laterality Date   ABDOMINAL HYSTERECTOMY     OOPHORECTOMY     SHOULDER CLOSED REDUCTION Right 04/22/2021   Procedure: CLOSED REDUCTION SHOULDER;  Surgeon:  Vickki Hearing, MD;  Location: [facility];  Service: Orthopedics;  Laterality: Right;   SHOULDER CLOSED REDUCTION Right 04/25/2021   Procedure: CLOSED REDUCTION SHOULDER;  Surgeon: Vickki Hearing, MD;  Location: [facility];  Service: Orthopedics;  Laterality: Right;   Family History: History reviewed. No pertinent family history. Family Psychiatric  History: None reported Social History:  Social History   Substance and Sexual Activity  Alcohol Use No     Social History   Substance and Sexual Activity  Drug Use No    Social History   Socioeconomic History   Marital status: Married    Spouse name: Not on file   Number of children: Not on file   Years of education: Not on file   Highest education level: Not on file  Occupational History   Not on file  Tobacco Use   Smoking status: Never   Smokeless tobacco: Never  Substance and Sexual Activity   Alcohol use: No   Drug use: No   Sexual activity: Yes    Birth control/protection: Surgical  Other Topics Concern   Not on file  Social History Narrative   Not on file   Social Determinants of Health   Financial Resource Strain: Not on file  Food Insecurity: Patient Declined (04/01/2023)   Hunger Vital Sign    Worried About Running Out of Food in the Last Year: Patient declined    Ran Out of Food in the Last Year: Patient declined  Transportation Needs: Patient Unable To Answer (04/01/2023)   PRAPARE - Administrator, Civil Service (Medical): Patient unable to answer    Lack of Transportation (Non-Medical): Patient unable to answer  Physical Activity: Not on file  Stress: Not on file  Social Connections: Not on file   Additional Social History:    Allergies:   Allergies  Allergen Reactions   Bee Venom Hives    Labs:  Results for orders placed or performed during the hospital encounter of 03/30/23 (from the past 48 hour(s))  Glucose, capillary     Status: Abnormal   Collection Time: 04/03/23  4:56 PM   Result Value Ref Range   Glucose-Capillary 272 (H) 70 - 99 mg/dL    Comment: Glucose reference range applies only to samples taken after fasting for at least 8 hours.  Glucose, capillary     Status: Abnormal   Collection Time: 04/03/23  8:35 PM  Result Value Ref Range   Glucose-Capillary 230 (H) 70 - 99 mg/dL    Comment: Glucose reference range applies only to samples taken after fasting for at least 8 hours.   Comment 1 Notify RN    Comment 2 Document in Chart   Basic metabolic panel     Status: Abnormal   Collection Time: 04/04/23  4:50 AM  Result Value Ref Range   Sodium 141 135 - 145 mmol/L   Potassium 4.2 3.5 - 5.1 mmol/L   Chloride 96 (L) 98 - 111 mmol/L   CO2 36 (H) 22 - 32 mmol/L   Glucose, Bld 117 (H) 70 - 99 mg/dL    Comment: Glucose reference range applies only to samples taken after fasting for at least 8 hours.   BUN 34 (H) 6 - 20 mg/dL   Creatinine, Ser 4.09 (H) 0.44 - 1.00 mg/dL  Calcium 9.1 8.9 - 10.3 mg/dL   GFR, Estimated 55 (L) >60 mL/min    Comment: (NOTE) Calculated using the CKD-EPI Creatinine Equation (2021)    Anion gap 9 5 - 15    Comment: Performed at [facility], [address] [place], [place] [address]  Glucose, capillary     Status: Abnormal   Collection Time: 04/04/23  7:31 AM  Result Value Ref Range   Glucose-Capillary 136 (H) 70 - 99 mg/dL    Comment: Glucose reference range applies only to samples taken after fasting for at least 8 hours.  Glucose, capillary     Status: Abnormal   Collection Time: 04/04/23 11:46 AM  Result Value Ref Range   Glucose-Capillary 334 (H) 70 - 99 mg/dL    Comment: Glucose reference range applies only to samples taken after fasting for at least 8 hours.  Glucose, capillary     Status: Abnormal   Collection Time: 04/04/23  5:29 PM  Result Value Ref Range   Glucose-Capillary 322 (H) 70 - 99 mg/dL    Comment: Glucose reference range applies only to samples taken after fasting for at least 8 hours.  Glucose,  capillary     Status: Abnormal   Collection Time: 04/04/23  9:09 PM  Result Value Ref Range   Glucose-Capillary 249 (H) 70 - 99 mg/dL    Comment: Glucose reference range applies only to samples taken after fasting for at least 8 hours.   Comment 1 Notify RN    Comment 2 Document in Chart   Glucose, capillary     Status: Abnormal   Collection Time: 04/05/23  7:44 AM  Result Value Ref Range   Glucose-Capillary 104 (H) 70 - 99 mg/dL    Comment: Glucose reference range applies only to samples taken after fasting for at least 8 hours.  Procalcitonin     Status: None   Collection Time: 04/05/23 10:42 AM  Result Value Ref Range   Procalcitonin <0.10 ng/mL    Comment:        Interpretation: PCT (Procalcitonin) <= 0.5 ng/mL: Systemic infection (sepsis) is not likely. Local bacterial infection is possible. (NOTE)       Sepsis PCT Algorithm           Lower Respiratory Tract                                      Infection PCT Algorithm    ----------------------------     ----------------------------         PCT < 0.25 ng/mL                PCT < 0.10 ng/mL          Strongly encourage             Strongly discourage   discontinuation of antibiotics    initiation of antibiotics    ----------------------------     -----------------------------       PCT 0.25 - 0.50 ng/mL            PCT 0.10 - 0.25 ng/mL               OR       >80% decrease in PCT            Discourage initiation of  antibiotics      Encourage discontinuation           of antibiotics    ----------------------------     -----------------------------         PCT >= 0.50 ng/mL              PCT 0.26 - 0.50 ng/mL               AND        <80% decrease in PCT             Encourage initiation of                                             antibiotics       Encourage continuation           of antibiotics    ----------------------------     -----------------------------        PCT >= 0.50  ng/mL                  PCT > 0.50 ng/mL               AND         increase in PCT                  Strongly encourage                                      initiation of antibiotics    Strongly encourage escalation           of antibiotics                                     -----------------------------                                           PCT <= 0.25 ng/mL                                                 OR                                        > 80% decrease in PCT                                      Discontinue / Do not initiate                                             antibiotics  Performed at [facility], [address] [place], [place] [address]   Brain natriuretic peptide     Status: None   Collection Time:  04/05/23 10:42 AM  Result Value Ref Range   B Natriuretic Peptide 45.0 0.0 - 100.0 pg/mL    Comment: Performed at [facility], [address], [place], [place] [address]  Basic metabolic panel     Status: Abnormal   Collection Time: 04/05/23 10:42 AM  Result Value Ref Range   Sodium 137 135 - 145 mmol/L   Potassium 4.4 3.5 - 5.1 mmol/L   Chloride 93 (L) 98 - 111 mmol/L   CO2 35 (H) 22 - 32 mmol/L   Glucose, Bld 219 (H) 70 - 99 mg/dL    Comment: Glucose reference range applies only to samples taken after fasting for at least 8 hours.   BUN 31 (H) 6 - 20 mg/dL   Creatinine, Ser 4.69 (H) 0.44 - 1.00 mg/dL   Calcium 9.0 8.9 - 62.9 mg/dL   GFR, Estimated 49 (L) >60 mL/min    Comment: (NOTE) Calculated using the CKD-EPI Creatinine Equation (2021)    Anion gap 9 5 - 15    Comment: Performed at [facility], [address], [place], [place] [address]  CBC     Status: None   Collection Time: 04/05/23 10:42 AM  Result Value Ref Range   WBC 6.4 4.0 - 10.5 K/uL   RBC 4.49 3.87 - 5.11 MIL/uL   Hemoglobin 12.5 12.0 - 15.0 g/dL   HCT 32.4 40.1 - 02.7 %   MCV 92.7 80.0 - 100.0 fL   MCH 27.8 26.0 - 34.0 pg   MCHC 30.0 30.0 - 36.0 g/dL   RDW 25.3 66.4 - 40.3 %    Platelets 267 150 - 400 K/uL   nRBC 0.0 0.0 - 0.2 %    Comment: Performed at [facility], [address] [place], [place] [address]  Glucose, capillary     Status: Abnormal   Collection Time: 04/05/23 11:30 AM  Result Value Ref Range   Glucose-Capillary 231 (H) 70 - 99 mg/dL    Comment: Glucose reference range applies only to samples taken after fasting for at least 8 hours.    Medications:  Current Facility-Administered Medications  Medication Dose Route Frequency Provider Last Rate Last Admin   acetaminophen (TYLENOL) tablet 650 mg  650 mg Oral Q6H PRN Adefeso, Oladapo, DO   650 mg at 04/03/23 0827   Or   acetaminophen (TYLENOL) suppository 650 mg  650 mg Rectal Q6H PRN Adefeso, Oladapo, DO       aspirin EC tablet 81 mg  81 mg Oral Daily Adefeso, Oladapo, DO   81 mg at 04/05/23 0901   Chlorhexidine Gluconate Cloth 2 % PADS 6 each  6 each Topical Q0600 Adefeso, Oladapo, DO   6 each at 04/05/23 0525   enoxaparin (LOVENOX) injection 70 mg  70 mg Subcutaneous Q24H Hazeline Junker B, MD   70 mg at 04/05/23 0903   insulin aspart (novoLOG) injection 0-20 Units  0-20 Units Subcutaneous TID WC Adefeso, Oladapo, DO   7 Units at 04/05/23 1224   insulin aspart (novoLOG) injection 0-5 Units  0-5 Units Subcutaneous QHS Adefeso, Oladapo, DO   2 Units at 04/04/23 2124   insulin aspart (novoLOG) injection 12 Units  12 Units Subcutaneous TID WC Catarina Hartshorn, MD   12 Units at 04/05/23 1224   insulin glargine-yfgn (SEMGLEE) injection 30 Units  30 Units Subcutaneous BID Tyrone Nine, MD   30 Units at 04/05/23 1223   ipratropium-albuterol (DUONEB) 0.5-2.5 (3) MG/3ML nebulizer solution 3 mL  3 mL Nebulization Q4H PRN  Frankey Shown, DO       isosorbide mononitrate (IMDUR) 24 hr tablet 30 mg  30 mg Oral Daily Adefeso, Oladapo, DO   30 mg at 04/05/23 0901   mupirocin ointment (BACTROBAN) 2 % 1 Application  1 Application Nasal BID Adefeso, Oladapo, DO   1 Application at 04/05/23 0902   OLANZapine (ZYPREXA) tablet 10  mg  10 mg Oral Maura Crandall, MD   10 mg at 04/04/23 2123   ondansetron (ZOFRAN) tablet 4 mg  4 mg Oral Q6H PRN Adefeso, Oladapo, DO       Or   ondansetron (ZOFRAN) injection 4 mg  4 mg Intravenous Q6H PRN Adefeso, Oladapo, DO       pantoprazole (PROTONIX) EC tablet 40 mg  40 mg Oral Daily Adefeso, Oladapo, DO   40 mg at 04/05/23 0901   propranolol (INDERAL) tablet 60 mg  60 mg Oral Daily Adefeso, Oladapo, DO   60 mg at 04/05/23 0901   rosuvastatin (CRESTOR) tablet 20 mg  20 mg Oral Daily Adefeso, Oladapo, DO   20 mg at 04/05/23 0901    Musculoskeletal: Strength & Muscle Tone:  unable to assess Gait & Station:  unable to assess Patient leans: N/A          Psychiatric Specialty Exam:  Presentation  General Appearance:  Appropriately groomed Eye Contact: fair Speech: Slurred- currently not wearing dentures Speech Volume: normal Handedness:right  Mood and Affect  Mood: anxious Affect: congruent  Thought Process  Thought Processes: tangential Descriptions of Associations:delusion Orientation:Full (Time, Place and Person)  Thought Content:Illogical; Paranoid Ideation  History of Schizophrenia/Schizoaffective disorder:No  Duration of Psychotic Symptoms: per daughter one year  Psychiatric Specialty Exam: Physical Exam Vitals and nursing note reviewed.  HENT:     Head: Normocephalic and atraumatic.  Cardiovascular:     Rate and Rhythm: Normal rate.  Pulmonary:     Effort: Pulmonary effort is normal.  Neurological:     Mental Status: She is alert.  Psychiatric:        Attention and Perception: Attention normal.        Mood and Affect: Mood is anxious.        Speech: Speech is rapid and pressured and tangential.        Behavior: Behavior is cooperative.        Thought Content: Thought content is delusional.        Cognition and Memory: Cognition normal.     Review of Systems  Constitutional: Negative.   HENT: Negative.    Eyes: Negative.    Respiratory: Negative.    Cardiovascular: Negative.   Gastrointestinal: Negative.   Genitourinary: Negative.   Musculoskeletal: Negative.   Neurological: Negative.   Psychiatric/Behavioral:  The patient is nervous/anxious.     Blood pressure (!) 149/90, pulse 77, temperature 98.1 F (36.7 C), temperature source Oral, resp. rate 16, height 5' 7.2" (1.707 m), weight 133.9 kg, SpO2 92%.Body mass index is 45.96 kg/m.   Hallucinations:denies Ideas of Reference: delusions Suicidal Thoughts:none Homicidal Thoughts:none  Sensorium  Memory: fair Judgment: fair Insight: present  Executive Functions  Concentration: fair Attention Span: fair Recall: fair Fund of Knowledge: good Language: Fair   Psychomotor Activity  Psychomotor Activity:normal  Assets  Assets: Resides with family, housing, insurance  Sleep  Sleep:fair   Physical Exam: Physical Exam Vitals and nursing note reviewed.  HENT:     Head: Normocephalic and atraumatic.  Cardiovascular:     Rate and Rhythm: Normal rate.  Pulmonary:  Effort: Pulmonary effort is normal.  Neurological:     Mental Status: She is alert.  Psychiatric:        Attention and Perception: Attention normal.        Mood and Affect: Mood is anxious.        Speech: Speech is rapid and pressured and tangential.        Behavior: Behavior is cooperative.        Thought Content: Thought content is delusional.        Cognition and Memory: Cognition normal.    Review of Systems  Constitutional: Negative.   HENT: Negative.    Eyes: Negative.   Respiratory: Negative.    Cardiovascular: Negative.   Gastrointestinal: Negative.   Genitourinary: Negative.   Musculoskeletal: Negative.   Neurological: Negative.   Psychiatric/Behavioral:  The patient is nervous/anxious.    Blood pressure (!) 149/90, pulse 77, temperature 98.1 F (36.7 C), temperature source Oral, resp. rate 16, height 5' 7.2" (1.707 m), weight 133.9 kg, SpO2 92%.  Body mass index is 45.96 kg/m.  Treatment Plan Summary: Medication management and Plan to clear by psychiatry Recommend consider initiate prior to admission medication: -Oxcarbazepine 300 mg twice daily/mood  Continue current medication:  -Olanzapine 10 mg nightly  Disposition: Patient cleared by psychiatry.  Follow-up with outpatient psychiatry for medication management and individual counseling.   No evidence of imminent risk to self or others at present.   Patient does not meet criteria for psychiatric inpatient admission. Supportive therapy provided about ongoing stressors. Discussed crisis plan, support from social network, calling 911, coming to the Emergency Department, and calling Suicide Hotline.  This service was provided via telemedicine using a 2-way, interactive audio and video technology.  Names of all persons participating in this telemedicine service and their role in this encounter. Name: Ulanda Edison Role: Patient  Name: Doran Heater Role: Nurse Practitioner  Name: Dr Gretta Cool Role: Psychiatrist  Name: Dr Arbutus Leas Role: Attending provider    Lenard Lance, FNP 04/05/2023 2:00 PM

## 2023-04-05 NOTE — Inpatient Diabetes Management (Signed)
Inpatient Diabetes Program Recommendations  AACE/ADA: New Consensus Statement on Inpatient Glycemic Control   Target Ranges:  Prepandial:   less than 140 mg/dL      Peak postprandial:   less than 180 mg/dL (1-2 hours)      Critically ill patients:  140 - 180 mg/dL    Latest Reference Range & Units 04/04/23 07:31 04/04/23 11:46 04/04/23 17:29 04/04/23 21:09 04/05/23 07:44  Glucose-Capillary 70 - 99 mg/dL 409 (H) 811 (H) 914 (H) 249 (H) 104 (H)    Latest Reference Range & Units 04/03/23 07:10 04/03/23 10:49 04/03/23 16:56 04/03/23 20:35  Glucose-Capillary 70 - 99 mg/dL 782 (H) 956 (H) 213 (H) 230 (H)   Review of Glycemic Control  Diabetes history: DM2 Outpatient Diabetes medications:  Lantus 45 units BID, Novolog 3-20 units,  Metformin 500 mg BID Current orders for Inpatient glycemic control: Semglee 30 units BID, Novolog 0-20 units TID with meals, Novolog 0-5 units at bedtime, Novolog 12 units TID with meals  Inpatient Diabetes Program Recommendations:     Insulin: Noted steroids discontinued and meal coverage increased yesterday.  If CBGs are consistently over 180 mg/dl throughout the day, may want to consider changing Semglee to 35 units QAM, Semglee 30 units at bedtime.  Thanks, Orlando Penner, RN, MSN, CDCES Diabetes Coordinator Inpatient Diabetes Program [phone] (Team Pager from 8am to 5pm)

## 2023-04-05 NOTE — TOC Transition Note (Signed)
Transition of Care [facility]) - CM/SW Discharge Note   Patient Details  Name: [name] MRN: [id number] Date of Birth: Jun 05, 1966  Transition of Care [facility]) CM/SW Contact:  Karn Cassis, LCSW Phone Number: 04/05/2023, 3:03 PM   Clinical Narrative:  Pt psych cleared. LCSW spoke with pt's daughter, Wynona Canes regarding d/c plan. Wynona Canes reports pt lives with her husband who is with pt around the clock. Discussed PT recommendations for home health. Wynona Canes is agreeable with no preference on agency. Referred and accepted by Morrie Sheldon with [facility]. HHPT order in. Wynona Canes reports pt is followed by [facility]. However, she has taken her to appointments with behavior health specialist there and pt refused to talk. DeeDee at [facility] confirms that pt won't keep appointments. LCSW called Frederich Chick to make ACT team referral. Awaiting referral packet to be sent to complete. Daughter agreeable. Pt can continue follow up at [facility]. Wynona Canes is aware pt will be d/c today and plans to pick up pt this afternoon.      Final next level of care: Home w Home Health Services Barriers to Discharge: Barriers Resolved   Patient Goals and CMS Choice   Choice offered to / list presented to : Patient  Discharge Placement                    Name of family member notified: daughter Patient and family notified of of transfer: 04/05/23  Discharge Plan and Services Additional resources added to the After Visit Summary for   In-house Referral: Clinical Social Work                        HH Arranged: PT HH Agency: [facility] Date HH Agency Contacted: 04/05/23 Time HH Agency Contacted: 1503 Representative spoke with at [facility] Agency: Morrie Sheldon  Social Determinants of Health (SDOH) Interventions SDOH Screenings   Food Insecurity: Patient Declined (04/01/2023)  Housing: High Risk (04/01/2023)  Transportation Needs:  Patient Unable To Answer (04/01/2023)  Utilities: Patient Unable To Answer (04/01/2023)  Tobacco Use: Low Risk  (03/30/2023)     Readmission Risk Interventions    04/02/2023    1:43 PM 04/25/2021    1:27 PM  Readmission Risk Prevention Plan  Transportation Screening Complete Complete  Home Care Screening  Complete  Medication Review (RN CM)  Complete  HRI or Home Care Consult Complete   Social Work Consult for Recovery Care Planning/Counseling Complete   Palliative Care Screening Not Applicable   Medication Review [facility]) Complete

## 2023-04-05 NOTE — Discharge Summary (Signed)
Physician Discharge Summary   Patient: [name] MRN: [id number] DOB: [birth year]  Admit date:     03/30/2023  Discharge date: 04/05/23  Discharge Physician: Onalee Hua Adriana Lina   PCP: [facility]   Recommendations at discharge:   Please follow up with primary care provider within 1-2 weeks  Please repeat BMP and CBC in one week     Hospital Course: 57 year old female with a history of cognitive impairment, bipolar disorder, seizure disorder, hypertension, COPD, OSA/OHS, chronic respiratory failure on 2-3 L, diabetes mellitus type 2, CKD stage III, and right shoulder dislocation presenting with shortness of breath.   She found to be hypoxic by EMS. She remained hypoxemic in the ED though CXR was clear. BNP was elevated. Lasix, steroids, and duonebs given and the patient admitted to the floor. She became confused prompting an ABG which showed respiratory acidosis for which BiPAP was started and she was moved to the SDU. With diuresis she has improved, and her respiratory status is stable. SHE IS MEDICALLY CLEARED FOR DISPOSITION. Awaiting psychiatry recommendations regarding disposition. Patient's olanzapine was titrated up to 10 mg q hs.  Pt seemed more calm.  TTS ultimately cleared patient for discharge with olanzapine and oxcarbazine 300 mg bid.  Assessment and Plan: Acute on chronic respiratory failure with hypoxia and hypercarbia -03/31/2023 ABG 7.31/61/73/30 -Multifactorial including OSA/OHS, fluid overload, atelectasis -She has never smoked. -Chronically on 2-3 L at home. -Initially placed on BiPAP -7/7 CTA chest negative PE, subsegmental atelectasis in the lung bases.  No pulmonary edema or effusion.  GGO in the right apex. -03/31/2023 echo EF 60 to 65%, no WMA, grade 1 DD, trivial TR -Minimize opioids and hypnotic medications -wean oxygen for saturation 88-92% -improved with IV lasix x 4 -d/c solumedrol -oxygen weaned back to baseline   Acute on chronic renal  failure--CKD 3 a -Baseline creatinine 1.1-1.4 -Serum creatinine peaked 2.19 -Improved with diuresis suggesting venous congestion -hold ACEi   Bipolar disorder -Minimize hypnotic medications - Admitted to inpatient psych facility 02/2023 for acute psychosis, and has abnormal presentation at this time. Presented with acute psychosis 02/26/2023.  - Would appreciate psychiatry evaluation, TTS consulted. Note 7/8 stated "Disposition: Royston Bake NP, recommends pt to be psychiatric cleared once medical cleared.  Disposition discussed with Chief Operating Officer.  RN to discuss with EDP." I have discussed with them at length, still awaiting clarification of recommendations. Her flight of ideas, delusional and disorganized thinking suggest she would not be safe to return home. She also has no phone and knows no way of contacting her family/friends.  - No home medications on list. Started zyprexa qHS which improved symptoms (previously on abilify and cymbalta) -04/05/23--cleared by TTS for discharge home with daughter with oxcarbazepine 300 mg bid and olanzapine 10 mg hs. -TOC helping arrange for ACT team to follow up with patient as well as follow up at [facility] whom has a "behavioral therapy specialist"   Uncontrolled diabetes mellitus type 2 with hyperglycemia -02/26/23 A1c--10.3 -increase semglee to 35 units -NovoLog sliding scale -novolog 12 units with meals   Hyperkalemia:  -Mild, resolved. - Holding ACE inhibitor, keep cardiac monitoring.    Morbid Obesity -BMI 48.33 -lifestyle modification   Essential Hypertension -restart inderal and imdur   Hyperlipidemia -restart statin        Consultants: TTS Procedures performed: none  Disposition: Home Diet recommendation:  Cardiac diet DISCHARGE MEDICATION: Allergies as of 04/05/2023       Reactions   Bee  Venom Hives        Medication List     TAKE these medications    aspirin EC 81 MG tablet Take 81 mg by mouth every  morning.   insulin aspart 100 UNIT/ML injection Commonly known as: novoLOG CBG < 70: implement hypoglycemia protocol CBG 70 - 120: 0 units CBG 121 - 150: 3 units CBG 151 - 200: 4 units CBG 201 - 250: 7 units CBG 251 - 300: 11 units CBG 301 - 350: 15 units CBG 351 - 400: 20 units What changed:  how much to take how to take this when to take this additional instructions   isosorbide mononitrate 30 MG 24 hr tablet Commonly known as: IMDUR Take 1 tablet (30 mg total) by mouth daily.   Lantus SoloStar 100 UNIT/ML Solostar Pen Generic drug: insulin glargine Inject 45 Units into the skin 2 (two) times daily.   lisinopril 2.5 MG tablet Commonly known as: ZESTRIL Take 2.5 mg by mouth daily.   loratadine 10 MG tablet Commonly known as: CLARITIN Take 10 mg by mouth daily.   metFORMIN 500 MG tablet Commonly known as: GLUCOPHAGE Take 1 tablet (500 mg total) by mouth 2 (two) times daily with a meal.   Myrbetriq 25 MG Tb24 tablet Generic drug: mirabegron ER Take 25 mg by mouth daily.   OLANZapine 10 MG tablet Commonly known as: ZYPREXA Take 1 tablet (10 mg total) by mouth at bedtime.   Oxcarbazepine 300 MG tablet Commonly known as: TRILEPTAL Take 1 tablet (300 mg total) by mouth 2 (two) times daily.   propranolol 60 MG tablet Commonly known as: INDERAL Take 1 tablet (60 mg total) by mouth daily. On an empty stomach   rosuvastatin 20 MG tablet Commonly known as: CRESTOR Take 20 mg by mouth daily.        Follow-up Information     [facility].   Specialty: Home Health Services Why: Will contact you to schedule home health visits.               Discharge Exam: Filed Weights   04/03/23 0109 04/04/23 0500 04/05/23 0500  Weight: (!) 140.8 kg 134.5 kg 133.9 kg   HEENT:  [place]/AT, No thrush, no icterus CV:  RRR, no rub, no S3, no S4 Lung:  CTA, no wheeze, no rhonchi Abd:  soft/+BS, NT Ext:  No edema, no lymphangitis, no synovitis,  no rash   Condition at discharge: stable  The results of significant diagnostics from this hospitalization (including imaging, microbiology, ancillary and laboratory) are listed below for reference.   Imaging Studies: DG CHEST PORT 1 VIEW  Result Date: 04/04/2023 CLINICAL DATA:  Dyspnea EXAM: PORTABLE CHEST 1 VIEW COMPARISON:  04/01/2023 and 03/30/2023 FINDINGS: Atherosclerotic calcification of the aortic arch. The lungs appear clear. Heart size within normal limits. No blunting of the costophrenic angles. Right glenohumeral arthropathy possible with free osteochondral fragments. Thoracic spondylosis. IMPRESSION: 1. No acute findings. 2. Right glenohumeral arthropathy with suspected free osteochondral fragments. 3. Thoracic spondylosis. 4.  Aortic Atherosclerosis (ICD10-I70.0). Electronically Signed   By: Gaylyn Rong M.D.   On: 04/04/2023 18:04   CT Angio Chest Pulmonary Embolism (PE) W or WO Contrast  Result Date: 04/01/2023 CLINICAL DATA:  Positive D-dimer. Clinical concern for pulmonary embolus. EXAM: CT ANGIOGRAPHY CHEST WITH CONTRAST TECHNIQUE: Multidetector CT imaging of the chest was performed using the standard protocol during bolus administration of intravenous contrast. Multiplanar CT image reconstructions and MIPs were obtained to evaluate  the vascular anatomy. RADIATION DOSE REDUCTION: This exam was performed according to the departmental dose-optimization program which includes automated exposure control, adjustment of the mA and/or kV according to patient size and/or use of iterative reconstruction technique. CONTRAST:  75mL OMNIPAQUE IOHEXOL 350 MG/ML SOLN COMPARISON:  04/27/2021 FINDINGS: Cardiovascular: The heart size is upper normal to borderline enlarged. No substantial pericardial effusion. Mild atherosclerotic calcification is noted in the wall of the thoracic aorta. There is no filling defect within the opacified pulmonary arteries to suggest the presence of an acute  pulmonary embolus. Mediastinum/Nodes: No mediastinal lymphadenopathy. 12 mm short axis right hilar lymph node is upper normal to mildly enlarged. No left hilar lymphadenopathy. The esophagus has normal imaging features. There is no axillary lymphadenopathy. Lungs/Pleura: Subsegmental atelectasis noted in the dependent lung bases. No focal airspace consolidation. No pulmonary edema or pleural effusion. No suspicious pulmonary nodule or mass. Patchy ground-glass attenuation in the right lung apex may be infectious/inflammatory. Upper Abdomen: Small cyst upper pole left kidney is similar to prior. No followup imaging is recommended. Musculoskeletal: No worrisome lytic or sclerotic osseous abnormality. Review of the MIP images confirms the above findings. IMPRESSION: 1. No CT evidence for acute pulmonary embolus. 2. Patchy ground-glass attenuation in the right lung apex may be infectious/inflammatory. 3. Borderline to mildly enlarged right hilar lymph node, likely reactive. Follow-up CT chest with contrast in 3 months recommended to ensure resolution. 4.  Aortic Atherosclerosis (ICD10-I70.0). Electronically Signed   By: Kennith Center M.D.   On: 04/01/2023 11:44   ECHOCARDIOGRAM COMPLETE  Result Date: 03/31/2023    ECHOCARDIOGRAM REPORT   Patient Name:   AMAAL DIMARTINO Date of Exam: 03/31/2023 Medical Rec #:  [id number]       Height:       66.0 in Accession #:    [id number]      Weight:       321.6 lb Date of Birth:  1966-07-15       BSA:          2.447 m Patient Age:    57 years        BP:           176/86 mmHg Patient Gender: F               HR:           85 bpm. [facility] Procedure: 2D Echo, Cardiac Doppler and Color Doppler Indications:    CHF-Acute Diastolic I50.31  History:        Patient has prior history of Echocardiogram examinations, most                 recent 04/23/2021. COPD; Risk Factors:Hypertension, Diabetes and                 Dyslipidemia. Acute respiratory failure with hypoxia (HCC),                  Elevated brain natriuretic peptide (BNP) and Troponin levels,                 Bipolar disorder, current episode manic severe with psychotic                 features (HCC).  Sonographer:    Celesta Gentile RCS Referring Phys: [id number] OLADAPO ADEFESO IMPRESSIONS  1. Left ventricular ejection fraction, by estimation, is 60 to 65%. The left ventricle has normal function. The left ventricle has no regional wall motion abnormalities. There is  mild left ventricular hypertrophy. Left ventricular diastolic parameters are consistent with Grade I diastolic dysfunction (impaired relaxation).  2. Right ventricular systolic function is normal. The right ventricular size is normal.  3. Right atrial size was mildly dilated.  4. The mitral valve is abnormal. No evidence of mitral valve regurgitation. No evidence of mitral stenosis. Moderate mitral annular calcification.  5. The aortic valve is tricuspid. There is mild calcification of the aortic valve. There is mild thickening of the aortic valve. Aortic valve regurgitation is not visualized. Aortic valve sclerosis is present, with no evidence of aortic valve stenosis.  6. The inferior vena cava is dilated in size with >50% respiratory variability, suggesting right atrial pressure of 8 mmHg. FINDINGS  Left Ventricle: Left ventricular ejection fraction, by estimation, is 60 to 65%. The left ventricle has normal function. The left ventricle has no regional wall motion abnormalities. The left ventricular internal cavity size was normal in size. There is  mild left ventricular hypertrophy. Left ventricular diastolic parameters are consistent with Grade I diastolic dysfunction (impaired relaxation). Right Ventricle: The right ventricular size is normal. No increase in right ventricular wall thickness. Right ventricular systolic function is normal. Left Atrium: Left atrial size was normal in size. Right Atrium: Right atrial size was mildly dilated. Pericardium: There is no  evidence of pericardial effusion. Mitral Valve: The mitral valve is abnormal. There is mild thickening of the mitral valve leaflet(s). Moderate mitral annular calcification. No evidence of mitral valve regurgitation. No evidence of mitral valve stenosis. MV peak gradient, 12.8 mmHg. The mean mitral valve gradient is 3.0 mmHg. Tricuspid Valve: The tricuspid valve is normal in structure. Tricuspid valve regurgitation is trivial. No evidence of tricuspid stenosis. Aortic Valve: The aortic valve is tricuspid. There is mild calcification of the aortic valve. There is mild thickening of the aortic valve. Aortic valve regurgitation is not visualized. Aortic valve sclerosis is present, with no evidence of aortic valve stenosis. Pulmonic Valve: The pulmonic valve was normal in structure. Pulmonic valve regurgitation is mild. No evidence of pulmonic stenosis. Aorta: The aortic root is normal in size and structure. Venous: The inferior vena cava is dilated in size with greater than 50% respiratory variability, suggesting right atrial pressure of 8 mmHg. IAS/Shunts: The interatrial septum appears to be lipomatous. No atrial level shunt detected by color flow Doppler.  LEFT VENTRICLE PLAX 2D LVIDd:         4.80 cm   Diastology LVIDs:         2.10 cm   LV e' medial:    7.18 cm/s LV PW:         1.20 cm   LV E/e' medial:  21.2 LV IVS:        1.30 cm   LV e' lateral:   11.90 cm/s LVOT diam:     1.90 cm   LV E/e' lateral: 12.8 LV SV:         116 LV SV Index:   47 LVOT Area:     2.84 cm  RIGHT VENTRICLE RV S prime:     18.00 cm/s TAPSE (M-mode): 2.7 cm LEFT ATRIUM           Index        RIGHT ATRIUM           Index LA diam:      3.70 cm 1.51 cm/m   RA Area:     23.00 cm LA Vol (A2C): 76.4 ml 31.22 ml/m  RA Volume:  81.40 ml  33.26 ml/m LA Vol (A4C): 64.4 ml 26.31 ml/m  AORTIC VALVE LVOT Vmax:   202.00 cm/s LVOT Vmean:  135.000 cm/s LVOT VTI:    0.410 m  AORTA Ao Root diam: 3.40 cm MITRAL VALVE MV Area (PHT): 3.72 cm     SHUNTS  MV Area VTI:   2.70 cm     Systemic VTI:  0.41 m MV Peak grad:  12.8 mmHg    Systemic Diam: 1.90 cm MV Mean grad:  3.0 mmHg MV Vmax:       1.79 m/s MV Vmean:      75.3 cm/s MV Decel Time: 204 msec MV E velocity: 152.00 cm/s MV A velocity: 181.00 cm/s MV E/A ratio:  0.84 Charlton Haws MD Electronically signed by Charlton Haws MD Signature Date/Time: 03/31/2023/1:14:09 PM    Final    DG Chest Port 1 View  Result Date: 03/30/2023 CLINICAL DATA:  Short of breath. EXAM: PORTABLE CHEST 1 VIEW COMPARISON:  07/08/2022 and older studies. FINDINGS: Cardiac silhouette is normal in size. No mediastinal or hilar masses. Lungs are clear. No convincing pleural effusion and no pneumothorax. Skeletal structures are grossly intact. IMPRESSION: No active disease. Electronically Signed   By: Amie Portland M.D.   On: 03/30/2023 18:46    Microbiology: Results for orders placed or performed during the hospital encounter of 03/30/23  MRSA Next Gen by PCR, Nasal     Status: Abnormal   Collection Time: 03/31/23  3:24 AM   Specimen: Nasal Mucosa; Nasal Swab  Result Value Ref Range Status   MRSA by PCR Next Gen DETECTED (A) NOT DETECTED Final    Comment: RESULT CALLED TO, READ BACK BY AND VERIFIED WITH: HYLTON L @ 0751 ON [id number] (NOTE) The GeneXpert MRSA Assay (FDA approved for NASAL specimens only), is one component of a comprehensive MRSA colonization surveillance program. It is not intended to diagnose MRSA infection nor to guide or monitor treatment for MRSA infections. Test performance is not FDA approved in patients less than 15 years old. Performed at [facility], [address], [place], [place] [address]     Labs: CBC: Recent Labs  Lab 03/30/23 1810 03/31/23 0345 04/05/23 1042  WBC 6.7 4.9 6.4  HGB 10.7* 11.2* 12.5  HCT 36.5 38.2 41.6  MCV 95.3 95.3 92.7  PLT 252 231 267   Basic Metabolic Panel: Recent Labs  Lab 03/31/23 0345 04/01/23 0354 04/02/23 0435 04/03/23 0401 04/04/23 0450  04/05/23 1042  NA 136 137 139 142 141 137  K 5.2* 4.8 4.2 4.0 4.2 4.4  CL 98 97* 97* 96* 96* 93*  CO2 26 30 35* 36* 36* 35*  GLUCOSE 335* 249* 132* 149* 117* 219*  BUN 59* 63* 54* 40* 34* 31*  CREATININE 1.91* 1.73* 1.50* 1.16* 1.16* 1.26*  CALCIUM 8.7* 8.7* 8.8* 9.0 9.1 9.0  MG 2.3  --   --   --   --   --   PHOS 5.4*  --   --   --   --   --    Liver Function Tests: Recent Labs  Lab 03/31/23 0345  AST 36  ALT 56*  ALKPHOS 132*  BILITOT 0.5  PROT 6.7  ALBUMIN 3.3*   CBG: Recent Labs  Lab 04/04/23 1146 04/04/23 1729 04/04/23 2109 04/05/23 0744 04/05/23 1130  GLUCAP 334* 322* 249* 104* 231*    Discharge time spent: greater than 30 minutes.  Signed: Catarina Hartshorn, MD [facility] 04/05/2023

## 2023-04-05 NOTE — Progress Notes (Signed)
Nsg Discharge Note  Admit Date:  03/30/2023 Discharge date: 04/05/2023   ALEISHA PAONE to be D/C'd Home per MD order.  AVS completed.   Patient/caregiver able to verbalize understanding.  Discharge Medication: Allergies as of 04/05/2023       Reactions   Bee Venom Hives        Medication List     TAKE these medications    aspirin EC 81 MG tablet Take 81 mg by mouth every morning.   insulin aspart 100 UNIT/ML injection Commonly known as: novoLOG CBG < 70: implement hypoglycemia protocol CBG 70 - 120: 0 units CBG 121 - 150: 3 units CBG 151 - 200: 4 units CBG 201 - 250: 7 units CBG 251 - 300: 11 units CBG 301 - 350: 15 units CBG 351 - 400: 20 units What changed:  how much to take how to take this when to take this additional instructions   isosorbide mononitrate 30 MG 24 hr tablet Commonly known as: IMDUR Take 1 tablet (30 mg total) by mouth daily.   Lantus SoloStar 100 UNIT/ML Solostar Pen Generic drug: insulin glargine Inject 45 Units into the skin 2 (two) times daily.   lisinopril 2.5 MG tablet Commonly known as: ZESTRIL Take 2.5 mg by mouth daily.   loratadine 10 MG tablet Commonly known as: CLARITIN Take 10 mg by mouth daily.   metFORMIN 500 MG tablet Commonly known as: GLUCOPHAGE Take 1 tablet (500 mg total) by mouth 2 (two) times daily with a meal.   Myrbetriq 25 MG Tb24 tablet Generic drug: mirabegron ER Take 25 mg by mouth daily.   OLANZapine 10 MG tablet Commonly known as: ZYPREXA Take 1 tablet (10 mg total) by mouth at bedtime.   Oxcarbazepine 300 MG tablet Commonly known as: TRILEPTAL Take 1 tablet (300 mg total) by mouth 2 (two) times daily.   propranolol 60 MG tablet Commonly known as: INDERAL Take 1 tablet (60 mg total) by mouth daily. On an empty stomach   rosuvastatin 20 MG tablet Commonly known as: CRESTOR Take 20 mg by mouth daily.        Discharge Assessment: Vitals:   04/04/23 2107 04/05/23 0300  BP: 123/82 (!)  149/90  Pulse: 88 77  Resp: 20 16  Temp: 99 F (37.2 C) 98.1 F (36.7 C)  SpO2: 92% 92%   Skin clean, dry and intact without evidence of skin break down, no evidence of skin tears noted. IV catheter discontinued intact. Site without signs and symptoms of complications - no redness or edema noted at insertion site, patient denies c/o pain - only slight tenderness at site.  Dressing with slight pressure applied.  D/c Instructions-Education: Discharge instructions given to patient/family with verbalized understanding. D/c education completed with patient/family including follow up instructions, medication list, d/c activities limitations if indicated, with other d/c instructions as indicated by MD - patient able to verbalize understanding, all questions fully answered. Patient instructed to return to ED, call 911, or call MD for any changes in condition.  Patient escorted via WC, and D/C home via private auto.  Laurena Spies, RN 04/05/2023 3:53 PM

## 2023-04-05 NOTE — Plan of Care (Signed)

## 2023-04-08 LAB — MRSA NEXT GEN BY PCR, NASAL: MRSA by PCR Next Gen: DETECTED — AB

## 2023-04-15 ENCOUNTER — Inpatient Hospital Stay (HOSPITAL_COMMUNITY)
Admission: EM | Admit: 2023-04-15 | Discharge: 2023-04-18 | DRG: 189 | Disposition: A | Discharge status: Home Health | Payer: Medicaid Other | Attending: Internal Medicine | Admitting: Internal Medicine

## 2023-04-15 ENCOUNTER — Emergency Department (HOSPITAL_COMMUNITY): Payer: Medicaid Other

## 2023-04-15 ENCOUNTER — Other Ambulatory Visit: Payer: Self-pay

## 2023-04-15 ENCOUNTER — Encounter (HOSPITAL_COMMUNITY): Payer: Self-pay

## 2023-04-15 DIAGNOSIS — I5033 Acute on chronic diastolic (congestive) heart failure: Secondary | ICD-10-CM | POA: Diagnosis present

## 2023-04-15 DIAGNOSIS — Z90710 Acquired absence of both cervix and uterus: Secondary | ICD-10-CM | POA: Diagnosis not present

## 2023-04-15 DIAGNOSIS — E1165 Type 2 diabetes mellitus with hyperglycemia: Secondary | ICD-10-CM | POA: Diagnosis present

## 2023-04-15 DIAGNOSIS — Z1152 Encounter for screening for COVID-19: Secondary | ICD-10-CM

## 2023-04-15 DIAGNOSIS — J9621 Acute and chronic respiratory failure with hypoxia: Secondary | ICD-10-CM | POA: Diagnosis present

## 2023-04-15 DIAGNOSIS — J9622 Acute and chronic respiratory failure with hypercapnia: Secondary | ICD-10-CM | POA: Diagnosis present

## 2023-04-15 DIAGNOSIS — Z794 Long term (current) use of insulin: Secondary | ICD-10-CM

## 2023-04-15 DIAGNOSIS — Z91030 Bee allergy status: Secondary | ICD-10-CM

## 2023-04-15 DIAGNOSIS — G20A1 Parkinson's disease without dyskinesia, without mention of fluctuations: Secondary | ICD-10-CM | POA: Diagnosis present

## 2023-04-15 DIAGNOSIS — G40909 Epilepsy, unspecified, not intractable, without status epilepticus: Secondary | ICD-10-CM | POA: Diagnosis present

## 2023-04-15 DIAGNOSIS — N1831 Chronic kidney disease, stage 3a: Secondary | ICD-10-CM | POA: Diagnosis present

## 2023-04-15 DIAGNOSIS — Z7984 Long term (current) use of oral hypoglycemic drugs: Secondary | ICD-10-CM

## 2023-04-15 DIAGNOSIS — I10 Essential (primary) hypertension: Secondary | ICD-10-CM | POA: Diagnosis present

## 2023-04-15 DIAGNOSIS — F319 Bipolar disorder, unspecified: Secondary | ICD-10-CM | POA: Diagnosis present

## 2023-04-15 DIAGNOSIS — Z91199 Patient's noncompliance with other medical treatment and regimen due to unspecified reason: Secondary | ICD-10-CM

## 2023-04-15 DIAGNOSIS — E1122 Type 2 diabetes mellitus with diabetic chronic kidney disease: Secondary | ICD-10-CM | POA: Diagnosis present

## 2023-04-15 DIAGNOSIS — E662 Morbid (severe) obesity with alveolar hypoventilation: Secondary | ICD-10-CM | POA: Diagnosis present

## 2023-04-15 DIAGNOSIS — E8729 Other acidosis: Secondary | ICD-10-CM | POA: Diagnosis present

## 2023-04-15 DIAGNOSIS — F312 Bipolar disorder, current episode manic severe with psychotic features: Secondary | ICD-10-CM | POA: Diagnosis present

## 2023-04-15 DIAGNOSIS — J441 Chronic obstructive pulmonary disease with (acute) exacerbation: Secondary | ICD-10-CM | POA: Diagnosis present

## 2023-04-15 DIAGNOSIS — E782 Mixed hyperlipidemia: Secondary | ICD-10-CM | POA: Diagnosis present

## 2023-04-15 DIAGNOSIS — J9601 Acute respiratory failure with hypoxia: Secondary | ICD-10-CM | POA: Diagnosis present

## 2023-04-15 DIAGNOSIS — R7989 Other specified abnormal findings of blood chemistry: Secondary | ICD-10-CM | POA: Diagnosis present

## 2023-04-15 DIAGNOSIS — J9602 Acute respiratory failure with hypercapnia: Secondary | ICD-10-CM | POA: Diagnosis not present

## 2023-04-15 DIAGNOSIS — D66 Hereditary factor VIII deficiency: Secondary | ICD-10-CM | POA: Diagnosis present

## 2023-04-15 DIAGNOSIS — Z79899 Other long term (current) drug therapy: Secondary | ICD-10-CM | POA: Diagnosis not present

## 2023-04-15 DIAGNOSIS — Z7982 Long term (current) use of aspirin: Secondary | ICD-10-CM

## 2023-04-15 DIAGNOSIS — F410 Panic disorder [episodic paroxysmal anxiety] without agoraphobia: Secondary | ICD-10-CM | POA: Diagnosis present

## 2023-04-15 DIAGNOSIS — I130 Hypertensive heart and chronic kidney disease with heart failure and stage 1 through stage 4 chronic kidney disease, or unspecified chronic kidney disease: Secondary | ICD-10-CM | POA: Diagnosis present

## 2023-04-15 DIAGNOSIS — Z6841 Body Mass Index (BMI) 40.0 and over, adult: Secondary | ICD-10-CM

## 2023-04-15 DIAGNOSIS — J449 Chronic obstructive pulmonary disease, unspecified: Secondary | ICD-10-CM | POA: Diagnosis present

## 2023-04-15 LAB — CBC WITH DIFFERENTIAL/PLATELET
Abs Immature Granulocytes: 0.02 10*3/uL (ref 0.00–0.07)
Basophils Absolute: 0 10*3/uL (ref 0.0–0.1)
Basophils Relative: 0 %
Eosinophils Absolute: 0.1 10*3/uL (ref 0.0–0.5)
Eosinophils Relative: 2 %
HCT: 40 % (ref 36.0–46.0)
Hemoglobin: 11.8 g/dL — ABNORMAL LOW (ref 12.0–15.0)
Immature Granulocytes: 0 %
Lymphocytes Relative: 22 %
Lymphs Abs: 1.6 10*3/uL (ref 0.7–4.0)
MCH: 28.1 pg (ref 26.0–34.0)
MCHC: 29.5 g/dL — ABNORMAL LOW (ref 30.0–36.0)
MCV: 95.2 fL (ref 80.0–100.0)
Monocytes Absolute: 0.5 10*3/uL (ref 0.1–1.0)
Monocytes Relative: 7 %
Neutro Abs: 4.7 10*3/uL (ref 1.7–7.7)
Neutrophils Relative %: 69 %
Platelets: 235 10*3/uL (ref 150–400)
RBC: 4.2 MIL/uL (ref 3.87–5.11)
RDW: 15.5 % (ref 11.5–15.5)
WBC: 7 10*3/uL (ref 4.0–10.5)
nRBC: 0.4 % — ABNORMAL HIGH (ref 0.0–0.2)

## 2023-04-15 LAB — I-STAT CHEM 8, ED
BUN: 40 mg/dL — ABNORMAL HIGH (ref 6–20)
Calcium, Ion: 1.25 mmol/L (ref 1.15–1.40)
Chloride: 103 mmol/L (ref 98–111)
Creatinine, Ser: 1.6 mg/dL — ABNORMAL HIGH (ref 0.44–1.00)
Glucose, Bld: 197 mg/dL — ABNORMAL HIGH (ref 70–99)
HCT: 38 % (ref 36.0–46.0)
Hemoglobin: 12.9 g/dL (ref 12.0–15.0)
Potassium: 4.7 mmol/L (ref 3.5–5.1)
Sodium: 139 mmol/L (ref 135–145)
TCO2: 32 mmol/L (ref 22–32)

## 2023-04-15 LAB — BLOOD GAS, VENOUS
Acid-Base Excess: 6.3 mmol/L — ABNORMAL HIGH (ref 0.0–2.0)
Bicarbonate: 35.6 mmol/L — ABNORMAL HIGH (ref 20.0–28.0)
Drawn by: 66460
O2 Saturation: 65.7 %
Patient temperature: 36.9
pCO2, Ven: 74 mmHg (ref 44–60)
pH, Ven: 7.29 (ref 7.25–7.43)
pO2, Ven: 34 mmHg (ref 32–45)

## 2023-04-15 LAB — COMPREHENSIVE METABOLIC PANEL
ALT: 33 U/L (ref 0–44)
AST: 20 U/L (ref 15–41)
Albumin: 3.2 g/dL — ABNORMAL LOW (ref 3.5–5.0)
Alkaline Phosphatase: 106 U/L (ref 38–126)
Anion gap: 7 (ref 5–15)
BUN: 45 mg/dL — ABNORMAL HIGH (ref 6–20)
CO2: 29 mmol/L (ref 22–32)
Calcium: 8.7 mg/dL — ABNORMAL LOW (ref 8.9–10.3)
Chloride: 102 mmol/L (ref 98–111)
Creatinine, Ser: 1.49 mg/dL — ABNORMAL HIGH (ref 0.44–1.00)
GFR, Estimated: 40 mL/min — ABNORMAL LOW (ref >60)
Glucose, Bld: 202 mg/dL — ABNORMAL HIGH (ref 70–99)
Potassium: 4.6 mmol/L (ref 3.5–5.1)
Sodium: 138 mmol/L (ref 135–145)
Total Bilirubin: 1 mg/dL (ref 0.3–1.2)
Total Protein: 6.4 g/dL — ABNORMAL LOW (ref 6.5–8.1)

## 2023-04-15 LAB — CBG MONITORING, ED
Glucose-Capillary: 197 mg/dL — ABNORMAL HIGH (ref 70–99)
Glucose-Capillary: 182 mg/dL — ABNORMAL HIGH (ref 70–99)

## 2023-04-15 LAB — GLUCOSE, CAPILLARY
Glucose-Capillary: 416 mg/dL — ABNORMAL HIGH (ref 70–99)
Glucose-Capillary: 247 mg/dL — ABNORMAL HIGH (ref 70–99)

## 2023-04-15 LAB — BRAIN NATRIURETIC PEPTIDE: B Natriuretic Peptide: 510 pg/mL — ABNORMAL HIGH (ref 0.0–100.0)

## 2023-04-15 LAB — RESP PANEL BY RT-PCR (RSV, FLU A&B, COVID)  RVPGX2
Influenza A by PCR: NEGATIVE
Influenza B by PCR: NEGATIVE
Resp Syncytial Virus by PCR: NEGATIVE
SARS Coronavirus 2 by RT PCR: NEGATIVE

## 2023-04-15 LAB — MAGNESIUM: Magnesium: 2 mg/dL (ref 1.7–2.4)

## 2023-04-15 LAB — TROPONIN I (HIGH SENSITIVITY): Troponin I (High Sensitivity): 19 ng/L — ABNORMAL HIGH (ref <18)

## 2023-04-15 MED ORDER — INSULIN GLARGINE-YFGN 100 UNIT/ML ~~LOC~~ SOLN
35.0000 [IU] | Freq: Every day | SUBCUTANEOUS | Status: DC
  Filled 2023-04-15: qty 0.35

## 2023-04-15 MED ORDER — ONDANSETRON HCL 4 MG PO TABS
4.0000 mg | ORAL_TABLET | Freq: Four times a day (QID) | ORAL | Status: DC | PRN
  Administered 2023-04-17: 4 mg via ORAL
  Filled 2023-04-15: qty 1

## 2023-04-15 MED ORDER — ACETAMINOPHEN 650 MG RE SUPP: 650.0000 mg | Freq: Four times a day (QID) | RECTAL | Status: DC | PRN

## 2023-04-15 MED ORDER — INSULIN GLARGINE-YFGN 100 UNIT/ML ~~LOC~~ SOLN
35.0000 [IU] | Freq: Two times a day (BID) | SUBCUTANEOUS | Status: DC
  Filled 2023-04-15 (×2): qty 0.35

## 2023-04-15 MED ORDER — SODIUM CHLORIDE 0.9 % IV SOLN
1.0000 g | INTRAVENOUS | Status: DC
  Administered 2023-04-16 – 2023-04-18 (×3): 1 g via INTRAVENOUS
  Filled 2023-04-15 (×3): qty 10

## 2023-04-15 MED ORDER — INSULIN ASPART 100 UNIT/ML IJ SOLN
14.0000 [IU] | Freq: Three times a day (TID) | INTRAMUSCULAR | Status: DC
  Filled 2023-04-15: qty 1

## 2023-04-15 MED ORDER — METHYLPREDNISOLONE SODIUM SUCC 40 MG IJ SOLR
40.0000 mg | Freq: Two times a day (BID) | INTRAMUSCULAR | Status: DC
  Administered 2023-04-15 – 2023-04-18 (×6): 40 mg via INTRAVENOUS
  Filled 2023-04-15 (×6): qty 1

## 2023-04-15 MED ORDER — ALBUTEROL SULFATE (2.5 MG/3ML) 0.083% IN NEBU: 10.0000 mg/h | INHALATION_SOLUTION | Freq: Once | RESPIRATORY_TRACT | Status: AC

## 2023-04-15 MED ORDER — SENNOSIDES-DOCUSATE SODIUM 8.6-50 MG PO TABS
2.0000 | ORAL_TABLET | Freq: Every day | ORAL | Status: DC
  Administered 2023-04-15 – 2023-04-17 (×3): 2 via ORAL
  Filled 2023-04-15 (×3): qty 2

## 2023-04-15 MED ORDER — LORATADINE 10 MG PO TABS
10.0000 mg | ORAL_TABLET | Freq: Every day | ORAL | Status: DC
  Administered 2023-04-16 – 2023-04-18 (×3): 10 mg via ORAL
  Filled 2023-04-15 (×3): qty 1

## 2023-04-15 MED ORDER — GUAIFENESIN ER 600 MG PO TB12
1200.0000 mg | ORAL_TABLET | Freq: Two times a day (BID) | ORAL | Status: DC
  Administered 2023-04-15 – 2023-04-18 (×7): 1200 mg via ORAL
  Filled 2023-04-15 (×7): qty 2

## 2023-04-15 MED ORDER — IPRATROPIUM-ALBUTEROL 0.5-2.5 (3) MG/3ML IN SOLN
3.0000 mL | RESPIRATORY_TRACT | Status: DC
  Administered 2023-04-15 – 2023-04-17 (×11): 3 mL via RESPIRATORY_TRACT
  Filled 2023-04-15 (×13): qty 3

## 2023-04-15 MED ORDER — ASPIRIN 81 MG PO TBEC
81.0000 mg | DELAYED_RELEASE_TABLET | ORAL | Status: DC
  Administered 2023-04-15 – 2023-04-18 (×4): 81 mg via ORAL
  Filled 2023-04-15 (×4): qty 1

## 2023-04-15 MED ORDER — ENOXAPARIN SODIUM 80 MG/0.8ML IJ SOSY
70.0000 mg | PREFILLED_SYRINGE | INTRAMUSCULAR | Status: DC
  Administered 2023-04-15 – 2023-04-17 (×3): 70 mg via SUBCUTANEOUS
  Filled 2023-04-15 (×3): qty 0.8

## 2023-04-15 MED ORDER — INSULIN GLARGINE-YFGN 100 UNIT/ML ~~LOC~~ SOLN
40.0000 [IU] | Freq: Two times a day (BID) | SUBCUTANEOUS | Status: DC
  Administered 2023-04-15 – 2023-04-16 (×2): 40 [IU] via SUBCUTANEOUS
  Filled 2023-04-15 (×4): qty 0.4

## 2023-04-15 MED ORDER — ROSUVASTATIN CALCIUM 20 MG PO TABS
20.0000 mg | ORAL_TABLET | Freq: Every evening | ORAL | Status: DC
  Administered 2023-04-15 – 2023-04-17 (×3): 20 mg via ORAL
  Filled 2023-04-15 (×3): qty 1

## 2023-04-15 MED ORDER — FLUOXETINE HCL 20 MG PO CAPS
20.0000 mg | ORAL_CAPSULE | Freq: Every day | ORAL | Status: DC
  Administered 2023-04-16 – 2023-04-18 (×3): 20 mg via ORAL
  Filled 2023-04-15 (×3): qty 1

## 2023-04-15 MED ORDER — FENTANYL CITRATE PF 50 MCG/ML IJ SOSY: 25.0000 ug | PREFILLED_SYRINGE | INTRAMUSCULAR | Status: DC | PRN

## 2023-04-15 MED ORDER — OXYCODONE HCL 5 MG PO TABS
5.0000 mg | ORAL_TABLET | Freq: Four times a day (QID) | ORAL | Status: DC | PRN
  Administered 2023-04-16 – 2023-04-17 (×3): 5 mg via ORAL
  Filled 2023-04-15 (×3): qty 1

## 2023-04-15 MED ORDER — ISOSORBIDE MONONITRATE ER 60 MG PO TB24
30.0000 mg | ORAL_TABLET | Freq: Every day | ORAL | Status: DC
  Administered 2023-04-15 – 2023-04-18 (×4): 30 mg via ORAL
  Filled 2023-04-15 (×4): qty 1

## 2023-04-15 MED ORDER — INSULIN ASPART 100 UNIT/ML IJ SOLN
12.0000 [IU] | Freq: Three times a day (TID) | INTRAMUSCULAR | Status: DC
  Administered 2023-04-15 – 2023-04-16 (×3): 12 [IU] via SUBCUTANEOUS

## 2023-04-15 MED ORDER — PROPRANOLOL HCL 20 MG PO TABS
60.0000 mg | ORAL_TABLET | Freq: Every day | ORAL | Status: DC
  Administered 2023-04-15 – 2023-04-18 (×4): 60 mg via ORAL
  Filled 2023-04-15: qty 3
  Filled 2023-04-15: qty 6
  Filled 2023-04-15 (×2): qty 3

## 2023-04-15 MED ORDER — PANTOPRAZOLE SODIUM 40 MG PO TBEC
40.0000 mg | DELAYED_RELEASE_TABLET | Freq: Every day | ORAL | Status: DC
  Administered 2023-04-15 – 2023-04-18 (×4): 40 mg via ORAL
  Filled 2023-04-15 (×4): qty 1

## 2023-04-15 MED ORDER — INSULIN ASPART 100 UNIT/ML IJ SOLN
0.0000 [IU] | Freq: Every day | INTRAMUSCULAR | Status: DC
  Administered 2023-04-15 – 2023-04-16 (×2): 3 [IU] via SUBCUTANEOUS
  Administered 2023-04-17: 2 [IU] via SUBCUTANEOUS

## 2023-04-15 MED ORDER — FUROSEMIDE 10 MG/ML IJ SOLN
40.0000 mg | Freq: Once | INTRAMUSCULAR | Status: AC
  Administered 2023-04-15: 40 mg via INTRAVENOUS
  Filled 2023-04-15: qty 4

## 2023-04-15 MED ORDER — MUPIROCIN 2 % EX OINT
TOPICAL_OINTMENT | Freq: Two times a day (BID) | CUTANEOUS | Status: DC
  Filled 2023-04-15 (×3): qty 22

## 2023-04-15 MED ORDER — SODIUM CHLORIDE 0.9 % IV SOLN
500.0000 mg | Freq: Once | INTRAVENOUS | Status: AC
  Administered 2023-04-15: 500 mg via INTRAVENOUS
  Filled 2023-04-15: qty 5

## 2023-04-15 MED ORDER — INSULIN ASPART 100 UNIT/ML IJ SOLN
0.0000 [IU] | Freq: Three times a day (TID) | INTRAMUSCULAR | Status: DC
  Administered 2023-04-15: 20 [IU] via SUBCUTANEOUS
  Administered 2023-04-15: 4 [IU] via SUBCUTANEOUS
  Administered 2023-04-16: 7 [IU] via SUBCUTANEOUS
  Administered 2023-04-16: 4 [IU] via SUBCUTANEOUS
  Administered 2023-04-16: 11 [IU] via SUBCUTANEOUS
  Administered 2023-04-17: 20 [IU] via SUBCUTANEOUS
  Administered 2023-04-17: 7 [IU] via SUBCUTANEOUS
  Administered 2023-04-18: 11 [IU] via SUBCUTANEOUS
  Filled 2023-04-15: qty 1

## 2023-04-15 MED ORDER — MIRABEGRON ER 25 MG PO TB24
25.0000 mg | ORAL_TABLET | Freq: Every day | ORAL | Status: DC
  Administered 2023-04-15 – 2023-04-18 (×4): 25 mg via ORAL
  Filled 2023-04-15 (×4): qty 1

## 2023-04-15 MED ORDER — ACETAMINOPHEN 325 MG PO TABS: 650.0000 mg | ORAL_TABLET | Freq: Four times a day (QID) | ORAL | Status: DC | PRN

## 2023-04-15 MED ORDER — ALBUTEROL SULFATE (2.5 MG/3ML) 0.083% IN NEBU
INHALATION_SOLUTION | RESPIRATORY_TRACT | Status: AC
  Filled 2023-04-15: qty 3

## 2023-04-15 MED ORDER — CHLORHEXIDINE GLUCONATE CLOTH 2 % EX PADS
6.0000 | MEDICATED_PAD | Freq: Every day | CUTANEOUS | Status: DC
  Administered 2023-04-15 – 2023-04-17 (×3): 6 via TOPICAL

## 2023-04-15 MED ORDER — OXCARBAZEPINE 300 MG PO TABS
300.0000 mg | ORAL_TABLET | Freq: Two times a day (BID) | ORAL | Status: DC
  Administered 2023-04-15 – 2023-04-18 (×7): 300 mg via ORAL
  Filled 2023-04-15 (×9): qty 1

## 2023-04-15 MED ORDER — SODIUM CHLORIDE 0.9 % IV SOLN
1.0000 g | Freq: Once | INTRAVENOUS | Status: AC
  Administered 2023-04-15: 1 g via INTRAVENOUS
  Filled 2023-04-15: qty 10

## 2023-04-15 MED ORDER — AZITHROMYCIN 250 MG PO TABS
500.0000 mg | ORAL_TABLET | Freq: Every day | ORAL | Status: AC
  Administered 2023-04-16 – 2023-04-18 (×3): 500 mg via ORAL
  Filled 2023-04-15 (×3): qty 2

## 2023-04-15 MED ORDER — ONDANSETRON HCL 4 MG/2ML IJ SOLN: 4.0000 mg | Freq: Four times a day (QID) | INTRAMUSCULAR | Status: DC | PRN

## 2023-04-15 MED ORDER — DEXTROMETHORPHAN POLISTIREX ER 30 MG/5ML PO SUER: 30.0000 mg | Freq: Two times a day (BID) | ORAL | Status: DC | PRN

## 2023-04-15 MED ORDER — ALBUTEROL SULFATE (2.5 MG/3ML) 0.083% IN NEBU
INHALATION_SOLUTION | RESPIRATORY_TRACT | Status: AC
  Filled 2023-04-15: qty 9

## 2023-04-15 MED ORDER — OLANZAPINE 5 MG PO TABS
10.0000 mg | ORAL_TABLET | Freq: Every day | ORAL | Status: DC
  Administered 2023-04-15 – 2023-04-17 (×3): 10 mg via ORAL
  Filled 2023-04-15 (×3): qty 2

## 2023-04-15 NOTE — ED Notes (Signed)
AC reports bed not available in ICU. Will notify charge nurse when bed available.

## 2023-04-15 NOTE — ED Provider Notes (Addendum)
[place] EMERGENCY DEPARTMENT AT [facility] Provider Note   CSN: [id number] Arrival date & time: 04/15/23  0756     History  Chief Complaint  Patient presents with   Shortness of Breath    [name] is a 57 y.o. female.   Shortness of Breath Patient presents for shortness of breath.  Medical history includes bipolar disorder, seizures, T2DM, COPD, HLD, HTN.  She had a recent hospital admission for hypoxic respiratory failure.  She was discharged 10 days ago.  Patient states that she has continued to be short of breath since she has returned home.  Shortness of breath worsened today.  When EMS arrived on scene, patient was 78% SpO2 on room air.  She had increased work of breathing and diffuse wheezing on lung auscultation.  She received 125 mg of Solu-Medrol and 1 DuoNeb prior to arrival.       Home Medications Prior to Admission medications   Medication Sig Start Date End Date Taking? Authorizing Provider  aspirin EC 81 MG tablet Take 81 mg by mouth every morning.    [provider]  insulin aspart (NOVOLOG) 100 UNIT/ML injection CBG < 70: implement hypoglycemia protocol CBG 70 - 120: 0 units CBG 121 - 150: 3 units CBG 151 - 200: 4 units CBG 201 - 250: 7 units CBG 251 - 300: 11 units CBG 301 - 350: 15 units CBG 351 - 400: 20 units Patient taking differently: Inject 3-20 Units into the skin 3 (three) times daily with meals. CBG < 70: implement hypoglycemia protocol CBG 70 - 120: 0 units CBG 121 - 150: 3 units      CBG 151 - 200: 4 units CBG 201 - 250: 7 units CBG 251 - 300: 11 units CBG 301 - 350: 15 units CBG 351 - 400: 20 units 04/20/14   Kathlen Mody, MD  isosorbide mononitrate (IMDUR) 30 MG 24 hr tablet Take 1 tablet (30 mg total) by mouth daily. 07/08/22   Terrilee Files, MD  LANTUS SOLOSTAR 100 UNIT/ML Solostar Pen Inject 45 Units into the skin 2 (two) times daily. 02/13/23   [provider]  lisinopril (ZESTRIL) 2.5 MG tablet  Take 2.5 mg by mouth daily. 02/03/23   [provider]  loratadine (CLARITIN) 10 MG tablet Take 10 mg by mouth daily.    [provider]  metFORMIN (GLUCOPHAGE) 500 MG tablet Take 1 tablet (500 mg total) by mouth 2 (two) times daily with a meal. 07/08/22   Terrilee Files, MD  MYRBETRIQ 25 MG TB24 tablet Take 25 mg by mouth daily. 02/03/23   [provider]  OLANZapine (ZYPREXA) 10 MG tablet Take 1 tablet (10 mg total) by mouth at bedtime. 04/05/23   Catarina Hartshorn, MD  Oxcarbazepine (TRILEPTAL) 300 MG tablet Take 1 tablet (300 mg total) by mouth 2 (two) times daily. 04/05/23   Catarina Hartshorn, MD  propranolol (INDERAL) 60 MG tablet Take 1 tablet (60 mg total) by mouth daily. On an empty stomach 07/08/22   Terrilee Files, MD  rosuvastatin (CRESTOR) 20 MG tablet Take 20 mg by mouth daily. 02/03/23   [provider]      Allergies    Bee venom    Review of Systems   Review of Systems  Respiratory:  Positive for chest tightness and shortness of breath.   All other systems reviewed and are negative.   Physical Exam Updated Vital Signs BP (!) 149/87 (BP Location: Left Arm)  Pulse 83   Temp 98.4 F (36.9 C) (Oral)   Resp 18   Ht 5\' 7"  (1.702 m)   Wt (!) 148.8 kg   SpO2 93%   BMI 51.39 kg/m  Physical Exam Vitals and nursing note reviewed.  Constitutional:      General: She is not in acute distress.    Appearance: She is well-developed. She is ill-appearing. She is not toxic-appearing or diaphoretic.  HENT:     Head: Normocephalic and atraumatic.     Mouth/Throat:     Mouth: Mucous membranes are moist.  Eyes:     Conjunctiva/sclera: Conjunctivae normal.  Cardiovascular:     Rate and Rhythm: Normal rate and regular rhythm.     Heart sounds: No murmur heard. Pulmonary:     Effort: Tachypnea present. No respiratory distress.     Breath sounds: Wheezing and rales present.  Chest:     Chest wall: No tenderness.  Abdominal:     Palpations: Abdomen is  soft.     Tenderness: There is no abdominal tenderness.  Musculoskeletal:        General: No swelling. Normal range of motion.     Cervical back: Normal range of motion and neck supple.  Skin:    General: Skin is warm and dry.     Capillary Refill: Capillary refill takes less than 2 seconds.     Coloration: Skin is not cyanotic or pale.  Neurological:     General: No focal deficit present.     Mental Status: She is alert and oriented to person, place, and time.  Psychiatric:        Mood and Affect: Mood normal.        Behavior: Behavior normal.     ED Results / Procedures / Treatments   Labs (all labs ordered are listed, but only abnormal results are displayed) Labs Reviewed  COMPREHENSIVE METABOLIC PANEL - Abnormal; Notable for the following components:      Result Value   Glucose, Bld 202 (*)    BUN 45 (*)    Creatinine, Ser 1.49 (*)    Calcium 8.7 (*)    Total Protein 6.4 (*)    Albumin 3.2 (*)    GFR, Estimated 40 (*)    All other components within normal limits  BRAIN NATRIURETIC PEPTIDE - Abnormal; Notable for the following components:   B Natriuretic Peptide 510.0 (*)    All other components within normal limits  BLOOD GAS, VENOUS - Abnormal; Notable for the following components:   pCO2, Ven 74 (*)    Bicarbonate 35.6 (*)    Acid-Base Excess 6.3 (*)    All other components within normal limits  CBC WITH DIFFERENTIAL/PLATELET - Abnormal; Notable for the following components:   Hemoglobin 11.8 (*)    MCHC 29.5 (*)    nRBC 0.4 (*)    All other components within normal limits  CBG MONITORING, ED - Abnormal; Notable for the following components:   Glucose-Capillary 197 (*)    All other components within normal limits  I-STAT CHEM 8, ED - Abnormal; Notable for the following components:   BUN 40 (*)    Creatinine, Ser 1.60 (*)    Glucose, Bld 197 (*)    All other components within normal limits  TROPONIN I (HIGH SENSITIVITY) - Abnormal; Notable for the following  components:   Troponin I (High Sensitivity) 19 (*)    All other components within normal limits  RESP PANEL BY RT-PCR (RSV, FLU  A&B, COVID)  RVPGX2  MAGNESIUM  TROPONIN I (HIGH SENSITIVITY)    EKG EKG Interpretation Date/Time:  Sunday April 15 2023 08:02:49 EDT Ventricular Rate:  89 PR Interval:  151 QRS Duration:  133 QT Interval:  351 QTC Calculation: 427 R Axis:   259  Text Interpretation: Sinus rhythm RBBB and LAFB Confirmed by Gloris Manchester [phone]) on 04/15/2023 8:07:04 AM  Radiology DG Chest Port 1 View  Result Date: 04/15/2023 CLINICAL DATA:  dyspnea EXAM: PORTABLE CHEST - 1 VIEW COMPARISON:  04/04/2023 FINDINGS: Relatively low lung volumes with somewhat coarse bronchovascular markings. Some scattered of the lower opacities in the left upper lobe are new since previous. Heart size upper limits normal. Aortic Atherosclerosis (ICD10-170.0). No effusion. Visualized bones unremarkable. IMPRESSION: Low lung volumes with new left upper lobe opacities suggesting infiltrate. Electronically Signed   By: Corlis Leak M.D.   On: 04/15/2023 08:23    Procedures Procedures    Medications Ordered in ED Medications  cefTRIAXone (ROCEPHIN) 1 g in sodium chloride 0.9 % 100 mL IVPB (1 g Intravenous New Bag/Given 04/15/23 0932)  azithromycin (ZITHROMAX) 500 mg in sodium chloride 0.9 % 250 mL IVPB (has no administration in time range)  albuterol (PROVENTIL) (2.5 MG/3ML) 0.083% nebulizer solution ( Nebulization Given 04/15/23 0831)  albuterol (PROVENTIL) (2.5 MG/3ML) 0.083% nebulizer solution (  Given 04/15/23 0916)  furosemide (LASIX) injection 40 mg (40 mg Intravenous Given 04/15/23 0960)    ED Course/ Medical Decision Making/ A&P                             Medical Decision Making Amount and/or Complexity of Data Reviewed Labs: ordered. Radiology: ordered.  Risk Prescription drug management. Decision regarding hospitalization.   This patient presents to the ED for concern of shortness of  breath, this involves an extensive number of treatment options, and is a complaint that carries with it a high risk of complications and morbidity.  The differential diagnosis includes COPD exacerbation, CHF, pneumonia, viral illness, pulmonary edema, ACS   Co morbidities that complicate the patient evaluation  bipolar disorder, seizures, T2DM, COPD, HLD, HTN   Additional history obtained:  Additional history obtained from EMS External records from outside source obtained and reviewed including EMR   Lab Tests:  I Ordered, and personally interpreted labs.  The pertinent results include: Respiratory acidosis on blood gas, elevated BNP, creatinine elevated from baseline.   Imaging Studies ordered:  I ordered imaging studies including chest x-ray I independently visualized and interpreted imaging which showed left upper lobe infiltrate I agree with the radiologist interpretation   Cardiac Monitoring: / EKG:  The patient was maintained on a cardiac monitor.  I personally viewed and interpreted the cardiac monitored which showed an underlying rhythm of: Sinus rhythm   Problem List / ED Course / Critical interventions / Medication management  Patient presents for shortness of breath which she states has been present since her prior hospitalization and has worsened today.  Per chart review, prior shortness of breath and respiratory failure during recent admission was attributed to multiple factors: OSA/OHS, fluid overload, and reactive airway disease.  Although she does not have a smoking history, she has been diagnosed with COPD.  During her prior hospitalization, her symptoms did improve with IV Lasix.  Her echocardiogram from earlier this month showed preserved LVEF with grade 1 diastolic dysfunction.  On arrival today, patient reportedly has improved work of breathing following Solu-Medrol and DuoNeb.  On arrival, she is speaking in complete sentences.  She has garbled speech which she  states is baseline for her. She remains tachypneic.  SpO2 is 90% on 4 L.  She has coarse lung sounds on lung auscultation.  B-lines are present on bedside ultrasound.  Patient was given continued nebulized breathing treatment.  Lab work was initiated.  Blood gas showed respiratory acidosis.  BiPAP was ordered.  Patient has elevated BNP with normal electrolytes.  IV Lasix was ordered.  X-ray showed concern of left upper lobe infiltrate.  Antibiotics were ordered.  Patient remained hemodynamically stable.  She had improved breathing while in the ED.  She was admitted for further management. I ordered medication including albuterol for COPD; Lasix for diuresis; ceftriaxone and azithromycin for treatment of pneumonia Reevaluation of the patient after these medicines showed that the patient improved I have reviewed the patients home medicines and have made adjustments as needed   Social Determinants of Health:  Lives at home with family  CRITICAL CARE Performed by: Gloris Manchester   Total critical care time: 34 minutes  Critical care time was exclusive of separately billable procedures and treating other patients.  Critical care was necessary to treat or prevent imminent or life-threatening deterioration.  Critical care was time spent personally by me on the following activities: development of treatment plan with patient and/or surrogate as well as nursing, discussions with consultants, evaluation of patient's response to treatment, examination of patient, obtaining history from patient or surrogate, ordering and performing treatments and interventions, ordering and review of laboratory studies, ordering and review of radiographic studies, pulse oximetry and re-evaluation of patient's condition.         Final Clinical Impression(s) / ED Diagnoses Final diagnoses:  Acute on chronic respiratory failure with hypoxia and hypercapnia [facility])    Rx / DC Orders ED Discharge Orders     None          Gloris Manchester, MD 04/15/23 [id number]    Gloris Manchester, MD 04/15/23 0945

## 2023-04-15 NOTE — Progress Notes (Signed)
1240- Patient transported from ED to ICU11 without issue. Patient removed from Bipap and placed on [place] 4Lpm. No distress. Patient plans to eat lunch.

## 2023-04-15 NOTE — Hospital Course (Signed)
57 year old female with bipolar disorder, history of cognitive impairment, seizure disorder, OSA/OHS, uncontrolled type 2 diabetes mellitus, chronic respiratory failure on supplemental oxygen, stage IIIa CKD, grade 1 diastolic heart failure, morbid obesity, hyperlipidemia presented to the ED by [facility] with acute on chronic respiratory failure.  She was found to be 78% on room air oxygen.  She was having acute respiratory distress symptoms.  Her family called EMS.  She was given IV Solu-Medrol en route to ED.   Pt had just been discharged from [facility] on 04/05/23 with an admission for acute on chronic respiratory failure with hypoxia and hypercarbia.  She was treated with IV furosemide. She reported never smoking or using tobacco. Also was treated with steroids.  She was evaluated by TTS for acute psychosis and was psych cleared to DC home. She was started on zyprexa 10 mg at bedtime  and oxcarbazepine 300 mg BID with outpatient follow up [facility] behavioral health provider.    Pt found to have a pCO2 of 74 and pH of 7.29 and was started on bipap therapy and hospital admission was requested.

## 2023-04-15 NOTE — ED Notes (Signed)
Per [facility], pt can be moved to ready bed when another pt is picked up by [facility]. Awaiting all clear to move patient to ICU.

## 2023-04-15 NOTE — ED Notes (Signed)
Novolog insulin 14 units not given d/t pt has not had anything to eat. MD notified.

## 2023-04-15 NOTE — Plan of Care (Signed)

## 2023-04-15 NOTE — ED Notes (Signed)
No fecal impaction noted on digital assessment.

## 2023-04-15 NOTE — ED Triage Notes (Signed)
Patient  presents to ED via [facility]. Patient c/o shortness of breath this morning. Patient reports she was in the  hospital on last week . Her breathing continues to worsen. Reports that she has a history of COPD. EMS gave Solu-Medrol 125  and Duoneb treatment. CBG 180.

## 2023-04-15 NOTE — Progress Notes (Signed)
Patient was very sleepy during breathing treatment and seemed confused. She woke up long enough for me to ask her if she could go back on the BIPAP and she was agreeable. Placed patient back on BIPAP with previous settings. Patient tolerating well at this time.

## 2023-04-15 NOTE — ED Notes (Signed)
ED TO INPATIENT HANDOFF REPORT  ED Nurse Name and Phone #: [phone]  S Name/Age/Gender [name] 57 y.o. female Room/Bed: APA02/APA02  Code Status   Code Status: Full Code  Home/SNF/Other Home Patient oriented to: self, place, time, and situation Is this baseline? Yes   Triage Complete: Triage complete  Chief Complaint Acute respiratory failure with hypoxia and hypercarbia (HCC) [J96.01, J96.02]  Triage Note Patient  presents to ED via [facility]. Patient c/o shortness of breath this morning. Patient reports she was in the  hospital on last week . Her breathing continues to worsen. Reports that she has a history of COPD. EMS gave Solu-Medrol 125  and Duoneb treatment. CBG 180.   Allergies Allergies  Allergen Reactions   Bee Venom Hives    Level of Care/Admitting Diagnosis ED Disposition     ED Disposition  Admit   Condition  --   Comment  Hospital Area: [facility] [[id number]]  Level of Care: Stepdown [14]  Covid Evaluation: Asymptomatic - no recent exposure (last 10 days) testing not required  Diagnosis: Acute respiratory failure with hypoxia and hypercarbia [facility]) [[id number]]  Admitting Physician: Cleora Fleet [[id number]]  Attending Physician: Cleora Fleet [[id number]]  Certification:: I certify this patient will need inpatient services for at least 2 midnights  Estimated Length of Stay: 5          B Medical/Surgery History Past Medical History:  Diagnosis Date   Agitation 07/28/2022   Anxiety    COPD (chronic obstructive pulmonary disease) (HCC)    Diabetes mellitus    Fear of    falling   Hemophilia (HCC)    Hypertension    Panic attacks    Parkinson's disease    Seizures (HCC)    Past Surgical History:  Procedure Laterality Date   ABDOMINAL HYSTERECTOMY     OOPHORECTOMY     SHOULDER CLOSED REDUCTION Right 04/22/2021   Procedure: CLOSED REDUCTION SHOULDER;  Surgeon: Vickki Hearing, MD;  Location: [facility];  Service:  Orthopedics;  Laterality: Right;   SHOULDER CLOSED REDUCTION Right 04/25/2021   Procedure: CLOSED REDUCTION SHOULDER;  Surgeon: Vickki Hearing, MD;  Location: [facility];  Service: Orthopedics;  Laterality: Right;     A IV Location/Drains/Wounds Patient Lines/Drains/Airways Status     Active Line/Drains/Airways     Name Placement date Placement time Site Days   Peripheral IV 04/15/23 20 G Right Antecubital 04/15/23  0715  Antecubital  less than 1   Peripheral IV 04/15/23 20 G Posterior;Right Wrist 04/15/23  0959  Wrist  less than 1   Wound / Incision (Open or Dehisced) 04/17/14 Other (Comment) Arm Left blisters 04/17/14  --  Arm  3285            Intake/Output Last 24 hours  Intake/Output Summary (Last 24 hours) at 04/15/2023 1035 Last data filed at 04/15/2023 1003 Gross per 24 hour  Intake 100 ml  Output --  Net 100 ml    Labs/Imaging Results for orders placed or performed during the hospital encounter of 04/15/23 (from the past 48 hour(s))  Resp panel by RT-PCR (RSV, Flu A&B, Covid) Anterior Nasal Swab     Status: None   Collection Time: 04/15/23  8:00 AM   Specimen: Anterior Nasal Swab  Result Value Ref Range   SARS Coronavirus 2 by RT PCR NEGATIVE NEGATIVE    Comment: (NOTE) SARS-CoV-2 target nucleic acids are NOT DETECTED.  The SARS-CoV-2 RNA is generally detectable in upper  respiratory specimens during the acute phase of infection. The lowest concentration of SARS-CoV-2 viral copies this assay can detect is 138 copies/mL. A negative result does not preclude SARS-Cov-2 infection and should not be used as the sole basis for treatment or other patient management decisions. A negative result may occur with  improper specimen collection/handling, submission of specimen other than nasopharyngeal swab, presence of viral mutation(s) within the areas targeted by this assay, and inadequate number of viral copies(<138 copies/mL). A negative result must be combined  with clinical observations, patient history, and epidemiological information. The expected result is Negative.  Fact Sheet for Patients:  BloggerCourse.com  Fact Sheet for Healthcare Providers:  SeriousBroker.it  This test is no t yet approved or cleared by the [place] FDA and  has been authorized for detection and/or diagnosis of SARS-CoV-2 by FDA under an Emergency Use Authorization (EUA). This EUA will remain  in effect (meaning this test can be used) for the duration of the COVID-19 declaration under Section 564(b)(1) of the Act, 21 U.S.C.section 360bbb-3(b)(1), unless the authorization is terminated  or revoked sooner.       Influenza A by PCR NEGATIVE NEGATIVE   Influenza B by PCR NEGATIVE NEGATIVE    Comment: (NOTE) The Xpert Xpress SARS-CoV-2/FLU/RSV plus assay is intended as an aid in the diagnosis of influenza from Nasopharyngeal swab specimens and should not be used as a sole basis for treatment. Nasal washings and aspirates are unacceptable for Xpert Xpress SARS-CoV-2/FLU/RSV testing.  Fact Sheet for Patients: BloggerCourse.com  Fact Sheet for Healthcare Providers: SeriousBroker.it  This test is not yet approved or cleared by the [place] FDA and has been authorized for detection and/or diagnosis of SARS-CoV-2 by FDA under an Emergency Use Authorization (EUA). This EUA will remain in effect (meaning this test can be used) for the duration of the COVID-19 declaration under Section 564(b)(1) of the Act, 21 U.S.C. section 360bbb-3(b)(1), unless the authorization is terminated or revoked.     Resp Syncytial Virus by PCR NEGATIVE NEGATIVE    Comment: (NOTE) Fact Sheet for Patients: BloggerCourse.com  Fact Sheet for Healthcare Providers: SeriousBroker.it  This test is not yet approved or cleared by the  [place] FDA and has been authorized for detection and/or diagnosis of SARS-CoV-2 by FDA under an Emergency Use Authorization (EUA). This EUA will remain in effect (meaning this test can be used) for the duration of the COVID-19 declaration under Section 564(b)(1) of the Act, 21 U.S.C. section 360bbb-3(b)(1), unless the authorization is terminated or revoked.  Performed at [facility], [address], [place], [place] [address]   Comprehensive metabolic panel     Status: Abnormal   Collection Time: 04/15/23  8:00 AM  Result Value Ref Range   Sodium 138 135 - 145 mmol/L   Potassium 4.6 3.5 - 5.1 mmol/L   Chloride 102 98 - 111 mmol/L   CO2 29 22 - 32 mmol/L   Glucose, Bld 202 (H) 70 - 99 mg/dL    Comment: Glucose reference range applies only to samples taken after fasting for at least 8 hours.   BUN 45 (H) 6 - 20 mg/dL   Creatinine, Ser 6.21 (H) 0.44 - 1.00 mg/dL   Calcium 8.7 (L) 8.9 - 10.3 mg/dL   Total Protein 6.4 (L) 6.5 - 8.1 g/dL   Albumin 3.2 (L) 3.5 - 5.0 g/dL   AST 20 15 - 41 U/L   ALT 33 0 - 44 U/L   Alkaline Phosphatase 106 38 -  126 U/L   Total Bilirubin 1.0 0.3 - 1.2 mg/dL   GFR, Estimated 40 (L) >60 mL/min    Comment: (NOTE) Calculated using the CKD-EPI Creatinine Equation (2021)    Anion gap 7 5 - 15    Comment: Performed at [facility], [address] [place], [place] [address]  Brain natriuretic peptide     Status: Abnormal   Collection Time: 04/15/23  8:00 AM  Result Value Ref Range   B Natriuretic Peptide 510.0 (H) 0.0 - 100.0 pg/mL    Comment: Performed at [facility], [address], [place], [place] [address]  Magnesium     Status: None   Collection Time: 04/15/23  8:00 AM  Result Value Ref Range   Magnesium 2.0 1.7 - 2.4 mg/dL    Comment: Performed at [facility], [address], [place], [place] [address]  CBC with Differential/Platelet     Status: Abnormal   Collection Time: 04/15/23  8:00 AM  Result Value Ref Range   WBC 7.0 4.0 - 10.5 K/uL    RBC 4.20 3.87 - 5.11 MIL/uL   Hemoglobin 11.8 (L) 12.0 - 15.0 g/dL   HCT 91.4 78.2 - 95.6 %   MCV 95.2 80.0 - 100.0 fL   MCH 28.1 26.0 - 34.0 pg   MCHC 29.5 (L) 30.0 - 36.0 g/dL   RDW 21.3 08.6 - 57.8 %   Platelets 235 150 - 400 K/uL   nRBC 0.4 (H) 0.0 - 0.2 %   Neutrophils Relative % 69 %   Neutro Abs 4.7 1.7 - 7.7 K/uL   Lymphocytes Relative 22 %   Lymphs Abs 1.6 0.7 - 4.0 K/uL   Monocytes Relative 7 %   Monocytes Absolute 0.5 0.1 - 1.0 K/uL   Eosinophils Relative 2 %   Eosinophils Absolute 0.1 0.0 - 0.5 K/uL   Basophils Relative 0 %   Basophils Absolute 0.0 0.0 - 0.1 K/uL   Immature Granulocytes 0 %   Abs Immature Granulocytes 0.02 0.00 - 0.07 K/uL    Comment: Performed at [facility], [address], [place], [place] [address]  Troponin I (High Sensitivity)     Status: Abnormal   Collection Time: 04/15/23  8:00 AM  Result Value Ref Range   Troponin I (High Sensitivity) 19 (H) <18 ng/L    Comment: (NOTE) Elevated high sensitivity troponin I (hsTnI) values and significant  changes across serial measurements may suggest ACS but many other  chronic and acute conditions are known to elevate hsTnI results.  Refer to the "Links" section for chest pain algorithms and additional  guidance. Performed at [facility], [address], [place], [place] [address]   CBG monitoring, ED     Status: Abnormal   Collection Time: 04/15/23  8:04 AM  Result Value Ref Range   Glucose-Capillary 197 (H) 70 - 99 mg/dL    Comment: Glucose reference range applies only to samples taken after fasting for at least 8 hours.  I-Stat Chem 8, ED     Status: Abnormal   Collection Time: 04/15/23  8:09 AM  Result Value Ref Range   Sodium 139 135 - 145 mmol/L   Potassium 4.7 3.5 - 5.1 mmol/L   Chloride 103 98 - 111 mmol/L   BUN 40 (H) 6 - 20 mg/dL   Creatinine, Ser 1.32 (H) 0.44 - 1.00 mg/dL   Glucose, Bld 440 (H) 70 - 99 mg/dL    Comment: Glucose reference range applies only to samples taken after  fasting for at least 8 hours.   Calcium, Ion 1.25 1.15 - 1.40 mmol/L   TCO2 32 22 - 32 mmol/L   Hemoglobin 12.9 12.0 - 15.0 g/dL   HCT 84.1 66.0 - 63.0 %  Blood gas, venous     Status: Abnormal   Collection Time: 04/15/23  8:31 AM  Result Value Ref Range   pH, Ven 7.29 7.25 - 7.43   pCO2, Ven 74 (HH) 44 - 60 mmHg    Comment: CRITICAL RESULT CALLED TO, READ BACK BY AND VERIFIED WITH: S Jullia Mulligan AT 0837 ON [id number] BY S DALTON    pO2, Ven 34 32 - 45 mmHg   Bicarbonate 35.6 (H) 20.0 - 28.0 mmol/L   Acid-Base Excess 6.3 (H) 0.0 - 2.0 mmol/L   O2 Saturation 65.7 %   Patient temperature 36.9    Collection site BLOOD RIGHT HAND    Drawn by [phone]     Comment: Performed at [facility], [address], [place], [place] [address]   DG Chest Port 1 View  Result Date: 04/15/2023 CLINICAL DATA:  dyspnea EXAM: PORTABLE CHEST - 1 VIEW COMPARISON:  04/04/2023 FINDINGS: Relatively low lung volumes with somewhat coarse bronchovascular markings. Some scattered of the lower opacities in the left upper lobe are new since previous. Heart size upper limits normal. Aortic Atherosclerosis (ICD10-170.0). No effusion. Visualized bones unremarkable. IMPRESSION: Low lung volumes with new left upper lobe opacities suggesting infiltrate. Electronically Signed   By: Corlis Leak M.D.   On: 04/15/2023 08:23    Pending Labs Unresulted Labs (From admission, onward)     Start     Ordered   04/22/23 0500  Creatinine, serum  (enoxaparin (LOVENOX)    CrCl >/= 30 ml/min)  Weekly,   R     Comments: while on enoxaparin therapy    04/15/23 1001   04/16/23 0500  Basic metabolic panel  Daily,   R      04/15/23 1001   04/16/23 0500  Magnesium  Daily,   R      04/15/23 1001            Vitals/Pain Today's Vitals   04/15/23 0811 04/15/23 0821 04/15/23 0930 04/15/23 0945  BP: (!) 149/87  136/89   Pulse: 83  83   Resp: 18     Temp: 98.4 F (36.9 C)     TempSrc: Oral     SpO2: 93%  100%   Weight:  (!) 148.8 kg     Height:  5\' 7"  (1.702 m)    PainSc:    8     Isolation Precautions No active isolations  Medications Medications  azithromycin (ZITHROMAX) 500 mg in sodium chloride 0.9 % 250 mL IVPB (500 mg Intravenous New Bag/Given 04/15/23 1000)  insulin aspart (novoLOG) injection 0-20 Units (has no administration in time range)  insulin aspart (novoLOG) injection 0-5 Units (has no administration in time range)  insulin aspart (novoLOG) injection 14 Units (has no administration in time range)  insulin glargine-yfgn (SEMGLEE) injection 35 Units (has no administration in time range)  enoxaparin (LOVENOX) injection 70 mg (has no administration in time range)  acetaminophen (TYLENOL) tablet 650 mg (has no administration in time range)    Or  acetaminophen (TYLENOL) suppository 650 mg (has no administration in time range)  oxyCODONE (Oxy IR/ROXICODONE) immediate release tablet 5 mg (has no administration in time range)  fentaNYL (SUBLIMAZE) injection 25 mcg (has no administration in time range)  senna-docusate (Senokot-S) tablet 2 tablet (  has no administration in time range)  ondansetron (ZOFRAN) tablet 4 mg (has no administration in time range)    Or  ondansetron (ZOFRAN) injection 4 mg (has no administration in time range)  pantoprazole (PROTONIX) EC tablet 40 mg (has no administration in time range)  guaiFENesin (MUCINEX) 12 hr tablet 1,200 mg (has no administration in time range)  ipratropium-albuterol (DUONEB) 0.5-2.5 (3) MG/3ML nebulizer solution 3 mL (has no administration in time range)  cefTRIAXone (ROCEPHIN) 1 g in sodium chloride 0.9 % 100 mL IVPB (has no administration in time range)  azithromycin (ZITHROMAX) tablet 500 mg (has no administration in time range)  methylPREDNISolone sodium succinate (SOLU-MEDROL) 40 mg/mL injection 40 mg (has no administration in time range)  dextromethorphan (DELSYM) 30 MG/5ML liquid 30 mg (has no administration in time range)  albuterol (PROVENTIL) (2.5  MG/3ML) 0.083% nebulizer solution ( Nebulization Given 04/15/23 0831)  albuterol (PROVENTIL) (2.5 MG/3ML) 0.083% nebulizer solution (  Given 04/15/23 0916)  furosemide (LASIX) injection 40 mg (40 mg Intravenous Given 04/15/23 0924)  cefTRIAXone (ROCEPHIN) 1 g in sodium chloride 0.9 % 100 mL IVPB (0 g Intravenous Stopped 04/15/23 1003)    Mobility walks with device     Focused Assessments Pulmonary Assessment Handoff:  Lung sounds: Bilateral Breath Sounds: Clear L Breath Sounds: Diminished R Breath Sounds: Diminished O2 Device: Nasal Cannula O2 Flow Rate (L/min): 4 L/min    R Recommendations: See Admitting Provider Note  Report given to:   Additional Notes: pt on bipap

## 2023-04-15 NOTE — H&P (Signed)
History and Physical  [facility]  [name] KGM:[id number] DOB: 1966/06/21 DOA: 04/15/2023  PCP: [facility]  Patient coming from: Home by [facility]  Level of care: Stepdown  I have personally briefly reviewed patient's old medical records in [facility]  Chief Complaint: SOB   HPI: [name] is a 57 year old female with bipolar disorder, history of cognitive impairment, seizure disorder, OSA/OHS, uncontrolled type 2 diabetes mellitus, chronic respiratory failure on supplemental oxygen, stage IIIa CKD, grade 1 diastolic heart failure, morbid obesity, hyperlipidemia presented to the ED by [facility] with acute on chronic respiratory failure.  She was found to be 78% on room air oxygen.  She was having acute respiratory distress symptoms.  Her family called EMS.  She was given IV Solu-Medrol en route to ED.   Pt had just been discharged from [facility] on 04/05/23 with an admission for acute on chronic respiratory failure with hypoxia and hypercarbia.  She was treated with IV furosemide. She reported never smoking or using tobacco. Also was treated with steroids.  She was evaluated by TTS for acute psychosis and was psych cleared to DC home. She was started on zyprexa 10 mg at bedtime  and oxcarbazepine 300 mg BID with outpatient follow up [facility] behavioral health provider.    Pt found to have a pCO2 of 74 and pH of 7.29 and was started on bipap therapy and hospital admission was requested.      Past Medical History:  Diagnosis Date   Agitation 07/28/2022   Anxiety    COPD (chronic obstructive pulmonary disease) (HCC)    Diabetes mellitus    Fear of    falling   Hemophilia (HCC)    Hypertension    Panic attacks    Parkinson's disease    Seizures (HCC)     Past Surgical History:  Procedure Laterality Date   ABDOMINAL HYSTERECTOMY     OOPHORECTOMY     SHOULDER CLOSED REDUCTION Right 04/22/2021   Procedure: CLOSED REDUCTION  SHOULDER;  Surgeon: Vickki Hearing, MD;  Location: [facility]S;  Service: Orthopedics;  Laterality: Right;   SHOULDER CLOSED REDUCTION Right 04/25/2021   Procedure: CLOSED REDUCTION SHOULDER;  Surgeon: Vickki Hearing, MD;  Location: [facility];  Service: Orthopedics;  Laterality: Right;     reports that she has never smoked. She has never used smokeless tobacco. She reports that she does not drink alcohol and does not use drugs.  Allergies  Allergen Reactions   Bee Venom Hives    History reviewed. No pertinent family history.  Prior to Admission medications   Medication Sig Start Date End Date Taking? Authorizing Provider  aspirin EC 81 MG tablet Take 81 mg by mouth every morning.    [provider]  insulin aspart (NOVOLOG) 100 UNIT/ML injection CBG < 70: implement hypoglycemia protocol CBG 70 - 120: 0 units CBG 121 - 150: 3 units CBG 151 - 200: 4 units CBG 201 - 250: 7 units CBG 251 - 300: 11 units CBG 301 - 350: 15 units CBG 351 - 400: 20 units Patient taking differently: Inject 3-20 Units into the skin 3 (three) times daily with meals. CBG < 70: implement hypoglycemia protocol CBG 70 - 120: 0 units CBG 121 - 150: 3 units      CBG 151 - 200: 4 units CBG 201 - 250: 7 units CBG 251 - 300: 11 units CBG 301 - 350: 15 units CBG  351 - 400: 20 units 04/20/14   Kathlen Mody, MD  isosorbide mononitrate (IMDUR) 30 MG 24 hr tablet Take 1 tablet (30 mg total) by mouth daily. 07/08/22   Terrilee Files, MD  LANTUS SOLOSTAR 100 UNIT/ML Solostar Pen Inject 45 Units into the skin 2 (two) times daily. 02/13/23   [provider]  lisinopril (ZESTRIL) 2.5 MG tablet Take 2.5 mg by mouth daily. 02/03/23   [provider]  loratadine (CLARITIN) 10 MG tablet Take 10 mg by mouth daily.    [provider]  metFORMIN (GLUCOPHAGE) 500 MG tablet Take 1 tablet (500 mg total) by mouth 2 (two) times daily with a meal. 07/08/22   Terrilee Files, MD  MYRBETRIQ 25 MG TB24  tablet Take 25 mg by mouth daily. 02/03/23   [provider]  OLANZapine (ZYPREXA) 10 MG tablet Take 1 tablet (10 mg total) by mouth at bedtime. 04/05/23   Catarina Hartshorn, MD  Oxcarbazepine (TRILEPTAL) 300 MG tablet Take 1 tablet (300 mg total) by mouth 2 (two) times daily. 04/05/23   Catarina Hartshorn, MD  propranolol (INDERAL) 60 MG tablet Take 1 tablet (60 mg total) by mouth daily. On an empty stomach 07/08/22   Terrilee Files, MD  rosuvastatin (CRESTOR) 20 MG tablet Take 20 mg by mouth daily. 02/03/23   [provider]    Physical Exam: Vitals:   04/15/23 0810 04/15/23 0811 04/15/23 0821 04/15/23 0930  BP:  (!) 149/87  136/89  Pulse:  83  83  Resp:  18    Temp:  98.4 F (36.9 C)    TempSrc:  Oral    SpO2: 93% 93%  100%  Weight:   (!) 148.8 kg   Height:   5\' 7"  (1.702 m)     Constitutional: Pt awake on bipap, appears chronically ill, moderately distressed.  Eyes: PERRL, lids and conjunctivae normal ENMT: Mucous membranes are moist. Posterior pharynx clear of any exudate or lesions.  Neck: normal, supple, no masses, no thyromegaly Respiratory: poor air movement bilateral, expiratory wheeze left lower lobe  Cardiovascular: normal s1, s2 sounds, no murmurs / rubs / gallops. No extremity edema. 2+ pedal pulses. No carotid bruits.  Abdomen: morbidly obese, no tenderness, no masses palpated. No hepatosplenomegaly. Bowel sounds positive.  Musculoskeletal: no clubbing / cyanosis. No joint deformity upper and lower extremities. Good ROM, no contractures. Normal muscle tone.  Skin: no rashes, lesions, ulcers. No induration Neurologic: CN 2-12 grossly intact. Sensation intact, DTR normal. Strength 5/5 in all 4.  Psychiatric: UTD judgment and insight. Alert and oriented x 3. Normal mood.   Labs on Admission: I have personally reviewed following labs and imaging studies  CBC: Recent Labs  Lab 04/15/23 0800 04/15/23 0809  WBC 7.0  --   NEUTROABS 4.7  --   HGB 11.8* 12.9  HCT  40.0 38.0  MCV 95.2  --   PLT 235  --    Basic Metabolic Panel: Recent Labs  Lab 04/15/23 0800 04/15/23 0809  NA 138 139  K 4.6 4.7  CL 102 103  CO2 29  --   GLUCOSE 202* 197*  BUN 45* 40*  CREATININE 1.49* 1.60*  CALCIUM 8.7*  --   MG 2.0  --    GFR: Estimated Creatinine Clearance: 58.4 mL/min (A) (by C-G formula based on SCr of 1.6 mg/dL (H)). Liver Function Tests: Recent Labs  Lab 04/15/23 0800  AST 20  ALT 33  ALKPHOS 106  BILITOT 1.0  PROT  6.4*  ALBUMIN 3.2*   No results for input(s): "LIPASE", "AMYLASE" in the last 168 hours. No results for input(s): "AMMONIA" in the last 168 hours. Coagulation Profile: No results for input(s): "INR", "PROTIME" in the last 168 hours. Cardiac Enzymes: No results for input(s): "CKTOTAL", "CKMB", "CKMBINDEX", "TROPONINI" in the last 168 hours. BNP (last 3 results) No results for input(s): "PROBNP" in the last 8760 hours. HbA1C: No results for input(s): "HGBA1C" in the last 72 hours. CBG: Recent Labs  Lab 04/15/23 0804  GLUCAP 197*   Lipid Profile: No results for input(s): "CHOL", "HDL", "LDLCALC", "TRIG", "CHOLHDL", "LDLDIRECT" in the last 72 hours. Thyroid Function Tests: No results for input(s): "TSH", "T4TOTAL", "FREET4", "T3FREE", "THYROIDAB" in the last 72 hours. Anemia Panel: No results for input(s): "VITAMINB12", "FOLATE", "FERRITIN", "TIBC", "IRON", "RETICCTPCT" in the last 72 hours. Urine analysis:    Component Value Date/Time   COLORURINE STRAW (A) 04/02/2023 1230   APPEARANCEUR CLEAR 04/02/2023 1230   LABSPEC 1.009 04/02/2023 1230   PHURINE 5.0 04/02/2023 1230   GLUCOSEU NEGATIVE 04/02/2023 1230   HGBUR SMALL (A) 04/02/2023 1230   BILIRUBINUR NEGATIVE 04/02/2023 1230   KETONESUR NEGATIVE 04/02/2023 1230   PROTEINUR 30 (A) 04/02/2023 1230   UROBILINOGEN 0.2 04/11/2014 1927   NITRITE NEGATIVE 04/02/2023 1230   LEUKOCYTESUR MODERATE (A) 04/02/2023 1230    Radiological Exams on Admission: DG Chest Port  1 View  Result Date: 04/15/2023 CLINICAL DATA:  dyspnea EXAM: PORTABLE CHEST - 1 VIEW COMPARISON:  04/04/2023 FINDINGS: Relatively low lung volumes with somewhat coarse bronchovascular markings. Some scattered of the lower opacities in the left upper lobe are new since previous. Heart size upper limits normal. Aortic Atherosclerosis (ICD10-170.0). No effusion. Visualized bones unremarkable. IMPRESSION: Low lung volumes with new left upper lobe opacities suggesting infiltrate. Electronically Signed   By: Corlis Leak M.D.   On: 04/15/2023 08:23    EKG: Independently reviewed. NSR, RBBB  Assessment/Plan Principal Problem:   Acute respiratory failure with hypoxia and hypercarbia (HCC) Active Problems:   Bipolar disorder (HCC)   Uncontrolled type 2 diabetes mellitus with hyperglycemia, with long-term current use of insulin (HCC)   Seizure disorder (HCC)   Essential hypertension   Morbid obesity with BMI of 50.0-59.9, adult (HCC)   Bipolar disorder, current episode manic severe with psychotic features (HCC)   Elevated brain natriuretic peptide (BNP) level   COPD (chronic obstructive pulmonary disease) (HCC)   Mixed hyperlipidemia   Stage 3a chronic kidney disease (CKD) (HCC)   Acute on chronic respiratory failure with hypoxia and hypercarbia Respiratory acidosis -Patient presenting with high pCO2 of 74 -Continue BiPAP therapy as ordered in the stepdown ICU -continue antibiotics and IV steroids for now -continue IV furosemide for volume overload -wean back to baseline home oxygen as able   Bipolar Disorder -recently seen by TTS and medications adjusted  -resume oxcarbazepine 300 mg bid and olanzapine 10 mg qhs   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 - uncontrolled as evidenced by an A1c of 10.3% on 02/26/23 - resume semglee 35 units twice daily with novolog 14 units TID with meals and resistant SSI coverage and frequent CBG monitoring   Essential  Hypertension -resume home isosorbide mononitrate and propanolol  Hyperlipidemia  - resume home rosuvastatin 20 mg daily   CKD stage 3 a  - monitoring BMP while on IV furosemide - hopefully can resume lisinopril 2.5 mg daily in AM if renal function is stable   Acute HFpEF - grade 1 DD  -  IV furosemide ordered - monitor intake output - monitor daily weights   DVT prophylaxis: enoxaparin   Code Status: Full   Family Communication:   Disposition Plan: TBD   Consults called:   Admission status: INP   Level of care: Stepdown Standley Dakins MD [facility] How to contact the [facility] Attending or Consulting provider 7A - 7P or covering provider during after hours 7P -7A, for this patient?  Check the care team in [facility] and look for a) attending/consulting TRH provider listed and b) the [facility] team listed Log into [email or web address] and use [facility] universal password to access. If you do not have the password, please contact the hospital operator. Locate the [facility] provider you are looking for under [facility] and page to a number that you can be directly reached. If you still have difficulty reaching the provider, please page the [facility] (Director on Call) for the Hospitalists listed on amion for assistance.   If 7PM-7AM, please contact night-coverage [email or web address] Password [facility]  04/15/2023, 10:24 AM

## 2023-04-15 NOTE — ED Notes (Signed)
Placed patient on Bipap in PS to begin with. I watched patient for a bit on that mode and decided to put her in [facility] mode with a set rate. She is having periods of apnea. She is comfortable and resting on the machine right now. All parameters are within normal limits.

## 2023-04-15 NOTE — ED Notes (Signed)
Critical lab result PCO2 74. MD notified and bipap ordered. Respiratory notified.

## 2023-04-16 ENCOUNTER — Encounter (HOSPITAL_COMMUNITY): Payer: Self-pay | Admitting: Family Medicine

## 2023-04-16 DIAGNOSIS — F319 Bipolar disorder, unspecified: Secondary | ICD-10-CM | POA: Diagnosis not present

## 2023-04-16 DIAGNOSIS — R7989 Other specified abnormal findings of blood chemistry: Secondary | ICD-10-CM | POA: Diagnosis not present

## 2023-04-16 DIAGNOSIS — F312 Bipolar disorder, current episode manic severe with psychotic features: Secondary | ICD-10-CM | POA: Diagnosis not present

## 2023-04-16 DIAGNOSIS — J9601 Acute respiratory failure with hypoxia: Secondary | ICD-10-CM | POA: Diagnosis not present

## 2023-04-16 LAB — BASIC METABOLIC PANEL
Anion gap: 9 (ref 5–15)
BUN: 51 mg/dL — ABNORMAL HIGH (ref 6–20)
CO2: 27 mmol/L (ref 22–32)
Calcium: 8.3 mg/dL — ABNORMAL LOW (ref 8.9–10.3)
Chloride: 102 mmol/L (ref 98–111)
Creatinine, Ser: 1.5 mg/dL — ABNORMAL HIGH (ref 0.44–1.00)
GFR, Estimated: 40 mL/min — ABNORMAL LOW (ref >60)
Glucose, Bld: 231 mg/dL — ABNORMAL HIGH (ref 70–99)
Potassium: 4.9 mmol/L (ref 3.5–5.1)
Sodium: 138 mmol/L (ref 135–145)

## 2023-04-16 LAB — GLUCOSE, CAPILLARY
Glucose-Capillary: 242 mg/dL — ABNORMAL HIGH (ref 70–99)
Glucose-Capillary: 261 mg/dL — ABNORMAL HIGH (ref 70–99)
Glucose-Capillary: 188 mg/dL — ABNORMAL HIGH (ref 70–99)
Glucose-Capillary: 254 mg/dL — ABNORMAL HIGH (ref 70–99)

## 2023-04-16 LAB — MAGNESIUM: Magnesium: 2.1 mg/dL (ref 1.7–2.4)

## 2023-04-16 LAB — HCG, QUANTITATIVE, PREGNANCY: hCG, Beta Chain, Quant, S: 4 m[IU]/mL (ref <5)

## 2023-04-16 MED ORDER — FUROSEMIDE 10 MG/ML IJ SOLN
40.0000 mg | Freq: Once | INTRAMUSCULAR | Status: AC
  Administered 2023-04-16: 40 mg via INTRAVENOUS
  Filled 2023-04-16: qty 4

## 2023-04-16 MED ORDER — INSULIN ASPART 100 UNIT/ML IJ SOLN
14.0000 [IU] | Freq: Three times a day (TID) | INTRAMUSCULAR | Status: DC
  Administered 2023-04-16 – 2023-04-17 (×2): 14 [IU] via SUBCUTANEOUS

## 2023-04-16 MED ORDER — INSULIN GLARGINE-YFGN 100 UNIT/ML ~~LOC~~ SOLN
45.0000 [IU] | Freq: Two times a day (BID) | SUBCUTANEOUS | Status: DC
  Administered 2023-04-16 – 2023-04-18 (×3): 45 [IU] via SUBCUTANEOUS
  Filled 2023-04-16 (×7): qty 0.45

## 2023-04-16 NOTE — TOC Initial Note (Signed)
Transition of Care [facility]) - Initial/Assessment Note    Patient Details  Name: [name] MRN: [id number] Date of Birth: [birth year]  Transition [facility]) CM/SW Contact:    Elliot Gault, LCSW Phone Number: 04/16/2023, 4:15 PM  Clinical Narrative:                  Pt readmitted from home. Pt high risk for readmission. Received TOC consult for dc planning needs. Per MD, pt in need of CPAP/Bipap at dc. Pt currently not fully oriented. Spoke with pt's daughter to assess.  Pt's daughter reports that pt resides with her husband at home and will return home at dc. Pt has home O2. She received a CPAP in October but it has been misplaced. Discussed option of Bipap/NIV instead and daughter agreeable. CMS provider options reviewed and referred to [facility] with dtr consent. [employer] can also change out the O2 from current provider to their company for consistency. Daughter agreeable to this.  Pt active with [facility] for HHPT and this will be re-ordered at dc.   MD anticipating possible dc tomorrow. TOC will follow up in AM for further assistance with dc planning.  Expected Discharge Plan: Home w Home Health Services Barriers to Discharge: Continued Medical Work up   Patient Goals and CMS Choice Patient states their goals for this hospitalization and ongoing recovery are:: return home CMS Medicare.gov Compare Post Acute Care list provided to:: Patient Represenative (must comment) Choice offered to / list presented to : Adult Children      Expected Discharge Plan and Services In-house Referral: Clinical Social Work   Post Acute Care Choice: Durable Medical Equipment, Resumption of Svcs/PTA Provider Living arrangements for the past 2 months: Single Family Home                 DME Arranged: NIV DME Agency: [employer] Date DME Agency Contacted: 04/16/23   Representative spoke with at DME Agency: Ashly            Prior Living Arrangements/Services Living arrangements for the past 2  months: Single Family Home Lives with:: Spouse, Self Patient language and need for interpreter reviewed:: Yes Do you feel safe going back to the place where you live?: Yes      Need for Family Participation in Patient Care: Yes (Comment) Care giver support system in place?: Yes (comment) Current home services: Home PT Criminal Activity/Legal Involvement Pertinent to Current Situation/Hospitalization: No - Comment as needed  Activities of Daily Living Home Assistive Devices/Equipment: Walker (specify type) ADL Screening (condition at time of admission) Patient's cognitive ability adequate to safely complete daily activities?: Yes Is the patient deaf or have difficulty hearing?: No Does the patient have difficulty seeing, even when wearing glasses/contacts?: No Does the patient have difficulty concentrating, remembering, or making decisions?: Yes Patient able to express need for assistance with ADLs?: Yes Does the patient have difficulty dressing or bathing?: Yes Independently performs ADLs?: No Communication: Independent Dressing (OT): Needs assistance Is this a change from baseline?: Pre-admission baseline Grooming: Needs assistance Is this a change from baseline?: Pre-admission baseline Feeding: Appropriate for developmental age Is this a change from baseline?: Pre-admission baseline Bathing: Needs assistance Is this a change from baseline?: Pre-admission baseline Toileting: Needs assistance Is this a change from baseline?: Pre-admission baseline In/Out Bed: Needs assistance Is this a change from baseline?: Pre-admission baseline Walks in Home: Needs assistance Is this a change from baseline?: Pre-admission baseline Does the patient have difficulty walking or climbing stairs?: Yes  Weakness of Legs: Both Weakness of Arms/Hands: Both  Permission Sought/Granted Permission sought to share information with : Facility Industrial/product designer granted to share information  with : Yes, Verbal Permission Granted     Permission granted to share info w AGENCY: [employer]        Emotional Assessment   Attitude/Demeanor/Rapport: Unable to Assess Affect (typically observed): Unable to Assess Orientation: : Oriented to Self Alcohol / Substance Use: Not Applicable Psych Involvement: No (comment)  Admission diagnosis:  Acute respiratory failure with hypoxia and hypercarbia (HCC) [J96.01, J96.02] Acute on chronic respiratory failure with hypoxia and hypercapnia (HCC) [Z61.09, J96.22] Patient Active Problem List   Diagnosis Date Noted   Acute respiratory failure with hypoxia and hypercarbia (HCC) 04/15/2023   Stage 3a chronic kidney disease (CKD) (HCC) 04/15/2023   Elevated brain natriuretic peptide (BNP) level 03/30/2023   Elevated troponin 03/30/2023   COPD (chronic obstructive pulmonary disease) (HCC) 03/30/2023   Mixed hyperlipidemia 03/30/2023   Acute psychosis (HCC) 02/26/2023   Morbid obesity with BMI of 50.0-59.9, adult (HCC) 02/26/2023   Bipolar disorder, current episode manic severe with psychotic features (HCC) 02/26/2023   Agitation 07/28/2022   Closed fracture dislocation of joint of right shoulder girdle    Traumatic closed displaced fracture of right shoulder with anterior dislocation 04/25/2021   Acute on chronic respiratory failure with hypoxia and hypercapnia (HCC) 04/23/2021   Acute renal failure superimposed on stage 3a chronic kidney disease (HCC) 04/22/2021   Fall    Anterior shoulder dislocation, right, initial encounter    Essential hypertension 04/22/2018   Hypercalcemia 04/22/2018   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 [place] [address] Phone: [phone] Fax: [phone]     Social Determinants of Health (SDOH) Social History: SDOH Screenings   Food Insecurity: No Food Insecurity (04/15/2023)  Housing: Low Risk  (04/15/2023)  Recent Concern: Housing - High Risk (04/01/2023)  Transportation Needs: No Transportation Needs (04/15/2023)  Utilities: Not At Risk (04/15/2023)  Tobacco Use: Low Risk  (04/15/2023)   SDOH Interventions:     Readmission Risk Interventions    04/16/2023    4:15 PM 04/02/2023    1:43 PM 04/25/2021    1:27 PM  Readmission Risk Prevention Plan  Transportation Screening Complete Complete Complete  Home Care Screening   Complete  Medication Review (RN CM)   Complete  HRI or Home Care Consult Complete Complete   Social Work Consult for Recovery Care Planning/Counseling Complete Complete   Palliative Care Screening Not Applicable Not Applicable   Medication Review Oceanographer) Complete Complete

## 2023-04-16 NOTE — Plan of Care (Signed)
  Problem: Coping: Goal: Level of anxiety will decrease Outcome: Progressing   Problem: Nutrition: Goal: Adequate nutrition will be maintained Outcome: Progressing   Problem: Activity: Goal: Risk for activity intolerance will decrease Outcome: Progressing   

## 2023-04-16 NOTE — Plan of Care (Signed)
  Problem: Acute Rehab PT Goals(only PT should resolve) Goal: Pt Will Go Supine/Side To Sit Outcome: Progressing Flowsheets (Taken 04/16/2023 1220) Pt will go Supine/Side to Sit:  with supervision  with modified independence Goal: Patient Will Transfer Sit To/From Stand Outcome: Progressing Flowsheets (Taken 04/16/2023 1220) Patient will transfer sit to/from stand:  with modified independence  with supervision Goal: Pt Will Transfer Bed To Chair/Chair To Bed Outcome: Progressing Flowsheets (Taken 04/16/2023 1220) Pt will Transfer Bed to Chair/Chair to Bed:  with modified independence  with supervision Goal: Pt Will Ambulate Outcome: Progressing Flowsheets (Taken 04/16/2023 1220) Pt will Ambulate:  50 feet  with min guard assist  with supervision  with rolling walker   12:21 PM, 04/16/23 Ocie Bob, MPT Physical Therapist with [facility] [phone] office [phone] mobile phone

## 2023-04-16 NOTE — Progress Notes (Addendum)
PROGRESS NOTE   [name]  ZOX:[id number] DOB: 10-01-1965 DOA: 04/15/2023 PCP: [facility]   Chief Complaint  Patient presents with   Shortness of Breath   Level of care: Stepdown  Brief Admission History:  57 year old female with bipolar disorder, history of cognitive impairment, seizure disorder, OSA/OHS, uncontrolled type 2 diabetes mellitus, chronic respiratory failure on supplemental oxygen, stage IIIa CKD, grade 1 diastolic heart failure, morbid obesity, hyperlipidemia presented to the ED by [facility] with acute on chronic respiratory failure.  She was found to be 78% on room air oxygen.  She was having acute respiratory distress symptoms.  Her family called EMS.  She was given IV Solu-Medrol en route to ED.   Pt had just been discharged from [facility] on 04/05/23 with an admission for acute on chronic respiratory failure with hypoxia and hypercarbia.  She was treated with IV furosemide. She reported never smoking or using tobacco. Also was treated with steroids.  She was evaluated by TTS for acute psychosis and was psych cleared to DC home. She was started on zyprexa 10 mg at bedtime  and oxcarbazepine 300 mg BID with outpatient follow up [facility] behavioral health provider.    Pt found to have a pCO2 of 74 and pH of 7.29 and was started on bipap therapy and hospital admission was requested.     Assessment and Plan:  Acute on chronic respiratory failure with hypoxia and hypercarbia Respiratory acidosis -Patient presented with high pCO2 of 74 -Continue BiPAP therapy as ordered -continue antibiotics and IV steroids for now -continue IV furosemide for volume overload -wean back to baseline home oxygen as able  -mentation improving as pCO2 trends down  Pt has severe chronic respiratory failure due to severe COPD with chronic hypercapnic resp failure. Pt requires frequent durations of respiratory support and deteriorates quickly in the absence  of non-invasive mechanical ventilator. Pt's PC02 was 61 on 40% fiO2. Interruption or failure to provide NIMV would quickly lead to exacerbation of the patient's condition, lead to hospitalization and likely harm the patient. Bilevel device unable to adequately support their nocturnal ventilation needs. Patient requires the use of NIV both QHS and daytime to help with exacerbation periods. The use of the NIV will treat the patient's high PCO2 levels and can reduce risk of exacerbations and future hospitalizations when used at night and during the day. Patient would benefit from NIV therapy with set tidal volumes and pressure support as BIPAP will not be able to accommodate these advanced settings. Continued use of the NIMV is preferred. Patient can clear secretions and protect their airway.   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 - uncontrolled as evidenced by an A1c of 10.3% on 02/26/23 - increase semglee 45 units twice daily with novolog 14 units TID with meals and resistant SSI coverage and frequent CBG  monitoring  CBG (last 3)  Recent Labs    04/15/23 2022 04/16/23 0745 04/16/23 1114  GLUCAP 247* 242* 261*   Essential Hypertension -resumed home isosorbide mononitrate and propanolol   Hyperlipidemia  - resumed home rosuvastatin 20 mg daily    CKD stage 3 a  - monitoring BMP while on IV furosemide -  hopefully can resume lisinopril 2.5 mg daily in AM if renal function is stable    Acute HFpEF - grade 1 DD  - IV furosemide ordered - monitor intake output - monitor daily weights    DVT prophylaxis: enoxaparin   Code Status: Full   Family Communication:   Disposition Plan: TBD   Consults called:   Admission status: INP     Consultants:   Procedures:   Antimicrobials:  Ceftriaxone 7/21>> Azithromycin 7/21>>   Subjective: Pt has disorganized thoughts and pressured speech, telling me that she is pregnant and requesting a pregnancy test Objective: Vitals:   04/16/23 1000 04/16/23 1100 04/16/23 1116 04/16/23 1200  BP: (!) 148/75 (!) 138/116    Pulse: 81 71 69 76  Resp: 18 13 (!) 21 13  Temp:  100 F (37.8 C)    TempSrc:  Oral    SpO2: 96% 93% 100% (!) 89%  Weight:      Height:        Intake/Output Summary (Last 24 hours) at 04/16/2023 1233 Last data filed at 04/16/2023 0900 Gross per 24 hour  Intake 1160 ml  Output 500 ml  Net 660 ml   Filed Weights   04/15/23 0821 04/16/23 0427  Weight: (!) 148.8 kg (!) 142.8 kg   Examination:  General exam: morbidly obese female lying in bed, awake, appears manic, NAD.   Respiratory system: diffuse rales, shallow breathing at times.  Cardiovascular system: normal S1 & S2 heard. No JVD, murmurs, rubs, gallops or clicks. 2+ pedal edema. Gastrointestinal system: Abdomen is nondistended, soft and nontender. No organomegaly or masses felt. Normal bowel sounds heard. Central nervous system: Alert and oriented. No focal neurological deficits. Extremities: Symmetric 5 x 5 power. Skin: No rashes, lesions or ulcers. Psychiatry: Judgement and insight appear poor. Appears manic with pressured speech.   Data Reviewed: I have personally reviewed following labs and imaging studies  CBC: Recent Labs  Lab 04/15/23 0800 04/15/23 0809  WBC 7.0  --   NEUTROABS 4.7  --   HGB 11.8* 12.9  HCT 40.0 38.0  MCV 95.2  --   PLT 235  --      Basic Metabolic Panel: Recent Labs  Lab 04/15/23 0800 04/15/23 0809 04/16/23 0311  NA 138 139 138  K 4.6 4.7 4.9  CL 102 103 102  CO2 29  --  27  GLUCOSE 202* 197* 231*  BUN 45* 40* 51*  CREATININE 1.49* 1.60* 1.50*  CALCIUM 8.7*  --  8.3*  MG 2.0  --  2.1    CBG: Recent Labs  Lab 04/15/23 1140 04/15/23 1638 04/15/23 2022 04/16/23 0745 04/16/23 1114  GLUCAP 182* 416* 247* 242* 261*    Recent Results (from the past 240 hour(s))  Resp panel by RT-PCR (RSV, Flu A&B, Covid) Anterior Nasal Swab     Status: None   Collection Time: 04/15/23  8:00 AM   Specimen: Anterior Nasal Swab  Result Value Ref Range Status   SARS Coronavirus 2 by RT PCR NEGATIVE NEGATIVE Final    Comment: (NOTE) SARS-CoV-2 target nucleic acids are NOT DETECTED.  The SARS-CoV-2 RNA is generally detectable  in upper respiratory specimens during the acute phase of infection. The lowest concentration of SARS-CoV-2 viral copies this assay can detect is 138 copies/mL. A negative result does not preclude SARS-Cov-2 infection and should not be used as the sole basis for treatment or other patient management decisions. A negative result may occur with  improper specimen collection/handling, submission of specimen other than nasopharyngeal swab, presence of viral mutation(s) within the areas targeted by this assay, and inadequate number of viral copies(<138 copies/mL). A negative result must be combined with clinical observations, patient history, and epidemiological information. The expected result is Negative.  Fact Sheet for Patients:  BloggerCourse.com  Fact Sheet for Healthcare Providers:  SeriousBroker.it  This test is no t yet approved or cleared by the [place] FDA and  has been authorized for detection and/or diagnosis of SARS-CoV-2 by FDA under an Emergency Use Authorization (EUA). This EUA will remain  in effect (meaning this test can  be used) for the duration of the COVID-19 declaration under Section 564(b)(1) of the Act, 21 U.S.C.section 360bbb-3(b)(1), unless the authorization is terminated  or revoked sooner.       Influenza A by PCR NEGATIVE NEGATIVE Final   Influenza B by PCR NEGATIVE NEGATIVE Final    Comment: (NOTE) The Xpert Xpress SARS-CoV-2/FLU/RSV plus assay is intended as an aid in the diagnosis of influenza from Nasopharyngeal swab specimens and should not be used as a sole basis for treatment. Nasal washings and aspirates are unacceptable for Xpert Xpress SARS-CoV-2/FLU/RSV testing.  Fact Sheet for Patients: BloggerCourse.com  Fact Sheet for Healthcare Providers: SeriousBroker.it  This test is not yet approved or cleared by the [place] FDA and has been authorized for detection and/or diagnosis of SARS-CoV-2 by FDA under an Emergency Use Authorization (EUA). This EUA will remain in effect (meaning this test can be used) for the duration of the COVID-19 declaration under Section 564(b)(1) of the Act, 21 U.S.C. section 360bbb-3(b)(1), unless the authorization is terminated or revoked.     Resp Syncytial Virus by PCR NEGATIVE NEGATIVE Final    Comment: (NOTE) Fact Sheet for Patients: BloggerCourse.com  Fact Sheet for Healthcare Providers: SeriousBroker.it  This test is not yet approved or cleared by the [place] FDA and has been authorized for detection and/or diagnosis of SARS-CoV-2 by FDA under an Emergency Use Authorization (EUA). This EUA will remain in effect (meaning this test can be used) for the duration of the COVID-19 declaration under Section 564(b)(1) of the Act, 21 U.S.C. section 360bbb-3(b)(1), unless the authorization is terminated or revoked.  Performed at [facility], [address] [place], [place] [address]      Radiology Studies: [facility] 1  View  Result Date: 04/15/2023 CLINICAL DATA:  dyspnea EXAM: PORTABLE CHEST - 1 VIEW COMPARISON:  04/04/2023 FINDINGS: Relatively low lung volumes with somewhat coarse bronchovascular markings. Some scattered of the lower opacities in the left upper lobe are new since previous. Heart size upper limits normal. Aortic Atherosclerosis (ICD10-170.0). No effusion. Visualized bones unremarkable. IMPRESSION: Low lung volumes with new left upper lobe opacities suggesting infiltrate. Electronically Signed   By: Corlis Leak M.D.   On: 04/15/2023 08:23    Scheduled Meds:  aspirin EC  81 mg Oral BH-q7a   azithromycin  500 mg Oral Daily   Chlorhexidine Gluconate Cloth  6 each Topical Q0600   enoxaparin (LOVENOX) injection  70 mg Subcutaneous Q24H   FLUoxetine  20 mg Oral Daily   guaiFENesin  1,200 mg Oral BID  insulin aspart  0-20 Units Subcutaneous TID WC   insulin aspart  0-5 Units Subcutaneous QHS   insulin aspart  12 Units Subcutaneous TID WC   insulin glargine-yfgn  40 Units Subcutaneous BID   ipratropium-albuterol  3 mL Nebulization Q4H   isosorbide mononitrate  30 mg Oral Daily   loratadine  10 mg Oral Daily   methylPREDNISolone (SOLU-MEDROL) injection  40 mg Intravenous Q12H   mirabegron ER  25 mg Oral Daily   mupirocin ointment   Nasal BID   OLANZapine  10 mg Oral QHS   Oxcarbazepine  300 mg Oral BID   pantoprazole  40 mg Oral Q0600   propranolol  60 mg Oral Daily   rosuvastatin  20 mg Oral QPM   senna-docusate  2 tablet Oral QHS   Continuous Infusions:  cefTRIAXone (ROCEPHIN)  IV 1 g (04/16/23 0831)     LOS: 1 day   Time spent: 40 mins  Cruzita Lipa Laural Benes, MD How to contact the [facility] Attending or Consulting provider 7A - 7P or covering provider during after hours 7P -7A, for this patient?  Check the care team in [facility] and look for a) attending/consulting TRH provider listed and b) the [facility] team listed Log into [email or web address] and use [place]'s universal password to access. If you do not  have the password, please contact the hospital operator. Locate the [facility] provider you are looking for under [facility] and page to a number that you can be directly reached. If you still have difficulty reaching the provider, please page the [facility] (Director on Call) for the Hospitalists listed on amion for assistance.  04/16/2023, 12:33 PM

## 2023-04-16 NOTE — Evaluation (Signed)
Physical Therapy Evaluation Patient Details Name: [name] MRN: [id number] DOB: [birth year] Today's Date: 04/16/2023  History of Present Illness  [name] is a 57 year old female with bipolar disorder, history of cognitive impairment, seizure disorder, OSA/OHS, uncontrolled type 2 diabetes mellitus, chronic respiratory failure on supplemental oxygen, stage IIIa CKD, grade 1 diastolic heart failure, morbid obesity, hyperlipidemia presented to the ED by [facility] with acute on chronic respiratory failure.  She was found to be 78% on room air oxygen.  She was having acute respiratory distress symptoms.  Her family called EMS.  She was given IV Solu-Medrol en route to ED.   Clinical Impression  Patient demonstrates good return for getting into/out of bed, able to take a few steps in room and good return for transferring to/from commode.  Patient limited for gait training mostly due to c/o fatigue and tolerated staying up in chair after therapy.  Patient will benefit from continued skilled physical therapy in hospital and recommended venue below to increase strength, balance, endurance for safe ADLs and gait.         Assistance Recommended at Discharge Intermittent Supervision/Assistance  If plan is discharge home, recommend the following:  Can travel by private vehicle  A little help with walking and/or transfers;A little help with bathing/dressing/bathroom;Assistance with cooking/housework;Help with stairs or ramp for entrance        Equipment Recommendations None recommended by PT  Recommendations for Other Services       Functional Status Assessment Patient has had a recent decline in their functional status and demonstrates the ability to make significant improvements in function in a reasonable and predictable amount of time.     Precautions / Restrictions Precautions Precautions: Fall Restrictions Weight Bearing Restrictions: No      Mobility  Bed Mobility Overal bed  mobility: Needs Assistance Bed Mobility: Supine to Sit, Sit to Supine     Supine to sit: Supervision Sit to supine: Supervision   General bed mobility comments: slightly labored movement    Transfers Overall transfer level: Needs assistance Equipment used: Rolling walker (2 wheels) Transfers: Sit to/from Stand, Bed to chair/wheelchair/BSC Sit to Stand: Min guard   Step pivot transfers: Min guard       General transfer comment: poor awareness of IV lines, O2 tubing, etc    Ambulation/Gait Ambulation/Gait assistance: Min guard Gait Distance (Feet): 10 Feet Assistive device: Rolling walker (2 wheels) Gait Pattern/deviations: Decreased step length - right, Decreased step length - left, Decreased stride length, Trunk flexed Gait velocity: decreased     General Gait Details: able to walk in room with slow labored movement without loss of balance using RW  Stairs            Wheelchair Mobility     Tilt Bed    Modified Rankin (Stroke Patients Only)       Balance Overall balance assessment: Needs assistance Sitting-balance support: Feet supported, No upper extremity supported Sitting balance-Leahy Scale: Good Sitting balance - Comments: seated at EOB   Standing balance support: During functional activity, Bilateral upper extremity supported, Reliant on assistive device for balance Standing balance-Leahy Scale: Fair Standing balance comment: fair/good using RW                             Pertinent Vitals/Pain Pain Assessment Pain Assessment: No/denies pain    Home Living Family/patient expects to be discharged to:: Private residence Living Arrangements: Spouse/significant other Available Help at  Discharge: Available PRN/intermittently Type of Home: House Home Access: Ramped entrance       Home Layout: One level Home Equipment: Agricultural consultant (2 wheels) Additional Comments: information per prior admission    Prior Function Prior Level of  Function : Needs assist       Physical Assist : Mobility (physical);ADLs (physical) Mobility (physical): Bed mobility;Transfers;Gait;Stairs   Mobility Comments: household ambulator using RW ADLs Comments: Assisted by family     Hand Dominance        Extremity/Trunk Assessment   Upper Extremity Assessment Upper Extremity Assessment: Overall WFL for tasks assessed    Lower Extremity Assessment Lower Extremity Assessment: Generalized weakness    Cervical / Trunk Assessment Cervical / Trunk Assessment: Normal  Communication   Communication: Expressive difficulties;Other (comment) (appears confused)  Cognition Arousal/Alertness: Awake/alert   Overall Cognitive Status: History of cognitive impairments - at baseline                                          General Comments      Exercises     Assessment/Plan    PT Assessment Patient needs continued PT services  PT Problem List Decreased strength;Decreased activity tolerance;Decreased balance;Decreased mobility       PT Treatment Interventions DME instruction;Gait training;Functional mobility training;Therapeutic activities;Therapeutic exercise;Patient/family education;Balance training    PT Goals (Current goals can be found in the Care Plan section)  Acute Rehab PT Goals Patient Stated Goal: return home with family to assist PT Goal Formulation: With patient Time For Goal Achievement: 04/21/23 Potential to Achieve Goals: Good    Frequency Min 3X/week     Co-evaluation               AM-PAC PT "6 Clicks" Mobility  Outcome Measure Help needed turning from your back to your side while in a flat bed without using bedrails?: None Help needed moving from lying on your back to sitting on the side of a flat bed without using bedrails?: A Little Help needed moving to and from a bed to a chair (including a wheelchair)?: A Little Help needed standing up from a chair using your arms (e.g.,  wheelchair or bedside chair)?: A Little Help needed to walk in hospital room?: A Little Help needed climbing 3-5 steps with a railing? : A Lot 6 Click Score: 18    End of Session Equipment Utilized During Treatment: Oxygen Activity Tolerance: Patient tolerated treatment well;Patient limited by fatigue Patient left: in chair;with call bell/phone within reach Nurse Communication: Mobility status PT Visit Diagnosis: Unsteadiness on feet (R26.81);Other abnormalities of gait and mobility (R26.89);Muscle weakness (generalized) (M62.81)    Time: 1040-1108 PT Time Calculation (min) (ACUTE ONLY): 28 min   Charges:   PT Evaluation $PT Eval Moderate Complexity: 1 Mod PT Treatments $Therapeutic Activity: 23-37 mins PT General Charges $$ ACUTE PT VISIT: 1 Visit         12:19 PM, 04/16/23 Ocie Bob, MPT Physical Therapist with [facility] [phone] office [phone] mobile phone

## 2023-04-17 DIAGNOSIS — J9601 Acute respiratory failure with hypoxia: Secondary | ICD-10-CM | POA: Diagnosis not present

## 2023-04-17 DIAGNOSIS — R7989 Other specified abnormal findings of blood chemistry: Secondary | ICD-10-CM | POA: Diagnosis not present

## 2023-04-17 DIAGNOSIS — F312 Bipolar disorder, current episode manic severe with psychotic features: Secondary | ICD-10-CM | POA: Diagnosis not present

## 2023-04-17 DIAGNOSIS — F319 Bipolar disorder, unspecified: Secondary | ICD-10-CM | POA: Diagnosis not present

## 2023-04-17 LAB — BASIC METABOLIC PANEL
Anion gap: 10 (ref 5–15)
BUN: 51 mg/dL — ABNORMAL HIGH (ref 6–20)
CO2: 32 mmol/L (ref 22–32)
Calcium: 8.9 mg/dL (ref 8.9–10.3)
Chloride: 100 mmol/L (ref 98–111)
Creatinine, Ser: 1.49 mg/dL — ABNORMAL HIGH (ref 0.44–1.00)
GFR, Estimated: 40 mL/min — ABNORMAL LOW (ref >60)
Glucose, Bld: 207 mg/dL — ABNORMAL HIGH (ref 70–99)
Potassium: 4.6 mmol/L (ref 3.5–5.1)
Sodium: 142 mmol/L (ref 135–145)

## 2023-04-17 LAB — GLUCOSE, CAPILLARY
Glucose-Capillary: 207 mg/dL — ABNORMAL HIGH (ref 70–99)
Glucose-Capillary: 385 mg/dL — ABNORMAL HIGH (ref 70–99)
Glucose-Capillary: 83 mg/dL (ref 70–99)
Glucose-Capillary: 231 mg/dL — ABNORMAL HIGH (ref 70–99)

## 2023-04-17 LAB — MAGNESIUM: Magnesium: 2.1 mg/dL (ref 1.7–2.4)

## 2023-04-17 MED ORDER — INSULIN ASPART 100 UNIT/ML IJ SOLN
15.0000 [IU] | Freq: Three times a day (TID) | INTRAMUSCULAR | Status: DC
  Administered 2023-04-17 – 2023-04-18 (×4): 15 [IU] via SUBCUTANEOUS

## 2023-04-17 MED ORDER — FUROSEMIDE 10 MG/ML IJ SOLN
40.0000 mg | Freq: Once | INTRAMUSCULAR | Status: AC
  Administered 2023-04-17: 40 mg via INTRAVENOUS
  Filled 2023-04-17: qty 4

## 2023-04-17 MED ORDER — IPRATROPIUM-ALBUTEROL 0.5-2.5 (3) MG/3ML IN SOLN
3.0000 mL | Freq: Four times a day (QID) | RESPIRATORY_TRACT | Status: DC
  Administered 2023-04-17 – 2023-04-18 (×3): 3 mL via RESPIRATORY_TRACT
  Filled 2023-04-17 (×2): qty 3

## 2023-04-17 NOTE — TOC Progression Note (Signed)
Transition of Care [facility]) - Progression Note    Patient Details  Name: [name] MRN: [id number] Date of Birth: [birth year]  Transition of Care [facility]) CM/SW Contact  Elliot Gault, LCSW Phone Number: 04/17/2023, 1:50 PM  Clinical Narrative:     TOC following. Ashly at [employer] has approved the NIV. [employer] can deliver the NIV and O2 at pt's home after dc. MD anticipating dc tomorrow. Awaiting order for the Home O2. Pt is active with [facility] for HHPT and awaiting resumption order for this as well.  Assigned TOC will follow up in AM.   Expected Discharge Plan: Home w Home Health Services Barriers to Discharge: Continued Medical Work up  Expected Discharge Plan and Services In-house Referral: Clinical Social Work   Post Acute Care Choice: Horticulturist, commercial, Resumption of Svcs/PTA Provider Living arrangements for the past 2 months: Single Family Home                 DME Arranged: NIV DME Agency: Patsy Lager Date DME Agency Contacted: 04/16/23   Representative spoke with at DME Agency: Kathie Rhodes             Social Determinants of Health (SDOH) Interventions SDOH Screenings   Food Insecurity: No Food Insecurity (04/15/2023)  Housing: Low Risk  (04/15/2023)  Recent Concern: Housing - High Risk (04/01/2023)  Transportation Needs: No Transportation Needs (04/15/2023)  Utilities: Not At Risk (04/15/2023)  Tobacco Use: Low Risk  (04/15/2023)    Readmission Risk Interventions    04/16/2023    4:15 PM 04/02/2023    1:43 PM 04/25/2021    1:27 PM  Readmission Risk Prevention Plan  Transportation Screening Complete Complete Complete  Home Care Screening   Complete  Medication Review (RN CM)   Complete  HRI or Home Care Consult Complete Complete   Social Work Consult for Recovery Care Planning/Counseling Complete Complete   Palliative Care Screening Not Applicable Not Applicable   Medication Review Oceanographer) Complete Complete

## 2023-04-17 NOTE — Plan of Care (Signed)

## 2023-04-17 NOTE — Progress Notes (Signed)
PROGRESS NOTE   [name]  WUX:[id number] DOB: September 03, 1966 DOA: 04/15/2023 PCP: [facility]   Chief Complaint  Patient presents with   Shortness of Breath   Level of care: Telemetry  Brief Admission History:  57 year old female with bipolar disorder, history of cognitive impairment, seizure disorder, OSA/OHS, uncontrolled type 2 diabetes mellitus, chronic respiratory failure on supplemental oxygen, stage IIIa CKD, grade 1 diastolic heart failure, morbid obesity, hyperlipidemia presented to the ED by [facility] with acute on chronic respiratory failure.  She was found to be 78% on room air oxygen.  She was having acute respiratory distress symptoms.  Her family called EMS.  She was given IV Solu-Medrol en route to ED.   Pt had just been discharged from [facility] on 04/05/23 with an admission for acute on chronic respiratory failure with hypoxia and hypercarbia.  She was treated with IV furosemide. She reported never smoking or using tobacco. Also was treated with steroids.  She was evaluated by TTS for acute psychosis and was psych cleared to DC home. She was started on zyprexa 10 mg at bedtime  and oxcarbazepine 300 mg BID with outpatient follow up [facility] behavioral health provider.    Pt found to have a pCO2 of 74 and pH of 7.29 and was started on bipap therapy and hospital admission was requested.     Assessment and Plan:  Acute on chronic respiratory failure with hypoxia and hypercarbia Respiratory acidosis -Patient presented with high pCO2 of 74 -Continue BiPAP therapy as ordered -continue antibiotics and IV steroids for now -continue IV furosemide for volume overload -wean back to baseline home oxygen as able  -mentation improving as pCO2 trends down  Pt has severe chronic respiratory failure due to severe COPD with chronic hypercapnic resp failure. Pt requires frequent durations of respiratory support and deteriorates quickly in the  absence of non-invasive mechanical ventilator. Pt's PC02 was 61 on 40% fiO2. Interruption or failure to provide NIMV would quickly lead to exacerbation of the patient's condition, lead to hospitalization and likely harm the patient. Bilevel device unable to adequately support their nocturnal ventilation needs. Patient requires the use of NIV both QHS and daytime to help with exacerbation periods. The use of the NIV will treat the patient's high PCO2 levels and can reduce risk of exacerbations and future hospitalizations when used at night and during the day. Patient would benefit from NIV therapy with set tidal volumes and pressure support as BIPAP will not be able to accommodate these advanced settings. Continued use of the NIMV is preferred. Patient can clear secretions and protect their airway.   OSA/Obesity Hypoventilation Syndrome - Pt reports she lost her CPAP and after discussion agreeable to home NIV therapy - Pt benefits greatly from bipap therapy in hospital utilized nightly - requested TOC to investigate qualification for NIV   Bipolar Disorder -suboptimally controlled (due to poor compliance with treatment medications) -recently seen by TTS and medications adjusted during last recent hospitalization -resume oxcarbazepine 300 mg bid and olanzapine 10 mg qhs  -I question and doubt that she is taking medication regularly at home  -asked social worker to investigate if home situation is safe and if she can return home with supervision   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 - uncontrolled as evidenced by an A1c of 10.3% on 02/26/23 - increased semglee 45 units twice daily with novolog 15 units TID with meals and resistant SSI coverage and frequent CBG monitoring  CBG (last 3)  Recent Labs    04/16/23 1635 04/16/23 2124 04/17/23 0733  GLUCAP 188* 254* 207*   Essential Hypertension -resumed home isosorbide mononitrate and propanolol   Hyperlipidemia  -  resumed home rosuvastatin 20 mg daily    CKD stage 3 a  - monitoring BMP while on IV furosemide - hopefully can resume lisinopril 2.5 mg daily in AM if renal function stabilizes    Acute HFpEF - grade 1 DD  - IV furosemide ordered - monitor intake output - monitor daily weights  - she has diuresed a lot as evidenced by rapid weight decline however I/O records are highly inaccurate   Intake/Output Summary (Last 24 hours) at 04/17/2023 1001 Last data filed at 04/17/2023 0700 Gross per 24 hour  Intake 840 ml  Output 1200 ml  Net -360 ml   Filed Weights   04/15/23 0821 04/16/23 0427  Weight: (!) 148.8 kg (!) 142.8 kg    DVT prophylaxis: enoxaparin   Code Status: Full   Family Communication:   Disposition Plan: TBD   Consults called:   Admission status: INP     Consultants:   Procedures:   Antimicrobials:  Ceftriaxone 7/21>> Azithromycin 7/21>>   Subjective: Pt continues to have disorganized thoughts and pressured speech, she is cooperative, she is not able to tell me how she is breathing but she appears to be more comfortable  Objective: Vitals:   04/17/23 0716 04/17/23 0804 04/17/23 0956 04/17/23 0958  BP:      Pulse:      Resp:      Temp:  98.5 F (36.9 C)    TempSrc:  Oral    SpO2: 93%  (!) 86% (!) 87%  Weight:      Height:        Intake/Output Summary (Last 24 hours) at 04/17/2023 1000 Last data filed at 04/17/2023 0700 Gross per 24 hour  Intake 840 ml  Output 1200 ml  Net -360 ml   Filed Weights   04/15/23 0821 04/16/23 0427  Weight: (!) 148.8 kg (!) 142.8 kg   Examination:  General exam: morbidly obese female lying in bed, awake, appears manic, NAD.   Respiratory system: diffuse expiratory wheezing heard, shallow breathing at times.  Cardiovascular system: normal S1 & S2 heard. No JVD, murmurs, rubs, gallops or clicks. 2+ pedal edema. Gastrointestinal system: Abdomen is nondistended, soft and nontender. No organomegaly or masses felt. Normal bowel  sounds heard. Central nervous system: Alert and oriented. No focal neurological deficits. Extremities: 2+ edema BLEs. Symmetric 5 x 5 power. Skin: No rashes, lesions or ulcers. Psychiatry: Judgement and insight appear poor. Appears manic with pressured speech.   Data Reviewed: I have personally reviewed following labs and imaging studies  CBC: Recent Labs  Lab 04/15/23 0800 04/15/23 0809  WBC 7.0  --   NEUTROABS 4.7  --   HGB 11.8* 12.9  HCT 40.0 38.0  MCV 95.2  --   PLT 235  --     Basic Metabolic Panel: Recent Labs  Lab 04/15/23 0800 04/15/23 0809 04/16/23 0311 04/17/23 0533  NA 138 139 138 142  K 4.6 4.7 4.9 4.6  CL 102 103 102 100  CO2 29  --  27 32  GLUCOSE 202* 197* 231* 207*  BUN 45* 40* 51* 51*  CREATININE 1.49* 1.60* 1.50* 1.49*  CALCIUM 8.7*  --  8.3* 8.9  MG 2.0  --  2.1 2.1    CBG: Recent Labs  Lab 04/16/23 0745  04/16/23 1114 04/16/23 1635 04/16/23 2124 04/17/23 0733  GLUCAP 242* 261* 188* 254* 207*    Recent Results (from the past 240 hour(s))  Resp panel by RT-PCR (RSV, Flu A&B, Covid) Anterior Nasal Swab     Status: None   Collection Time: 04/15/23  8:00 AM   Specimen: Anterior Nasal Swab  Result Value Ref Range Status   SARS Coronavirus 2 by RT PCR NEGATIVE NEGATIVE Final    Comment: (NOTE) SARS-CoV-2 target nucleic acids are NOT DETECTED.  The SARS-CoV-2 RNA is generally detectable in upper respiratory specimens during the acute phase of infection. The lowest concentration of SARS-CoV-2 viral copies this assay can detect is 138 copies/mL. A negative result does not preclude SARS-Cov-2 infection and should not be used as the sole basis for treatment or other patient management decisions. A negative result may occur with  improper specimen collection/handling, submission of specimen other than nasopharyngeal swab, presence of viral mutation(s) within the areas targeted by this assay, and inadequate number of viral copies(<138  copies/mL). A negative result must be combined with clinical observations, patient history, and epidemiological information. The expected result is Negative.  Fact Sheet for Patients:  BloggerCourse.com  Fact Sheet for Healthcare Providers:  SeriousBroker.it  This test is no t yet approved or cleared by the [place] FDA and  has been authorized for detection and/or diagnosis of SARS-CoV-2 by FDA under an Emergency Use Authorization (EUA). This EUA will remain  in effect (meaning this test can be used) for the duration of the COVID-19 declaration under Section 564(b)(1) of the Act, 21 U.S.C.section 360bbb-3(b)(1), unless the authorization is terminated  or revoked sooner.       Influenza A by PCR NEGATIVE NEGATIVE Final   Influenza B by PCR NEGATIVE NEGATIVE Final    Comment: (NOTE) The Xpert Xpress SARS-CoV-2/FLU/RSV plus assay is intended as an aid in the diagnosis of influenza from Nasopharyngeal swab specimens and should not be used as a sole basis for treatment. Nasal washings and aspirates are unacceptable for Xpert Xpress SARS-CoV-2/FLU/RSV testing.  Fact Sheet for Patients: BloggerCourse.com  Fact Sheet for Healthcare Providers: SeriousBroker.it  This test is not yet approved or cleared by the [place] FDA and has been authorized for detection and/or diagnosis of SARS-CoV-2 by FDA under an Emergency Use Authorization (EUA). This EUA will remain in effect (meaning this test can be used) for the duration of the COVID-19 declaration under Section 564(b)(1) of the Act, 21 U.S.C. section 360bbb-3(b)(1), unless the authorization is terminated or revoked.     Resp Syncytial Virus by PCR NEGATIVE NEGATIVE Final    Comment: (NOTE) Fact Sheet for Patients: BloggerCourse.com  Fact Sheet for Healthcare  Providers: SeriousBroker.it  This test is not yet approved or cleared by the [place] FDA and has been authorized for detection and/or diagnosis of SARS-CoV-2 by FDA under an Emergency Use Authorization (EUA). This EUA will remain in effect (meaning this test can be used) for the duration of the COVID-19 declaration under Section 564(b)(1) of the Act, 21 U.S.C. section 360bbb-3(b)(1), unless the authorization is terminated or revoked.  Performed at [facility], [address], [place], [place] [address]      Radiology Studies: No results found.  Scheduled Meds:  aspirin EC  81 mg Oral BH-q7a   azithromycin  500 mg Oral Daily   Chlorhexidine Gluconate Cloth  6 each Topical Q0600   enoxaparin (LOVENOX) injection  70 mg Subcutaneous Q24H   FLUoxetine  20 mg Oral Daily  guaiFENesin  1,200 mg Oral BID   insulin aspart  0-20 Units Subcutaneous TID WC   insulin aspart  0-5 Units Subcutaneous QHS   insulin aspart  14 Units Subcutaneous TID WC   insulin glargine-yfgn  45 Units Subcutaneous BID   ipratropium-albuterol  3 mL Nebulization Q4H   isosorbide mononitrate  30 mg Oral Daily   loratadine  10 mg Oral Daily   methylPREDNISolone (SOLU-MEDROL) injection  40 mg Intravenous Q12H   mirabegron ER  25 mg Oral Daily   mupirocin ointment   Nasal BID   OLANZapine  10 mg Oral QHS   Oxcarbazepine  300 mg Oral BID   pantoprazole  40 mg Oral Q0600   propranolol  60 mg Oral Daily   rosuvastatin  20 mg Oral QPM   senna-docusate  2 tablet Oral QHS   Continuous Infusions:  cefTRIAXone (ROCEPHIN)  IV 1 g (04/17/23 0942)     LOS: 2 days   Time spent: 52 mins  Madhav Mohon Laural Benes, MD How to contact the [facility] Attending or Consulting provider 7A - 7P or covering provider during after hours 7P -7A, for this patient?  Check the care team in [facility] and look for a) attending/consulting TRH provider listed and b) the [facility] team listed Log into [email or web address] and use [facility]'s universal password to access. If you do not have the password, please contact the hospital operator. Locate the [facility] provider you are looking for under [facility] and page to a number that you can be directly reached. If you still have difficulty reaching the provider, please page the [facility] (Director on Call) for the Hospitalists listed on amion for assistance.  04/17/2023, 10:00 AM

## 2023-04-17 NOTE — Progress Notes (Signed)
SATURATION QUALIFICATIONS: (This note is used to comply with regulatory documentation for home oxygen)  Patient Saturations on Room Air at Rest = 86%  Patient Saturations on Room Air while Ambulating = na  Patient Saturations on 4 Liters of oxygen while Ambulating = 94%  Please briefly explain why patient needs home oxygen: Desat while sitting.

## 2023-04-17 NOTE — Progress Notes (Signed)
Physical Therapy Treatment Patient Details Name: [name] MRN: [id number] DOB: 24-Sep-1966 Today's Date: 04/17/2023   History of Present Illness [name] is a 57 year old female with bipolar disorder, history of cognitive impairment, seizure disorder, OSA/OHS, uncontrolled type 2 diabetes mellitus, chronic respiratory failure on supplemental oxygen, stage IIIa CKD, grade 1 diastolic heart failure, morbid obesity, hyperlipidemia presented to the ED by [facility] with acute on chronic respiratory failure.  She was found to be 78% on room air oxygen.  She was having acute respiratory distress symptoms.  Her family called EMS.  She was given IV Solu-Medrol en route to ED.    PT Comments  Patient demonstrates increased endurance/distance for gait training with slightly labored cadence without loss of balance and limited mostly due to c/o fatigue and mild SOB.  Patient tolerated sitting up in chair after therapy with SpO2 at 94% on 2 LPM.  Patient will benefit from continued skilled physical therapy in hospital and recommended venue below to increase strength, balance, endurance for safe ADLs and gait.       Assistance Recommended at Discharge Intermittent Supervision/Assistance  If plan is discharge home, recommend the following:  Can travel by private vehicle    A little help with walking and/or transfers;A little help with bathing/dressing/bathroom;Assistance with cooking/housework;Help with stairs or ramp for entrance      Equipment Recommendations  None recommended by PT    Recommendations for Other Services       Precautions / Restrictions Precautions Precautions: Fall Restrictions Weight Bearing Restrictions: No     Mobility  Bed Mobility Overal bed mobility: Needs Assistance Bed Mobility: Supine to Sit     Supine to sit: Supervision, HOB elevated     General bed mobility comments: required use of bed rail for sitting up at bedside    Transfers Overall transfer  level: Needs assistance Equipment used: Rolling walker (2 wheels) Transfers: Sit to/from Stand, Bed to chair/wheelchair/BSC Sit to Stand: Min guard   Step pivot transfers: Min guard       General transfer comment: unsteady labored movement    Ambulation/Gait Ambulation/Gait assistance: Supervision, Min guard Gait Distance (Feet): 65 Feet Assistive device: Rolling walker (2 wheels) Gait Pattern/deviations: Decreased step length - right, Decreased step length - left, Decreased stride length, Trunk flexed Gait velocity: decreased     General Gait Details: increased endurance/distance for gait training with slow slightly labored cadence without loss of balance, limited mostly due to c/o fatigue, on 2 LPM with SpO2 at 94% after walking   Stairs             Wheelchair Mobility     Tilt Bed    Modified Rankin (Stroke Patients Only)       Balance Overall balance assessment: Needs assistance Sitting-balance support: Feet supported, No upper extremity supported Sitting balance-Leahy Scale: Good Sitting balance - Comments: seated at EOB   Standing balance support: During functional activity, Bilateral upper extremity supported, Reliant on assistive device for balance Standing balance-Leahy Scale: Fair Standing balance comment: fair/good using RW                            Cognition Arousal/Alertness: Awake/alert Behavior During Therapy: WFL for tasks assessed/performed Overall Cognitive Status: History of cognitive impairments - at baseline  Exercises General Exercises - Lower Extremity Long Arc Quad: Seated, AROM, Strengthening, Both, 10 reps Hip Flexion/Marching: Seated, AROM, Strengthening, Both, 10 reps Toe Raises: Seated, AROM, Strengthening, Both, 10 reps Heel Raises: Seated, AROM, Strengthening, Both, 10 reps    General Comments        Pertinent Vitals/Pain Pain Assessment Pain  Assessment: No/denies pain    Home Living                          Prior Function            PT Goals (current goals can now be found in the care plan section) Acute Rehab PT Goals Patient Stated Goal: return home with family to assist PT Goal Formulation: With patient Time For Goal Achievement: 04/21/23 Potential to Achieve Goals: Good Progress towards PT goals: Progressing toward goals    Frequency    Min 3X/week      PT Plan Current plan remains appropriate    Co-evaluation              AM-PAC PT "6 Clicks" Mobility   Outcome Measure  Help needed turning from your back to your side while in a flat bed without using bedrails?: None Help needed moving from lying on your back to sitting on the side of a flat bed without using bedrails?: A Little Help needed moving to and from a bed to a chair (including a wheelchair)?: A Little Help needed standing up from a chair using your arms (e.g., wheelchair or bedside chair)?: A Little Help needed to walk in hospital room?: A Little Help needed climbing 3-5 steps with a railing? : A Lot 6 Click Score: 18    End of Session Equipment Utilized During Treatment: Oxygen Activity Tolerance: Patient tolerated treatment well;Patient limited by fatigue Patient left: in chair;with call bell/phone within reach Nurse Communication: Mobility status PT Visit Diagnosis: Unsteadiness on feet (R26.81);Other abnormalities of gait and mobility (R26.89);Muscle weakness (generalized) (M62.81)     Time: 1610-9604 PT Time Calculation (min) (ACUTE ONLY): 25 min  Charges:    $Gait Training: 8-22 mins $Therapeutic Exercise: 8-22 mins PT General Charges $$ ACUTE PT VISIT: 1 Visit                     1:49 PM, 04/17/23 Ocie Bob, MPT Physical Therapist with [facility] 336 [phone] office [phone] mobile phone

## 2023-04-18 DIAGNOSIS — J9601 Acute respiratory failure with hypoxia: Secondary | ICD-10-CM | POA: Diagnosis not present

## 2023-04-18 DIAGNOSIS — J9602 Acute respiratory failure with hypercapnia: Secondary | ICD-10-CM | POA: Diagnosis not present

## 2023-04-18 LAB — GLUCOSE, CAPILLARY
Glucose-Capillary: 258 mg/dL — ABNORMAL HIGH (ref 70–99)
Glucose-Capillary: 279 mg/dL — ABNORMAL HIGH (ref 70–99)
Glucose-Capillary: 269 mg/dL — ABNORMAL HIGH (ref 70–99)
Glucose-Capillary: 417 mg/dL — ABNORMAL HIGH (ref 70–99)
Glucose-Capillary: 204 mg/dL — ABNORMAL HIGH (ref 70–99)

## 2023-04-18 LAB — CBC
HCT: 40.2 % (ref 36.0–46.0)
Hemoglobin: 11.5 g/dL — ABNORMAL LOW (ref 12.0–15.0)
MCH: 27.7 pg (ref 26.0–34.0)
MCHC: 28.6 g/dL — ABNORMAL LOW (ref 30.0–36.0)
MCV: 96.9 fL (ref 80.0–100.0)
Platelets: 206 10*3/uL (ref 150–400)
RBC: 4.15 MIL/uL (ref 3.87–5.11)
RDW: 15 % (ref 11.5–15.5)
WBC: 7 10*3/uL (ref 4.0–10.5)
nRBC: 0 % (ref 0.0–0.2)

## 2023-04-18 LAB — BASIC METABOLIC PANEL
Anion gap: 8 (ref 5–15)
BUN: 49 mg/dL — ABNORMAL HIGH (ref 6–20)
CO2: 35 mmol/L — ABNORMAL HIGH (ref 22–32)
Calcium: 8.7 mg/dL — ABNORMAL LOW (ref 8.9–10.3)
Chloride: 97 mmol/L — ABNORMAL LOW (ref 98–111)
Creatinine, Ser: 1.41 mg/dL — ABNORMAL HIGH (ref 0.44–1.00)
GFR, Estimated: 43 mL/min — ABNORMAL LOW (ref >60)
Glucose, Bld: 276 mg/dL — ABNORMAL HIGH (ref 70–99)
Potassium: 5.1 mmol/L (ref 3.5–5.1)
Sodium: 140 mmol/L (ref 135–145)

## 2023-04-18 MED ORDER — COMBIVENT RESPIMAT 20-100 MCG/ACT IN AERS: 1.0000 | INHALATION_SPRAY | Freq: Four times a day (QID) | RESPIRATORY_TRACT | 1 refills | Status: DC | PRN

## 2023-04-18 MED ORDER — IPRATROPIUM-ALBUTEROL 0.5-2.5 (3) MG/3ML IN SOLN: 3.0000 mL | Freq: Two times a day (BID) | RESPIRATORY_TRACT | Status: DC

## 2023-04-18 MED ORDER — CEFDINIR 300 MG PO CAPS: 300.0000 mg | ORAL_CAPSULE | Freq: Two times a day (BID) | ORAL | 0 refills | Status: AC

## 2023-04-18 MED ORDER — IPRATROPIUM-ALBUTEROL 0.5-2.5 (3) MG/3ML IN SOLN: 3.0000 mL | Freq: Four times a day (QID) | RESPIRATORY_TRACT | Status: DC | PRN

## 2023-04-18 MED ORDER — INSULIN ASPART 100 UNIT/ML IJ SOLN
25.0000 [IU] | Freq: Once | INTRAMUSCULAR | Status: AC
  Administered 2023-04-18: 25 [IU] via SUBCUTANEOUS

## 2023-04-18 MED ORDER — PREDNISONE 10 MG PO TABS: 40.0000 mg | ORAL_TABLET | Freq: Every day | ORAL | 0 refills | Status: AC

## 2023-04-18 NOTE — TOC Transition Note (Signed)
Transition of Care [facility]) - CM/SW Discharge Note   Patient Details  Name: [name] MRN: [id number] Date of Birth: 1966-03-07  Transition of Care [facility]) CM/SW Contact:  Villa Herb, LCSWA Phone Number: 04/18/2023, 11:31 AM   Clinical Narrative:    CSW updated that pt is medically ready for D/C home today. CSW spoke to [place] with [facility] who confirms they will deliver NIV and home O2 today for pt after D/C. CSW confirmed tank is in room for pt to return home with. CSW updated RN that tank will go with pt. CSW updated Morrie Sheldon with [facility] that orders have been placed and pt will D/C today. TOC signing off.   Final next level of care: Home w Home Health Services Barriers to Discharge: Barriers Resolved   Patient Goals and CMS Choice CMS Medicare.gov Compare Post Acute Care list provided to:: Patient Represenative (must comment) Choice offered to / list presented to : Adult Children, Spouse  Discharge Placement                         Discharge Plan and Services Additional resources added to the After Visit Summary for   In-house Referral: Clinical Social Work   Post Acute Care Choice: Durable Medical Equipment, Resumption of Svcs/PTA Provider          DME Arranged: NIV, Oxygen DME Agency: Patsy Lager Date DME Agency Contacted: 04/18/23   Representative spoke with at DME Agency: Kathie Rhodes HH Arranged: PT HH Agency: [facility] Date HH Agency Contacted: 04/18/23   Representative spoke with at [facility] Agency: Morrie Sheldon  Social Determinants of Health (SDOH) Interventions SDOH Screenings   Food Insecurity: No Food Insecurity (04/15/2023)  Housing: Low Risk  (04/15/2023)  Recent Concern: Housing - High Risk (04/01/2023)  Transportation Needs: No Transportation Needs (04/15/2023)  Utilities: Not At Risk (04/15/2023)  Tobacco Use: Low Risk  (04/15/2023)     Readmission Risk Interventions    04/16/2023    4:15 PM 04/02/2023    1:43 PM 04/25/2021    1:27 PM   Readmission Risk Prevention Plan  Transportation Screening Complete Complete Complete  Home Care Screening   Complete  Medication Review (RN CM)   Complete  HRI or Home Care Consult Complete Complete   Social Work Consult for Recovery Care Planning/Counseling Complete Complete   Palliative Care Screening Not Applicable Not Applicable   Medication Review Oceanographer) Complete Complete

## 2023-04-18 NOTE — Inpatient Diabetes Management (Signed)
Inpatient Diabetes Program Recommendations  AACE/ADA: New Consensus Statement on Inpatient Glycemic Control   Target Ranges:  Prepandial:   less than 140 mg/dL      Peak postprandial:   less than 180 mg/dL (1-2 hours)      Critically ill patients:  140 - 180 mg/dL    Latest Reference Range & Units 04/18/23 02:25 04/18/23 04:57 04/18/23 07:24  Glucose-Capillary 70 - 99 mg/dL 213 (H) 086 (H) 578 (H)    Latest Reference Range & Units 04/17/23 07:33 04/17/23 11:28 04/17/23 16:23 04/17/23 20:39  Glucose-Capillary 70 - 99 mg/dL 469 (H)  Novolog 21 units 385 (H)  Novolog 35 units 83  Novolog 15 units 231 (H)  Novolog 2 units  Semglee 45 units   Review of Glycemic Control  Diabetes history: DM2 Outpatient Diabetes medications: Novolog 0-20 units QID, Lantus 55 units BID, Metformin 500 mg BID (not taking) Current orders for Inpatient glycemic control: Semglee 45 units BID, Novolog 15 units TID, Novolog 0-20 units TID with meals, Novolog 0-5 units at bedtime; Solumedrol 40 mg Q12H  Inpatient Diabetes Program Recommendations:    Insulin: In reviewing chart, noted patient did NOT receive morning dose of Semglee on 04/17/23 (per MAR, medication was not available).  As a result, CBGs have been consistently elevated. If steroids are continued as ordered, please consider increasing meal coverage to Novolog 20 units TID with meals.  Thanks, Orlando Penner, RN, MSN, CDCES Diabetes Coordinator Inpatient Diabetes Program [phone] (Team Pager from 8am to 5pm)

## 2023-04-18 NOTE — Progress Notes (Signed)
Patient discharged home with instructions given on medications and follow up visits ,patient and family verbalized understanding .Prescriptions sent to Pharmacy of choice documented in AVS. IV discontinued, catheter intact. Accompanied by staff to an awaiting vehicle.

## 2023-04-18 NOTE — Progress Notes (Signed)
Mobility Specialist Progress Note:    04/18/23 1145  Mobility  Activity Ambulated with assistance in room  Level of Assistance Standby assist, set-up cues, supervision of patient - no hands on  Assistive Device Front wheel walker  Distance Ambulated (ft) 32 ft  Range of Motion/Exercises All extremities;Active  Activity Response Tolerated well  Mobility Referral Yes  $Mobility charge 1 Mobility  Mobility Specialist Start Time (ACUTE ONLY) 1145  Mobility Specialist Stop Time (ACUTE ONLY) 1155  Mobility Specialist Time Calculation (min) (ACUTE ONLY) 10 min   Pt received in chair. Ambulated to door and back x2 with SBA and RW. Tolerated well, asx throughout. Returned pt to chair, all needs met. Call bell in reach.  Feliciana Rossetti Mobility Specialist Please contact via Special educational needs teacher or  Rehab office at [phone]

## 2023-04-18 NOTE — Plan of Care (Signed)

## 2023-04-18 NOTE — Discharge Summary (Signed)
Physician Discharge Summary  [name]:[id number] DOB: 1966/02/10 DOA: 04/15/2023  PCP: [facility]  Admit date: 04/15/2023  Discharge date: 04/18/2023  Admitted From:Home  Disposition:  Home  Recommendations for Outpatient Follow-up:  Follow up with PCP in 1-2 weeks Continue on prednisone and cefdinir as prescribed for 3 more days to complete course of treatment Combivent prescribed as needed for shortness of breath or wheezing Patient will be discharged with oxygen as well as NIV Continue other home medications as prior  Home Health: Yes with PT  Equipment/Devices: Home NIV, 4 L nasal cannula oxygen  Discharge Condition:Stable  CODE STATUS: Full  Diet recommendation: Heart Healthy/carb modified  Brief/Interim Summary: 57 year old female with bipolar disorder, history of cognitive impairment, seizure disorder, OSA/OHS, uncontrolled type 2 diabetes mellitus, chronic respiratory failure on supplemental oxygen, stage IIIa CKD, grade 1 diastolic heart failure, morbid obesity, hyperlipidemia presented to the ED by [facility] with acute on chronic respiratory failure.  She was found to be 78% on room air oxygen.  She was having acute respiratory distress symptoms.  Her family called EMS.  She was given IV Solu-Medrol en route to ED.    Pt had just been discharged from [facility] on 04/05/23 with an admission for acute on chronic respiratory failure with hypoxia and hypercarbia.  She was treated with IV furosemide. She reported never smoking or using tobacco. Also was treated with steroids.  She was evaluated by TTS for acute psychosis and was psych cleared to DC home. She was started on zyprexa 10 mg at bedtime  and oxcarbazepine 300 mg BID with outpatient follow up [facility] behavioral health provider.     Pt found to have a pCO2 of 74 and pH of 7.29 and was started on bipap therapy and hospital admission was requested.    She was treated for  severe COPD exacerbation with IV Solu-Medrol and breathing treatments as well as IV antibiotics.  She was also noted to have OSA with obesity hypoventilation syndrome and apparently lost her home CPAP.  She has frequent rehospitalization's due to need for NIV at home which has now been arranged and she would benefit from use daily as well as nightly.  She will continue to remain on prednisone and antibiotics as prescribed and is now in stable condition for discharge.  Discharge Diagnoses:  Principal Problem:   Acute respiratory failure with hypoxia and hypercarbia (HCC) Active Problems:   Bipolar disorder (HCC)   Uncontrolled type 2 diabetes mellitus with hyperglycemia, with long-term current use of insulin (HCC)   Seizure disorder (HCC)   Essential hypertension   Morbid obesity with BMI of 50.0-59.9, adult (HCC)   Bipolar disorder, current episode manic severe with psychotic features (HCC)   Elevated brain natriuretic peptide (BNP) level   COPD (chronic obstructive pulmonary disease) (HCC)   Mixed hyperlipidemia   Stage 3a chronic kidney disease (CKD) (HCC)  Principal discharge diagnosis: Acute on chronic hypoxemic and hypercarbic respiratory failure secondary to acute COPD exacerbation in the setting of OSA/obesity hypoventilation syndrome.  Discharge Instructions  Discharge Instructions     Diet - low sodium heart healthy   Complete by: As directed    Increase activity slowly   Complete by: As directed       Allergies as of 04/18/2023       Reactions   Bee Venom Hives        Medication List     TAKE these medications  acetaminophen 500 MG tablet Commonly known as: TYLENOL Take 1,000 mg by mouth every 6 (six) hours as needed for headache.   aspirin EC 81 MG tablet Take 81 mg by mouth daily.   cefdinir 300 MG capsule Commonly known as: OMNICEF Take 1 capsule (300 mg total) by mouth 2 (two) times daily for 3 days.   Combivent Respimat 20-100 MCG/ACT Aers  respimat Generic drug: Ipratropium-Albuterol Inhale 1 puff into the lungs every 6 (six) hours as needed for wheezing or shortness of breath.   FLUoxetine 20 MG capsule Commonly known as: PROZAC Take 20 mg by mouth daily.   insulin aspart 100 UNIT/ML injection Commonly known as: novoLOG CBG < 70: implement hypoglycemia protocol CBG 70 - 120: 0 units CBG 121 - 150: 3 units CBG 151 - 200: 4 units CBG 201 - 250: 7 units CBG 251 - 300: 11 units CBG 301 - 350: 15 units CBG 351 - 400: 20 units What changed:  how much to take how to take this when to take this additional instructions   isosorbide mononitrate 30 MG 24 hr tablet Commonly known as: IMDUR Take 1 tablet (30 mg total) by mouth daily.   Lantus SoloStar 100 UNIT/ML Solostar Pen Generic drug: insulin glargine Inject 55 Units into the skin 2 (two) times daily.   lisinopril 2.5 MG tablet Commonly known as: ZESTRIL Take 2.5 mg by mouth daily.   loratadine 10 MG tablet Commonly known as: CLARITIN Take 10 mg by mouth daily.   metFORMIN 500 MG tablet Commonly known as: GLUCOPHAGE Take 1 tablet (500 mg total) by mouth 2 (two) times daily with a meal.   Myrbetriq 25 MG Tb24 tablet Generic drug: mirabegron ER Take 25 mg by mouth daily.   naproxen sodium 220 MG tablet Commonly known as: ALEVE Take 220 mg by mouth daily as needed (headaches).   OLANZapine 10 MG tablet Commonly known as: ZYPREXA Take 1 tablet (10 mg total) by mouth at bedtime.   Oxcarbazepine 300 MG tablet Commonly known as: TRILEPTAL Take 1 tablet (300 mg total) by mouth 2 (two) times daily.   predniSONE 10 MG tablet Commonly known as: DELTASONE Take 4 tablets (40 mg total) by mouth daily for 3 days.   propranolol 60 MG tablet Commonly known as: INDERAL Take 1 tablet (60 mg total) by mouth daily. On an empty stomach   rosuvastatin 20 MG tablet Commonly known as: CRESTOR Take 20 mg by mouth daily.   Vitamin D3 50 MCG (2000 UT) capsule Take  2,000 Units by mouth daily.               Durable Medical Equipment  (From admission, onward)           Start     Ordered   04/17/23 1650  For home use only DME oxygen  Once       Question Answer Comment  Length of Need Lifetime   Mode or (Route) Nasal cannula   Liters per Minute 4   Frequency Continuous (stationary and portable oxygen unit needed)   Oxygen conserving device Yes   Oxygen delivery system Gas      04/17/23 1649   04/16/23 1321  For home use only DME Ventilator  Once       Question:  Length of Need  Answer:  Lifetime   04/16/23 1320            Follow-up Information     [facility] Schedule  an appointment as soon as possible for a visit in 1 week(s).   Contact information: [address] [place] [address] [phone]                Allergies  Allergen Reactions   Bee Venom Hives    Consultations: None   Procedures/Studies: DG Chest Port 1 View  Result Date: 04/15/2023 CLINICAL DATA:  dyspnea EXAM: PORTABLE CHEST - 1 VIEW COMPARISON:  04/04/2023 FINDINGS: Relatively low lung volumes with somewhat coarse bronchovascular markings. Some scattered of the lower opacities in the left upper lobe are new since previous. Heart size upper limits normal. Aortic Atherosclerosis (ICD10-170.0). No effusion. Visualized bones unremarkable. IMPRESSION: Low lung volumes with new left upper lobe opacities suggesting infiltrate. Electronically Signed   By: Corlis Leak M.D.   On: 04/15/2023 08:23   DG CHEST PORT 1 VIEW  Result Date: 04/04/2023 CLINICAL DATA:  Dyspnea EXAM: PORTABLE CHEST 1 VIEW COMPARISON:  04/01/2023 and 03/30/2023 FINDINGS: Atherosclerotic calcification of the aortic arch. The lungs appear clear. Heart size within normal limits. No blunting of the costophrenic angles. Right glenohumeral arthropathy possible with free osteochondral fragments. Thoracic spondylosis. IMPRESSION: 1. No acute findings. 2. Right  glenohumeral arthropathy with suspected free osteochondral fragments. 3. Thoracic spondylosis. 4.  Aortic Atherosclerosis (ICD10-I70.0). Electronically Signed   By: Gaylyn Rong M.D.   On: 04/04/2023 18:04   CT Angio Chest Pulmonary Embolism (PE) W or WO Contrast  Result Date: 04/01/2023 CLINICAL DATA:  Positive D-dimer. Clinical concern for pulmonary embolus. EXAM: CT ANGIOGRAPHY CHEST WITH CONTRAST TECHNIQUE: Multidetector CT imaging of the chest was performed using the standard protocol during bolus administration of intravenous contrast. Multiplanar CT image reconstructions and MIPs were obtained to evaluate the vascular anatomy. RADIATION DOSE REDUCTION: This exam was performed according to the departmental dose-optimization program which includes automated exposure control, adjustment of the mA and/or kV according to patient size and/or use of iterative reconstruction technique. CONTRAST:  75mL OMNIPAQUE IOHEXOL 350 MG/ML SOLN COMPARISON:  04/27/2021 FINDINGS: Cardiovascular: The heart size is upper normal to borderline enlarged. No substantial pericardial effusion. Mild atherosclerotic calcification is noted in the wall of the thoracic aorta. There is no filling defect within the opacified pulmonary arteries to suggest the presence of an acute pulmonary embolus. Mediastinum/Nodes: No mediastinal lymphadenopathy. 12 mm short axis right hilar lymph node is upper normal to mildly enlarged. No left hilar lymphadenopathy. The esophagus has normal imaging features. There is no axillary lymphadenopathy. Lungs/Pleura: Subsegmental atelectasis noted in the dependent lung bases. No focal airspace consolidation. No pulmonary edema or pleural effusion. No suspicious pulmonary nodule or mass. Patchy ground-glass attenuation in the right lung apex may be infectious/inflammatory. Upper Abdomen: Small cyst upper pole left kidney is similar to prior. No followup imaging is recommended. Musculoskeletal: No worrisome  lytic or sclerotic osseous abnormality. Review of the MIP images confirms the above findings. IMPRESSION: 1. No CT evidence for acute pulmonary embolus. 2. Patchy ground-glass attenuation in the right lung apex may be infectious/inflammatory. 3. Borderline to mildly enlarged right hilar lymph node, likely reactive. Follow-up CT chest with contrast in 3 months recommended to ensure resolution. 4.  Aortic Atherosclerosis (ICD10-I70.0). Electronically Signed   By: Kennith Center M.D.   On: 04/01/2023 11:44   ECHOCARDIOGRAM COMPLETE  Result Date: 03/31/2023    ECHOCARDIOGRAM REPORT   Patient Name:   [name] Date of Exam: 03/31/2023 Medical Rec #:  [id number]       Height:  66.0 in Accession #:    [id number]      Weight:       321.6 lb Date of Birth:  06-Apr-1966       BSA:          2.447 m Patient Age:    57 years        BP:           176/86 mmHg Patient Gender: F               HR:           85 bpm. [facility] Procedure: 2D Echo, Cardiac Doppler and Color Doppler Indications:    CHF-Acute Diastolic I50.31  History:        Patient has prior history of Echocardiogram examinations, most                 recent 04/23/2021. COPD; Risk Factors:Hypertension, Diabetes and                 Dyslipidemia. Acute respiratory failure with hypoxia (HCC),                 Elevated brain natriuretic peptide (BNP) and Troponin levels,                 Bipolar disorder, current episode manic severe with psychotic                 features (HCC).  Sonographer:    Celesta Gentile RCS Referring Phys: [id number] OLADAPO ADEFESO IMPRESSIONS  1. Left ventricular ejection fraction, by estimation, is 60 to 65%. The left ventricle has normal function. The left ventricle has no regional wall motion abnormalities. There is mild left ventricular hypertrophy. Left ventricular diastolic parameters are consistent with Grade I diastolic dysfunction (impaired relaxation).  2. Right ventricular systolic function is normal. The right  ventricular size is normal.  3. Right atrial size was mildly dilated.  4. The mitral valve is abnormal. No evidence of mitral valve regurgitation. No evidence of mitral stenosis. Moderate mitral annular calcification.  5. The aortic valve is tricuspid. There is mild calcification of the aortic valve. There is mild thickening of the aortic valve. Aortic valve regurgitation is not visualized. Aortic valve sclerosis is present, with no evidence of aortic valve stenosis.  6. The inferior vena cava is dilated in size with >50% respiratory variability, suggesting right atrial pressure of 8 mmHg. FINDINGS  Left Ventricle: Left ventricular ejection fraction, by estimation, is 60 to 65%. The left ventricle has normal function. The left ventricle has no regional wall motion abnormalities. The left ventricular internal cavity size was normal in size. There is  mild left ventricular hypertrophy. Left ventricular diastolic parameters are consistent with Grade I diastolic dysfunction (impaired relaxation). Right Ventricle: The right ventricular size is normal. No increase in right ventricular wall thickness. Right ventricular systolic function is normal. Left Atrium: Left atrial size was normal in size. Right Atrium: Right atrial size was mildly dilated. Pericardium: There is no evidence of pericardial effusion. Mitral Valve: The mitral valve is abnormal. There is mild thickening of the mitral valve leaflet(s). Moderate mitral annular calcification. No evidence of mitral valve regurgitation. No evidence of mitral valve stenosis. MV peak gradient, 12.8 mmHg. The mean mitral valve gradient is 3.0 mmHg. Tricuspid Valve: The tricuspid valve is normal in structure. Tricuspid valve regurgitation is trivial. No evidence of tricuspid stenosis. Aortic Valve: The aortic valve is tricuspid. There is mild calcification of the  aortic valve. There is mild thickening of the aortic valve. Aortic valve regurgitation is not visualized. Aortic  valve sclerosis is present, with no evidence of aortic valve stenosis. Pulmonic Valve: The pulmonic valve was normal in structure. Pulmonic valve regurgitation is mild. No evidence of pulmonic stenosis. Aorta: The aortic root is normal in size and structure. Venous: The inferior vena cava is dilated in size with greater than 50% respiratory variability, suggesting right atrial pressure of 8 mmHg. IAS/Shunts: The interatrial septum appears to be lipomatous. No atrial level shunt detected by color flow Doppler.  LEFT VENTRICLE PLAX 2D LVIDd:         4.80 cm   Diastology LVIDs:         2.10 cm   LV e' medial:    7.18 cm/s LV PW:         1.20 cm   LV E/e' medial:  21.2 LV IVS:        1.30 cm   LV e' lateral:   11.90 cm/s LVOT diam:     1.90 cm   LV E/e' lateral: 12.8 LV SV:         116 LV SV Index:   47 LVOT Area:     2.84 cm  RIGHT VENTRICLE RV S prime:     18.00 cm/s TAPSE (M-mode): 2.7 cm LEFT ATRIUM           Index        RIGHT ATRIUM           Index LA diam:      3.70 cm 1.51 cm/m   RA Area:     23.00 cm LA Vol (A2C): 76.4 ml 31.22 ml/m  RA Volume:   81.40 ml  33.26 ml/m LA Vol (A4C): 64.4 ml 26.31 ml/m  AORTIC VALVE LVOT Vmax:   202.00 cm/s LVOT Vmean:  135.000 cm/s LVOT VTI:    0.410 m  AORTA Ao Root diam: 3.40 cm MITRAL VALVE MV Area (PHT): 3.72 cm     SHUNTS MV Area VTI:   2.70 cm     Systemic VTI:  0.41 m MV Peak grad:  12.8 mmHg    Systemic Diam: 1.90 cm MV Mean grad:  3.0 mmHg MV Vmax:       1.79 m/s MV Vmean:      75.3 cm/s MV Decel Time: 204 msec MV E velocity: 152.00 cm/s MV A velocity: 181.00 cm/s MV E/A ratio:  0.84 Charlton [facility]s MD Electronically signed by Charlton Haws MD Signature Date/Time: 03/31/2023/1:14:09 PM    Final    DG Chest Port 1 View  Result Date: 03/30/2023 CLINICAL DATA:  Short of breath. EXAM: PORTABLE CHEST 1 VIEW COMPARISON:  07/08/2022 and older studies. FINDINGS: Cardiac silhouette is normal in size. No mediastinal or hilar masses. Lungs are clear. No convincing pleural  effusion and no pneumothorax. Skeletal structures are grossly intact. IMPRESSION: No active disease. Electronically Signed   By: Amie Portland M.D.   On: 03/30/2023 18:46     Discharge Exam: Vitals:   04/18/23 0713 04/18/23 0820  BP:  126/66  Pulse:  87  Resp:  19  Temp:  98 F (36.7 C)  SpO2: 100% 97%   Vitals:   04/17/23 2228 04/18/23 0546 04/18/23 0713 04/18/23 0820  BP:  129/75  126/66  Pulse: 72 78  87  Resp: 18 20  19   Temp:  98.3 F (36.8 C)  98 F (36.7 C)  TempSrc:  Oral  Oral  SpO2: 91% 100% 100% 97%  Weight:      Height:        General: Pt is alert, awake, not in acute distress Cardiovascular: RRR, S1/S2 +, no rubs, no gallops Respiratory: CTA bilaterally, no wheezing, no rhonchi, 4 L nasal cannula Abdominal: Soft, NT, ND, bowel sounds + Extremities: no edema, no cyanosis    The results of significant diagnostics from this hospitalization (including imaging, microbiology, ancillary and laboratory) are listed below for reference.     Microbiology: Recent Results (from the past 240 hour(s))  Resp panel by RT-PCR (RSV, Flu A&B, Covid) Anterior Nasal Swab     Status: None   Collection Time: 04/15/23  8:00 AM   Specimen: Anterior Nasal Swab  Result Value Ref Range Status   SARS Coronavirus 2 by RT PCR NEGATIVE NEGATIVE Final    Comment: (NOTE) SARS-CoV-2 target nucleic acids are NOT DETECTED.  The SARS-CoV-2 RNA is generally detectable in upper respiratory specimens during the acute phase of infection. The lowest concentration of SARS-CoV-2 viral copies this assay can detect is 138 copies/mL. A negative result does not preclude SARS-Cov-2 infection and should not be used as the sole basis for treatment or other patient management decisions. A negative result may occur with  improper specimen collection/handling, submission of specimen other than nasopharyngeal swab, presence of viral mutation(s) within the areas targeted by this assay, and inadequate  number of viral copies(<138 copies/mL). A negative result must be combined with clinical observations, patient history, and epidemiological information. The expected result is Negative.  Fact Sheet for Patients:  BloggerCourse.com  Fact Sheet for Healthcare Providers:  SeriousBroker.it  This test is no t yet approved or cleared by the [place] FDA and  has been authorized for detection and/or diagnosis of SARS-CoV-2 by FDA under an Emergency Use Authorization (EUA). This EUA will remain  in effect (meaning this test can be used) for the duration of the COVID-19 declaration under Section 564(b)(1) of the Act, 21 U.S.C.section 360bbb-3(b)(1), unless the authorization is terminated  or revoked sooner.       Influenza A by PCR NEGATIVE NEGATIVE Final   Influenza B by PCR NEGATIVE NEGATIVE Final    Comment: (NOTE) The Xpert Xpress SARS-CoV-2/FLU/RSV plus assay is intended as an aid in the diagnosis of influenza from Nasopharyngeal swab specimens and should not be used as a sole basis for treatment. Nasal washings and aspirates are unacceptable for Xpert Xpress SARS-CoV-2/FLU/RSV testing.  Fact Sheet for Patients: BloggerCourse.com  Fact Sheet for Healthcare Providers: SeriousBroker.it  This test is not yet approved or cleared by the [place] FDA and has been authorized for detection and/or diagnosis of SARS-CoV-2 by FDA under an Emergency Use Authorization (EUA). This EUA will remain in effect (meaning this test can be used) for the duration of the COVID-19 declaration under Section 564(b)(1) of the Act, 21 U.S.C. section 360bbb-3(b)(1), unless the authorization is terminated or revoked.     Resp Syncytial Virus by PCR NEGATIVE NEGATIVE Final    Comment: (NOTE) Fact Sheet for Patients: BloggerCourse.com  Fact Sheet for Healthcare  Providers: SeriousBroker.it  This test is not yet approved or cleared by the [place] FDA and has been authorized for detection and/or diagnosis of SARS-CoV-2 by FDA under an Emergency Use Authorization (EUA). This EUA will remain in effect (meaning this test can be used) for the duration of the COVID-19 declaration under Section 564(b)(1) of the Act, 21 U.S.C. section 360bbb-3(b)(1), unless the authorization is terminated or revoked.  Performed at [facility], [address], [place], [place] [address]      Labs: BNP (last 3 results) Recent Labs    03/30/23 1836 04/05/23 1042 04/15/23 0800  BNP 505.0* 45.0 510.0*   Basic Metabolic Panel: Recent Labs  Lab 04/15/23 0800 04/15/23 0809 04/16/23 0311 04/17/23 0533 04/18/23 0312  NA 138 139 138 142 140  K 4.6 4.7 4.9 4.6 5.1  CL 102 103 102 100 97*  CO2 29  --  27 32 35*  GLUCOSE 202* 197* 231* 207* 276*  BUN 45* 40* 51* 51* 49*  CREATININE 1.49* 1.60* 1.50* 1.49* 1.41*  CALCIUM 8.7*  --  8.3* 8.9 8.7*  MG 2.0  --  2.1 2.1  --    Liver Function Tests: Recent Labs  Lab 04/15/23 0800  AST 20  ALT 33  ALKPHOS 106  BILITOT 1.0  PROT 6.4*  ALBUMIN 3.2*   No results for input(s): "LIPASE", "AMYLASE" in the last 168 hours. No results for input(s): "AMMONIA" in the last 168 hours. CBC: Recent Labs  Lab 04/15/23 0800 04/15/23 0809 04/18/23 0312  WBC 7.0  --  7.0  NEUTROABS 4.7  --   --   HGB 11.8* 12.9 11.5*  HCT 40.0 38.0 40.2  MCV 95.2  --  96.9  PLT 235  --  206   Cardiac Enzymes: No results for input(s): "CKTOTAL", "CKMB", "CKMBINDEX", "TROPONINI" in the last 168 hours. BNP: Invalid input(s): "POCBNP" CBG: Recent Labs  Lab 04/17/23 1623 04/17/23 2039 04/18/23 0225 04/18/23 0457 04/18/23 0724  GLUCAP 83 231* 258* 279* 269*   D-Dimer No results for input(s): "DDIMER" in the last 72 hours. Hgb A1c No results for input(s): "HGBA1C" in the last 72 hours. Lipid  Profile No results for input(s): "CHOL", "HDL", "LDLCALC", "TRIG", "CHOLHDL", "LDLDIRECT" in the last 72 hours. Thyroid function studies No results for input(s): "TSH", "T4TOTAL", "T3FREE", "THYROIDAB" in the last 72 hours.  Invalid input(s): "FREET3" Anemia work up No results for input(s): "VITAMINB12", "FOLATE", "FERRITIN", "TIBC", "IRON", "RETICCTPCT" in the last 72 hours. Urinalysis    Component Value Date/Time   COLORURINE STRAW (A) 04/02/2023 1230   APPEARANCEUR CLEAR 04/02/2023 1230   LABSPEC 1.009 04/02/2023 1230   PHURINE 5.0 04/02/2023 1230   GLUCOSEU NEGATIVE 04/02/2023 1230   HGBUR SMALL (A) 04/02/2023 1230   BILIRUBINUR NEGATIVE 04/02/2023 1230   KETONESUR NEGATIVE 04/02/2023 1230   PROTEINUR 30 (A) 04/02/2023 1230   UROBILINOGEN 0.2 04/11/2014 1927   NITRITE NEGATIVE 04/02/2023 1230   LEUKOCYTESUR MODERATE (A) 04/02/2023 1230   Sepsis Labs Recent Labs  Lab 04/15/23 0800 04/18/23 0312  WBC 7.0 7.0   Microbiology Recent Results (from the past 240 hour(s))  Resp panel by RT-PCR (RSV, Flu A&B, Covid) Anterior Nasal Swab     Status: None   Collection Time: 04/15/23  8:00 AM   Specimen: Anterior Nasal Swab  Result Value Ref Range Status   SARS Coronavirus 2 by RT PCR NEGATIVE NEGATIVE Final    Comment: (NOTE) SARS-CoV-2 target nucleic acids are NOT DETECTED.  The SARS-CoV-2 RNA is generally detectable in upper respiratory specimens during the acute phase of infection. The lowest concentration of SARS-CoV-2 viral copies this assay can detect is 138 copies/mL. A negative result does not preclude SARS-Cov-2 infection and should not be used as the sole basis for treatment or other patient management decisions. A negative result may occur with  improper specimen collection/handling, submission of specimen other than nasopharyngeal swab, presence of viral mutation(s)  within the areas targeted by this assay, and inadequate number of viral copies(<138 copies/mL). A  negative result must be combined with clinical observations, patient history, and epidemiological information. The expected result is Negative.  Fact Sheet for Patients:  BloggerCourse.com  Fact Sheet for Healthcare Providers:  SeriousBroker.it  This test is no t yet approved or cleared by the [place] FDA and  has been authorized for detection and/or diagnosis of SARS-CoV-2 by FDA under an Emergency Use Authorization (EUA). This EUA will remain  in effect (meaning this test can be used) for the duration of the COVID-19 declaration under Section 564(b)(1) of the Act, 21 U.S.C.section 360bbb-3(b)(1), unless the authorization is terminated  or revoked sooner.       Influenza A by PCR NEGATIVE NEGATIVE Final   Influenza B by PCR NEGATIVE NEGATIVE Final    Comment: (NOTE) The Xpert Xpress SARS-CoV-2/FLU/RSV plus assay is intended as an aid in the diagnosis of influenza from Nasopharyngeal swab specimens and should not be used as a sole basis for treatment. Nasal washings and aspirates are unacceptable for Xpert Xpress SARS-CoV-2/FLU/RSV testing.  Fact Sheet for Patients: BloggerCourse.com  Fact Sheet for Healthcare Providers: SeriousBroker.it  This test is not yet approved or cleared by the [place] FDA and has been authorized for detection and/or diagnosis of SARS-CoV-2 by FDA under an Emergency Use Authorization (EUA). This EUA will remain in effect (meaning this test can be used) for the duration of the COVID-19 declaration under Section 564(b)(1) of the Act, 21 U.S.C. section 360bbb-3(b)(1), unless the authorization is terminated or revoked.     Resp Syncytial Virus by PCR NEGATIVE NEGATIVE Final    Comment: (NOTE) Fact Sheet for Patients: BloggerCourse.com  Fact Sheet for Healthcare  Providers: SeriousBroker.it  This test is not yet approved or cleared by the [place] FDA and has been authorized for detection and/or diagnosis of SARS-CoV-2 by FDA under an Emergency Use Authorization (EUA). This EUA will remain in effect (meaning this test can be used) for the duration of the COVID-19 declaration under Section 564(b)(1) of the Act, 21 U.S.C. section 360bbb-3(b)(1), unless the authorization is terminated or revoked.  Performed at [facility], [address], [place], [place] [address]      Time coordinating discharge: 35 minutes  SIGNED:   Erick Blinks, DO [facility] 04/18/2023, 10:34 AM  If 7PM-7AM, please contact night-coverage [email or web address]

## 2023-06-21 ENCOUNTER — Emergency Department (HOSPITAL_COMMUNITY): Payer: Medicaid Other

## 2023-06-21 ENCOUNTER — Observation Stay (HOSPITAL_COMMUNITY)
Admission: EM | Admit: 2023-06-21 | Discharge: 2023-06-23 | Disposition: A | Discharge status: Home / Self Care | Payer: Medicaid Other | Attending: Student | Admitting: Student

## 2023-06-21 DIAGNOSIS — Z7984 Long term (current) use of oral hypoglycemic drugs: Secondary | ICD-10-CM | POA: Insufficient documentation

## 2023-06-21 DIAGNOSIS — G20C Parkinsonism, unspecified: Secondary | ICD-10-CM | POA: Insufficient documentation

## 2023-06-21 DIAGNOSIS — F4325 Adjustment disorder with mixed disturbance of emotions and conduct: Secondary | ICD-10-CM | POA: Diagnosis present

## 2023-06-21 DIAGNOSIS — E782 Mixed hyperlipidemia: Secondary | ICD-10-CM | POA: Diagnosis not present

## 2023-06-21 DIAGNOSIS — E1165 Type 2 diabetes mellitus with hyperglycemia: Secondary | ICD-10-CM | POA: Insufficient documentation

## 2023-06-21 DIAGNOSIS — J449 Chronic obstructive pulmonary disease, unspecified: Secondary | ICD-10-CM | POA: Diagnosis not present

## 2023-06-21 DIAGNOSIS — N1831 Chronic kidney disease, stage 3a: Secondary | ICD-10-CM | POA: Diagnosis present

## 2023-06-21 DIAGNOSIS — Z79899 Other long term (current) drug therapy: Secondary | ICD-10-CM | POA: Diagnosis not present

## 2023-06-21 DIAGNOSIS — R531 Weakness: Principal | ICD-10-CM | POA: Insufficient documentation

## 2023-06-21 DIAGNOSIS — R42 Dizziness and giddiness: Principal | ICD-10-CM | POA: Insufficient documentation

## 2023-06-21 DIAGNOSIS — Z23 Encounter for immunization: Secondary | ICD-10-CM | POA: Insufficient documentation

## 2023-06-21 DIAGNOSIS — E1122 Type 2 diabetes mellitus with diabetic chronic kidney disease: Secondary | ICD-10-CM | POA: Diagnosis not present

## 2023-06-21 DIAGNOSIS — Z7982 Long term (current) use of aspirin: Secondary | ICD-10-CM | POA: Insufficient documentation

## 2023-06-21 DIAGNOSIS — Z794 Long term (current) use of insulin: Secondary | ICD-10-CM | POA: Diagnosis not present

## 2023-06-21 DIAGNOSIS — R29898 Other symptoms and signs involving the musculoskeletal system: Secondary | ICD-10-CM

## 2023-06-21 DIAGNOSIS — I10 Essential (primary) hypertension: Secondary | ICD-10-CM | POA: Diagnosis present

## 2023-06-21 LAB — CBC
HCT: 37.4 % (ref 36.0–46.0)
Hemoglobin: 11 g/dL — ABNORMAL LOW (ref 12.0–15.0)
MCH: 27.9 pg (ref 26.0–34.0)
MCHC: 29.4 g/dL — ABNORMAL LOW (ref 30.0–36.0)
MCV: 94.9 fL (ref 80.0–100.0)
Platelets: 161 10*3/uL (ref 150–400)
RBC: 3.94 MIL/uL (ref 3.87–5.11)
RDW: 14.9 % (ref 11.5–15.5)
WBC: 4.5 10*3/uL (ref 4.0–10.5)
nRBC: 0 % (ref 0.0–0.2)

## 2023-06-21 LAB — TROPONIN I (HIGH SENSITIVITY)
Troponin I (High Sensitivity): 8 ng/L (ref <18)
Troponin I (High Sensitivity): 9 ng/L (ref <18)

## 2023-06-21 LAB — BASIC METABOLIC PANEL
Anion gap: 4 — ABNORMAL LOW (ref 5–15)
BUN: 41 mg/dL — ABNORMAL HIGH (ref 6–20)
CO2: 36 mmol/L — ABNORMAL HIGH (ref 22–32)
Calcium: 8.6 mg/dL — ABNORMAL LOW (ref 8.9–10.3)
Chloride: 99 mmol/L (ref 98–111)
Creatinine, Ser: 1.38 mg/dL — ABNORMAL HIGH (ref 0.44–1.00)
GFR, Estimated: 44 mL/min — ABNORMAL LOW (ref >60)
Glucose, Bld: 223 mg/dL — ABNORMAL HIGH (ref 70–99)
Potassium: 4.9 mmol/L (ref 3.5–5.1)
Sodium: 139 mmol/L (ref 135–145)

## 2023-06-21 LAB — URINALYSIS, ROUTINE W REFLEX MICROSCOPIC
Bacteria, UA: NONE SEEN
Bilirubin Urine: NEGATIVE
Glucose, UA: 50 mg/dL — AB
Ketones, ur: NEGATIVE mg/dL
Leukocytes,Ua: NEGATIVE
Nitrite: NEGATIVE
Protein, ur: 100 mg/dL — AB
Specific Gravity, Urine: 1.013 (ref 1.005–1.030)
pH: 5 (ref 5.0–8.0)

## 2023-06-21 LAB — CBG MONITORING, ED: Glucose-Capillary: 167 mg/dL — ABNORMAL HIGH (ref 70–99)

## 2023-06-21 MED ORDER — SODIUM CHLORIDE 0.9 % IV BOLUS
500.0000 mL | Freq: Once | INTRAVENOUS | Status: AC
  Administered 2023-06-21: 500 mL via INTRAVENOUS

## 2023-06-21 MED ORDER — MECLIZINE HCL 12.5 MG PO TABS
25.0000 mg | ORAL_TABLET | Freq: Once | ORAL | Status: AC
  Administered 2023-06-21: 25 mg via ORAL
  Filled 2023-06-21: qty 2

## 2023-06-21 MED ORDER — SODIUM CHLORIDE 0.9 % IV SOLN: INTRAVENOUS | Status: DC

## 2023-06-21 NOTE — ED Notes (Signed)
Pt asking for someone to wash her hair and give her something to eat. Pt provided with sandwich and drink. Pt stated she bahes herself at home offered patient bathing supplies and she stated " No I want someone to bathe me before I go to bed"

## 2023-06-21 NOTE — ED Provider Notes (Signed)
[place] EMERGENCY DEPARTMENT AT [facility] Provider Note   CSN: [id number] Arrival date & time: 06/21/23  1728     History  Chief Complaint  Patient presents with   Dizziness   Urinary Frequency    [name] is a 57 y.o. female.  57 year old female with past medical history of diabetes, hypertension, and hyperlipidemia who is also on 4 L home oxygen at baseline presenting to the emergency department today with dizziness and she describes weakness in her right hand that has been going on now for the past several days.  The patient states that the symptoms began 3 days ago.  She reports she normally gets around with a walker and this has been difficult due to the symptoms.  She states that today got to the point that she was not able to walk due to dizziness.  She was brought to the emergency department at that time for further evaluation.  She states that she normally does wear a diaper but has been urinating more than normal recently.  She is somewhat of a difficult historian   Dizziness Urinary Frequency       Home Medications Prior to Admission medications   Medication Sig Start Date End Date Taking? Authorizing Provider  aspirin EC 81 MG tablet Take 81 mg by mouth daily.   Yes [provider]  Cholecalciferol (VITAMIN D3) 50 MCG (2000 UT) capsule Take 2,000 Units by mouth daily.   Yes [provider]  FLUoxetine (PROZAC) 20 MG capsule Take 20 mg by mouth daily.   Yes [provider]  insulin aspart (NOVOLOG) 100 UNIT/ML injection CBG < 70: implement hypoglycemia protocol CBG 70 - 120: 0 units CBG 121 - 150: 3 units CBG 151 - 200: 4 units CBG 201 - 250: 7 units CBG 251 - 300: 11 units CBG 301 - 350: 15 units CBG 351 - 400: 20 units Patient taking differently: Inject 3-20 Units into the skin 4 (four) times daily -  with meals and at bedtime. CBG < 70: implement hypoglycemia protocol CBG 70 - 120: 0 units CBG 121 - 150: 3  units      CBG 151 - 200: 4 units CBG 201 - 250: 7 units CBG 251 - 300: 11 units CBG 301 - 350: 15 units CBG 351 - 400: 20 units 04/20/14  Yes Kathlen Mody, MD  isosorbide mononitrate (IMDUR) 30 MG 24 hr tablet Take 1 tablet (30 mg total) by mouth daily. 07/08/22  Yes Terrilee Files, MD  LANTUS SOLOSTAR 100 UNIT/ML Solostar Pen Inject 55 Units into the skin 2 (two) times daily. 02/13/23  Yes [provider]  lisinopril (ZESTRIL) 2.5 MG tablet Take 2.5 mg by mouth daily. 02/03/23  Yes [provider]  loratadine (CLARITIN) 10 MG tablet Take 10 mg by mouth daily.   Yes [provider]  MYRBETRIQ 25 MG TB24 tablet Take 25 mg by mouth daily. 02/03/23  Yes [provider]  OLANZapine (ZYPREXA) 10 MG tablet Take 1 tablet (10 mg total) by mouth at bedtime. 04/05/23  Yes Tat, Onalee Hua, MD  Oxcarbazepine (TRILEPTAL) 300 MG tablet Take 1 tablet (300 mg total) by mouth 2 (two) times daily. 04/05/23  Yes Tat, Onalee Hua, MD  propranolol (INDERAL) 60 MG tablet Take 1 tablet (60 mg total) by mouth daily. On an empty stomach 07/08/22  Yes Terrilee Files, MD  rosuvastatin (CRESTOR) 20 MG tablet Take 20 mg by mouth daily. 02/03/23  Yes [provider]      Allergies    Bee venom    Review of Systems   Review of Systems  Genitourinary:  Positive for frequency.  Neurological:  Positive for dizziness.  All other systems reviewed and are negative.   Physical Exam Updated Vital Signs BP (!) 90/49   Pulse 67   Temp 99.7 F (37.6 C) (Oral)   Resp 17   Wt (!) 158 kg   SpO2 96%   BMI 54.57 kg/m  Physical Exam Vitals and nursing note reviewed.   Gen: Chronically ill-appearing, no acute distress Eyes: PERRL, EOMI HEENT: no oropharyngeal swelling Neck: trachea midline Resp: clear to auscultation bilaterally Card: RRR, no murmurs, rubs, or gallops Abd: nontender, nondistended Extremities: no calf tenderness, no edema Vascular: 2+ radial pulses bilaterally, 2+  DP pulses bilaterally Neuro: NIH stroke scale of 1 for dysarthria Skin: no rashes Psyc: acting appropriately   ED Results / Procedures / Treatments   Labs (all labs ordered are listed, but only abnormal results are displayed) Labs Reviewed  BASIC METABOLIC PANEL - Abnormal; Notable for the following components:      Result Value   CO2 36 (*)    Glucose, Bld 223 (*)    BUN 41 (*)    Creatinine, Ser 1.38 (*)    Calcium 8.6 (*)    GFR, Estimated 44 (*)    Anion gap 4 (*)    All other components within normal limits  CBC - Abnormal; Notable for the following components:   Hemoglobin 11.0 (*)    MCHC 29.4 (*)    All other components within normal limits  URINALYSIS, ROUTINE W REFLEX MICROSCOPIC - Abnormal; Notable for the following components:   APPearance HAZY (*)    Glucose, UA 50 (*)    Hgb urine dipstick MODERATE (*)    Protein, ur 100 (*)    All other components within normal limits  CBG MONITORING, ED - Abnormal; Notable for the following components:   Glucose-Capillary 167 (*)    All other components within normal limits  TROPONIN I (HIGH SENSITIVITY)  TROPONIN I (HIGH SENSITIVITY)    EKG EKG Interpretation Date/Time:  Thursday June 21 2023 17:51:04 EDT Ventricular Rate:  75 PR Interval:  169 QRS Duration:  137 QT Interval:  400 QTC Calculation: 447 R Axis:   -81  Text Interpretation: Sinus rhythm RBBB and LAFB Similar in morphology to previous EKG Confirmed by Beckey Downing [phone]) on 06/21/2023 6:00:56 PM  Radiology CT Head Wo Contrast  Result Date: 06/21/2023 CLINICAL DATA:  Dizziness for 3 days, dropping things out of right arm/hand for the past few days. Neuro deficit, acute, stroke suspected. EXAM: CT HEAD WITHOUT CONTRAST TECHNIQUE: Contiguous axial images were obtained from the base of the skull through the vertex without intravenous contrast. RADIATION DOSE REDUCTION: This exam was performed according to the departmental dose-optimization program which  includes automated exposure control, adjustment of the mA and/or kV according to patient size and/or use of iterative reconstruction technique. COMPARISON:  07/28/2022. FINDINGS: Brain: No acute intracranial hemorrhage, midline shift or mass effect. No extra-axial fluid collection. Mild diffuse atrophy is noted. Periventricular white matter hypodensities are present bilaterally. No hydrocephalus. Vascular: No hyperdense vessel or unexpected calcification. Skull: Normal. Negative for fracture or focal lesion. Sinuses/Orbits: No acute finding. Other: Increased density is noted in the scalp as compared to the prior exam, possible edema or contusion. IMPRESSION: 1. No acute intracranial process. 2. Atrophy with chronic microvascular ischemic changes. Electronically Signed  By: Thornell Sartorius M.D.   On: 06/21/2023 20:59   DG Chest Port 1 View  Result Date: 06/21/2023 CLINICAL DATA:  Shortness of breath EXAM: PORTABLE CHEST 1 VIEW COMPARISON:  04/15/2023 FINDINGS: Stable cardiomegaly. Aortic atherosclerotic calcification. Low lung volumes with coarse bronchovascular markings similar to prior. Bibasilar atelectasis. No pleural effusion or pneumothorax. No displaced rib fracture. IMPRESSION: Low lung volumes with basilar atelectasis.  Cardiomegaly. Electronically Signed   By: Minerva Fester M.D.   On: 06/21/2023 20:30    Procedures Procedures    Medications Ordered in ED Medications  sodium chloride 0.9 % bolus 500 mL (has no administration in time range)  meclizine (ANTIVERT) tablet 25 mg (25 mg Oral Given 06/21/23 1924)    ED Course/ Medical Decision Making/ A&P                                 Medical Decision Making 57 year old female with past medical history of diabetes, hypertension, and hyperlipidemia presenting to the emergency department today with what she describes as disequilibrium and weakness/incoordination in the right upper extremity.  I do not appreciate any significant weakness here on  exam.  I will further evaluate her here with basic labs to evaluate for electrolyte abnormalities in addition to a CT scan of her head to evaluate for intracranial hemorrhage or mass lesion.  Certainly is possible this is due to CVA given her risk factors.  Her symptoms have been going on for 3 days now and she does not have any symptoms consistent with endorgan dysfunction at this time.  I give the patient meclizine in the event that this is due to peripheral vertigo.  Will obtain a urinalysis to evaluate for urinary tract infection that may be complicating matters.  I will reevaluate for ultimate disposition.  The patient's EKG interpreted by me shows a sinus rhythm with right bundle branch block and left anterior fascicular block.  I do not appreciate a STEMI at this time.  This does appear similar in morphology to her previous EKGs in our system.  She is also denying any chest pain currently.  The patient's CT scan is unremarkable.  Her labs are reassuring.  She was unable to ambulate here in the still complaining of dizziness and nursing staff was concerned about her ambulating at home on her own.  Calls placed to hospitalist service for admission for further evaluation with MRI and physical therapy evaluation.  Amount and/or Complexity of Data Reviewed Labs: ordered. Radiology: ordered.  Risk Decision regarding hospitalization.           Final Clinical Impression(s) / ED Diagnoses Final diagnoses:  Dizziness  Right arm weakness    Rx / DC Orders ED Discharge Orders     None         Durwin Glaze, MD 06/21/23 2210

## 2023-06-21 NOTE — ED Triage Notes (Signed)
Pt states has been dropping things out of right arm hand past few days

## 2023-06-21 NOTE — ED Triage Notes (Signed)
Pt BIB EMS from Home with complaints of dizziness x 3 days. Urinary frequency with foul odor as well.

## 2023-06-22 ENCOUNTER — Observation Stay (HOSPITAL_COMMUNITY): Payer: Medicaid Other

## 2023-06-22 ENCOUNTER — Other Ambulatory Visit (HOSPITAL_COMMUNITY): Payer: Medicaid Other

## 2023-06-22 DIAGNOSIS — N1831 Chronic kidney disease, stage 3a: Secondary | ICD-10-CM

## 2023-06-22 DIAGNOSIS — J449 Chronic obstructive pulmonary disease, unspecified: Secondary | ICD-10-CM | POA: Diagnosis not present

## 2023-06-22 DIAGNOSIS — I10 Essential (primary) hypertension: Secondary | ICD-10-CM

## 2023-06-22 DIAGNOSIS — R531 Weakness: Secondary | ICD-10-CM | POA: Diagnosis not present

## 2023-06-22 DIAGNOSIS — Z794 Long term (current) use of insulin: Secondary | ICD-10-CM

## 2023-06-22 DIAGNOSIS — F4325 Adjustment disorder with mixed disturbance of emotions and conduct: Secondary | ICD-10-CM | POA: Diagnosis not present

## 2023-06-22 DIAGNOSIS — E1165 Type 2 diabetes mellitus with hyperglycemia: Secondary | ICD-10-CM

## 2023-06-22 DIAGNOSIS — E782 Mixed hyperlipidemia: Secondary | ICD-10-CM

## 2023-06-22 LAB — CBC
HCT: 38.2 % (ref 36.0–46.0)
HCT: 38.5 % (ref 36.0–46.0)
Hemoglobin: 10.8 g/dL — ABNORMAL LOW (ref 12.0–15.0)
Hemoglobin: 11.1 g/dL — ABNORMAL LOW (ref 12.0–15.0)
MCH: 27.3 pg (ref 26.0–34.0)
MCH: 27.6 pg (ref 26.0–34.0)
MCHC: 28.3 g/dL — ABNORMAL LOW (ref 30.0–36.0)
MCHC: 28.8 g/dL — ABNORMAL LOW (ref 30.0–36.0)
MCV: 95.8 fL (ref 80.0–100.0)
MCV: 96.7 fL (ref 80.0–100.0)
Platelets: 152 10*3/uL (ref 150–400)
Platelets: 167 10*3/uL (ref 150–400)
RBC: 3.95 MIL/uL (ref 3.87–5.11)
RBC: 4.02 MIL/uL (ref 3.87–5.11)
RDW: 14.9 % (ref 11.5–15.5)
RDW: 15.1 % (ref 11.5–15.5)
WBC: 4.8 10*3/uL (ref 4.0–10.5)
WBC: 5 10*3/uL (ref 4.0–10.5)
nRBC: 0 % (ref 0.0–0.2)
nRBC: 0 % (ref 0.0–0.2)

## 2023-06-22 LAB — RAPID URINE DRUG SCREEN, HOSP PERFORMED
Amphetamines: NOT DETECTED
Barbiturates: NOT DETECTED
Benzodiazepines: NOT DETECTED
Cocaine: NOT DETECTED
Opiates: NOT DETECTED
Tetrahydrocannabinol: NOT DETECTED

## 2023-06-22 LAB — BLOOD GAS, VENOUS
Acid-Base Excess: 13.7 mmol/L — ABNORMAL HIGH (ref 0.0–2.0)
Bicarbonate: 42.7 mmol/L — ABNORMAL HIGH (ref 20.0–28.0)
Drawn by: 27160
O2 Saturation: 85.4 %
Patient temperature: 36.8
pCO2, Ven: 80 mm[Hg] (ref 44–60)
pH, Ven: 7.33 (ref 7.25–7.43)
pO2, Ven: 50 mm[Hg] — ABNORMAL HIGH (ref 32–45)

## 2023-06-22 LAB — LIPID PANEL
Cholesterol: 133 mg/dL (ref 0–200)
HDL: 50 mg/dL (ref >40)
LDL Cholesterol: 59 mg/dL (ref 0–99)
Total CHOL/HDL Ratio: 2.7 {ratio}
Triglycerides: 118 mg/dL (ref <150)
VLDL: 24 mg/dL (ref 0–40)

## 2023-06-22 LAB — GLUCOSE, CAPILLARY
Glucose-Capillary: 228 mg/dL — ABNORMAL HIGH (ref 70–99)
Glucose-Capillary: 153 mg/dL — ABNORMAL HIGH (ref 70–99)
Glucose-Capillary: 154 mg/dL — ABNORMAL HIGH (ref 70–99)
Glucose-Capillary: 187 mg/dL — ABNORMAL HIGH (ref 70–99)
Glucose-Capillary: 242 mg/dL — ABNORMAL HIGH (ref 70–99)

## 2023-06-22 LAB — COMPREHENSIVE METABOLIC PANEL
ALT: 23 U/L (ref 0–44)
AST: 16 U/L (ref 15–41)
Albumin: 3.1 g/dL — ABNORMAL LOW (ref 3.5–5.0)
Alkaline Phosphatase: 104 U/L (ref 38–126)
Anion gap: 4 — ABNORMAL LOW (ref 5–15)
BUN: 40 mg/dL — ABNORMAL HIGH (ref 6–20)
CO2: 36 mmol/L — ABNORMAL HIGH (ref 22–32)
Calcium: 8.7 mg/dL — ABNORMAL LOW (ref 8.9–10.3)
Chloride: 100 mmol/L (ref 98–111)
Creatinine, Ser: 1.34 mg/dL — ABNORMAL HIGH (ref 0.44–1.00)
GFR, Estimated: 46 mL/min — ABNORMAL LOW (ref >60)
Glucose, Bld: 215 mg/dL — ABNORMAL HIGH (ref 70–99)
Potassium: 5.1 mmol/L (ref 3.5–5.1)
Sodium: 140 mmol/L (ref 135–145)
Total Bilirubin: 0.4 mg/dL (ref 0.3–1.2)
Total Protein: 6 g/dL — ABNORMAL LOW (ref 6.5–8.1)

## 2023-06-22 LAB — MRSA NEXT GEN BY PCR, NASAL: MRSA by PCR Next Gen: NOT DETECTED

## 2023-06-22 LAB — PROCALCITONIN: Procalcitonin: <0.1 ng/mL

## 2023-06-22 LAB — MAGNESIUM: Magnesium: 2.2 mg/dL (ref 1.7–2.4)

## 2023-06-22 LAB — HEMOGLOBIN A1C
Hgb A1c MFr Bld: 9.9 % — ABNORMAL HIGH (ref 4.8–5.6)
Mean Plasma Glucose: 237.43 mg/dL

## 2023-06-22 MED ORDER — OXCARBAZEPINE 300 MG PO TABS
300.0000 mg | ORAL_TABLET | Freq: Two times a day (BID) | ORAL | Status: DC
  Administered 2023-06-22 – 2023-06-23 (×3): 300 mg via ORAL
  Filled 2023-06-22 (×3): qty 1

## 2023-06-22 MED ORDER — ISOSORBIDE MONONITRATE ER 60 MG PO TB24
30.0000 mg | ORAL_TABLET | Freq: Every day | ORAL | Status: DC
  Administered 2023-06-22 – 2023-06-23 (×2): 30 mg via ORAL
  Filled 2023-06-22 (×2): qty 1

## 2023-06-22 MED ORDER — HEPARIN SODIUM (PORCINE) 5000 UNIT/ML IJ SOLN
5000.0000 [IU] | Freq: Three times a day (TID) | INTRAMUSCULAR | Status: DC
  Administered 2023-06-22 – 2023-06-23 (×5): 5000 [IU] via SUBCUTANEOUS
  Filled 2023-06-22 (×4): qty 1

## 2023-06-22 MED ORDER — ROSUVASTATIN CALCIUM 20 MG PO TABS
20.0000 mg | ORAL_TABLET | Freq: Every day | ORAL | Status: DC
  Administered 2023-06-22 – 2023-06-23 (×2): 20 mg via ORAL
  Filled 2023-06-22 (×2): qty 1

## 2023-06-22 MED ORDER — ALBUTEROL SULFATE (2.5 MG/3ML) 0.083% IN NEBU: 2.5000 mg | INHALATION_SOLUTION | RESPIRATORY_TRACT | Status: DC | PRN

## 2023-06-22 MED ORDER — PROPRANOLOL HCL 20 MG PO TABS
60.0000 mg | ORAL_TABLET | Freq: Every day | ORAL | Status: DC
  Administered 2023-06-22 – 2023-06-23 (×2): 60 mg via ORAL
  Filled 2023-06-22 (×2): qty 3

## 2023-06-22 MED ORDER — OLANZAPINE 5 MG PO TABS
10.0000 mg | ORAL_TABLET | Freq: Every day | ORAL | Status: DC
  Administered 2023-06-22: 10 mg via ORAL
  Filled 2023-06-22: qty 2

## 2023-06-22 MED ORDER — INSULIN GLARGINE-YFGN 100 UNIT/ML ~~LOC~~ SOLN
25.0000 [IU] | Freq: Every day | SUBCUTANEOUS | Status: DC
  Administered 2023-06-22 (×2): 25 [IU] via SUBCUTANEOUS
  Filled 2023-06-22 (×3): qty 0.25

## 2023-06-22 MED ORDER — INSULIN ASPART 100 UNIT/ML IJ SOLN
0.0000 [IU] | Freq: Every day | INTRAMUSCULAR | Status: DC
  Administered 2023-06-22 (×2): 2 [IU] via SUBCUTANEOUS

## 2023-06-22 MED ORDER — FLUOXETINE HCL 20 MG PO CAPS
20.0000 mg | ORAL_CAPSULE | Freq: Every day | ORAL | Status: DC
  Administered 2023-06-22 – 2023-06-23 (×2): 20 mg via ORAL
  Filled 2023-06-22 (×2): qty 1

## 2023-06-22 MED ORDER — ACETAMINOPHEN 160 MG/5ML PO SOLN: 650.0000 mg | ORAL | Status: DC | PRN

## 2023-06-22 MED ORDER — ASPIRIN 81 MG PO TBEC
81.0000 mg | DELAYED_RELEASE_TABLET | Freq: Every day | ORAL | Status: DC
  Administered 2023-06-22 – 2023-06-23 (×2): 81 mg via ORAL
  Filled 2023-06-22 (×2): qty 1

## 2023-06-22 MED ORDER — INFLUENZA VIRUS VACC SPLIT PF (FLUZONE) 0.5 ML IM SUSY
0.5000 mL | PREFILLED_SYRINGE | INTRAMUSCULAR | Status: AC
  Administered 2023-06-23: 0.5 mL via INTRAMUSCULAR
  Filled 2023-06-22: qty 0.5

## 2023-06-22 MED ORDER — STROKE: EARLY STAGES OF RECOVERY BOOK: Freq: Once | Status: AC

## 2023-06-22 MED ORDER — ACETAMINOPHEN 650 MG RE SUPP: 650.0000 mg | RECTAL | Status: DC | PRN

## 2023-06-22 MED ORDER — SENNOSIDES-DOCUSATE SODIUM 8.6-50 MG PO TABS: 1.0000 | ORAL_TABLET | Freq: Every evening | ORAL | Status: DC | PRN

## 2023-06-22 MED ORDER — MIRABEGRON ER 25 MG PO TB24
25.0000 mg | ORAL_TABLET | Freq: Every day | ORAL | Status: DC
  Administered 2023-06-22 – 2023-06-23 (×2): 25 mg via ORAL
  Filled 2023-06-22 (×2): qty 1

## 2023-06-22 MED ORDER — ACETAMINOPHEN 325 MG PO TABS: 650.0000 mg | ORAL_TABLET | ORAL | Status: DC | PRN

## 2023-06-22 MED ORDER — INSULIN ASPART 100 UNIT/ML IJ SOLN
0.0000 [IU] | Freq: Three times a day (TID) | INTRAMUSCULAR | Status: DC
  Administered 2023-06-22 (×3): 3 [IU] via SUBCUTANEOUS
  Administered 2023-06-23: 2 [IU] via SUBCUTANEOUS
  Administered 2023-06-23: 3 [IU] via SUBCUTANEOUS

## 2023-06-22 MED ORDER — ONDANSETRON HCL 4 MG/2ML IJ SOLN: 4.0000 mg | Freq: Four times a day (QID) | INTRAMUSCULAR | Status: DC | PRN

## 2023-06-22 NOTE — Progress Notes (Signed)
Date and time results received: 06/22/23 1517 (use smartphrase ".now" to insert current time)  Test: ABG Critical Value: PCO2 80  Name of Provider Notified: Dr. Alanda Slim  Orders Received? Or Actions Taken?: Notified Dr. Alanda Slim awaiting orders if any

## 2023-06-22 NOTE — Plan of Care (Signed)
  Problem: Acute Rehab OT Goals (only OT should resolve) Goal: Pt. Will Perform Grooming Flowsheets (Taken 06/22/2023 1034) Pt Will Perform Grooming:  with modified independence  sitting Goal: Pt. Will Perform Upper Body Dressing Flowsheets (Taken 06/22/2023 1034) Pt Will Perform Upper Body Dressing:  with modified independence  sitting Goal: Pt. Will Perform Lower Body Dressing Flowsheets (Taken 06/22/2023 1034) Pt Will Perform Lower Body Dressing:  with contact guard assist  sitting/lateral leans Goal: Pt. Will Transfer To Toilet Flowsheets (Taken 06/22/2023 1034) Pt Will Transfer to Toilet:  with modified independence  ambulating Goal: Pt/Caregiver Will Perform Home Exercise Program Flowsheets (Taken 06/22/2023 1034) Pt/caregiver will Perform Home Exercise Program:  Increased ROM  Increased strength  Right Upper extremity  Independently  Edel Rivero OT, MOT

## 2023-06-22 NOTE — Assessment & Plan Note (Signed)
-   Continue Zyprexa, Prozac, Trileptal

## 2023-06-22 NOTE — Assessment & Plan Note (Signed)
Continue statin. 

## 2023-06-22 NOTE — TOC Initial Note (Signed)
Transition of Care [facility]) - Initial/Assessment Note    Patient Details  Name: [name] MRN: [id number] Date of Birth: [birth year]  [facility] CM/SW Contact:    Villa Herb, LCSWA Phone Number: 06/22/2023, 12:56 PM  Clinical Narrative:                 TOC noted that PT is recommending HH PT for pt at D/C. CSW spoke to pt who is agreeable to this being set up. CSW spoke to [place] with [facility] who states they can accept [facility] PT/OT referral. CSW requested that MD place [facility] PT/OT orders. TOC to follow.   Expected Discharge Plan: Home w Home Health Services Barriers to Discharge: Barriers Resolved   Patient Goals and CMS Choice Patient states their goals for this hospitalization and ongoing recovery are:: from home CMS Medicare.gov Compare Post Acute Care list provided to:: Patient Choice offered to / list presented to : Patient      Expected Discharge Plan and Services In-house Referral: Clinical Social Work Discharge Planning Services: CM Consult Post Acute Care Choice: Home Health Living arrangements for the past 2 months: Single Family Home                           HH Arranged: PT, OT HH Agency: [facility] Date HH Agency Contacted: 06/22/23   Representative spoke with at [facility] Agency: Morrie Sheldon  Prior Living Arrangements/Services Living arrangements for the past 2 months: Single Family Home Lives with:: Spouse Patient language and need for interpreter reviewed:: Yes Do you feel safe going back to the place where you live?: Yes      Need for Family Participation in Patient Care: Yes (Comment) Care giver support system in place?: Yes (comment)   Criminal Activity/Legal Involvement Pertinent to Current Situation/Hospitalization: No - Comment as needed  Activities of Daily Living   ADL Screening (condition at time of admission) Does the patient have a NEW difficulty with bathing/dressing/toileting/self-feeding that is expected  to last >3 days?: No Does the patient have a NEW difficulty with getting in/out of bed, walking, or climbing stairs that is expected to last >3 days?: Yes (Initiates electronic notice to provider for possible PT consult) Does the patient have a NEW difficulty with communication that is expected to last >3 days?: No Is the patient deaf or have difficulty hearing?: No Does the patient have difficulty seeing, even when wearing glasses/contacts?: No Does the patient have difficulty concentrating, remembering, or making decisions?: Yes  Permission Sought/Granted                  Emotional Assessment Appearance:: Appears stated age Attitude/Demeanor/Rapport: Engaged Affect (typically observed): Accepting   Alcohol / Substance Use: Not Applicable Psych Involvement: No (comment)  Admission diagnosis:  Dizziness [R42] Right arm weakness [R29.898] Right sided weakness [R53.1] Patient Active Problem List   Diagnosis Date Noted   Right sided weakness 06/21/2023   Acute respiratory failure with hypoxia and hypercarbia (HCC) 04/15/2023   Stage 3a chronic kidney disease (CKD) (HCC) 04/15/2023   Elevated brain natriuretic peptide (BNP) level 03/30/2023   Elevated troponin 03/30/2023   COPD (chronic obstructive pulmonary disease) (HCC) 03/30/2023   Mixed hyperlipidemia 03/30/2023   Acute psychosis (HCC) 02/26/2023   Morbid obesity with BMI of 50.0-59.9, adult (HCC) 02/26/2023   Bipolar disorder, current episode manic severe with psychotic features (HCC) 02/26/2023   Agitation 07/28/2022   Closed fracture dislocation of  joint of right shoulder girdle    Traumatic closed displaced fracture of right shoulder with anterior dislocation 04/25/2021   Acute on chronic respiratory failure with hypoxia and hypercapnia (HCC) 04/23/2021   Acute renal failure superimposed on stage 3a chronic kidney disease (HCC) 04/22/2021   Fall    Anterior shoulder dislocation, right, initial encounter    Essential  hypertension 04/22/2018   Hypercalcemia 04/22/2018   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 [place] [address] Phone: [phone] Fax: [phone]     Social Determinants of Health (SDOH) Social History: SDOH Screenings   Food Insecurity: Food Insecurity Present (06/21/2023)  Housing: Low Risk  (06/21/2023)  Recent Concern: Housing - High Risk (04/01/2023)  Transportation Needs: No Transportation Needs (06/21/2023)  Utilities: Not At Risk (06/21/2023)  Tobacco Use: Low Risk  (04/15/2023)   SDOH Interventions:     Readmission Risk Interventions    04/16/2023    4:15 PM 04/02/2023    1:43 PM 04/25/2021    1:27 PM  Readmission Risk Prevention Plan  Transportation Screening Complete Complete Complete  Home Care Screening   Complete  Medication Review (RN CM)   Complete  HRI or Home Care Consult Complete Complete   Social Work Consult for Recovery Care Planning/Counseling Complete Complete   Palliative Care Screening Not Applicable Not Applicable   Medication Review Oceanographer) Complete Complete

## 2023-06-22 NOTE — Assessment & Plan Note (Signed)
-   Creatinine is at baseline 1.38 - Continue to monitor

## 2023-06-22 NOTE — Progress Notes (Signed)
PROGRESS NOTE  [name] QIO:[id number] DOB: 05-Jun-1966   PCP: [facility]  Patient is from: Home.  Lives with husband.  DOA: 06/21/2023 LOS: 0  Chief complaints Chief Complaint  Patient presents with   Dizziness   Urinary Frequency     Brief Narrative / Interim history: 57 year old F with PMH of COPD/chronic hypoxic RF, DM-2, CKD-3A, Parkinson's disease, seizure disorder, HTN, HLD, anxiety, bipolar disorder and morbid obesity presenting with right-sided weakness.  Reportedly dropping stuff from her right hand and stumbling due to right leg weakness.  Vital stable.  BMP and CBC without acute finding.  CT head, UA and UDS without acute finding.  MRI brain and echocardiogram ordered.  Patient was admitted for TIA/CVA workup   Subjective: Seen and examined earlier this morning after she returned from MRI.  Patient is not a great historian and difficult to understand.  She reports dropping steps due to weakness in the right hand and also difficulty walking due to right leg weakness.  She uses walker at baseline.  Lives with husband.  She denies numbness or tingling.  Objective: Vitals:   06/21/23 2230 06/21/23 2311 06/21/23 2315 06/22/23 0008  BP: 108/84 103/62 108/61 122/64  Pulse: 68 69 71 73  Resp: (!) 21 18 (!) 21 20  Temp:   98 F (36.7 C) 98.9 F (37.2 C)  TempSrc:   Oral   SpO2: 96% 95% 93% 96%  Weight:   (!) 154.1 kg (!) 154.1 kg    Examination:  GENERAL: No apparent distress.  Nontoxic. HEENT: MMM.  Vision and hearing grossly intact.  NECK: Supple.  No apparent JVD.  RESP:  No IWOB.  Fair aeration bilaterally. CVS:  RRR. Heart sounds normal.  ABD/GI/GU: BS+. Abd soft, NTND.  MSK/EXT:  Moves extremities. No apparent deformity. No edema.  SKIN: no apparent skin lesion or wound NEURO: Awake, alert and oriented appropriately.  Motor 4/5 in left extremities and 5/5 elsewhere.  PSYCH: Calm. Normal affect.   Procedures:   None  Microbiology summarized: MRSA PCR screen negative Urine culture pending  Assessment and plan: Principal Problem:   Right sided weakness Active Problems:   Uncontrolled type 2 diabetes mellitus with hyperglycemia, with long-term current use of insulin (HCC)   Adjustment disorder with mixed disturbance of emotions and conduct   Essential hypertension   COPD (chronic obstructive pulmonary disease) (HCC)   Mixed hyperlipidemia   Stage 3a chronic kidney disease (CKD) (HCC)  Right sided weakness: Reportedly dropping stuff from right hand and stumbling due to right leg weakness.  Surprisingly his exam shows some weakness in left side.  Does not seem to have other neurosymptoms.  Vitals, basic labs, CT head, UA and UDS unrevealing.  LDL 59.  I wonder if this is hypercapnia from possible OSA/OHS and oxygen use. -Check VBG -Follow MRI and TTE -PT/OT   CKD-3A: Stable Recent Labs    04/03/23 0401 04/04/23 0450 04/05/23 1042 04/15/23 0800 04/15/23 0809 04/16/23 0311 04/17/23 0533 04/18/23 0312 06/21/23 1803 06/22/23 0428  BUN 40* 34* 31* 45* 40* 51* 51* 49* 41* 40*  CREATININE 1.16* 1.16* 1.26* 1.49* 1.60* 1.50* 1.49* 1.41* 1.38* 1.34*  -Continue monitoring    Chronic COPD: Stable. -May consider minimal oxygen due to risk for hypercapnia  Possible OHS/OSA-on CPAP.  Uses oxygen at baseline -Check VBG -Consider minimal oxygen to keep saturation above 88%   Essential hypertension: Normotensive -Continue holding lisinopril -Continue propranolol -Monitor   Anxiety/bipolar disorder: Stable -Continue  Zyprexa, Prozac, Trileptal   Uncontrolled IDDM-2 with hyperglycemia: A1c 9.9% Recent Labs  Lab 06/21/23 1951 06/22/23 0328 06/22/23 0811 06/22/23 1217  GLUCAP 167* 228* 153* 154*  -Continue current insulin regimen  Generalized weakness complicated by morbid obesity and respiratory failure -PT/OT   Morbid obesity Body mass index is 53.21 kg/m. -Encourage  lifestyle change to lose weight -PT/OT          DVT prophylaxis:  heparin injection 5,000 Units Start: 06/22/23 0600 SCD's Start: 06/22/23 0011  Code Status: Full code Family Communication: None at bedside Level of care: Telemetry Status is: Observation The patient will require care spanning > 2 midnights and should be moved to inpatient because: TIA/CVA workup   Final disposition: Home Consultants:  None  35 minutes with more than 50% spent in reviewing records, counseling patient/family and coordinating care.   Sch Meds:  Scheduled Meds:  aspirin EC  81 mg Oral Daily   FLUoxetine  20 mg Oral Daily   heparin  5,000 Units Subcutaneous Q8H   [START ON 06/23/2023] influenza vac split trivalent PF  0.5 mL Intramuscular Tomorrow-1000   insulin aspart  0-15 Units Subcutaneous TID WC   insulin aspart  0-5 Units Subcutaneous QHS   insulin glargine-yfgn  25 Units Subcutaneous QHS   isosorbide mononitrate  30 mg Oral Daily   mirabegron ER  25 mg Oral Daily   OLANZapine  10 mg Oral QHS   Oxcarbazepine  300 mg Oral BID   propranolol  60 mg Oral Daily   rosuvastatin  20 mg Oral Daily   Continuous Infusions:  sodium chloride 100 mL/hr at 06/22/23 0323   PRN Meds:.acetaminophen **OR** acetaminophen (TYLENOL) oral liquid 160 mg/5 mL **OR** acetaminophen, albuterol, ondansetron (ZOFRAN) IV, senna-docusate  Antimicrobials: Anti-infectives (From admission, onward)    None        I have personally reviewed the following labs and images: CBC: Recent Labs  Lab 06/21/23 1803 06/22/23 0029 06/22/23 0428  WBC 4.5 4.8 5.0  HGB 11.0* 11.1* 10.8*  HCT 37.4 38.5 38.2  MCV 94.9 95.8 96.7  PLT 161 167 152   BMP &GFR Recent Labs  Lab 06/21/23 1803 06/22/23 0428  NA 139 140  K 4.9 5.1  CL 99 100  CO2 36* 36*  GLUCOSE 223* 215*  BUN 41* 40*  CREATININE 1.38* 1.34*  CALCIUM 8.6* 8.7*  MG  --  2.2   Estimated Creatinine Clearance: 71.2 mL/min (A) (by C-G formula based  on SCr of 1.34 mg/dL (H)). Liver & Pancreas: Recent Labs  Lab 06/22/23 0428  AST 16  ALT 23  ALKPHOS 104  BILITOT 0.4  PROT 6.0*  ALBUMIN 3.1*   No results for input(s): "LIPASE", "AMYLASE" in the last 168 hours. No results for input(s): "AMMONIA" in the last 168 hours. Diabetic: Recent Labs    06/22/23 0029  HGBA1C 9.9*   Recent Labs  Lab 06/21/23 1951 06/22/23 0328 06/22/23 0811 06/22/23 1217  GLUCAP 167* 228* 153* 154*   Cardiac Enzymes: No results for input(s): "CKTOTAL", "CKMB", "CKMBINDEX", "TROPONINI" in the last 168 hours. No results for input(s): "PROBNP" in the last 8760 hours. Coagulation Profile: No results for input(s): "INR", "PROTIME" in the last 168 hours. Thyroid Function Tests: No results for input(s): "TSH", "T4TOTAL", "FREET4", "T3FREE", "THYROIDAB" in the last 72 hours. Lipid Profile: Recent Labs    06/22/23 0428  CHOL 133  HDL 50  LDLCALC 59  TRIG 118  CHOLHDL 2.7   Anemia Panel: No results for  input(s): "VITAMINB12", "FOLATE", "FERRITIN", "TIBC", "IRON", "RETICCTPCT" in the last 72 hours. Urine analysis:    Component Value Date/Time   COLORURINE YELLOW 06/21/2023 1815   APPEARANCEUR HAZY (A) 06/21/2023 1815   LABSPEC 1.013 06/21/2023 1815   PHURINE 5.0 06/21/2023 1815   GLUCOSEU 50 (A) 06/21/2023 1815   HGBUR MODERATE (A) 06/21/2023 1815   BILIRUBINUR NEGATIVE 06/21/2023 1815   KETONESUR NEGATIVE 06/21/2023 1815   PROTEINUR 100 (A) 06/21/2023 1815   UROBILINOGEN 0.2 04/11/2014 1927   NITRITE NEGATIVE 06/21/2023 1815   LEUKOCYTESUR NEGATIVE 06/21/2023 1815   Sepsis Labs: Invalid input(s): "PROCALCITONIN", "LACTICIDVEN"  Microbiology: Recent Results (from the past 240 hour(s))  MRSA Next Gen by PCR, Nasal     Status: None   Collection Time: 06/22/23 10:45 AM   Specimen: Nasal Mucosa; Nasal Swab  Result Value Ref Range Status   MRSA by PCR Next Gen NOT DETECTED NOT DETECTED Final    Comment: (NOTE) The GeneXpert MRSA Assay  (FDA approved for NASAL specimens only), is one component of a comprehensive MRSA colonization surveillance program. It is not intended to diagnose MRSA infection nor to guide or monitor treatment for MRSA infections. Test performance is not FDA approved in patients less than 36 years old. Performed at [facility], [address] [place], [place] [address]     Radiology Studies: CT Head Wo Contrast  Result Date: 06/21/2023 CLINICAL DATA:  Dizziness for 3 days, dropping things out of right arm/hand for the past few days. Neuro deficit, acute, stroke suspected. EXAM: CT HEAD WITHOUT CONTRAST TECHNIQUE: Contiguous axial images were obtained from the base of the skull through the vertex without intravenous contrast. RADIATION DOSE REDUCTION: This exam was performed according to the departmental dose-optimization program which includes automated exposure control, adjustment of the mA and/or kV according to patient size and/or use of iterative reconstruction technique. COMPARISON:  07/28/2022. FINDINGS: Brain: No acute intracranial hemorrhage, midline shift or mass effect. No extra-axial fluid collection. Mild diffuse atrophy is noted. Periventricular white matter hypodensities are present bilaterally. No hydrocephalus. Vascular: No hyperdense vessel or unexpected calcification. Skull: Normal. Negative for fracture or focal lesion. Sinuses/Orbits: No acute finding. Other: Increased density is noted in the scalp as compared to the prior exam, possible edema or contusion. IMPRESSION: 1. No acute intracranial process. 2. Atrophy with chronic microvascular ischemic changes. Electronically Signed   By: Thornell Sartorius M.D.   On: 06/21/2023 20:59   DG Chest Port 1 View  Result Date: 06/21/2023 CLINICAL DATA:  Shortness of breath EXAM: PORTABLE CHEST 1 VIEW COMPARISON:  04/15/2023 FINDINGS: Stable cardiomegaly. Aortic atherosclerotic calcification. Low lung volumes with coarse bronchovascular markings similar to  prior. Bibasilar atelectasis. No pleural effusion or pneumothorax. No displaced rib fracture. IMPRESSION: Low lung volumes with basilar atelectasis.  Cardiomegaly. Electronically Signed   By: Minerva Fester M.D.   On: 06/21/2023 20:30      Willisha Sligar T. Sherlonda Flater [facility]  If 7PM-7AM, please contact night-coverage [email or web address] 06/22/2023, 2:25 PM

## 2023-06-22 NOTE — Assessment & Plan Note (Signed)
-   55 units of basal insulin at baseline - Continue reduced rate here at 25 units - Sliding scale coverage - Last hemoglobin A1c was uncontrolled at 10.3 - Continue to monitor CBGs

## 2023-06-22 NOTE — Assessment & Plan Note (Signed)
-   Hold lisinopril in the setting of soft blood pressure down to 94/49 - Continuing propranolol - Continue to monitor

## 2023-06-22 NOTE — Evaluation (Addendum)
Occupational Therapy Evaluation Patient Details Name: [name] MRN: [id number] DOB: [birth year] Today's Date: 06/22/2023   History of Present Illness [name] is a 57 y.o. female with medical history significant of psychiatric disorder, COPD, diabetes mellitus type 2, hypertension, Parkinson's disease, seizures, anxiety/panic attacks, more presents the ED with a chief complaint of right-sided weakness.  Patient reports that she has had right-sided weakness and has been dropping stuff from her right hand.  She reports she also was stumbling around because her right leg felt weak.  She denies any numbness.  She reports associated headache in her bilateral temples.  She reports her headaches been so bad she could not sleep.  She has also had dizziness.  The symptoms have been going on for 3 days.  On exam she actually feels weaker in the left upper extremity, which she reports is chronically weak but her right upper extremity feels weaker than normal to her.  Patient is overall a poor historian.  One of the things she complains about is urinary frequency but as she describes it it sounds more like an overflow incontinence, or stress incontinence, but it is difficult to tell due to poor history provided.  Her procalcitonin is undetectable and this is a chronic issue, so urinary tract infection is less likely.  Patient has no other complaints at this time. (per DO)   Clinical Impression   Pt agreeable to OT and PT co-evaluation. Pt reports needing assist for much of ADL's at baseline. Pt uses RW for ambulation and reports 24/7 assist at home from spouse. Today pt was impulsive, which was the primary reason for occasional min A while ambulating due to difficulty managing IV. Pt used RW for transfer. Pt is assisted for lower body dressing at baseline. Weakness noted in R UE compared to L, but unsure of baseline deficits. Vision is also a deficit area per pt's report, but this appeared to be chronic  based on her description. Pt was taken to MRI by staff at the end of the session. Pt will benefit from continued OT in the hospital and recommended venue below to increase strength, balance, and endurance for safe ADL's.          If plan is discharge home, recommend the following: A little help with walking and/or transfers;A lot of help with bathing/dressing/bathroom;Assistance with cooking/housework;Help with stairs or ramp for entrance;Assist for transportation    Functional Status Assessment  Patient has had a recent decline in their functional status and demonstrates the ability to make significant improvements in function in a reasonable and predictable amount of time.  Equipment Recommendations  None recommended by OT           Precautions / Restrictions Precautions Precautions: Fall Restrictions Weight Bearing Restrictions: No      Mobility Bed Mobility Overal bed mobility: Needs Assistance Bed Mobility: Supine to Sit     Supine to sit: Supervision, HOB elevated     General bed mobility comments: Mild labored effort    Transfers Overall transfer level: Needs assistance Equipment used: Rolling walker (2 wheels) Transfers: Sit to/from Stand, Bed to chair/wheelchair/BSC Sit to Stand: Contact guard assist     Step pivot transfers: Contact guard assist, Min assist     General transfer comment: Impulsive with mobility, milldy unsteady with RW      Balance Overall balance assessment: Needs assistance Sitting-balance support: No upper extremity supported, Feet supported Sitting balance-Leahy Scale: Good Sitting balance - Comments: seated at EOB  Standing balance support: Bilateral upper extremity supported, During functional activity, Reliant on assistive device for balance Standing balance-Leahy Scale: Fair Standing balance comment: using RW                           ADL either performed or assessed with clinical judgement   ADL Overall ADL's  : Needs assistance/impaired     Grooming: Set up;Sitting       Lower Body Bathing: Moderate assistance;Maximal assistance;Sitting/lateral leans   Upper Body Dressing : Set up;Sitting   Lower Body Dressing: Maximal assistance;Sitting/lateral leans   Toilet Transfer: Contact guard assist;Minimal assistance;Rolling walker (2 wheels);Ambulation Toilet Transfer Details (indicate cue type and reason): Ambulating from chair to toilet.         Functional mobility during ADLs: Contact guard assist;Minimal assistance;Rolling walker (2 wheels)       Vision Ability to See in Adequate Light: 2 Moderately impaired Patient Visual Report: Other (comment) (Pt reported poor vision her entire life, but stated she could not see well today as well. Difficult to fully understand pt's baseline vision.) Additional Comments: Unsure; further assessment possibly needed, but pt reports impairments at baseline.     Perception Perception: Not tested       Praxis Praxis: WFL       Pertinent Vitals/Pain Pain Assessment Pain Assessment: No/denies pain     Extremity/Trunk Assessment Upper Extremity Assessment Upper Extremity Assessment: Generalized weakness;RUE deficits/detail RUE Deficits / Details: 3+/5 shoulder flexion mmet; generally weak otherwise. Unsure of baseline weakenss level.   Lower Extremity Assessment Lower Extremity Assessment: Defer to PT evaluation   Cervical / Trunk Assessment Cervical / Trunk Assessment: Normal   Communication Communication Communication: Other (comment) (Pt is difficulty to understand at times; possibly baseline.) Cueing Techniques: Verbal cues   Cognition Arousal: Alert Behavior During Therapy: Anxious, Impulsive Overall Cognitive Status: Within Functional Limits for tasks assessed                                                        Home Living Family/patient expects to be discharged to:: Private residence Living  Arrangements: Spouse/significant other Available Help at Discharge: Available 24 hours/day Type of Home: House Home Access: Ramped entrance     Home Layout: One level     Bathroom Shower/Tub: Walk-in shower             Additional Comments: per chart review and pt report      Prior Functioning/Environment Prior Level of Function : Needs assist       Physical Assist : ADLs (physical)   ADLs (physical): Bathing;Dressing;Toileting;IADLs Mobility Comments: household ambulator using RW ADLs Comments: Assisted by family for bathing dressing and toileting. Likely IADL assist as well.        OT Problem List: Decreased strength;Decreased activity tolerance;Impaired balance (sitting and/or standing);Impaired vision/perception;Obesity      OT Treatment/Interventions: Self-care/ADL training;Therapeutic exercise;Neuromuscular education;Therapeutic activities;Patient/family education;Visual/perceptual remediation/compensation    OT Goals(Current goals can be found in the care plan section) Acute Rehab OT Goals Patient Stated Goal: Get more help. OT Goal Formulation: With patient Time For Goal Achievement: 07/06/23 Potential to Achieve Goals: Good  OT Frequency: Min 1X/week    Co-evaluation PT/OT/SLP Co-Evaluation/Treatment: Yes Reason for Co-Treatment: To address functional/ADL transfers   OT goals addressed during session: ADL's  and self-care      AM-PAC OT "6 Clicks" Daily Activity     Outcome Measure Help from another person eating meals?: None Help from another person taking care of personal grooming?: A Little Help from another person toileting, which includes using toliet, bedpan, or urinal?: A Little Help from another person bathing (including washing, rinsing, drying)?: A Lot Help from another person to put on and taking off regular upper body clothing?: A Little Help from another person to put on and taking off regular lower body clothing?: A Lot 6 Click Score:  17   End of Session Equipment Utilized During Treatment: Rolling walker (2 wheels);Oxygen  Activity Tolerance: Patient tolerated treatment well Patient left: Other (comment) (in transport chair with hospital staff)  OT Visit Diagnosis: Unsteadiness on feet (R26.81);Other abnormalities of gait and mobility (R26.89);Muscle weakness (generalized) (M62.81)                Time: 2841-3244 OT Time Calculation (min): 24 min Charges:  OT General Charges $OT Visit: 1 Visit OT Evaluation $OT Eval Low Complexity: 1 Low  Aunika Kirsten OT, MOT  Danie Chandler 06/22/2023, 10:29 AM

## 2023-06-22 NOTE — Assessment & Plan Note (Addendum)
-   Dropping things and feeling right-sided weakness for 3 days - Associated dizziness - CT head shows no acute intracranial process.  Atrophy with chronic microvascular ischemic changes present - MRI in the a.m. - Monitor on telemetry, echo as part of stroke workup - Continue to monitor

## 2023-06-22 NOTE — Assessment & Plan Note (Signed)
-   On baseline oxygen requirement - Auto PEEP breathing - Albuterol as needed - Continue to monitor

## 2023-06-22 NOTE — Progress Notes (Signed)
SLP Cancellation Note  Patient Details Name: [name] MRN: [id number] DOB: March 10, 1966   Cancelled treatment:       Reason Eval/Treat Not Completed: Other (comment) (SLE requested, head CT is negative and MRI is pending, Pt passed the [place]. SLP will follow for SLE if needed, as schedule permits.)  Thank you,  Havery Moros, [name] [phone]  [name] 06/22/2023, 8:08 AM

## 2023-06-22 NOTE — Inpatient Diabetes Management (Signed)
Inpatient Diabetes Program Recommendations  AACE/ADA: New Consensus Statement on Inpatient Glycemic Control (2015)  Target Ranges:  Prepandial:   less than 140 mg/dL      Peak postprandial:   less than 180 mg/dL (1-2 hours)      Critically ill patients:  140 - 180 mg/dL   Lab Results  Component Value Date   GLUCAP 153 (H) 06/22/2023   HGBA1C 9.9 (H) 06/22/2023    Review of Glycemic Control  Latest Reference Range & Units 02/26/23 16:20 06/22/23 00:29  Hemoglobin A1C 4.8 - 5.6 % 10.3 (H) 9.9 (H)    Latest Reference Range & Units 06/21/23 19:51 06/22/23 03:28 06/22/23 08:11  Glucose-Capillary 70 - 99 mg/dL 409 (H) 811 (H) 914 (H)   Diabetes history: DM 2 Outpatient Diabetes medications: lantus 55 units bid, Novolog 0-20 units tid Current orders for Inpatient glycemic control:  Semglee 25 units Novolog 0-15 units tid + hs  Called husband on phone and left voicemail to inquire about pt A1c levels and glucose control at home.   Called into the pt room. She reports needing 24 hour care at home and her husband assists her.  Thanks,  Christena Deem RN, MSN, BC-ADM Inpatient Diabetes Coordinator Team Pager [phone] (8a-5p)

## 2023-06-22 NOTE — H&P (Signed)
History and Physical    Patient: [name]:[id number] DOB: [birth year] DOA: 06/21/2023 DOS: the patient was seen and examined on 06/22/2023 PCP: [facility]  Patient coming from: Home  Chief Complaint:  Chief Complaint  Patient presents with   Dizziness   Urinary Frequency   HPI: [name] is a 57 y.o. female with medical history significant of psychiatric disorder, COPD, diabetes mellitus type 2, hypertension, Parkinson's disease, seizures, anxiety/panic attacks, more presents the ED with a chief complaint of right-sided weakness.  Patient reports that she has had right-sided weakness and has been dropping stuff from her right hand.  She reports she also was stumbling around because her right leg felt weak.  She denies any numbness.  She reports associated headache in her bilateral temples.  She reports her headaches been so bad she could not sleep.  She has also had dizziness.  The symptoms have been going on for 3 days.  On exam she actually feels weaker in the left upper extremity, which she reports is chronically weak but her right upper extremity feels weaker than normal to her.  Patient is overall a poor historian.  One of the things she complains about is urinary frequency but as she describes it it sounds more like an overflow incontinence, or stress incontinence, but it is difficult to tell due to poor history provided.  Her procalcitonin is undetectable and this is a chronic issue, so urinary tract infection is less likely.  Patient has no other complaints at this time.  Patient does not smoke, does not drink.  She is full code. Review of Systems: As mentioned in the history of present illness. All other systems reviewed and are negative. Past Medical History:  Diagnosis Date   Agitation 07/28/2022   Anxiety    COPD (chronic obstructive pulmonary disease) (HCC)    Diabetes mellitus    Fear of    falling   Hemophilia (HCC)    Hypertension     Panic attacks    Parkinson's disease    Seizures (HCC)    Past Surgical History:  Procedure Laterality Date   ABDOMINAL HYSTERECTOMY     OOPHORECTOMY     SHOULDER CLOSED REDUCTION Right 04/22/2021   Procedure: CLOSED REDUCTION SHOULDER;  Surgeon: Vickki Hearing, MD;  Location: [facility];  Service: Orthopedics;  Laterality: Right;   SHOULDER CLOSED REDUCTION Right 04/25/2021   Procedure: CLOSED REDUCTION SHOULDER;  Surgeon: Vickki Hearing, MD;  Location: [facility];  Service: Orthopedics;  Laterality: Right;   Social History:  reports that she has never smoked. She has never used smokeless tobacco. She reports that she does not drink alcohol and does not use drugs.  Allergies  Allergen Reactions   Bee Venom Hives    No family history on file.  Prior to Admission medications   Medication Sig Start Date End Date Taking? Authorizing Provider  aspirin EC 81 MG tablet Take 81 mg by mouth daily.   Yes [provider]  Cholecalciferol (VITAMIN D3) 50 MCG (2000 UT) capsule Take 2,000 Units by mouth daily.   Yes [provider]  FLUoxetine (PROZAC) 20 MG capsule Take 20 mg by mouth daily.   Yes [provider]  insulin aspart (NOVOLOG) 100 UNIT/ML injection CBG < 70: implement hypoglycemia protocol CBG 70 - 120: 0 units CBG 121 - 150: 3 units CBG 151 - 200: 4 units CBG 201 - 250: 7 units CBG 251 - 300: 11  units CBG 301 - 350: 15 units CBG 351 - 400: 20 units Patient taking differently: Inject 3-20 Units into the skin 4 (four) times daily -  with meals and at bedtime. CBG < 70: implement hypoglycemia protocol CBG 70 - 120: 0 units CBG 121 - 150: 3 units      CBG 151 - 200: 4 units CBG 201 - 250: 7 units CBG 251 - 300: 11 units CBG 301 - 350: 15 units CBG 351 - 400: 20 units 04/20/14  Yes Kathlen Mody, MD  isosorbide mononitrate (IMDUR) 30 MG 24 hr tablet Take 1 tablet (30 mg total) by mouth daily. 07/08/22  Yes Terrilee Files, MD  LANTUS SOLOSTAR 100  UNIT/ML Solostar Pen Inject 55 Units into the skin 2 (two) times daily. 02/13/23  Yes [provider]  lisinopril (ZESTRIL) 2.5 MG tablet Take 2.5 mg by mouth daily. 02/03/23  Yes [provider]  loratadine (CLARITIN) 10 MG tablet Take 10 mg by mouth daily.   Yes [provider]  MYRBETRIQ 25 MG TB24 tablet Take 25 mg by mouth daily. 02/03/23  Yes [provider]  OLANZapine (ZYPREXA) 10 MG tablet Take 1 tablet (10 mg total) by mouth at bedtime. 04/05/23  Yes Tat, Onalee Hua, MD  Oxcarbazepine (TRILEPTAL) 300 MG tablet Take 1 tablet (300 mg total) by mouth 2 (two) times daily. 04/05/23  Yes Tat, Onalee Hua, MD  propranolol (INDERAL) 60 MG tablet Take 1 tablet (60 mg total) by mouth daily. On an empty stomach 07/08/22  Yes Terrilee Files, MD  rosuvastatin (CRESTOR) 20 MG tablet Take 20 mg by mouth daily. 02/03/23  Yes [provider]    Physical Exam: Vitals:   06/21/23 2230 06/21/23 2311 06/21/23 2315 06/22/23 0008  BP: 108/84 103/62 108/61 122/64  Pulse: 68 69 71 73  Resp: (!) 21 18 (!) 21 20  Temp:   98 F (36.7 C) 98.9 F (37.2 C)  TempSrc:   Oral   SpO2: 96% 95% 93% 96%  Weight:   (!) 154.1 kg (!) 154.1 kg   1.  General: Patient lying supine in bed,  no acute distress   2. Psychiatric: Alert and oriented x 3, mood and behavior normal for situation, pleasant and cooperative with exam   3. Neurologic: Speech chronically slurred and language is normal, face is symmetric, moves all 4 extremities voluntarily, left upper extremity weak compared to right upper extremity, normal sensation, equal strength in the lower extremities  4. HEENMT:  Head is atraumatic, normocephalic, pupils reactive to light, neck is supple, trachea is midline, mucous membranes are moist   5. Respiratory : Lungs are clear to auscultation bilaterally without wheezing, rhonchi, rales, no cyanosis, no increase in work of breathing or accessory muscle use   6. Cardiovascular  : Heart rate normal, rhythm is regular, no murmurs, rubs or gallops, positive for peripheral edema, peripheral pulses palpated   7. Gastrointestinal:  Abdomen is soft, nondistended, nontender to palpation bowel sounds active, no masses or organomegaly palpated   8. Skin:  Skin is warm, dry and intact without rashes, acute lesions, or ulcers on limited exam   9.Musculoskeletal:  No acute deformities or trauma, no asymmetry in tone, positive for peripheral edema, peripheral pulses palpated, no tenderness to palpation in the extremities  Data Reviewed: In the ED Temp 98.2-99.7, heart rate 67-78, respiratory rate 13-18, blood pressure 90/49-149/120, satting 91-96% on 4 L nasal cannula which is her baseline oxygen requirement Borderline leukopenia with a white  blood cell count of 4.5, hemoglobin stable at 11 Chemistry reveals a hyperglycemia at 223 Troponin 8, 9 UA not indicative of UTI CT head shows no acute intracranial process, atrophy with chronic microvascular ischemic changes. Chest x-ray shows low lung volumes and cardiomegaly EKG shows a heart rate of 73, sinus rhythm, QTc 453 Admission requested due to subjective right sided weakness for CVA workup  Assessment and Plan: * Right sided weakness - Dropping things and feeling right-sided weakness for 3 days - Associated dizziness - CT head shows no acute intracranial process.  Atrophy with chronic microvascular ischemic changes present - MRI in the a.m. - Monitor on telemetry, echo as part of stroke workup - Continue to monitor  Stage 3a chronic kidney disease (CKD) (HCC) - Creatinine is at baseline 1.38 - Continue to monitor  Mixed hyperlipidemia - Continue statin  COPD (chronic obstructive pulmonary disease) (HCC) - On baseline oxygen requirement - Auto PEEP breathing - Albuterol as needed - Continue to monitor  Essential hypertension - Hold lisinopril in the setting of soft blood pressure down to 94/49 - Continuing  propranolol - Continue to monitor  Adjustment disorder with mixed disturbance of emotions and conduct - Continue Zyprexa, Prozac, Trileptal  Uncontrolled type 2 diabetes mellitus with hyperglycemia, with long-term current use of insulin (HCC) - 55 units of basal insulin at baseline - Continue reduced rate here at 25 units - Sliding scale coverage - Last hemoglobin A1c was uncontrolled at 10.3 - Continue to monitor CBGs      Advance Care Planning:   Code Status: Full Code  Consults: None at this time, may need neuro consult pending results of MRI  Family Communication: No family at bedside  Severity of Illness: The appropriate patient status for this patient is OBSERVATION. Observation status is judged to be reasonable and necessary in order to provide the required intensity of service to ensure the patient's safety. The patient's presenting symptoms, physical exam findings, and initial radiographic and laboratory data in the context of their medical condition is felt to place them at decreased risk for further clinical deterioration. Furthermore, it is anticipated that the patient will be medically stable for discharge from the hospital within 2 midnights of admission.   Author: Lilyan Gilford, DO 06/22/2023 3:00 AM  For on call review [email or web address].

## 2023-06-22 NOTE — Plan of Care (Signed)
  Problem: Acute Rehab PT Goals(only PT should resolve) Goal: Pt Will Go Supine/Side To Sit Outcome: Progressing Flowsheets (Taken 06/22/2023 1232) Pt will go Supine/Side to Sit: with modified independence Goal: Patient Will Transfer Sit To/From Stand Outcome: Progressing Flowsheets (Taken 06/22/2023 1232) Patient will transfer sit to/from stand:  with modified independence  with supervision Goal: Pt Will Transfer Bed To Chair/Chair To Bed Outcome: Progressing Flowsheets (Taken 06/22/2023 1232) Pt will Transfer Bed to Chair/Chair to Bed:  with modified independence  with supervision Goal: Pt Will Ambulate Outcome: Progressing Flowsheets (Taken 06/22/2023 1232) Pt will Ambulate:  50 feet  with supervision  with contact guard assist  with rolling walker   12:32 PM, 06/22/23 Ocie Bob, MPT Physical Therapist with [facility] [phone] office [phone] mobile phone

## 2023-06-22 NOTE — Evaluation (Signed)
Physical Therapy Evaluation Patient Details Name: [name] MRN: [id number] DOB: [birth year] Today's Date: 06/22/2023  History of Present Illness  [name] is a 57 y.o. female with medical history significant of psychiatric disorder, COPD, diabetes mellitus type 2, hypertension, Parkinson's disease, seizures, anxiety/panic attacks, more presents the ED with a chief complaint of right-sided weakness.  Patient reports that she has had right-sided weakness and has been dropping stuff from her right hand.  She reports she also was stumbling around because her right leg felt weak.  She denies any numbness.  She reports associated headache in her bilateral temples.  She reports her headaches been so bad she could not sleep.  She has also had dizziness.  The symptoms have been going on for 3 days.  On exam she actually feels weaker in the left upper extremity, which she reports is chronically weak but her right upper extremity feels weaker than normal to her.  Patient is overall a poor historian.  One of the things she complains about is urinary frequency but as she describes it it sounds more like an overflow incontinence, or stress incontinence, but it is difficult to tell due to poor history provided.  Her procalcitonin is undetectable and this is a chronic issue, so urinary tract infection is less likely.  Patient has no other complaints at this time.   Clinical Impression  Patient demonstrates fair/good return for bed mobility, transferring to/from commode in bathroom and wheelchair and and limited to ambulating in room cue to c/o fatigue and generalized weakness.  Patient on 4 LPM during activities and tolerated sitting up in wheelchair to be taken to procedure by nursing staff.  Patient will benefit from continued skilled physical therapy in hospital and recommended venue below to increase strength, balance, endurance for safe ADLs and gait.         If plan is discharge home, recommend the  following: A little help with walking and/or transfers;A little help with bathing/dressing/bathroom;Help with stairs or ramp for entrance;Assistance with cooking/housework   Can travel by private vehicle        Equipment Recommendations None recommended by PT  Recommendations for Other Services       Functional Status Assessment Patient has had a recent decline in their functional status and demonstrates the ability to make significant improvements in function in a reasonable and predictable amount of time.     Precautions / Restrictions Precautions Precautions: Fall Restrictions Weight Bearing Restrictions: No      Mobility  Bed Mobility Overal bed mobility: Needs Assistance Bed Mobility: Supine to Sit     Supine to sit: Supervision, HOB elevated     General bed mobility comments: slightly labored movement    Transfers Overall transfer level: Needs assistance Equipment used: Rolling walker (2 wheels) Transfers: Sit to/from Stand, Bed to chair/wheelchair/BSC Sit to Stand: Contact guard assist   Step pivot transfers: Contact guard assist, Min assist       General transfer comment: repeated verbal cues for safety due to impulsive behavior    Ambulation/Gait Ambulation/Gait assistance: Contact guard assist Gait Distance (Feet): 18 Feet Assistive device: Rolling walker (2 wheels) Gait Pattern/deviations: Decreased step length - right, Decreased step length - left, Decreased stride length Gait velocity: decreased     General Gait Details: slow labored cadence without loss of balance, limited mostly due to fatigue and apprehension  Acupuncturist  Bed    Modified Rankin (Stroke Patients Only)       Balance Overall balance assessment: Needs assistance Sitting-balance support: Feet supported, No upper extremity supported Sitting balance-Leahy Scale: Good Sitting balance - Comments: seated at EOB   Standing balance  support: Bilateral upper extremity supported, During functional activity, Reliant on assistive device for balance Standing balance-Leahy Scale: Fair Standing balance comment: using RW                             Pertinent Vitals/Pain Pain Assessment Pain Assessment: No/denies pain    Home Living Family/patient expects to be discharged to:: Private residence Living Arrangements: Spouse/significant other Available Help at Discharge: Available 24 hours/day Type of Home: House Home Access: Ramped entrance       Home Layout: One level Home Equipment: Agricultural consultant (2 wheels) Additional Comments: per chart review and pt report    Prior Function Prior Level of Function : Needs assist       Physical Assist : ADLs (physical);Mobility (physical) Mobility (physical): Bed mobility;Transfers;Gait;Stairs ADLs (physical): Bathing;Dressing;Toileting;IADLs Mobility Comments: household ambulator using RW ADLs Comments: Assisted by family for bathing dressing and toileting. Likely IADL assist as well.     Extremity/Trunk Assessment   Upper Extremity Assessment Upper Extremity Assessment: Defer to OT evaluation RUE Deficits / Details: 3+/5 shoulder flexion mmet; generally weak otherwise. Unsure of baseline weakenss level.    Lower Extremity Assessment Lower Extremity Assessment: Generalized weakness    Cervical / Trunk Assessment Cervical / Trunk Assessment: Normal  Communication   Communication Communication: Difficulty communicating thoughts/reduced clarity of speech Cueing Techniques: Verbal cues;Tactile cues  Cognition Arousal: Alert Behavior During Therapy: Anxious, Impulsive Overall Cognitive Status: Within Functional Limits for tasks assessed                                          General Comments      Exercises     Assessment/Plan    PT Assessment Patient needs continued PT services  PT Problem List Decreased strength;Decreased  activity tolerance;Decreased balance;Decreased mobility       PT Treatment Interventions DME instruction;Gait training;Stair training;Functional mobility training;Therapeutic activities;Therapeutic exercise;Balance training;Patient/family education    PT Goals (Current goals can be found in the Care Plan section)  Acute Rehab PT Goals Patient Stated Goal: return home with family to assist PT Goal Formulation: With patient Time For Goal Achievement: 06/25/23 Potential to Achieve Goals: Good    Frequency Min 3X/week     Co-evaluation PT/OT/SLP Co-Evaluation/Treatment: Yes Reason for Co-Treatment: To address functional/ADL transfers PT goals addressed during session: Mobility/safety with mobility;Balance;Proper use of DME OT goals addressed during session: ADL's and self-care       AM-PAC PT "6 Clicks" Mobility  Outcome Measure Help needed turning from your back to your side while in a flat bed without using bedrails?: None Help needed moving from lying on your back to sitting on the side of a flat bed without using bedrails?: A Little Help needed moving to and from a bed to a chair (including a wheelchair)?: A Little Help needed standing up from a chair using your arms (e.g., wheelchair or bedside chair)?: A Little Help needed to walk in hospital room?: A Little Help needed climbing 3-5 steps with a railing? : A Lot 6 Click Score: 18    End of Session Equipment  Utilized During Treatment: Oxygen Activity Tolerance: Patient tolerated treatment well;Patient limited by fatigue Patient left: in chair;with call bell/phone within reach;Other (comment) (left in wheelchair to be taken to procedure by nursing staff) Nurse Communication: Mobility status PT Visit Diagnosis: Unsteadiness on feet (R26.81);Other abnormalities of gait and mobility (R26.89);Muscle weakness (generalized) (M62.81)    Time: 1610-9604 PT Time Calculation (min) (ACUTE ONLY): 30 min   Charges:   PT  Evaluation $PT Eval Moderate Complexity: 1 Mod PT Treatments $Therapeutic Activity: 23-37 mins PT General Charges $$ ACUTE PT VISIT: 1 Visit         12:31 PM, 06/22/23 Ocie Bob, MPT Physical Therapist with [facility] [phone] office [phone] mobile phone

## 2023-06-23 ENCOUNTER — Observation Stay (HOSPITAL_BASED_OUTPATIENT_CLINIC_OR_DEPARTMENT_OTHER): Payer: Medicaid Other

## 2023-06-23 ENCOUNTER — Other Ambulatory Visit (HOSPITAL_COMMUNITY): Payer: Self-pay | Admitting: *Deleted

## 2023-06-23 DIAGNOSIS — I10 Essential (primary) hypertension: Secondary | ICD-10-CM

## 2023-06-23 DIAGNOSIS — J449 Chronic obstructive pulmonary disease, unspecified: Secondary | ICD-10-CM | POA: Diagnosis not present

## 2023-06-23 DIAGNOSIS — E1165 Type 2 diabetes mellitus with hyperglycemia: Secondary | ICD-10-CM | POA: Diagnosis not present

## 2023-06-23 DIAGNOSIS — R531 Weakness: Secondary | ICD-10-CM | POA: Diagnosis not present

## 2023-06-23 DIAGNOSIS — E782 Mixed hyperlipidemia: Secondary | ICD-10-CM | POA: Diagnosis not present

## 2023-06-23 LAB — RENAL FUNCTION PANEL
Albumin: 3 g/dL — ABNORMAL LOW (ref 3.5–5.0)
Anion gap: 6 (ref 5–15)
BUN: 31 mg/dL — ABNORMAL HIGH (ref 6–20)
CO2: 37 mmol/L — ABNORMAL HIGH (ref 22–32)
Calcium: 8.8 mg/dL — ABNORMAL LOW (ref 8.9–10.3)
Chloride: 100 mmol/L (ref 98–111)
Creatinine, Ser: 1.27 mg/dL — ABNORMAL HIGH (ref 0.44–1.00)
GFR, Estimated: 49 mL/min — ABNORMAL LOW (ref >60)
Glucose, Bld: 121 mg/dL — ABNORMAL HIGH (ref 70–99)
Phosphorus: 3.8 mg/dL (ref 2.5–4.6)
Potassium: 4.5 mmol/L (ref 3.5–5.1)
Sodium: 143 mmol/L (ref 135–145)

## 2023-06-23 LAB — ECHOCARDIOGRAM COMPLETE
AR max vel: 1.85 cm2
AV Area VTI: 1.84 cm2
AV Area mean vel: 1.9 cm2
AV Mean grad: 10 mm[Hg]
AV Peak grad: 19.5 mm[Hg]
Ao pk vel: 2.21 m/s
Area-P 1/2: 3.91 cm2
S' Lateral: 3.1 cm
Weight: 5435.66 [oz_av]

## 2023-06-23 LAB — CBC
HCT: 39.4 % (ref 36.0–46.0)
Hemoglobin: 11.1 g/dL — ABNORMAL LOW (ref 12.0–15.0)
MCH: 27.1 pg (ref 26.0–34.0)
MCHC: 28.2 g/dL — ABNORMAL LOW (ref 30.0–36.0)
MCV: 96.1 fL (ref 80.0–100.0)
Platelets: 166 10*3/uL (ref 150–400)
RBC: 4.1 MIL/uL (ref 3.87–5.11)
RDW: 14.9 % (ref 11.5–15.5)
WBC: 4.1 10*3/uL (ref 4.0–10.5)
nRBC: 0 % (ref 0.0–0.2)

## 2023-06-23 LAB — TSH: TSH: 1.088 u[IU]/mL (ref 0.350–4.500)

## 2023-06-23 LAB — GLUCOSE, CAPILLARY
Glucose-Capillary: 131 mg/dL — ABNORMAL HIGH (ref 70–99)
Glucose-Capillary: 166 mg/dL — ABNORMAL HIGH (ref 70–99)

## 2023-06-23 LAB — MAGNESIUM: Magnesium: 2.1 mg/dL (ref 1.7–2.4)

## 2023-06-23 NOTE — Progress Notes (Signed)
*  PRELIMINARY RESULTS* Echocardiogram 2D Echocardiogram has been performed.  Stacey Drain 06/23/2023, 11:14 AM

## 2023-06-23 NOTE — Plan of Care (Signed)
  Problem: Coping: Goal: Ability to adjust to condition or change in health will improve Outcome: Progressing   Problem: Fluid Volume: Goal: Ability to maintain a balanced intake and output will improve Outcome: Progressing   Problem: Skin Integrity: Goal: Risk for impaired skin integrity will decrease Outcome: Progressing   Problem: Education: Goal: Knowledge of disease or condition will improve Outcome: Progressing Goal: Knowledge of secondary prevention will improve (MUST DOCUMENT ALL) Outcome: Progressing Goal: Knowledge of patient specific risk factors will improve Loraine Leriche N/A or DELETE if not current risk factor) Outcome: Progressing   Problem: Ischemic Stroke/TIA Tissue Perfusion: Goal: Complications of ischemic stroke/TIA will be minimized Outcome: Progressing   Problem: Coping: Goal: Will verbalize positive feelings about self Outcome: Progressing Goal: Will identify appropriate support needs Outcome: Progressing   Problem: Health Behavior/Discharge Planning: Goal: Ability to manage health-related needs will improve Outcome: Progressing Goal: Goals will be collaboratively established with patient/family Outcome: Progressing   Problem: Self-Care: Goal: Ability to participate in self-care as condition permits will improve Outcome: Progressing Goal: Verbalization of feelings and concerns over difficulty with self-care will improve Outcome: Progressing Goal: Ability to communicate needs accurately will improve Outcome: Progressing   Problem: Nutrition: Goal: Risk of aspiration will decrease Outcome: Progressing

## 2023-06-23 NOTE — Discharge Summary (Signed)
Physician Discharge Summary  [name] ZOX:[id number] DOB: [birth year] DOA: 06/21/2023  PCP: [facility]  Admit date: 06/21/2023 Discharge date: 06/23/2023 Admitted From: Home Disposition: Home Recommendations for Outpatient Follow-up:  Follow up with PCP in 1 week Check CMP and CBC at follow-up Recommend outpatient sleep study to rule out sleep apnea Please follow up on the following pending results: None  Home Health: PT/OT Equipment/Devices: Patient has appropriate DME  Discharge Condition: Stable CODE STATUS: Full code  Follow-up Information     [facility] Follow up.   Why: Agency will call to set up first home visit.        [facility] Schedule an appointment as soon as possible for a visit in 1 week(s).   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  Reportedly dropping stuff from her right hand and stumbling due to right leg weakness.  Vital stable.  BMP and CBC without acute finding.  CT head, UA and UDS without acute finding.  MRI brain and echocardiogram ordered.  Patient was admitted for TIA/CVA workup   MRI brain and echocardiogram without acute finding.    LDL 59.  Apparently, patient had more strength on the right than left.  She has chronic left-sided weakness.  She also states that right-sided weakness is intermittent and short-lived.  TSH within normal. VBG with compensated hypercapnia likely contributing to patient's symptoms.  She has morbid obesity and at risk for OSA and OHS.  She uses 4 L oxygen at baseline.  I have encouraged patient to decrease oxygen to minimum level for goal saturation of about 90%.  Also recommended  outpatient sleep study to rule out sleep apnea  Home health PT/OT ordered as recommended by therapy.  See individual problem list below for more.   Problems addressed during this hospitalization Principal Problem:   Right sided weakness Active Problems:   Uncontrolled type 2 diabetes mellitus with hyperglycemia, with long-term current use of insulin (HCC)   Adjustment disorder with mixed disturbance of emotions and conduct   Essential hypertension   COPD (chronic obstructive pulmonary disease) (HCC)   Mixed hyperlipidemia   Stage 3a chronic kidney disease (CKD) (HCC)              Time spent 45 minutes  Vital signs Vitals:   06/23/23 0156 06/23/23 0528 06/23/23 0745 06/23/23 1244  BP: (!) 98/58 (!) 111/57 134/70 105/67  Pulse: 71 80 85 63  Temp: 98.5 F (36.9 C) 98.7 F (37.1 C) 98.4 F (36.9 C) 99.6 F (37.6 C)  Resp: 20 19 20  (!) 22  Weight:      SpO2: 97% 98% 94% 93%  TempSrc: Axillary Axillary Oral Oral     Discharge exam  GENERAL: No apparent distress.  Nontoxic. HEENT: MMM.  Vision and hearing grossly intact.  NECK: Supple.  No apparent JVD.  RESP:  No IWOB.  Fair aeration bilaterally. CVS:  RRR. Heart sounds normal.  ABD/GI/GU: BS+. Abd soft, NTND.  MSK/EXT:  Moves extremities. No apparent deformity. No edema.  SKIN: no apparent skin lesion or wound NEURO: Awake and alert. Oriented appropriately.  No apparent focal neuro deficit. PSYCH: Calm. Normal  Physician Discharge Summary  [name] ZOX:[id number] DOB: [birth year] DOA: 06/21/2023  PCP: [facility]  Admit date: 06/21/2023 Discharge date: 06/23/2023 Admitted From: Home Disposition: Home Recommendations for Outpatient Follow-up:  Follow up with PCP in 1 week Check CMP and CBC at follow-up Recommend outpatient sleep study to rule out sleep apnea Please follow up on the following pending results: None  Home Health: PT/OT Equipment/Devices: Patient has appropriate DME  Discharge Condition: Stable CODE STATUS: Full code  Follow-up Information     [facility] Follow up.   Why: Agency will call to set up first home visit.        [facility] Schedule an appointment as soon as possible for a visit in 1 week(s).   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  Reportedly dropping stuff from her right hand and stumbling due to right leg weakness.  Vital stable.  BMP and CBC without acute finding.  CT head, UA and UDS without acute finding.  MRI brain and echocardiogram ordered.  Patient was admitted for TIA/CVA workup   MRI brain and echocardiogram without acute finding.    LDL 59.  Apparently, patient had more strength on the right than left.  She has chronic left-sided weakness.  She also states that right-sided weakness is intermittent and short-lived.  TSH within normal. VBG with compensated hypercapnia likely contributing to patient's symptoms.  She has morbid obesity and at risk for OSA and OHS.  She uses 4 L oxygen at baseline.  I have encouraged patient to decrease oxygen to minimum level for goal saturation of about 90%.  Also recommended  outpatient sleep study to rule out sleep apnea  Home health PT/OT ordered as recommended by therapy.  See individual problem list below for more.   Problems addressed during this hospitalization Principal Problem:   Right sided weakness Active Problems:   Uncontrolled type 2 diabetes mellitus with hyperglycemia, with long-term current use of insulin (HCC)   Adjustment disorder with mixed disturbance of emotions and conduct   Essential hypertension   COPD (chronic obstructive pulmonary disease) (HCC)   Mixed hyperlipidemia   Stage 3a chronic kidney disease (CKD) (HCC)              Time spent 45 minutes  Vital signs Vitals:   06/23/23 0156 06/23/23 0528 06/23/23 0745 06/23/23 1244  BP: (!) 98/58 (!) 111/57 134/70 105/67  Pulse: 71 80 85 63  Temp: 98.5 F (36.9 C) 98.7 F (37.1 C) 98.4 F (36.9 C) 99.6 F (37.6 C)  Resp: 20 19 20  (!) 22  Weight:      SpO2: 97% 98% 94% 93%  TempSrc: Axillary Axillary Oral Oral     Discharge exam  GENERAL: No apparent distress.  Nontoxic. HEENT: MMM.  Vision and hearing grossly intact.  NECK: Supple.  No apparent JVD.  RESP:  No IWOB.  Fair aeration bilaterally. CVS:  RRR. Heart sounds normal.  ABD/GI/GU: BS+. Abd soft, NTND.  MSK/EXT:  Moves extremities. No apparent deformity. No edema.  SKIN: no apparent skin lesion or wound NEURO: Awake and alert. Oriented appropriately.  No apparent focal neuro deficit. PSYCH: Calm. Normal  affect.   Discharge Instructions Discharge Instructions     Diet - low sodium heart healthy   Complete by: As directed    Diet Carb Modified   Complete by: As directed    Discharge instructions   Complete by: As directed    It has been a pleasure taking care of you!  You were hospitalized due to intermittent right-sided weakness.  We believe this is related to high carbon dioxide from high oxygen use.  CT and MRI of your brain did not show stroke.  We strongly recommend using minimum oxygen to keep  saturation above 88%.  You may get pulseox to check you oxygen level. Please follow-up with your primary care doctor in 1 to 2 weeks or sooner if needed.   Take care,   Increase activity slowly   Complete by: As directed       Allergies as of 06/23/2023       Reactions   Bee Venom Hives        Medication List     TAKE these medications    aspirin EC 81 MG tablet Take 81 mg by mouth daily.   FLUoxetine 20 MG capsule Commonly known as: PROZAC Take 20 mg by mouth daily.   insulin aspart 100 UNIT/ML injection Commonly known as: novoLOG CBG < 70: implement hypoglycemia protocol CBG 70 - 120: 0 units CBG 121 - 150: 3 units CBG 151 - 200: 4 units CBG 201 - 250: 7 units CBG 251 - 300: 11 units CBG 301 - 350: 15 units CBG 351 - 400: 20 units What changed:  how much to take how to take this when to take this additional instructions   isosorbide mononitrate 30 MG 24 hr tablet Commonly known as: IMDUR Take 1 tablet (30 mg total) by mouth daily.   Lantus SoloStar 100 UNIT/ML Solostar Pen Generic drug: insulin glargine Inject 55 Units into the skin 2 (two) times daily.   lisinopril 2.5 MG tablet Commonly known as: ZESTRIL Take 2.5 mg by mouth daily.   loratadine 10 MG tablet Commonly known as: CLARITIN Take 10 mg by mouth daily.   Myrbetriq 25 MG Tb24 tablet Generic drug: mirabegron ER Take 25 mg by mouth daily.   OLANZapine 10 MG tablet Commonly known as: ZYPREXA Take 1 tablet (10 mg total) by mouth at bedtime.   Oxcarbazepine 300 MG tablet Commonly known as: TRILEPTAL Take 1 tablet (300 mg total) by mouth 2 (two) times daily.   propranolol 60 MG tablet Commonly known as: INDERAL Take 1 tablet (60 mg total) by mouth daily. On an empty stomach   rosuvastatin 20 MG tablet Commonly known as: CRESTOR Take 20 mg by mouth daily.   Vitamin D3 50 MCG (2000 UT) capsule Take 2,000 Units by mouth daily.         Consultations: None  Procedures/Studies:   ECHOCARDIOGRAM COMPLETE  Result Date: 06/23/2023    ECHOCARDIOGRAM REPORT   Patient Name:   CHERISA BRUCKER Date of Exam: 06/23/2023 Medical Rec #:  [id number]       Height:       67.0 in Accession #:    [id number]      Weight:       339.7 lb Date of Birth:  [birth year]       BSA:          2.533 m Patient Age:    57 years        BP:  Physician Discharge Summary  [name] ZOX:[id number] DOB: [birth year] DOA: 06/21/2023  PCP: [facility]  Admit date: 06/21/2023 Discharge date: 06/23/2023 Admitted From: Home Disposition: Home Recommendations for Outpatient Follow-up:  Follow up with PCP in 1 week Check CMP and CBC at follow-up Recommend outpatient sleep study to rule out sleep apnea Please follow up on the following pending results: None  Home Health: PT/OT Equipment/Devices: Patient has appropriate DME  Discharge Condition: Stable CODE STATUS: Full code  Follow-up Information     [facility] Follow up.   Why: Agency will call to set up first home visit.        [facility] Schedule an appointment as soon as possible for a visit in 1 week(s).   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  Reportedly dropping stuff from her right hand and stumbling due to right leg weakness.  Vital stable.  BMP and CBC without acute finding.  CT head, UA and UDS without acute finding.  MRI brain and echocardiogram ordered.  Patient was admitted for TIA/CVA workup   MRI brain and echocardiogram without acute finding.    LDL 59.  Apparently, patient had more strength on the right than left.  She has chronic left-sided weakness.  She also states that right-sided weakness is intermittent and short-lived.  TSH within normal. VBG with compensated hypercapnia likely contributing to patient's symptoms.  She has morbid obesity and at risk for OSA and OHS.  She uses 4 L oxygen at baseline.  I have encouraged patient to decrease oxygen to minimum level for goal saturation of about 90%.  Also recommended  outpatient sleep study to rule out sleep apnea  Home health PT/OT ordered as recommended by therapy.  See individual problem list below for more.   Problems addressed during this hospitalization Principal Problem:   Right sided weakness Active Problems:   Uncontrolled type 2 diabetes mellitus with hyperglycemia, with long-term current use of insulin (HCC)   Adjustment disorder with mixed disturbance of emotions and conduct   Essential hypertension   COPD (chronic obstructive pulmonary disease) (HCC)   Mixed hyperlipidemia   Stage 3a chronic kidney disease (CKD) (HCC)              Time spent 45 minutes  Vital signs Vitals:   06/23/23 0156 06/23/23 0528 06/23/23 0745 06/23/23 1244  BP: (!) 98/58 (!) 111/57 134/70 105/67  Pulse: 71 80 85 63  Temp: 98.5 F (36.9 C) 98.7 F (37.1 C) 98.4 F (36.9 C) 99.6 F (37.6 C)  Resp: 20 19 20  (!) 22  Weight:      SpO2: 97% 98% 94% 93%  TempSrc: Axillary Axillary Oral Oral     Discharge exam  GENERAL: No apparent distress.  Nontoxic. HEENT: MMM.  Vision and hearing grossly intact.  NECK: Supple.  No apparent JVD.  RESP:  No IWOB.  Fair aeration bilaterally. CVS:  RRR. Heart sounds normal.  ABD/GI/GU: BS+. Abd soft, NTND.  MSK/EXT:  Moves extremities. No apparent deformity. No edema.  SKIN: no apparent skin lesion or wound NEURO: Awake and alert. Oriented appropriately.  No apparent focal neuro deficit. PSYCH: Calm. Normal  affect.   Discharge Instructions Discharge Instructions     Diet - low sodium heart healthy   Complete by: As directed    Diet Carb Modified   Complete by: As directed    Discharge instructions   Complete by: As directed    It has been a pleasure taking care of you!  You were hospitalized due to intermittent right-sided weakness.  We believe this is related to high carbon dioxide from high oxygen use.  CT and MRI of your brain did not show stroke.  We strongly recommend using minimum oxygen to keep  saturation above 88%.  You may get pulseox to check you oxygen level. Please follow-up with your primary care doctor in 1 to 2 weeks or sooner if needed.   Take care,   Increase activity slowly   Complete by: As directed       Allergies as of 06/23/2023       Reactions   Bee Venom Hives        Medication List     TAKE these medications    aspirin EC 81 MG tablet Take 81 mg by mouth daily.   FLUoxetine 20 MG capsule Commonly known as: PROZAC Take 20 mg by mouth daily.   insulin aspart 100 UNIT/ML injection Commonly known as: novoLOG CBG < 70: implement hypoglycemia protocol CBG 70 - 120: 0 units CBG 121 - 150: 3 units CBG 151 - 200: 4 units CBG 201 - 250: 7 units CBG 251 - 300: 11 units CBG 301 - 350: 15 units CBG 351 - 400: 20 units What changed:  how much to take how to take this when to take this additional instructions   isosorbide mononitrate 30 MG 24 hr tablet Commonly known as: IMDUR Take 1 tablet (30 mg total) by mouth daily.   Lantus SoloStar 100 UNIT/ML Solostar Pen Generic drug: insulin glargine Inject 55 Units into the skin 2 (two) times daily.   lisinopril 2.5 MG tablet Commonly known as: ZESTRIL Take 2.5 mg by mouth daily.   loratadine 10 MG tablet Commonly known as: CLARITIN Take 10 mg by mouth daily.   Myrbetriq 25 MG Tb24 tablet Generic drug: mirabegron ER Take 25 mg by mouth daily.   OLANZapine 10 MG tablet Commonly known as: ZYPREXA Take 1 tablet (10 mg total) by mouth at bedtime.   Oxcarbazepine 300 MG tablet Commonly known as: TRILEPTAL Take 1 tablet (300 mg total) by mouth 2 (two) times daily.   propranolol 60 MG tablet Commonly known as: INDERAL Take 1 tablet (60 mg total) by mouth daily. On an empty stomach   rosuvastatin 20 MG tablet Commonly known as: CRESTOR Take 20 mg by mouth daily.   Vitamin D3 50 MCG (2000 UT) capsule Take 2,000 Units by mouth daily.         Consultations: None  Procedures/Studies:   ECHOCARDIOGRAM COMPLETE  Result Date: 06/23/2023    ECHOCARDIOGRAM REPORT   Patient Name:   CHERISA BRUCKER Date of Exam: 06/23/2023 Medical Rec #:  [id number]       Height:       67.0 in Accession #:    [id number]      Weight:       339.7 lb Date of Birth:  [birth year]       BSA:          2.533 m Patient Age:    57 years        BP:  affect.   Discharge Instructions Discharge Instructions     Diet - low sodium heart healthy   Complete by: As directed    Diet Carb Modified   Complete by: As directed    Discharge instructions   Complete by: As directed    It has been a pleasure taking care of you!  You were hospitalized due to intermittent right-sided weakness.  We believe this is related to high carbon dioxide from high oxygen use.  CT and MRI of your brain did not show stroke.  We strongly recommend using minimum oxygen to keep  saturation above 88%.  You may get pulseox to check you oxygen level. Please follow-up with your primary care doctor in 1 to 2 weeks or sooner if needed.   Take care,   Increase activity slowly   Complete by: As directed       Allergies as of 06/23/2023       Reactions   Bee Venom Hives        Medication List     TAKE these medications    aspirin EC 81 MG tablet Take 81 mg by mouth daily.   FLUoxetine 20 MG capsule Commonly known as: PROZAC Take 20 mg by mouth daily.   insulin aspart 100 UNIT/ML injection Commonly known as: novoLOG CBG < 70: implement hypoglycemia protocol CBG 70 - 120: 0 units CBG 121 - 150: 3 units CBG 151 - 200: 4 units CBG 201 - 250: 7 units CBG 251 - 300: 11 units CBG 301 - 350: 15 units CBG 351 - 400: 20 units What changed:  how much to take how to take this when to take this additional instructions   isosorbide mononitrate 30 MG 24 hr tablet Commonly known as: IMDUR Take 1 tablet (30 mg total) by mouth daily.   Lantus SoloStar 100 UNIT/ML Solostar Pen Generic drug: insulin glargine Inject 55 Units into the skin 2 (two) times daily.   lisinopril 2.5 MG tablet Commonly known as: ZESTRIL Take 2.5 mg by mouth daily.   loratadine 10 MG tablet Commonly known as: CLARITIN Take 10 mg by mouth daily.   Myrbetriq 25 MG Tb24 tablet Generic drug: mirabegron ER Take 25 mg by mouth daily.   OLANZapine 10 MG tablet Commonly known as: ZYPREXA Take 1 tablet (10 mg total) by mouth at bedtime.   Oxcarbazepine 300 MG tablet Commonly known as: TRILEPTAL Take 1 tablet (300 mg total) by mouth 2 (two) times daily.   propranolol 60 MG tablet Commonly known as: INDERAL Take 1 tablet (60 mg total) by mouth daily. On an empty stomach   rosuvastatin 20 MG tablet Commonly known as: CRESTOR Take 20 mg by mouth daily.   Vitamin D3 50 MCG (2000 UT) capsule Take 2,000 Units by mouth daily.         Consultations: None  Procedures/Studies:   ECHOCARDIOGRAM COMPLETE  Result Date: 06/23/2023    ECHOCARDIOGRAM REPORT   Patient Name:   CHERISA BRUCKER Date of Exam: 06/23/2023 Medical Rec #:  [id number]       Height:       67.0 in Accession #:    [id number]      Weight:       339.7 lb Date of Birth:  [birth year]       BSA:          2.533 m Patient Age:    57 years        BP:

## 2023-06-23 NOTE — Progress Notes (Addendum)
Patient discharged home with instructions given on medications and follow up visits, patient and family verbalized understanding. Prescriptions sent to Pharmacy of choice documented on AVS. IV discontinued,catheter intact. Oxygen at 3 liters via nasal canula.Accompanied by staff to an awaiting vehicle.

## 2023-06-23 NOTE — TOC Transition Note (Signed)
Transition of Care [facility]) - CM/SW Discharge Note   Patient Details  Name: [name] MRN: [id number] Date of Birth: 1966/01/02  Transition of Care [facility]) CM/SW Contact:  Catalina Gravel, LCSW Phone Number: 06/23/2023, 9:53 AM   Clinical Narrative:     Pt cleared for DC. CSW contacted [facility] advising that orders in and pt DC today.  No further TOC needs.     Barriers to Discharge: No Barriers Identified   Patient Goals and CMS Choice CMS Medicare.gov Compare Post Acute Care list provided to:: Patient Choice offered to / list presented to : Patient  Discharge Placement                         Discharge Plan and Services Additional resources added to the After Visit Summary for   In-house Referral: Clinical Social Work Discharge Planning Services: CM Consult Post Acute Care Choice: Home Health                    HH Arranged: PT, OT [facility] Agency: [facility] Date [facility] Agency Contacted: 06/22/23   Representative spoke with at [facility] Agency: Morrie Sheldon  Social Determinants of Health (SDOH) Interventions SDOH Screenings   Food Insecurity: Food Insecurity Present (06/21/2023)  Housing: Low Risk  (06/21/2023)  Recent Concern: Housing - High Risk (04/01/2023)  Transportation Needs: No Transportation Needs (06/21/2023)  Utilities: Not At Risk (06/21/2023)  Tobacco Use: Low Risk  (04/15/2023)     Readmission Risk Interventions    04/16/2023    4:15 PM 04/02/2023    1:43 PM 04/25/2021    1:27 PM  Readmission Risk Prevention Plan  Transportation Screening Complete Complete Complete  Home Care Screening   Complete  Medication Review (RN CM)   Complete  HRI or Home Care Consult Complete Complete   Social Work Consult for Recovery Care Planning/Counseling Complete Complete   Palliative Care Screening Not Applicable Not Applicable   Medication Review Oceanographer) Complete Complete

## 2023-06-23 NOTE — Progress Notes (Signed)
SLP Cancellation Note  Patient Details Name: [name] MRN: [id number] DOB: 31-Oct-1965   Cancelled treatment:       Reason Eval/Treat Not Completed: SLP screened, no needs identified, will sign off. MRI reveals "No acute intracranial abnormality or mass." Pt's speech, language and cognition are functioning at baseline. Thank you for this referral,   [name], [name] Speech Language Pathologist    [name] 06/23/2023, 12:18 PM

## 2023-06-25 LAB — URINE CULTURE

## 2023-07-27 DEATH — deceased

## 2023-08-26 DEATH — deceased
